# Patient Record
Sex: Female | Born: 1940
Health system: Southern US, Community
[De-identification: ages and names within clinical notes are randomized; demographics above are authoritative.]

## PROBLEM LIST (undated history)

## (undated) DIAGNOSIS — C801 Malignant (primary) neoplasm, unspecified: Secondary | ICD-10-CM

## (undated) DIAGNOSIS — K861 Other chronic pancreatitis: Secondary | ICD-10-CM

## (undated) DIAGNOSIS — K5900 Constipation, unspecified: Secondary | ICD-10-CM

## (undated) DIAGNOSIS — M8580 Other specified disorders of bone density and structure, unspecified site: Secondary | ICD-10-CM

## (undated) DIAGNOSIS — M1990 Unspecified osteoarthritis, unspecified site: Secondary | ICD-10-CM

## (undated) DIAGNOSIS — I4891 Unspecified atrial fibrillation: Secondary | ICD-10-CM

## (undated) DIAGNOSIS — K859 Acute pancreatitis without necrosis or infection, unspecified: Secondary | ICD-10-CM

## (undated) DIAGNOSIS — Z807 Family history of other malignant neoplasms of lymphoid, hematopoietic and related tissues: Secondary | ICD-10-CM

## (undated) HISTORY — PX: JOINT REPLACEMENT: SHX530

## (undated) HISTORY — DX: Constipation, unspecified: K59.00

## (undated) HISTORY — DX: Unspecified atrial fibrillation: I48.91

## (undated) HISTORY — DX: Unspecified osteoarthritis, unspecified site: M19.90

## (undated) HISTORY — DX: Other chronic pancreatitis: K86.1

## (undated) HISTORY — DX: Acute pancreatitis without necrosis or infection, unspecified: K85.90

## (undated) HISTORY — DX: Other specified disorders of bone density and structure, unspecified site: M85.80

## (undated) HISTORY — DX: Family history of other malignant neoplasms of lymphoid, hematopoietic and related tissues: Z80.7

---
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
## 1979-06-30 HISTORY — PX: CYSTECTOMY: SUR359

## 1988-06-29 HISTORY — PX: MANDIBLE FRACTURE SURGERY: SHX706

## 1996-06-29 HISTORY — PX: CHOLECYSTECTOMY: SHX55

## 2001-02-10 ENCOUNTER — Inpatient Hospital Stay (HOSPITAL_COMMUNITY): Admission: EM | Admit: 2001-02-10 | Discharge: 2001-02-13 | Payer: Self-pay | Admitting: Emergency Medicine

## 2001-02-18 ENCOUNTER — Encounter: Admission: RE | Admit: 2001-02-18 | Discharge: 2001-02-18 | Payer: Self-pay | Admitting: Family Medicine

## 2004-12-06 ENCOUNTER — Emergency Department (HOSPITAL_COMMUNITY): Admission: EM | Admit: 2004-12-06 | Discharge: 2004-12-06 | Payer: Self-pay | Admitting: Emergency Medicine

## 2007-09-14 ENCOUNTER — Other Ambulatory Visit: Admission: RE | Admit: 2007-09-14 | Discharge: 2007-09-14 | Payer: Self-pay | Admitting: Family Medicine

## 2007-10-05 ENCOUNTER — Encounter: Admission: RE | Admit: 2007-10-05 | Discharge: 2007-10-05 | Payer: Self-pay | Admitting: Gastroenterology

## 2007-10-27 ENCOUNTER — Ambulatory Visit (HOSPITAL_COMMUNITY): Admission: RE | Admit: 2007-10-27 | Discharge: 2007-10-27 | Payer: Self-pay | Admitting: Gastroenterology

## 2007-10-27 ENCOUNTER — Encounter: Payer: Self-pay | Admitting: Gastroenterology

## 2007-10-31 ENCOUNTER — Ambulatory Visit: Payer: Self-pay | Admitting: Gastroenterology

## 2007-11-18 ENCOUNTER — Encounter: Admission: RE | Admit: 2007-11-18 | Discharge: 2007-11-18 | Payer: Self-pay | Admitting: Gastroenterology

## 2008-02-23 ENCOUNTER — Encounter: Admission: RE | Admit: 2008-02-23 | Discharge: 2008-02-23 | Payer: Self-pay | Admitting: Gastroenterology

## 2008-10-26 ENCOUNTER — Encounter: Admission: RE | Admit: 2008-10-26 | Discharge: 2008-10-26 | Payer: Self-pay | Admitting: Gastroenterology

## 2010-07-20 ENCOUNTER — Encounter: Payer: Self-pay | Admitting: Gastroenterology

## 2010-11-14 NOTE — Consult Note (Signed)
NAMEMIAH, BOYE NO.:  [id number]   MEDICAL RECORD NO.:  [id number]          PATIENT TYPE:  EMS   LOCATION:  [facility]                         FACILITY:  [facility]   PHYSICIAN:  Vesta Mixer, M.D. DATE OF BIRTH:  11/25/1940   DATE OF CONSULTATION:  DATE OF DISCHARGE:                                   CONSULTATION   DATE OF EMERGENCY ROOM CONSULTATION:  December 07, 2004.   HISTORY:  Mrs. Swinson is a 70 year old female with a history of  palpitations in the past.  She otherwise does not have any specific cardiac  abnormalities.  She presented to the emergency room this morning with  palpitations.  She was found to have a rapid atrial fibrillation, and we are  asked to consult.   The patient received an IV bolus of Cardizem in the emergency room and  converted to sinus bradycardia.  She feels quite well and has not had any  episodes of chest pain or shortness of breath.   The patient describes having palpitations over the past several years.  The  palpitations that she describes sound typically like premature ventricular  contractions.  This episode was somewhat different in that it was sustained.  She awoke with this and did not have any chest pain, shortness of breath,  diaphoresis, syncope, or presyncope.  She came to the emergency room and was  found to have a rapid atrial fibrillation with a rapid ventricular response.  She was given 20 mg of IV Cardizem and converted to sinus bradycardia.  She  feels quite well now and has not had any problems.   CURRENT MEDICATIONS:  1.  Multivitamins once a day.  2.  Claritin once a day.   ALLERGIES:  No known drug allergies.   PAST MEDICAL HISTORY:  PVCs.   SOCIAL HISTORY:  The patient has a history of cigarette smoking in the  remote past.   FAMILY HISTORY:  Positive for hypertension.   REVIEW OF SYSTEMS:  Reviewed and is essentially negative.   PHYSICAL EXAMINATION:  GENERAL:  She is an elderly female in no  acute  distress.  VITAL SIGNS:  Her heart rate is 52.  Her blood pressure is 108/70.  HEENT:  2+ carotids.  She has no JVD, no thyromegaly.  LUNGS:  Clear to auscultation.  HEART:  Regular rate, S1 and S2, and is bradycardic.  She has a very soft,  systolic, outflow murmur.  ABDOMEN:  Good bowel sounds and is nontender.  EXTREMITIES:  No clubbing, cyanosis, or edema.  NEURO:  Nonfocal.   ELECTROCARDIOGRAM:  Her first EKG reveals atrial fibrillation with a rapid  ventricular response.  Her second EKG reveals sinus bradycardia with PVCs.  Otherwise, there are no ST or T-wave changes.   LABORATORY DATA:  Essentially negative.  Her point-of-care markers are  negative.  Her CBC and her Chem-7 are unremarkable.   IMPRESSION AND PLAN:  Atrial fibrillation:  The patient presents with  intermittent atrial fibrillation.  She converted quite easily with a  Cardizem bolus.  We will give her a prescription for  propranolol to use on  an as needed basis in case she has more episodes of atrial fibrillation.  Her heart rate is fairly slow, and I do not want to place her on a long-  acting beta blocker. I have asked her to call Dr. Lindell Spar office for a  followup appointment this week.  She is to call us back sooner if she has  any problems over the weekend.       PJN/MEDQ  D:  12/06/2004  T:  12/06/2004  Job:  [id number]   cc:   Dellis Anes. Idell Pickles, M.D.  [address]  [place]  [place] [address]  Fax: [phone]   Candace Cruise, M.D.

## 2010-11-14 NOTE — H&P (Signed)
[place]. [facility]  Patient:    [name], [name]                      MRN: [id number] Adm. Date:  [id number] Attending:  Tobin Chad Dictator:   Clance Boll, M.D. CC:         Urgent Care   History and Physical  DATE OF BIRTH:  05-10-1941  PRIMARY CARE PHYSICIAN:  Urgent Care.  PROBLEM LIST: 1. Acute dyspnea with palpitations. 2. Nausea and vomiting. 3. Intermittent bradycardia. 4. Hypokalemia. 5. External hemorrhoids with bright red blood. 6. Left shoulder pain with recent new medications (prednisone, Bextra, and    Vicodin). 7. Status post cholecystectomy.  HISTORY OF PRESENT ILLNESS:  This is a 70 year old white female who presents to the emergency room with complaint of gradual onset shortness of breath and light headedness at approximately 11:30 a.m. this morning associated with palpitations and diaphoresis. Worsening by exertion. Also with epigastric fullness and bloating. Denies chest pain. She called 911, and EMS noted normal EKG and left. The patient then drove with husband to [facility] [place]. She continued with worsening shortness of breath but also with increasing nausea and vomiting. EKG at Urgent Care noted frequent PVCs and possible ST depression in the inferior leads, and she was sent to [facility] for evaluation. She reports severe shortness of breath, nausea, light headedness, palpitations upon arrival to ED with epigastric "fullness" and "perhaps mild chest pressure" that was completely relieved with sublingual nitroglycerin x 1 and Phenergan 12.5 mg IV. She currently denies chest pain. The patient reports symptoms this morning were significantly worse with exertion and with laying flat. She denies cough, fevers or chills, diarrhea, or change in bowel movements. She reports epigastric bloating for the past several days and a history of intermittent bouts of shortness of breath that quickly  resolve on their own. No chest pain. No lower extremity swelling. No orthopnea. No PND.  PAST MEDICAL HISTORY:  See HPI.  PAST SURGICAL HISTORY:  Cholecystectomy.  CURRENT MEDICATIONS: 1. Prednisone 2. Bextra. 3. Vicodin. Her last dose yesterday afternoon. 4. Multivitamin. 5. Glucosamine. 6. MSM.  ALLERGIES:  None.  SOCIAL HISTORY:  She lives with her husband. Denies tobacco or alcohol or drug use. 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  FAMILY HISTORY:  Significant for her dad who died of Hodgkins lymphoma at 57 and multiple family members with hypertension. No early deaths from coronary disease. No early MIs.  REVIEW OF SYSTEMS:  She does note occasional bright red blood per rectum that she relates to her hemorrhoids. She did have a flexible sigmoidoscopy done five years ago. No melena. No dysuria. No abdominal pain. No back pain. No calf pain. No recent long trips. She does note nocturia, three to four times per night. No change in weight. No fevers or chills. No reflux.  PHYSICAL EXAMINATION:  VITAL SIGNS:  Temperature 96.5, pulse ranges from 45 to 66, blood pressure 100 to 120 systolic over 60 to 80 diastolic, respirations 18, O2 saturation is 98% on 2 L.  GENERAL:  On arrival, she was diaphoretic, pale, in moderate respiratory distress. After sublingual nitroglycerin x 1, she was sitting up, comfortable, interactive, in no distress.  HEENT:  PERRLA. EOMI. Nares patent. OP without erythema or exudate.  NECK:  Supple. No LAD. No TM. No JVD. No carotid bruits.  CARDIOVASCULAR:  Regular rate and rhythm.  No murmur, gallop, or rub.  LUNGS:  Clear to auscultation bilaterally with adequate aeration. No accessory muscle use.  ABDOMEN:  Soft, obese. ______ . No hepatosplenomegaly.  EXTREMITIES:  Trace edema to the shin bilaterally. There are 2+ PT and DP pulses bilaterally.  RECTAL:  Positive external hemorrhoids that are moderately  tender with trace bright red blood per rectum. Normal sphincter tone.  LABORATORY DATA:  White blood cell count 9.6, hemoglobin 13.7, hematocrit 39.4, platelets 212. Sodium 140, potassium 3.3, chloride 108, bicarb 24, BUN 20, creatinine 0.8, glucose 112, calcium 8.9, albumin 3.7, AST 29, ALT 27, alkaline phosphatase 62, total bilirubin 0.4. CK 93, MB 1.6, troponin I 0.01. ABG:  7.44, pCO2 33, pO2 94, bicarb 22, saturation 98% on room air.  EKG shows LAD with frequent PVCs. No acute ST or T wave changes.  Chest x-ray shows cardiomegaly and mild pulmonary edema.  ASSESSMENT AND PLAN:  This is a 70 year old white female with acute shortness of breath, palpitations, diaphoresis, and chest fullness.  1. Shortness of breath. Symptoms are concerning for ischemia especially since    it was relieved with sublingual nitroglycerin. However, she does have    normal enzymes and normal EKG. ABG demonstrates mild hyperventilation but    is otherwise normal. We will admit to telemetry. Rule out for myocardial    infarction. Start aspirin a day. Check D-dimer and lower extremity Dopplers    to rule out PE. The patient could have had fluid retention from prednisone    and Bextra. This may also represent an esophageal spasm or panic attack.    However, we will discontinued prednisone and Bextra in the meantime. 2. Bright red blood per rectum. Likely secondary to hemorrhoids. We will give    Protocort HC cream, Tucks wipes for comfort. The patient has had a flexible    sigmoidoscopy in the past five years and a normal hemoglobin. She can have    further GI workup as an outpatient if necessary because of her age. 3. Hypokalemia. Likely secondary to prednisone. We will give p.o. replacement.DD:  02/10/01 TD:  02/10/01 Job: [id number]

## 2010-11-14 NOTE — Consult Note (Signed)
[place]. [facility]  Patient:    [name], [name]                      MRN: [id number] Adm. Date:  [id number] Attending:  Tobin Chad                          Consultation Report  REFERRING PHYSICIAN:  [facility] Teaching Service.  REASON FOR CONSULTATION:  Acute shortness of breath associated with substernal chest pressure.  HISTORY OF PRESENT ILLNESS:  Ms. Gresham is a 70 year old female with no significant risk factors for coronary artery disease.  The patient was seen in the urgent care center today due to the rather sudden onset of shortness of breath and lightheadedness this morning.  This was associated with palpitations and some diaphoresis.  The patient later in the morning also then experienced chest tightness, which worsened her shortness of breath.  Of note, the patient has been under a lot of stress recently, particularly because of a bad report that her husband got regarding his kidneys.  However, the patient was concerned and called 911.  She was found to have a normal electrocardiogram by EMS.  Subsequently the patient presents herself to the urgent center at [facility].  She also reports nausea and vomiting, which was felt to be secondary to Vicodin, Bextra, and prednisone she had been using for osteoarthritis.  The patient was then referred to [facility] for further evaluation.  The patient is currently pain-free.  She reports lightheadedness when sitting up.  However, she has no shortness of breath at rest but does become dyspneic on minimal exertion.  She has no orthopnea or PND.  PAST MEDICAL HISTORY: 1. History of cholecystectomy. 2. History of decreased exercise tolerance. 3. History of left shoulder osteoarthritis, recently treated with prednisone,    Bextra, and Vicodin.  ALLERGIES:  No known drug allergies.  SOCIAL HISTORY:  She lives with her husband.  She does not drink or does not use tobacco.  She  quit in 1974.  FAMILY HISTORY:  Notable for hypertension.  MEDICATIONS:  Prednisone, Bextra, Vicodin, multivitamin.  REVIEW OF SYSTEMS:  As per HPI.  Decreased exercise tolerance, _____ class 2. Occasional palpitations, lightheadedness, but no frank syncope.  PHYSICAL EXAMINATION:  VITAL SIGNS:  Blood pressure is 120/60, heart rate 50-60 beats per minute, respirations are 18.  Room air saturation is 98%.  GENERAL:  A well-nourished white female in no apparent distress, somewhat pale-appearing.  HEENT:  Pupils isocoric.  Conjunctivae clear.  NECK:  Supple, no JVD or abdominal jugular reflux.  No carotid bruits.  CHEST:  Clear breath sounds bilaterally.  CARDIAC:  Regular rate and rhythm, bradycardic.  Normal S1 and S2.  No murmurs, rubs, or gallops.  ABDOMEN:  Soft, nontender, but distended with borborygmi.  EXTREMITIES:  2+ pulses.  There is no cyanosis, clubbing, or edema.  NEUROLOGIC:  The patient is alert, oriented, and grossly nonfocal.  LABORATORY DATA:  Hemoglobin is 13.7, white count is 9.6.  Potassium is 3.8, creatinine is 0.8, BUN is 20.  First CK is 93 with MB of 1.6, troponin 0.01. Second set, 110 with a CK-MB of 5.8 and a relative index of 5.3, troponin is 0.03.  D-dimer is 0.79.  Twelve-lead electrocardiogram:  Sinus bradycardia with left axis deviation but no acute ischemic changes, frequent PVCs.  Chest x-ray reported as cardiomegaly with mild congestive heart failure.  IMPRESSION AND PLAN: 1. Shortness of breath.  Patients symptoms of shortness of breath are rather    acute.  This could be well due to hyperventilation and the recent stress    she experienced in her life.  The patient has very few risk factors for    coronary artery disease.  However, her symptoms of substernal chest pain    and exertional dyspnea are concerning for ischemic heart disease.  If the    third set of troponins is noted for further elevation, I would recommend to    proceed  with a cardiac catheterization.  Furthermore, if her mild pulmonary    edema by chest x-ray is confirmed with an elevated BNP level, this would be    another indication to proceed with cardiac catheterization.  The patient is    very reluctant to do this, and I had a long discussion with her about the    risk and benefit of a cardiac catheterization.  However, she is in    agreement that if we have further evidence that she has an acute coronary    syndrome, she is planning to proceed with this procedure.  If enzymes    remain negative and her BNP level is below 100, I do feel we can proceed    with adenosine Cardiolite study in the morning.  I agree with aspirin and    Lovenox at this point in time.  If the patients ischemia is negative,    consideration could be given to a pulmonary embolism, although I think her    pre-test probability is rather low for significant pulmonary embolus.  I    agree that if she has negative lower extremity Dopplers, this would be an    unlikely presentation of pulmonary embolism. 2. Bradycardia.  This is likely a vagal reaction, particularly with the    patients complaints of nausea.  I am also not certain that the patient    does not have a gastritis or some other cause for gastrointestinal    pathology that is giving her vagal symptoms.  She should be treated with    simethicone as well as anitemetics for tonight.  DISPOSITION:  Will follow up results of cardiac enzymes and BNP, and decisions will be based on further resolution of the patients symptoms whether to proceed with the cardiac catheterization or a noninvasive stress test. DD:  02/11/01 TD:  02/11/01 Job: [id number]

## 2010-11-14 NOTE — Discharge Summary (Signed)
[place]. [facility]  Patient:    [name], [name] Visit Number: [id number] MRN: [id number]          Service Type: MED Location: 2000 2041 01 Attending Physician:  Tobin Chad Dictated by:   Dorcas Mcmurray, M.D. Adm. Date:  [id number] Disc. Date: [id number]                             Discharge Summary  DISCHARGE DIAGNOSES: 1. Epigastric discomfort and bloating initially associated with dyspnea with    concern for cardiac versus gastrointestinal etiology. 2. History of external hemorrhoids. 3. History of cholecystectomy. 4. Recent left shoulder pain. 5. Hypercholesterolemia with low-density lipoprotein 150s, high-density    lipoprotein in the 60s.  DISCHARGE MEDICATIONS: 1. Protonix 40 mg q.d. 2. Baby aspirin one q.d. 3. Nitroglycerin sublingual 0.4 mg as directed for chest pain. 4. Okay to continue glucosamine. 5. Multivitamin.  CONSULTATION:  [facility].  PROCEDURES: 1. A 2D echocardiogram revealed normal left ventricular systolic function with    ejection fraction 55-65%, some dyssynergic motion of intraventricular    septum, mild calcification of the aortic valve, mild aortic valve    regurgitation and mild dilation of the left atrium, otherwise normal study. 2. Attempted adenosine Cardiolite which was forced to be stopped due to the    patients claustrophobia and anxiety with the procedure.  HISTORY OF PRESENT ILLNESS:  This 70 year old female was admitted from Urgent Care after a gradual onset of shortness of breath which was associated with some palpitations and diaphoresis worsened by exertion.  The patient also had some epigastric fullness and bloating, but denied any chest pain.  Of note, she had recently been started on some prednisone, Bextra and Vicodin for some shoulder pain.  Symptomatology was worsened for cardiac etiology.  The patient was admitted for rule out and cardiac evaluation.  HOSPITAL COURSE:  #1 -  CARDIOVASCULAR:  The patient was admitted to telemetry and had some cardiac enzymes in which the second set was mildly elevated with a total CK of 110, MBs in the 5 range along with a mildly elevated BMP of 278 and elevated D-dimer of 0.79.  This was concerning for a possible cardiac etiology.  Cardiology recommended an echocardiogram which was performed (results above) and adenosine Cardiolite.  The patient was unable to tolerate this initial adenosine Cardiolite.  Thus, he was recommended for a Cardiolite with some sedation.  She and her husband refused this prompting them to sign out against medical advice.  Of note, the patients symptoms were essentially related to some abdominal fullness and bloating for the majority of her admission.  She denied any chest pain or palpitations.  Telemetry was normal except for the occasional episodes of bradycardia.  EKGs did not show any ischemic changes.  #2 - GASTROINTESTINAL:  The patient was also presumptively treated for a gastrointestinal origin of her symptoms particularly with starting of Bextra, prednisone and Vicodin.  She reported that Protonix was somewhat helpful and got some good relief on February 13, 2001, with a GI cocktail.  In addition, she was treated with Simethicone p.r.n.  Plan is for her to continue the Protonix for the next month or so.  If symptoms do not resolve, she is to consider a gastroenterology consult by her primary M.D.  #3 - HYPERCHOLESTEROLEMIA:  HDL was 153.  We did discuss aggressive risk factor modification including cholesterol lowering, exquisite blood pressure control and taking a  baby aspirin per day.  We discussed adequate low fat diet.  After an adequate stress test, initiation of an exercise program should be started.  The patient may also need initiation of cholesterol lowering medication at some point.  CONDITION ON DISCHARGE:  Stable.  The patient was recommended by the family practice teaching  service and the cardiology service to have an adenosine Cardiolite to rule out a cardiac etiology.  She was made clear that this was our recommendation and we would be unable to adequately assess cardiac status without it.  She and her husband expressed their clear understanding of this and were willing to sign out against medical advise knowing fully that the risk is that we may have an undiagnosed cardiac problem with potential serious consequences.  She and her husband expressed understanding of this.  FOLLOWUP:  The patient is to follow up with her primary care physician or Urgent Care in the next two weeks.  She was instructed if she develops any increase in chest pain, discomfort, nausea, vomiting, shortness of breath or other worsening symptoms she should seek medical care ASAP.  DISCHARGE LABORATORY DATA AND X-RAY FINDINGS:  Of note, H. pylori was negative.  Brain natriuretic protein was 278.  LDL cholesterol 153, HDL 67.DD: 02/13/01 TD:  02/14/01 Job: [id number]

## 2010-11-17 ENCOUNTER — Emergency Department (HOSPITAL_COMMUNITY)
Admission: EM | Admit: 2010-11-17 | Discharge: 2010-11-17 | Disposition: A | Discharge status: Home / Self Care | Payer: Medicare Other | Attending: Emergency Medicine | Admitting: Emergency Medicine

## 2010-11-17 DIAGNOSIS — R002 Palpitations: Secondary | ICD-10-CM | POA: Insufficient documentation

## 2010-11-17 DIAGNOSIS — I498 Other specified cardiac arrhythmias: Secondary | ICD-10-CM | POA: Insufficient documentation

## 2010-11-17 DIAGNOSIS — I4460 Unspecified fascicular block: Secondary | ICD-10-CM | POA: Insufficient documentation

## 2010-11-17 DIAGNOSIS — I4891 Unspecified atrial fibrillation: Secondary | ICD-10-CM | POA: Insufficient documentation

## 2010-11-17 LAB — POCT I-STAT, CHEM 8
BUN: 24 mg/dL — ABNORMAL HIGH (ref 6–23)
Chloride: 109 mEq/L (ref 96–112)
Creatinine, Ser: 1 mg/dL (ref 0.4–1.2)
Glucose, Bld: 113 mg/dL — ABNORMAL HIGH (ref 70–99)
Potassium: 3.9 mEq/L (ref 3.5–5.1)

## 2010-11-17 LAB — APTT: aPTT: 35 seconds (ref 24–37)

## 2010-11-17 LAB — PROTIME-INR
INR: 0.95 (ref 0.00–1.49)
Prothrombin Time: 12.9 seconds (ref 11.6–15.2)

## 2010-11-17 LAB — CBC
HCT: 42.2 % (ref 36.0–46.0)
Hemoglobin: 14.8 g/dL (ref 12.0–15.0)
MCHC: 35.1 g/dL (ref 30.0–36.0)
MCV: 79 fL (ref 78.0–100.0)
RDW: 13.9 % (ref 11.5–15.5)

## 2010-11-17 LAB — DIFFERENTIAL
Eosinophils Relative: 3 % (ref 0–5)
Lymphocytes Relative: 38 % (ref 12–46)
Lymphs Abs: 2.4 10*3/uL (ref 0.7–4.0)
Monocytes Absolute: 0.6 10*3/uL (ref 0.1–1.0)

## 2010-11-17 LAB — BASIC METABOLIC PANEL
BUN: 21 mg/dL (ref 6–23)
Calcium: 9.5 mg/dL (ref 8.4–10.5)
GFR calc non Af Amer: >60 mL/min (ref >60)
Potassium: 3.7 mEq/L (ref 3.5–5.1)
Sodium: 140 mEq/L (ref 135–145)

## 2010-11-17 LAB — T4: T4, Total: 9 ug/dL (ref 5.0–12.5)

## 2010-11-17 LAB — POCT CARDIAC MARKERS: CKMB, poc: <1 ng/mL — ABNORMAL LOW (ref 1.0–8.0)

## 2010-11-18 NOTE — Consult Note (Signed)
[name]               ACCOUNT NO.:  [id number]  MEDICAL RECORD NO.:  [id number]           PATIENT TYPE:  E  LOCATION:  [facility]                         FACILITY:  [facility]  PHYSICIAN:  Georga Hacking, M.D.DATE OF BIRTH:  [birth year]  DATE OF CONSULTATION:  11/17/2010                                 CONSULTATION   REASON FOR CONSULTATION:  Atrial fibrillation.  I was asked to see this 70 year old female for evaluation of paroxysmal atrial fibrillation by the emergency room physician.  She has a history of paroxysmal atrial fibrillation, evidently saw a cardiologist in [place] or [place] who died several years ago.  She previously been treated only with aspirin and with intermittent propranolol.  She has had brief episodes of atrial fibrillation, but has not had significant symptomatology, but was awakened from sleep around 2 a.m. with rapid atrial fibrillation since she was shortness of breath. She presented to the emergency room, was found to be in rapid atrial fibrillation, was treated initially with IV Cardizem, and later converted to normal sinus rhythm.  She has not had any angina and does not get much in the way of exercise.  She denies PND, orthopnea, syncope, palpitations, or claudication.  Does not get much exercise.  PAST MEDICAL HISTORY:  Remarkable for obesity.  She has a history of mild chronic pancreatitis.  She does not have hypertension or diabetes and no history of previous GI bleeding or stroke.  PAST SURGICAL HISTORY:  Shoulder replacement, gallbladder operation, appendectomy, tonsillectomy, and cyst removed from jaw.  ALLERGIES:  CEPHALEXIN.  CURRENT MEDICATIONS:  Aspirin occasionally, fish oil.  FAMILY HISTORY:  Negative for premature cardiac disease.  SOCIAL HISTORY:  She has been married for 47 years.  She is a retired Film/video editor.  Denies alcohol use.  She quit smoking years ago.  REVIEW OF SYSTEMS:  She has been obese for many  years.  No skin problems.  She has had previous cataract surgery.  No other eye problems.  No glaucoma.  No difficulty swallowing.  Does have occasional dyspepsia.  No constipation or diarrhea.  Occasional urinary symptoms. She complains of generalized arthritis.  No stroke or TIA.  No significant headache.  Other than as noted, above, the remainder of review of systems is unremarkable.  PHYSICAL EXAMINATION:  GENERAL:  A pleasant elderly obese female in no acute distress. VITAL SIGNS:  Blood pressure currently 120/70, pulse currently 57 and regular now that she is converted to sinus rhythm following Cardizem. SKIN:  Warm and dry without obvious mass or lesions. ENT:  EOMI.  PERRLA.  C and S clear.  Fundi not examined.  Pharynx negative. NECK:  Supple without masses, JVD, thyromegaly, or bruits. LUNGS:  Clear to A and P. CARDIAC:  Normal S1 and S2.  No S3 or murmur. ABDOMEN:  Soft and nontender.  No masses, hepatosplenomegaly, or aneurysm.  Femoral distal pulses present, 2+.  EKG shows left axis deviation, incomplete right bundle-branch block.  LABORATORY DATA:  Hemoglobin of 15.2, hematocrit of 45.  Glucose is 113, BUN is 24, creatinine is 1.  Enzymes were negative.  IMPRESSION: 1. Paroxysmal  atrial fibrillation, resolved. 2. Obesity. 3. Left axis deviation. 4. Osteoarthritis.  RECOMMENDATIONS:  Her CHADS score is 0.  She is physically inactive.  I have recommended that she began atenolol 25 mg daily and that she come to the office on Wednesday for an echocardiogram as well as 30-day event monitoring.  She will have a TSH drawn.     Georga Hacking, M.D.     WST/MEDQ  D:  11/17/2010  T:  11/17/2010  Job:  [id number]  cc:   C. Duane Lope, M.D.  Electronically Signed by Lacretia Nicks. Donnie Aho M.D. on 11/18/2010 01:08:26 PM

## 2011-03-24 LAB — BASIC METABOLIC PANEL
CO2: 28
Calcium: 9.1
Creatinine, Ser: 0.75
GFR calc non Af Amer: >60
Glucose, Bld: 110 — ABNORMAL HIGH
Sodium: 142

## 2011-03-30 DIAGNOSIS — R55 Syncope and collapse: Secondary | ICD-10-CM

## 2011-03-30 HISTORY — DX: Syncope and collapse: R55

## 2011-09-17 DIAGNOSIS — E785 Hyperlipidemia, unspecified: Secondary | ICD-10-CM | POA: Insufficient documentation

## 2011-09-17 DIAGNOSIS — I4891 Unspecified atrial fibrillation: Secondary | ICD-10-CM | POA: Insufficient documentation

## 2011-10-23 DIAGNOSIS — L821 Other seborrheic keratosis: Secondary | ICD-10-CM | POA: Diagnosis not present

## 2011-10-23 DIAGNOSIS — L259 Unspecified contact dermatitis, unspecified cause: Secondary | ICD-10-CM | POA: Diagnosis not present

## 2011-12-29 DIAGNOSIS — M999 Biomechanical lesion, unspecified: Secondary | ICD-10-CM | POA: Diagnosis not present

## 2011-12-29 DIAGNOSIS — M5137 Other intervertebral disc degeneration, lumbosacral region: Secondary | ICD-10-CM | POA: Diagnosis not present

## 2011-12-29 DIAGNOSIS — M5430 Sciatica, unspecified side: Secondary | ICD-10-CM | POA: Diagnosis not present

## 2012-01-04 DIAGNOSIS — M999 Biomechanical lesion, unspecified: Secondary | ICD-10-CM | POA: Diagnosis not present

## 2012-01-04 DIAGNOSIS — M5430 Sciatica, unspecified side: Secondary | ICD-10-CM | POA: Diagnosis not present

## 2012-01-04 DIAGNOSIS — M5137 Other intervertebral disc degeneration, lumbosacral region: Secondary | ICD-10-CM | POA: Diagnosis not present

## 2012-01-05 DIAGNOSIS — M999 Biomechanical lesion, unspecified: Secondary | ICD-10-CM | POA: Diagnosis not present

## 2012-01-05 DIAGNOSIS — M5137 Other intervertebral disc degeneration, lumbosacral region: Secondary | ICD-10-CM | POA: Diagnosis not present

## 2012-01-05 DIAGNOSIS — M5430 Sciatica, unspecified side: Secondary | ICD-10-CM | POA: Diagnosis not present

## 2012-01-06 DIAGNOSIS — R609 Edema, unspecified: Secondary | ICD-10-CM | POA: Diagnosis not present

## 2012-01-06 DIAGNOSIS — R0602 Shortness of breath: Secondary | ICD-10-CM | POA: Diagnosis not present

## 2012-01-07 DIAGNOSIS — M5430 Sciatica, unspecified side: Secondary | ICD-10-CM | POA: Diagnosis not present

## 2012-01-07 DIAGNOSIS — M999 Biomechanical lesion, unspecified: Secondary | ICD-10-CM | POA: Diagnosis not present

## 2012-01-07 DIAGNOSIS — M5137 Other intervertebral disc degeneration, lumbosacral region: Secondary | ICD-10-CM | POA: Diagnosis not present

## 2012-01-11 DIAGNOSIS — M5137 Other intervertebral disc degeneration, lumbosacral region: Secondary | ICD-10-CM | POA: Diagnosis not present

## 2012-01-11 DIAGNOSIS — M999 Biomechanical lesion, unspecified: Secondary | ICD-10-CM | POA: Diagnosis not present

## 2012-01-11 DIAGNOSIS — M5430 Sciatica, unspecified side: Secondary | ICD-10-CM | POA: Diagnosis not present

## 2012-01-14 DIAGNOSIS — M999 Biomechanical lesion, unspecified: Secondary | ICD-10-CM | POA: Diagnosis not present

## 2012-01-14 DIAGNOSIS — M5137 Other intervertebral disc degeneration, lumbosacral region: Secondary | ICD-10-CM | POA: Diagnosis not present

## 2012-01-14 DIAGNOSIS — M5430 Sciatica, unspecified side: Secondary | ICD-10-CM | POA: Diagnosis not present

## 2012-01-19 DIAGNOSIS — M999 Biomechanical lesion, unspecified: Secondary | ICD-10-CM | POA: Diagnosis not present

## 2012-01-19 DIAGNOSIS — M5137 Other intervertebral disc degeneration, lumbosacral region: Secondary | ICD-10-CM | POA: Diagnosis not present

## 2012-01-19 DIAGNOSIS — M5430 Sciatica, unspecified side: Secondary | ICD-10-CM | POA: Diagnosis not present

## 2012-01-21 DIAGNOSIS — M25569 Pain in unspecified knee: Secondary | ICD-10-CM | POA: Diagnosis not present

## 2012-01-23 DIAGNOSIS — M1710 Unilateral primary osteoarthritis, unspecified knee: Secondary | ICD-10-CM | POA: Diagnosis not present

## 2012-01-23 DIAGNOSIS — M23329 Other meniscus derangements, posterior horn of medial meniscus, unspecified knee: Secondary | ICD-10-CM | POA: Diagnosis not present

## 2012-01-25 DIAGNOSIS — M5430 Sciatica, unspecified side: Secondary | ICD-10-CM | POA: Diagnosis not present

## 2012-01-25 DIAGNOSIS — M5137 Other intervertebral disc degeneration, lumbosacral region: Secondary | ICD-10-CM | POA: Diagnosis not present

## 2012-01-25 DIAGNOSIS — M999 Biomechanical lesion, unspecified: Secondary | ICD-10-CM | POA: Diagnosis not present

## 2012-01-26 DIAGNOSIS — I4891 Unspecified atrial fibrillation: Secondary | ICD-10-CM | POA: Diagnosis not present

## 2012-01-26 DIAGNOSIS — R0602 Shortness of breath: Secondary | ICD-10-CM | POA: Diagnosis not present

## 2012-01-26 DIAGNOSIS — R609 Edema, unspecified: Secondary | ICD-10-CM | POA: Diagnosis not present

## 2012-01-27 DIAGNOSIS — M5430 Sciatica, unspecified side: Secondary | ICD-10-CM | POA: Diagnosis not present

## 2012-01-27 DIAGNOSIS — M5137 Other intervertebral disc degeneration, lumbosacral region: Secondary | ICD-10-CM | POA: Diagnosis not present

## 2012-01-27 DIAGNOSIS — M999 Biomechanical lesion, unspecified: Secondary | ICD-10-CM | POA: Diagnosis not present

## 2012-01-28 DIAGNOSIS — M1710 Unilateral primary osteoarthritis, unspecified knee: Secondary | ICD-10-CM | POA: Diagnosis not present

## 2012-01-28 DIAGNOSIS — M9420 Chondromalacia, unspecified site: Secondary | ICD-10-CM | POA: Diagnosis not present

## 2012-01-28 DIAGNOSIS — IMO0002 Reserved for concepts with insufficient information to code with codable children: Secondary | ICD-10-CM | POA: Diagnosis not present

## 2012-02-01 DIAGNOSIS — M5430 Sciatica, unspecified side: Secondary | ICD-10-CM | POA: Diagnosis not present

## 2012-02-01 DIAGNOSIS — M999 Biomechanical lesion, unspecified: Secondary | ICD-10-CM | POA: Diagnosis not present

## 2012-02-01 DIAGNOSIS — M5137 Other intervertebral disc degeneration, lumbosacral region: Secondary | ICD-10-CM | POA: Diagnosis not present

## 2012-02-08 DIAGNOSIS — M999 Biomechanical lesion, unspecified: Secondary | ICD-10-CM | POA: Diagnosis not present

## 2012-02-08 DIAGNOSIS — M5137 Other intervertebral disc degeneration, lumbosacral region: Secondary | ICD-10-CM | POA: Diagnosis not present

## 2012-02-08 DIAGNOSIS — M5430 Sciatica, unspecified side: Secondary | ICD-10-CM | POA: Diagnosis not present

## 2012-02-15 DIAGNOSIS — M999 Biomechanical lesion, unspecified: Secondary | ICD-10-CM | POA: Diagnosis not present

## 2012-02-15 DIAGNOSIS — M5137 Other intervertebral disc degeneration, lumbosacral region: Secondary | ICD-10-CM | POA: Diagnosis not present

## 2012-02-15 DIAGNOSIS — M5430 Sciatica, unspecified side: Secondary | ICD-10-CM | POA: Diagnosis not present

## 2012-02-16 DIAGNOSIS — M25569 Pain in unspecified knee: Secondary | ICD-10-CM | POA: Diagnosis not present

## 2012-02-22 DIAGNOSIS — M5137 Other intervertebral disc degeneration, lumbosacral region: Secondary | ICD-10-CM | POA: Diagnosis not present

## 2012-02-22 DIAGNOSIS — M999 Biomechanical lesion, unspecified: Secondary | ICD-10-CM | POA: Diagnosis not present

## 2012-02-25 ENCOUNTER — Ambulatory Visit: Payer: Medicare Other | Attending: Orthopaedic Surgery

## 2012-02-25 DIAGNOSIS — IMO0001 Reserved for inherently not codable concepts without codable children: Secondary | ICD-10-CM | POA: Diagnosis not present

## 2012-02-25 DIAGNOSIS — M6281 Muscle weakness (generalized): Secondary | ICD-10-CM | POA: Diagnosis not present

## 2012-02-25 DIAGNOSIS — M25569 Pain in unspecified knee: Secondary | ICD-10-CM | POA: Insufficient documentation

## 2012-02-25 DIAGNOSIS — R262 Difficulty in walking, not elsewhere classified: Secondary | ICD-10-CM | POA: Diagnosis not present

## 2012-03-02 ENCOUNTER — Ambulatory Visit: Payer: Medicare Other | Attending: Orthopaedic Surgery | Admitting: Rehabilitation

## 2012-03-02 DIAGNOSIS — M25569 Pain in unspecified knee: Secondary | ICD-10-CM | POA: Insufficient documentation

## 2012-03-02 DIAGNOSIS — IMO0001 Reserved for inherently not codable concepts without codable children: Secondary | ICD-10-CM | POA: Diagnosis not present

## 2012-03-02 DIAGNOSIS — R262 Difficulty in walking, not elsewhere classified: Secondary | ICD-10-CM | POA: Insufficient documentation

## 2012-03-02 DIAGNOSIS — M6281 Muscle weakness (generalized): Secondary | ICD-10-CM | POA: Diagnosis not present

## 2012-03-07 ENCOUNTER — Ambulatory Visit: Payer: Medicare Other | Admitting: Physical Therapy

## 2012-03-09 ENCOUNTER — Ambulatory Visit: Payer: Medicare Other | Admitting: Physical Therapy

## 2012-03-11 ENCOUNTER — Ambulatory Visit: Payer: Medicare Other

## 2012-03-14 ENCOUNTER — Ambulatory Visit: Payer: Medicare Other | Admitting: Physical Therapy

## 2012-03-16 ENCOUNTER — Ambulatory Visit: Payer: Medicare Other | Admitting: Physical Therapy

## 2012-03-21 ENCOUNTER — Ambulatory Visit: Payer: Medicare Other

## 2012-03-23 ENCOUNTER — Ambulatory Visit: Payer: Medicare Other | Admitting: Physical Therapy

## 2012-04-08 DIAGNOSIS — Z23 Encounter for immunization: Secondary | ICD-10-CM | POA: Diagnosis not present

## 2012-04-08 DIAGNOSIS — Z01419 Encounter for gynecological examination (general) (routine) without abnormal findings: Secondary | ICD-10-CM | POA: Diagnosis not present

## 2012-04-08 DIAGNOSIS — Z1331 Encounter for screening for depression: Secondary | ICD-10-CM | POA: Diagnosis not present

## 2012-04-08 DIAGNOSIS — M899 Disorder of bone, unspecified: Secondary | ICD-10-CM | POA: Diagnosis not present

## 2012-04-08 DIAGNOSIS — I4891 Unspecified atrial fibrillation: Secondary | ICD-10-CM | POA: Diagnosis not present

## 2012-04-08 DIAGNOSIS — Z131 Encounter for screening for diabetes mellitus: Secondary | ICD-10-CM | POA: Diagnosis not present

## 2012-04-08 DIAGNOSIS — E669 Obesity, unspecified: Secondary | ICD-10-CM | POA: Diagnosis not present

## 2012-04-08 DIAGNOSIS — Z0000 Encounter for general adult medical examination without abnormal findings: Secondary | ICD-10-CM | POA: Diagnosis not present

## 2012-04-18 DIAGNOSIS — M5137 Other intervertebral disc degeneration, lumbosacral region: Secondary | ICD-10-CM | POA: Diagnosis not present

## 2012-04-18 DIAGNOSIS — M999 Biomechanical lesion, unspecified: Secondary | ICD-10-CM | POA: Diagnosis not present

## 2012-05-02 DIAGNOSIS — M5137 Other intervertebral disc degeneration, lumbosacral region: Secondary | ICD-10-CM | POA: Diagnosis not present

## 2012-05-02 DIAGNOSIS — M999 Biomechanical lesion, unspecified: Secondary | ICD-10-CM | POA: Diagnosis not present

## 2012-05-18 DIAGNOSIS — M5137 Other intervertebral disc degeneration, lumbosacral region: Secondary | ICD-10-CM | POA: Diagnosis not present

## 2012-05-18 DIAGNOSIS — M999 Biomechanical lesion, unspecified: Secondary | ICD-10-CM | POA: Diagnosis not present

## 2012-06-08 DIAGNOSIS — H40009 Preglaucoma, unspecified, unspecified eye: Secondary | ICD-10-CM | POA: Diagnosis not present

## 2012-06-08 DIAGNOSIS — H02839 Dermatochalasis of unspecified eye, unspecified eyelid: Secondary | ICD-10-CM | POA: Diagnosis not present

## 2012-07-22 DIAGNOSIS — H02839 Dermatochalasis of unspecified eye, unspecified eyelid: Secondary | ICD-10-CM | POA: Insufficient documentation

## 2012-08-02 DIAGNOSIS — M25579 Pain in unspecified ankle and joints of unspecified foot: Secondary | ICD-10-CM | POA: Diagnosis not present

## 2012-08-17 DIAGNOSIS — M999 Biomechanical lesion, unspecified: Secondary | ICD-10-CM | POA: Diagnosis not present

## 2012-08-17 DIAGNOSIS — M5137 Other intervertebral disc degeneration, lumbosacral region: Secondary | ICD-10-CM | POA: Diagnosis not present

## 2012-08-17 DIAGNOSIS — M5430 Sciatica, unspecified side: Secondary | ICD-10-CM | POA: Diagnosis not present

## 2012-08-22 DIAGNOSIS — M5430 Sciatica, unspecified side: Secondary | ICD-10-CM | POA: Diagnosis not present

## 2012-08-22 DIAGNOSIS — M999 Biomechanical lesion, unspecified: Secondary | ICD-10-CM | POA: Diagnosis not present

## 2012-08-22 DIAGNOSIS — M5137 Other intervertebral disc degeneration, lumbosacral region: Secondary | ICD-10-CM | POA: Diagnosis not present

## 2012-08-24 DIAGNOSIS — M25579 Pain in unspecified ankle and joints of unspecified foot: Secondary | ICD-10-CM | POA: Diagnosis not present

## 2012-08-24 DIAGNOSIS — M79609 Pain in unspecified limb: Secondary | ICD-10-CM | POA: Diagnosis not present

## 2012-08-25 DIAGNOSIS — M999 Biomechanical lesion, unspecified: Secondary | ICD-10-CM | POA: Diagnosis not present

## 2012-08-25 DIAGNOSIS — M5137 Other intervertebral disc degeneration, lumbosacral region: Secondary | ICD-10-CM | POA: Diagnosis not present

## 2012-08-25 DIAGNOSIS — M5430 Sciatica, unspecified side: Secondary | ICD-10-CM | POA: Diagnosis not present

## 2012-08-29 DIAGNOSIS — M999 Biomechanical lesion, unspecified: Secondary | ICD-10-CM | POA: Diagnosis not present

## 2012-08-29 DIAGNOSIS — M5137 Other intervertebral disc degeneration, lumbosacral region: Secondary | ICD-10-CM | POA: Diagnosis not present

## 2012-08-29 DIAGNOSIS — M5430 Sciatica, unspecified side: Secondary | ICD-10-CM | POA: Diagnosis not present

## 2012-09-01 DIAGNOSIS — M999 Biomechanical lesion, unspecified: Secondary | ICD-10-CM | POA: Diagnosis not present

## 2012-09-01 DIAGNOSIS — M5430 Sciatica, unspecified side: Secondary | ICD-10-CM | POA: Diagnosis not present

## 2012-09-01 DIAGNOSIS — M5137 Other intervertebral disc degeneration, lumbosacral region: Secondary | ICD-10-CM | POA: Diagnosis not present

## 2012-09-05 DIAGNOSIS — M5430 Sciatica, unspecified side: Secondary | ICD-10-CM | POA: Diagnosis not present

## 2012-09-05 DIAGNOSIS — M5137 Other intervertebral disc degeneration, lumbosacral region: Secondary | ICD-10-CM | POA: Diagnosis not present

## 2012-09-05 DIAGNOSIS — M999 Biomechanical lesion, unspecified: Secondary | ICD-10-CM | POA: Diagnosis not present

## 2012-09-15 DIAGNOSIS — M5137 Other intervertebral disc degeneration, lumbosacral region: Secondary | ICD-10-CM | POA: Diagnosis not present

## 2012-09-15 DIAGNOSIS — M999 Biomechanical lesion, unspecified: Secondary | ICD-10-CM | POA: Diagnosis not present

## 2012-09-15 DIAGNOSIS — M5430 Sciatica, unspecified side: Secondary | ICD-10-CM | POA: Diagnosis not present

## 2012-09-30 DIAGNOSIS — H02839 Dermatochalasis of unspecified eye, unspecified eyelid: Secondary | ICD-10-CM | POA: Diagnosis not present

## 2012-09-30 DIAGNOSIS — H5340 Unspecified visual field defects: Secondary | ICD-10-CM | POA: Diagnosis not present

## 2012-11-01 DIAGNOSIS — R918 Other nonspecific abnormal finding of lung field: Secondary | ICD-10-CM | POA: Diagnosis not present

## 2012-11-01 DIAGNOSIS — Z9109 Other allergy status, other than to drugs and biological substances: Secondary | ICD-10-CM | POA: Diagnosis not present

## 2012-11-01 DIAGNOSIS — Z791 Long term (current) use of non-steroidal anti-inflammatories (NSAID): Secondary | ICD-10-CM | POA: Diagnosis not present

## 2012-11-01 DIAGNOSIS — R0602 Shortness of breath: Secondary | ICD-10-CM | POA: Diagnosis not present

## 2012-11-01 DIAGNOSIS — I4891 Unspecified atrial fibrillation: Secondary | ICD-10-CM | POA: Diagnosis not present

## 2012-11-01 DIAGNOSIS — I499 Cardiac arrhythmia, unspecified: Secondary | ICD-10-CM | POA: Diagnosis not present

## 2012-11-01 DIAGNOSIS — Z87891 Personal history of nicotine dependence: Secondary | ICD-10-CM | POA: Diagnosis not present

## 2012-11-01 DIAGNOSIS — R002 Palpitations: Secondary | ICD-10-CM | POA: Diagnosis not present

## 2012-11-01 DIAGNOSIS — Z881 Allergy status to other antibiotic agents status: Secondary | ICD-10-CM | POA: Diagnosis not present

## 2012-11-23 DIAGNOSIS — I4891 Unspecified atrial fibrillation: Secondary | ICD-10-CM | POA: Diagnosis not present

## 2012-11-29 DIAGNOSIS — M79609 Pain in unspecified limb: Secondary | ICD-10-CM | POA: Diagnosis not present

## 2012-11-29 DIAGNOSIS — M25579 Pain in unspecified ankle and joints of unspecified foot: Secondary | ICD-10-CM | POA: Diagnosis not present

## 2012-12-20 DIAGNOSIS — M25579 Pain in unspecified ankle and joints of unspecified foot: Secondary | ICD-10-CM | POA: Diagnosis not present

## 2012-12-20 DIAGNOSIS — M79609 Pain in unspecified limb: Secondary | ICD-10-CM | POA: Diagnosis not present

## 2013-01-17 DIAGNOSIS — M25579 Pain in unspecified ankle and joints of unspecified foot: Secondary | ICD-10-CM | POA: Diagnosis not present

## 2013-01-17 DIAGNOSIS — M79609 Pain in unspecified limb: Secondary | ICD-10-CM | POA: Diagnosis not present

## 2013-02-14 DIAGNOSIS — M79609 Pain in unspecified limb: Secondary | ICD-10-CM | POA: Diagnosis not present

## 2013-02-14 DIAGNOSIS — M25579 Pain in unspecified ankle and joints of unspecified foot: Secondary | ICD-10-CM | POA: Diagnosis not present

## 2013-03-29 DIAGNOSIS — IMO0002 Reserved for concepts with insufficient information to code with codable children: Secondary | ICD-10-CM | POA: Diagnosis not present

## 2013-04-10 DIAGNOSIS — Z0000 Encounter for general adult medical examination without abnormal findings: Secondary | ICD-10-CM | POA: Diagnosis not present

## 2013-04-10 DIAGNOSIS — E781 Pure hyperglyceridemia: Secondary | ICD-10-CM | POA: Diagnosis not present

## 2013-04-10 DIAGNOSIS — E669 Obesity, unspecified: Secondary | ICD-10-CM | POA: Diagnosis not present

## 2013-04-10 DIAGNOSIS — M899 Disorder of bone, unspecified: Secondary | ICD-10-CM | POA: Diagnosis not present

## 2013-04-10 DIAGNOSIS — I4891 Unspecified atrial fibrillation: Secondary | ICD-10-CM | POA: Diagnosis not present

## 2013-04-10 DIAGNOSIS — Z23 Encounter for immunization: Secondary | ICD-10-CM | POA: Diagnosis not present

## 2013-04-11 DIAGNOSIS — M79609 Pain in unspecified limb: Secondary | ICD-10-CM | POA: Diagnosis not present

## 2013-04-11 DIAGNOSIS — M659 Synovitis and tenosynovitis, unspecified: Secondary | ICD-10-CM | POA: Diagnosis not present

## 2013-05-31 DIAGNOSIS — M899 Disorder of bone, unspecified: Secondary | ICD-10-CM | POA: Diagnosis not present

## 2013-06-14 DIAGNOSIS — H259 Unspecified age-related cataract: Secondary | ICD-10-CM | POA: Diagnosis not present

## 2013-06-14 DIAGNOSIS — H409 Unspecified glaucoma: Secondary | ICD-10-CM | POA: Diagnosis not present

## 2013-06-14 DIAGNOSIS — H40009 Preglaucoma, unspecified, unspecified eye: Secondary | ICD-10-CM | POA: Diagnosis not present

## 2013-06-14 DIAGNOSIS — H4010X0 Unspecified open-angle glaucoma, stage unspecified: Secondary | ICD-10-CM | POA: Diagnosis not present

## 2013-06-15 DIAGNOSIS — I4891 Unspecified atrial fibrillation: Secondary | ICD-10-CM | POA: Diagnosis not present

## 2013-07-19 DIAGNOSIS — H409 Unspecified glaucoma: Secondary | ICD-10-CM | POA: Diagnosis not present

## 2013-08-23 DIAGNOSIS — H40009 Preglaucoma, unspecified, unspecified eye: Secondary | ICD-10-CM | POA: Diagnosis not present

## 2013-08-23 DIAGNOSIS — H409 Unspecified glaucoma: Secondary | ICD-10-CM | POA: Diagnosis not present

## 2013-08-23 DIAGNOSIS — H4010X0 Unspecified open-angle glaucoma, stage unspecified: Secondary | ICD-10-CM | POA: Diagnosis not present

## 2013-10-30 DIAGNOSIS — K861 Other chronic pancreatitis: Secondary | ICD-10-CM | POA: Diagnosis not present

## 2013-10-30 DIAGNOSIS — E669 Obesity, unspecified: Secondary | ICD-10-CM | POA: Diagnosis not present

## 2013-10-30 DIAGNOSIS — M1990 Unspecified osteoarthritis, unspecified site: Secondary | ICD-10-CM | POA: Diagnosis not present

## 2013-10-30 DIAGNOSIS — Z23 Encounter for immunization: Secondary | ICD-10-CM | POA: Diagnosis not present

## 2013-10-30 DIAGNOSIS — I4891 Unspecified atrial fibrillation: Secondary | ICD-10-CM | POA: Diagnosis not present

## 2013-10-30 DIAGNOSIS — M949 Disorder of cartilage, unspecified: Secondary | ICD-10-CM | POA: Diagnosis not present

## 2013-10-30 DIAGNOSIS — M899 Disorder of bone, unspecified: Secondary | ICD-10-CM | POA: Diagnosis not present

## 2013-11-03 DIAGNOSIS — R609 Edema, unspecified: Secondary | ICD-10-CM | POA: Diagnosis not present

## 2013-11-03 DIAGNOSIS — L2089 Other atopic dermatitis: Secondary | ICD-10-CM | POA: Diagnosis not present

## 2014-02-07 DIAGNOSIS — H4011X0 Primary open-angle glaucoma, stage unspecified: Secondary | ICD-10-CM | POA: Diagnosis not present

## 2014-02-07 DIAGNOSIS — H409 Unspecified glaucoma: Secondary | ICD-10-CM | POA: Diagnosis not present

## 2014-02-22 DIAGNOSIS — L259 Unspecified contact dermatitis, unspecified cause: Secondary | ICD-10-CM | POA: Diagnosis not present

## 2014-02-22 DIAGNOSIS — L219 Seborrheic dermatitis, unspecified: Secondary | ICD-10-CM | POA: Diagnosis not present

## 2014-04-11 DIAGNOSIS — M9903 Segmental and somatic dysfunction of lumbar region: Secondary | ICD-10-CM | POA: Diagnosis not present

## 2014-04-11 DIAGNOSIS — M5136 Other intervertebral disc degeneration, lumbar region: Secondary | ICD-10-CM | POA: Diagnosis not present

## 2014-04-16 DIAGNOSIS — M9903 Segmental and somatic dysfunction of lumbar region: Secondary | ICD-10-CM | POA: Diagnosis not present

## 2014-04-16 DIAGNOSIS — M5136 Other intervertebral disc degeneration, lumbar region: Secondary | ICD-10-CM | POA: Diagnosis not present

## 2014-04-18 DIAGNOSIS — M25512 Pain in left shoulder: Secondary | ICD-10-CM | POA: Diagnosis not present

## 2014-04-18 DIAGNOSIS — M7521 Bicipital tendinitis, right shoulder: Secondary | ICD-10-CM | POA: Diagnosis not present

## 2014-04-18 DIAGNOSIS — M19012 Primary osteoarthritis, left shoulder: Secondary | ICD-10-CM | POA: Diagnosis not present

## 2014-04-18 DIAGNOSIS — M25511 Pain in right shoulder: Secondary | ICD-10-CM | POA: Diagnosis not present

## 2014-04-23 DIAGNOSIS — M9903 Segmental and somatic dysfunction of lumbar region: Secondary | ICD-10-CM | POA: Diagnosis not present

## 2014-04-23 DIAGNOSIS — M5136 Other intervertebral disc degeneration, lumbar region: Secondary | ICD-10-CM | POA: Diagnosis not present

## 2014-04-30 DIAGNOSIS — M7521 Bicipital tendinitis, right shoulder: Secondary | ICD-10-CM | POA: Diagnosis not present

## 2014-05-07 DIAGNOSIS — M5136 Other intervertebral disc degeneration, lumbar region: Secondary | ICD-10-CM | POA: Diagnosis not present

## 2014-05-07 DIAGNOSIS — Z23 Encounter for immunization: Secondary | ICD-10-CM | POA: Diagnosis not present

## 2014-05-07 DIAGNOSIS — M8580 Other specified disorders of bone density and structure, unspecified site: Secondary | ICD-10-CM | POA: Diagnosis not present

## 2014-05-07 DIAGNOSIS — Z131 Encounter for screening for diabetes mellitus: Secondary | ICD-10-CM | POA: Diagnosis not present

## 2014-05-07 DIAGNOSIS — M9903 Segmental and somatic dysfunction of lumbar region: Secondary | ICD-10-CM | POA: Diagnosis not present

## 2014-05-07 DIAGNOSIS — K861 Other chronic pancreatitis: Secondary | ICD-10-CM | POA: Diagnosis not present

## 2014-05-07 DIAGNOSIS — M859 Disorder of bone density and structure, unspecified: Secondary | ICD-10-CM | POA: Diagnosis not present

## 2014-05-21 DIAGNOSIS — M5136 Other intervertebral disc degeneration, lumbar region: Secondary | ICD-10-CM | POA: Diagnosis not present

## 2014-05-21 DIAGNOSIS — M9903 Segmental and somatic dysfunction of lumbar region: Secondary | ICD-10-CM | POA: Diagnosis not present

## 2014-05-28 DIAGNOSIS — M7521 Bicipital tendinitis, right shoulder: Secondary | ICD-10-CM | POA: Diagnosis not present

## 2014-06-06 DIAGNOSIS — M5136 Other intervertebral disc degeneration, lumbar region: Secondary | ICD-10-CM | POA: Diagnosis not present

## 2014-06-06 DIAGNOSIS — I4891 Unspecified atrial fibrillation: Secondary | ICD-10-CM | POA: Diagnosis not present

## 2014-06-06 DIAGNOSIS — M9903 Segmental and somatic dysfunction of lumbar region: Secondary | ICD-10-CM | POA: Diagnosis not present

## 2014-07-04 DIAGNOSIS — M19012 Primary osteoarthritis, left shoulder: Secondary | ICD-10-CM | POA: Diagnosis not present

## 2014-08-08 DIAGNOSIS — H40002 Preglaucoma, unspecified, left eye: Secondary | ICD-10-CM | POA: Diagnosis not present

## 2014-08-08 DIAGNOSIS — H4011X2 Primary open-angle glaucoma, moderate stage: Secondary | ICD-10-CM | POA: Diagnosis not present

## 2014-08-24 DIAGNOSIS — M4310 Spondylolisthesis, site unspecified: Secondary | ICD-10-CM | POA: Diagnosis not present

## 2014-08-24 DIAGNOSIS — M25551 Pain in right hip: Secondary | ICD-10-CM | POA: Diagnosis not present

## 2014-08-24 DIAGNOSIS — M545 Low back pain: Secondary | ICD-10-CM | POA: Diagnosis not present

## 2014-08-24 DIAGNOSIS — M5431 Sciatica, right side: Secondary | ICD-10-CM | POA: Diagnosis not present

## 2014-08-24 DIAGNOSIS — M169 Osteoarthritis of hip, unspecified: Secondary | ICD-10-CM | POA: Diagnosis not present

## 2014-08-31 DIAGNOSIS — M47817 Spondylosis without myelopathy or radiculopathy, lumbosacral region: Secondary | ICD-10-CM | POA: Diagnosis not present

## 2014-08-31 DIAGNOSIS — M4317 Spondylolisthesis, lumbosacral region: Secondary | ICD-10-CM | POA: Diagnosis not present

## 2014-08-31 DIAGNOSIS — M545 Low back pain: Secondary | ICD-10-CM | POA: Diagnosis not present

## 2014-09-07 DIAGNOSIS — M25551 Pain in right hip: Secondary | ICD-10-CM | POA: Diagnosis not present

## 2014-09-07 DIAGNOSIS — M4310 Spondylolisthesis, site unspecified: Secondary | ICD-10-CM | POA: Diagnosis not present

## 2014-09-07 DIAGNOSIS — M545 Low back pain: Secondary | ICD-10-CM | POA: Diagnosis not present

## 2014-09-11 DIAGNOSIS — M5416 Radiculopathy, lumbar region: Secondary | ICD-10-CM | POA: Diagnosis not present

## 2014-09-12 DIAGNOSIS — M5416 Radiculopathy, lumbar region: Secondary | ICD-10-CM | POA: Diagnosis not present

## 2014-09-19 DIAGNOSIS — M5416 Radiculopathy, lumbar region: Secondary | ICD-10-CM | POA: Diagnosis not present

## 2014-09-21 DIAGNOSIS — M5416 Radiculopathy, lumbar region: Secondary | ICD-10-CM | POA: Diagnosis not present

## 2014-09-26 DIAGNOSIS — M5416 Radiculopathy, lumbar region: Secondary | ICD-10-CM | POA: Diagnosis not present

## 2014-09-28 DIAGNOSIS — M5416 Radiculopathy, lumbar region: Secondary | ICD-10-CM | POA: Diagnosis not present

## 2014-10-03 DIAGNOSIS — M5416 Radiculopathy, lumbar region: Secondary | ICD-10-CM | POA: Diagnosis not present

## 2014-10-05 DIAGNOSIS — M5416 Radiculopathy, lumbar region: Secondary | ICD-10-CM | POA: Diagnosis not present

## 2014-10-10 DIAGNOSIS — M5416 Radiculopathy, lumbar region: Secondary | ICD-10-CM | POA: Diagnosis not present

## 2014-10-12 DIAGNOSIS — M5416 Radiculopathy, lumbar region: Secondary | ICD-10-CM | POA: Diagnosis not present

## 2014-10-15 DIAGNOSIS — M5416 Radiculopathy, lumbar region: Secondary | ICD-10-CM | POA: Diagnosis not present

## 2014-10-17 DIAGNOSIS — M5416 Radiculopathy, lumbar region: Secondary | ICD-10-CM | POA: Diagnosis not present

## 2014-10-19 DIAGNOSIS — M545 Low back pain: Secondary | ICD-10-CM | POA: Diagnosis not present

## 2014-10-19 DIAGNOSIS — M25551 Pain in right hip: Secondary | ICD-10-CM | POA: Diagnosis not present

## 2014-10-19 DIAGNOSIS — M169 Osteoarthritis of hip, unspecified: Secondary | ICD-10-CM | POA: Diagnosis not present

## 2014-10-31 DIAGNOSIS — M5416 Radiculopathy, lumbar region: Secondary | ICD-10-CM | POA: Diagnosis not present

## 2014-11-02 DIAGNOSIS — M5416 Radiculopathy, lumbar region: Secondary | ICD-10-CM | POA: Diagnosis not present

## 2014-11-06 DIAGNOSIS — M25551 Pain in right hip: Secondary | ICD-10-CM | POA: Diagnosis not present

## 2014-11-06 DIAGNOSIS — M169 Osteoarthritis of hip, unspecified: Secondary | ICD-10-CM | POA: Diagnosis not present

## 2014-11-06 DIAGNOSIS — M5416 Radiculopathy, lumbar region: Secondary | ICD-10-CM | POA: Diagnosis not present

## 2014-11-09 DIAGNOSIS — M5416 Radiculopathy, lumbar region: Secondary | ICD-10-CM | POA: Diagnosis not present

## 2014-11-14 DIAGNOSIS — M5416 Radiculopathy, lumbar region: Secondary | ICD-10-CM | POA: Diagnosis not present

## 2014-11-15 DIAGNOSIS — M169 Osteoarthritis of hip, unspecified: Secondary | ICD-10-CM | POA: Diagnosis not present

## 2014-11-15 DIAGNOSIS — M25551 Pain in right hip: Secondary | ICD-10-CM | POA: Diagnosis not present

## 2014-11-16 DIAGNOSIS — Z01818 Encounter for other preprocedural examination: Secondary | ICD-10-CM | POA: Diagnosis not present

## 2014-11-16 DIAGNOSIS — I4891 Unspecified atrial fibrillation: Secondary | ICD-10-CM | POA: Diagnosis not present

## 2014-11-16 DIAGNOSIS — M5416 Radiculopathy, lumbar region: Secondary | ICD-10-CM | POA: Diagnosis not present

## 2014-11-19 DIAGNOSIS — I4891 Unspecified atrial fibrillation: Secondary | ICD-10-CM | POA: Diagnosis not present

## 2014-11-19 DIAGNOSIS — Z0001 Encounter for general adult medical examination with abnormal findings: Secondary | ICD-10-CM | POA: Diagnosis not present

## 2014-11-19 DIAGNOSIS — E780 Pure hypercholesterolemia: Secondary | ICD-10-CM | POA: Diagnosis not present

## 2014-11-19 DIAGNOSIS — Z8719 Personal history of other diseases of the digestive system: Secondary | ICD-10-CM | POA: Diagnosis not present

## 2014-11-19 DIAGNOSIS — Z131 Encounter for screening for diabetes mellitus: Secondary | ICD-10-CM | POA: Diagnosis not present

## 2014-11-19 DIAGNOSIS — L309 Dermatitis, unspecified: Secondary | ICD-10-CM | POA: Diagnosis not present

## 2014-11-21 DIAGNOSIS — M5416 Radiculopathy, lumbar region: Secondary | ICD-10-CM | POA: Diagnosis not present

## 2014-11-23 DIAGNOSIS — M5416 Radiculopathy, lumbar region: Secondary | ICD-10-CM | POA: Diagnosis not present

## 2014-11-27 DIAGNOSIS — Z01810 Encounter for preprocedural cardiovascular examination: Secondary | ICD-10-CM | POA: Diagnosis not present

## 2014-11-27 DIAGNOSIS — I4891 Unspecified atrial fibrillation: Secondary | ICD-10-CM | POA: Diagnosis not present

## 2014-11-27 DIAGNOSIS — Z01818 Encounter for other preprocedural examination: Secondary | ICD-10-CM | POA: Diagnosis not present

## 2014-11-28 DIAGNOSIS — M5416 Radiculopathy, lumbar region: Secondary | ICD-10-CM | POA: Diagnosis not present

## 2014-11-28 HISTORY — PX: TOTAL HIP ARTHROPLASTY: SHX124

## 2014-11-30 DIAGNOSIS — M5416 Radiculopathy, lumbar region: Secondary | ICD-10-CM | POA: Diagnosis not present

## 2014-12-20 DIAGNOSIS — M1611 Unilateral primary osteoarthritis, right hip: Secondary | ICD-10-CM | POA: Diagnosis not present

## 2014-12-20 DIAGNOSIS — Z87891 Personal history of nicotine dependence: Secondary | ICD-10-CM | POA: Diagnosis not present

## 2014-12-20 DIAGNOSIS — G4730 Sleep apnea, unspecified: Secondary | ICD-10-CM | POA: Diagnosis not present

## 2014-12-20 DIAGNOSIS — E669 Obesity, unspecified: Secondary | ICD-10-CM | POA: Diagnosis not present

## 2014-12-20 DIAGNOSIS — Z79899 Other long term (current) drug therapy: Secondary | ICD-10-CM | POA: Diagnosis not present

## 2014-12-20 DIAGNOSIS — K219 Gastro-esophageal reflux disease without esophagitis: Secondary | ICD-10-CM | POA: Diagnosis not present

## 2014-12-20 DIAGNOSIS — Z01818 Encounter for other preprocedural examination: Secondary | ICD-10-CM | POA: Diagnosis not present

## 2014-12-20 DIAGNOSIS — Z6837 Body mass index (BMI) 37.0-37.9, adult: Secondary | ICD-10-CM | POA: Diagnosis not present

## 2014-12-20 DIAGNOSIS — Z96611 Presence of right artificial shoulder joint: Secondary | ICD-10-CM | POA: Diagnosis not present

## 2014-12-20 DIAGNOSIS — E785 Hyperlipidemia, unspecified: Secondary | ICD-10-CM | POA: Diagnosis not present

## 2014-12-20 DIAGNOSIS — I480 Paroxysmal atrial fibrillation: Secondary | ICD-10-CM | POA: Diagnosis not present

## 2014-12-20 DIAGNOSIS — Z7982 Long term (current) use of aspirin: Secondary | ICD-10-CM | POA: Diagnosis not present

## 2014-12-20 DIAGNOSIS — Z791 Long term (current) use of non-steroidal anti-inflammatories (NSAID): Secondary | ICD-10-CM | POA: Diagnosis not present

## 2014-12-20 DIAGNOSIS — G43909 Migraine, unspecified, not intractable, without status migrainosus: Secondary | ICD-10-CM | POA: Diagnosis not present

## 2014-12-25 DIAGNOSIS — G4730 Sleep apnea, unspecified: Secondary | ICD-10-CM | POA: Diagnosis present

## 2014-12-25 DIAGNOSIS — D649 Anemia, unspecified: Secondary | ICD-10-CM | POA: Diagnosis present

## 2014-12-25 DIAGNOSIS — Z96641 Presence of right artificial hip joint: Secondary | ICD-10-CM | POA: Diagnosis not present

## 2014-12-25 DIAGNOSIS — Z888 Allergy status to other drugs, medicaments and biological substances status: Secondary | ICD-10-CM | POA: Diagnosis not present

## 2014-12-25 DIAGNOSIS — E785 Hyperlipidemia, unspecified: Secondary | ICD-10-CM | POA: Diagnosis present

## 2014-12-25 DIAGNOSIS — R609 Edema, unspecified: Secondary | ICD-10-CM | POA: Diagnosis present

## 2014-12-25 DIAGNOSIS — Z6837 Body mass index (BMI) 37.0-37.9, adult: Secondary | ICD-10-CM | POA: Diagnosis not present

## 2014-12-25 DIAGNOSIS — Z7982 Long term (current) use of aspirin: Secondary | ICD-10-CM | POA: Diagnosis not present

## 2014-12-25 DIAGNOSIS — Z471 Aftercare following joint replacement surgery: Secondary | ICD-10-CM | POA: Diagnosis not present

## 2014-12-25 DIAGNOSIS — K219 Gastro-esophageal reflux disease without esophagitis: Secondary | ICD-10-CM | POA: Diagnosis present

## 2014-12-25 DIAGNOSIS — M1611 Unilateral primary osteoarthritis, right hip: Secondary | ICD-10-CM | POA: Diagnosis not present

## 2014-12-25 DIAGNOSIS — E669 Obesity, unspecified: Secondary | ICD-10-CM | POA: Diagnosis present

## 2014-12-25 DIAGNOSIS — Z91048 Other nonmedicinal substance allergy status: Secondary | ICD-10-CM | POA: Diagnosis not present

## 2014-12-25 DIAGNOSIS — Z87891 Personal history of nicotine dependence: Secondary | ICD-10-CM | POA: Diagnosis not present

## 2014-12-25 DIAGNOSIS — G43909 Migraine, unspecified, not intractable, without status migrainosus: Secondary | ICD-10-CM | POA: Diagnosis present

## 2014-12-25 DIAGNOSIS — Z79899 Other long term (current) drug therapy: Secondary | ICD-10-CM | POA: Diagnosis not present

## 2014-12-25 DIAGNOSIS — I480 Paroxysmal atrial fibrillation: Secondary | ICD-10-CM | POA: Diagnosis present

## 2014-12-25 DIAGNOSIS — Z791 Long term (current) use of non-steroidal anti-inflammatories (NSAID): Secondary | ICD-10-CM | POA: Diagnosis not present

## 2014-12-29 DIAGNOSIS — Z471 Aftercare following joint replacement surgery: Secondary | ICD-10-CM | POA: Diagnosis not present

## 2014-12-29 DIAGNOSIS — Z9181 History of falling: Secondary | ICD-10-CM | POA: Diagnosis not present

## 2014-12-29 DIAGNOSIS — Z96641 Presence of right artificial hip joint: Secondary | ICD-10-CM | POA: Diagnosis not present

## 2014-12-29 DIAGNOSIS — Z4802 Encounter for removal of sutures: Secondary | ICD-10-CM | POA: Diagnosis not present

## 2014-12-29 DIAGNOSIS — Z4801 Encounter for change or removal of surgical wound dressing: Secondary | ICD-10-CM | POA: Diagnosis not present

## 2014-12-29 DIAGNOSIS — I480 Paroxysmal atrial fibrillation: Secondary | ICD-10-CM | POA: Diagnosis not present

## 2015-01-01 DIAGNOSIS — I480 Paroxysmal atrial fibrillation: Secondary | ICD-10-CM | POA: Diagnosis not present

## 2015-01-01 DIAGNOSIS — Z9181 History of falling: Secondary | ICD-10-CM | POA: Diagnosis not present

## 2015-01-01 DIAGNOSIS — Z4802 Encounter for removal of sutures: Secondary | ICD-10-CM | POA: Diagnosis not present

## 2015-01-01 DIAGNOSIS — Z4801 Encounter for change or removal of surgical wound dressing: Secondary | ICD-10-CM | POA: Diagnosis not present

## 2015-01-01 DIAGNOSIS — Z471 Aftercare following joint replacement surgery: Secondary | ICD-10-CM | POA: Diagnosis not present

## 2015-01-01 DIAGNOSIS — Z96641 Presence of right artificial hip joint: Secondary | ICD-10-CM | POA: Diagnosis not present

## 2015-01-03 DIAGNOSIS — Z4801 Encounter for change or removal of surgical wound dressing: Secondary | ICD-10-CM | POA: Diagnosis not present

## 2015-01-03 DIAGNOSIS — Z471 Aftercare following joint replacement surgery: Secondary | ICD-10-CM | POA: Diagnosis not present

## 2015-01-03 DIAGNOSIS — Z96641 Presence of right artificial hip joint: Secondary | ICD-10-CM | POA: Diagnosis not present

## 2015-01-03 DIAGNOSIS — I480 Paroxysmal atrial fibrillation: Secondary | ICD-10-CM | POA: Diagnosis not present

## 2015-01-03 DIAGNOSIS — R609 Edema, unspecified: Secondary | ICD-10-CM | POA: Diagnosis not present

## 2015-01-03 DIAGNOSIS — Z4802 Encounter for removal of sutures: Secondary | ICD-10-CM | POA: Diagnosis not present

## 2015-01-03 DIAGNOSIS — Z9181 History of falling: Secondary | ICD-10-CM | POA: Diagnosis not present

## 2015-01-04 DIAGNOSIS — I480 Paroxysmal atrial fibrillation: Secondary | ICD-10-CM | POA: Diagnosis not present

## 2015-01-04 DIAGNOSIS — Z9181 History of falling: Secondary | ICD-10-CM | POA: Diagnosis not present

## 2015-01-04 DIAGNOSIS — Z96641 Presence of right artificial hip joint: Secondary | ICD-10-CM | POA: Diagnosis not present

## 2015-01-04 DIAGNOSIS — Z4802 Encounter for removal of sutures: Secondary | ICD-10-CM | POA: Diagnosis not present

## 2015-01-04 DIAGNOSIS — Z4801 Encounter for change or removal of surgical wound dressing: Secondary | ICD-10-CM | POA: Diagnosis not present

## 2015-01-04 DIAGNOSIS — Z471 Aftercare following joint replacement surgery: Secondary | ICD-10-CM | POA: Diagnosis not present

## 2015-01-05 DIAGNOSIS — Z7982 Long term (current) use of aspirin: Secondary | ICD-10-CM | POA: Diagnosis not present

## 2015-01-05 DIAGNOSIS — Z79891 Long term (current) use of opiate analgesic: Secondary | ICD-10-CM | POA: Diagnosis not present

## 2015-01-05 DIAGNOSIS — Z87891 Personal history of nicotine dependence: Secondary | ICD-10-CM | POA: Diagnosis not present

## 2015-01-05 DIAGNOSIS — Z888 Allergy status to other drugs, medicaments and biological substances status: Secondary | ICD-10-CM | POA: Diagnosis not present

## 2015-01-05 DIAGNOSIS — Z79899 Other long term (current) drug therapy: Secondary | ICD-10-CM | POA: Diagnosis not present

## 2015-01-05 DIAGNOSIS — L03115 Cellulitis of right lower limb: Secondary | ICD-10-CM | POA: Diagnosis not present

## 2015-01-05 DIAGNOSIS — R609 Edema, unspecified: Secondary | ICD-10-CM | POA: Diagnosis not present

## 2015-01-05 DIAGNOSIS — M7989 Other specified soft tissue disorders: Secondary | ICD-10-CM | POA: Diagnosis not present

## 2015-01-05 DIAGNOSIS — R600 Localized edema: Secondary | ICD-10-CM | POA: Diagnosis not present

## 2015-01-05 DIAGNOSIS — I4891 Unspecified atrial fibrillation: Secondary | ICD-10-CM | POA: Diagnosis not present

## 2015-01-07 DIAGNOSIS — Z4802 Encounter for removal of sutures: Secondary | ICD-10-CM | POA: Diagnosis not present

## 2015-01-07 DIAGNOSIS — Z471 Aftercare following joint replacement surgery: Secondary | ICD-10-CM | POA: Diagnosis not present

## 2015-01-07 DIAGNOSIS — Z4801 Encounter for change or removal of surgical wound dressing: Secondary | ICD-10-CM | POA: Diagnosis not present

## 2015-01-07 DIAGNOSIS — Z96641 Presence of right artificial hip joint: Secondary | ICD-10-CM | POA: Diagnosis not present

## 2015-01-07 DIAGNOSIS — Z9181 History of falling: Secondary | ICD-10-CM | POA: Diagnosis not present

## 2015-01-07 DIAGNOSIS — I480 Paroxysmal atrial fibrillation: Secondary | ICD-10-CM | POA: Diagnosis not present

## 2015-01-09 DIAGNOSIS — I480 Paroxysmal atrial fibrillation: Secondary | ICD-10-CM | POA: Diagnosis not present

## 2015-01-09 DIAGNOSIS — Z9181 History of falling: Secondary | ICD-10-CM | POA: Diagnosis not present

## 2015-01-09 DIAGNOSIS — Z96641 Presence of right artificial hip joint: Secondary | ICD-10-CM | POA: Diagnosis not present

## 2015-01-09 DIAGNOSIS — Z4802 Encounter for removal of sutures: Secondary | ICD-10-CM | POA: Diagnosis not present

## 2015-01-09 DIAGNOSIS — Z471 Aftercare following joint replacement surgery: Secondary | ICD-10-CM | POA: Diagnosis not present

## 2015-01-09 DIAGNOSIS — Z4801 Encounter for change or removal of surgical wound dressing: Secondary | ICD-10-CM | POA: Diagnosis not present

## 2015-01-11 DIAGNOSIS — Z4801 Encounter for change or removal of surgical wound dressing: Secondary | ICD-10-CM | POA: Diagnosis not present

## 2015-01-11 DIAGNOSIS — Z471 Aftercare following joint replacement surgery: Secondary | ICD-10-CM | POA: Diagnosis not present

## 2015-01-11 DIAGNOSIS — I480 Paroxysmal atrial fibrillation: Secondary | ICD-10-CM | POA: Diagnosis not present

## 2015-01-11 DIAGNOSIS — Z4802 Encounter for removal of sutures: Secondary | ICD-10-CM | POA: Diagnosis not present

## 2015-01-11 DIAGNOSIS — Z9181 History of falling: Secondary | ICD-10-CM | POA: Diagnosis not present

## 2015-01-11 DIAGNOSIS — Z96641 Presence of right artificial hip joint: Secondary | ICD-10-CM | POA: Diagnosis not present

## 2015-01-14 DIAGNOSIS — Z96641 Presence of right artificial hip joint: Secondary | ICD-10-CM | POA: Diagnosis not present

## 2015-01-14 DIAGNOSIS — Z4802 Encounter for removal of sutures: Secondary | ICD-10-CM | POA: Diagnosis not present

## 2015-01-14 DIAGNOSIS — Z471 Aftercare following joint replacement surgery: Secondary | ICD-10-CM | POA: Diagnosis not present

## 2015-01-14 DIAGNOSIS — I480 Paroxysmal atrial fibrillation: Secondary | ICD-10-CM | POA: Diagnosis not present

## 2015-01-14 DIAGNOSIS — Z4801 Encounter for change or removal of surgical wound dressing: Secondary | ICD-10-CM | POA: Diagnosis not present

## 2015-01-14 DIAGNOSIS — Z9181 History of falling: Secondary | ICD-10-CM | POA: Diagnosis not present

## 2015-01-16 DIAGNOSIS — Z9181 History of falling: Secondary | ICD-10-CM | POA: Diagnosis not present

## 2015-01-16 DIAGNOSIS — Z96641 Presence of right artificial hip joint: Secondary | ICD-10-CM | POA: Diagnosis not present

## 2015-01-16 DIAGNOSIS — I480 Paroxysmal atrial fibrillation: Secondary | ICD-10-CM | POA: Diagnosis not present

## 2015-01-16 DIAGNOSIS — Z4801 Encounter for change or removal of surgical wound dressing: Secondary | ICD-10-CM | POA: Diagnosis not present

## 2015-01-16 DIAGNOSIS — Z471 Aftercare following joint replacement surgery: Secondary | ICD-10-CM | POA: Diagnosis not present

## 2015-01-16 DIAGNOSIS — Z4802 Encounter for removal of sutures: Secondary | ICD-10-CM | POA: Diagnosis not present

## 2015-01-17 DIAGNOSIS — I480 Paroxysmal atrial fibrillation: Secondary | ICD-10-CM | POA: Diagnosis not present

## 2015-01-17 DIAGNOSIS — Z96641 Presence of right artificial hip joint: Secondary | ICD-10-CM | POA: Diagnosis not present

## 2015-01-17 DIAGNOSIS — Z4801 Encounter for change or removal of surgical wound dressing: Secondary | ICD-10-CM | POA: Diagnosis not present

## 2015-01-17 DIAGNOSIS — Z4802 Encounter for removal of sutures: Secondary | ICD-10-CM | POA: Diagnosis not present

## 2015-01-17 DIAGNOSIS — Z471 Aftercare following joint replacement surgery: Secondary | ICD-10-CM | POA: Diagnosis not present

## 2015-01-17 DIAGNOSIS — Z9181 History of falling: Secondary | ICD-10-CM | POA: Diagnosis not present

## 2015-01-18 DIAGNOSIS — M25551 Pain in right hip: Secondary | ICD-10-CM | POA: Diagnosis not present

## 2015-02-06 DIAGNOSIS — H4011X2 Primary open-angle glaucoma, moderate stage: Secondary | ICD-10-CM | POA: Diagnosis not present

## 2015-04-03 DIAGNOSIS — M25551 Pain in right hip: Secondary | ICD-10-CM | POA: Diagnosis not present

## 2015-04-12 DIAGNOSIS — I4891 Unspecified atrial fibrillation: Secondary | ICD-10-CM | POA: Diagnosis not present

## 2015-04-26 DIAGNOSIS — I480 Paroxysmal atrial fibrillation: Secondary | ICD-10-CM | POA: Diagnosis not present

## 2015-06-13 DIAGNOSIS — Z23 Encounter for immunization: Secondary | ICD-10-CM | POA: Diagnosis not present

## 2015-07-19 DIAGNOSIS — H6123 Impacted cerumen, bilateral: Secondary | ICD-10-CM | POA: Diagnosis not present

## 2015-08-14 DIAGNOSIS — H401111 Primary open-angle glaucoma, right eye, mild stage: Secondary | ICD-10-CM | POA: Diagnosis not present

## 2015-08-14 DIAGNOSIS — H40022 Open angle with borderline findings, high risk, left eye: Secondary | ICD-10-CM | POA: Diagnosis not present

## 2015-10-18 DIAGNOSIS — L989 Disorder of the skin and subcutaneous tissue, unspecified: Secondary | ICD-10-CM | POA: Diagnosis not present

## 2015-10-18 DIAGNOSIS — H1010 Acute atopic conjunctivitis, unspecified eye: Secondary | ICD-10-CM | POA: Diagnosis not present

## 2015-10-18 DIAGNOSIS — L719 Rosacea, unspecified: Secondary | ICD-10-CM | POA: Diagnosis not present

## 2015-10-18 DIAGNOSIS — J309 Allergic rhinitis, unspecified: Secondary | ICD-10-CM | POA: Diagnosis not present

## 2015-11-21 DIAGNOSIS — L570 Actinic keratosis: Secondary | ICD-10-CM | POA: Diagnosis not present

## 2015-11-21 DIAGNOSIS — D485 Neoplasm of uncertain behavior of skin: Secondary | ICD-10-CM | POA: Diagnosis not present

## 2015-11-21 DIAGNOSIS — L218 Other seborrheic dermatitis: Secondary | ICD-10-CM | POA: Diagnosis not present

## 2015-11-22 DIAGNOSIS — M160 Bilateral primary osteoarthritis of hip: Secondary | ICD-10-CM | POA: Diagnosis not present

## 2015-11-22 DIAGNOSIS — Z471 Aftercare following joint replacement surgery: Secondary | ICD-10-CM | POA: Diagnosis not present

## 2015-11-22 DIAGNOSIS — M5442 Lumbago with sciatica, left side: Secondary | ICD-10-CM | POA: Diagnosis not present

## 2015-11-22 DIAGNOSIS — M5441 Lumbago with sciatica, right side: Secondary | ICD-10-CM | POA: Diagnosis not present

## 2015-11-22 DIAGNOSIS — M7061 Trochanteric bursitis, right hip: Secondary | ICD-10-CM | POA: Diagnosis not present

## 2015-11-22 DIAGNOSIS — M25551 Pain in right hip: Secondary | ICD-10-CM | POA: Diagnosis not present

## 2015-11-22 DIAGNOSIS — G8929 Other chronic pain: Secondary | ICD-10-CM | POA: Diagnosis not present

## 2015-11-22 DIAGNOSIS — M25552 Pain in left hip: Secondary | ICD-10-CM | POA: Diagnosis not present

## 2015-11-22 DIAGNOSIS — M7062 Trochanteric bursitis, left hip: Secondary | ICD-10-CM | POA: Diagnosis not present

## 2015-11-22 DIAGNOSIS — M8580 Other specified disorders of bone density and structure, unspecified site: Secondary | ICD-10-CM | POA: Diagnosis not present

## 2015-11-22 DIAGNOSIS — Z96641 Presence of right artificial hip joint: Secondary | ICD-10-CM | POA: Diagnosis not present

## 2015-11-29 DIAGNOSIS — E785 Hyperlipidemia, unspecified: Secondary | ICD-10-CM | POA: Diagnosis not present

## 2015-12-23 ENCOUNTER — Encounter: Payer: Self-pay | Admitting: Physical Therapy

## 2015-12-23 ENCOUNTER — Ambulatory Visit: Payer: Medicare Other | Attending: Orthopedic Surgery | Admitting: Physical Therapy

## 2015-12-23 DIAGNOSIS — M6281 Muscle weakness (generalized): Secondary | ICD-10-CM | POA: Insufficient documentation

## 2015-12-23 DIAGNOSIS — M25551 Pain in right hip: Secondary | ICD-10-CM | POA: Insufficient documentation

## 2015-12-23 DIAGNOSIS — M25651 Stiffness of right hip, not elsewhere classified: Secondary | ICD-10-CM | POA: Insufficient documentation

## 2015-12-23 DIAGNOSIS — M25552 Pain in left hip: Secondary | ICD-10-CM

## 2015-12-23 DIAGNOSIS — M25652 Stiffness of left hip, not elsewhere classified: Secondary | ICD-10-CM

## 2015-12-24 NOTE — Therapy (Deleted)
[place], [place], [address] Phone: [phone]   Fax:  [phone]  Physical Therapy Evaluation  Patient Details  Name: [name] MRN: [id number] Date of Birth: Jul 19, 1940 Referring Provider: Benjaman Lobe PA-C   Encounter Date: 12/23/2015      PT End of Session - 12/24/15 0746    Visit Number 1   Number of Visits 16   Date for PT Re-Evaluation 02/18/16   Authorization Type medicare   Authorization Time Period Kx modifier in 15 visits.    PT Start Time 1545   PT Stop Time 1629   PT Time Calculation (min) 44 min   Activity Tolerance Patient tolerated treatment well   Behavior During Therapy WFL for tasks assessed/performed      Past Medical History  Diagnosis Date  . Atrial fibrillation ([place])   . Arthritis     right shoulder replacement, right replacement     Past Surgical History  Procedure Laterality Date  . Joint replacement      right shoulder 2008; right hip 2016    There were no vitals filed for this visit.       Subjective Assessment - 12/23/15 1545    Subjective Patient reports pain in her left hip that began a few months ago. Her pain had an incidious onset. She has had a left sided THA. She feels most fo her pain when she sits or stands for a long period of time. She was not using a cane until she began having pain.,   Pertinent History right hip replacement 2016; right shoulder replacement 2008;    Limitations Walking;Standing   How long can you sit comfortably? sitting makes it stiff but no set time.   How long can you stand comfortably? att iems standing is limited at times she cand stand for longer    How long can you walk comfortably? distances change    Diagnostic tests x-ray: per patient no significant arthritis    Currently in Pain? Yes   Pain Score 8   most of the time it runs at about a 3-4/10    Pain Location Hip   Pain Orientation Left   Pain  Descriptors / Indicators Aching;Sharp   Pain Type Acute pain   Pain Onset More than a month ago   Pain Frequency Intermittent   Aggravating Factors  standing, walking, prolonged sitting    Pain Relieving Factors ice, tramadol    Effect of Pain on Daily Activities difficulty walking             [facility] PT Assessment - 12/24/15 0001    Assessment   Medical Diagnosis Right torachnteric bursitis/ right sided sciatica   Referring Provider Bo Merino Stridh PA-C    Onset Date/Surgical Date --  3-4 months prior    Hand Dominance Right   Next MD Visit Depends on progress.    Prior Therapy For the hip replacement    Precautions   Precautions None   Restrictions   Weight Bearing Restrictions No   Balance Screen   Has the patient fallen in the past 6 months No   Home Environment   Additional Comments Lives at home with her husband. No steps at her house    Prior Function   Level of Independence Independent with household mobility with device   Cognition   Overall Cognitive Status Within Functional Limits for tasks assessed   Observation/Other Assessments   Observations Sits with flexed  trunk;    Sensation   Additional Comments Pain down into her right leg at times. No real pattern to the pain per patient   Posture/Postural Control   Posture/Postural Control Postural limitations   Posture Comments Forward head; rounded shoulders    ROM / Strength   AROM / PROM / Strength AROM;PROM;Strength   AROM   AROM Assessment Site Lumbar   Lumbar Flexion 50% limited    Lumbar Extension 25% limited    Lumbar - Right Side Bend 25 %    Lumbar - Left Side Bend 25%    Lumbar - Right Rotation 25 %    Lumbar - Left Rotation 25 %    Strength   Strength Assessment Site Hip;Knee;Ankle   Right/Left Hip Right;Left   Right Hip Flexion 4/5   Right Hip Extension 4/5   Right Hip ABduction 4/5   Right Hip ADduction 5/5   Left Hip Flexion 4+/5   Left Hip Extension 4/5   Left Hip ABduction 4/5    Left Hip ADduction 4/5   Right/Left Knee Right;Left   Right Knee Flexion 4+/5   Right Knee Extension 4/5   Left Knee Flexion 5/5   Left Knee Extension 5/5   Right/Left Ankle Right;Left   Flexibility   Soft Tissue Assessment /Muscle Length --  tight right hip internal and external rotators    Palpation   Palpation comment Tenderness to palpation around the greater trochanter; Tendenress to palpation in the lumbar spine; Tender to palpation in the right paraspinals and right quadratus;    Special Tests    Special Tests Hip Special Tests  SLR (-) L   Hip Special Tests  Ober's Test;Patrick (FABER) Test;Hip Scouring   Saralyn Pilar (FABER) Test   Findings Positive   Side Right   Ober's Test   Findings Positive   Side Right   Hip Scouring   Findings Positive   Side Right   Ambulation/Gait   Gait Comments Decreased right hip flexion; Right sided trendelenburgh   High Level Balance   High Level Balance Comments Uanble to balance on the right leg.                   [place] Adult PT Treatment/Exercise - 12/24/15 0001    Self-Care   Self-Care Other Self-Care Comments   Other Self-Care Comments  Educated the patient on home activity modification and activity progression with HEP.    Lumbar Exercises: Stretches   Single Knee to Chest Stretch Limitations 2x20sec    Lower Trunk Rotation Limitations x10    Piriformis Stretch Limitations 2x20sec    Lumbar Exercises: Standing   Other Standing Lumbar Exercises hip external trotation stretch 2x30sec hold;    Lumbar Exercises: Supine   Bent Knee Raise Limitations 2x10   Bridge Limitations x10   Straight Leg Raises Limitations 2x5                PT Education - 12/24/15 0745    Education provided Yes   Education Details Improtance of stretching and strengthening    Person(s) Educated Patient   Methods Explanation;Demonstration   Comprehension Verbalized understanding;Returned demonstration          PT Short Term Goals -  12/24/15 0757    PT SHORT TERM GOAL #1   Title Patient will increase gross right hip and knee strength to 4+/5    PT SHORT TERM GOAL #2   Title Patient will be I w./ stretching and strengthening program at home  Time 4   Period Weeks   Status New   PT SHORT TERM GOAL #3   Title Patient will ambulate 400' with no device and no increase in pain    Time 4   Period Weeks   Status New   PT SHORT TERM GOAL #4   Title Patient will report 2/10 pain at worst in her hip    Time 4   Period Weeks   Status New           PT Long Term Goals - 01-20-2016 0759    PT LONG TERM GOAL #1   Title --   Time 8   Period Weeks   Status New   PT LONG TERM GOAL #2   Title Patient will sit for 1 hour without pain upon standing in order to take car rides    Time 8   Period Weeks   Status New   PT LONG TERM GOAL #3   Title Patient will stand for 1/2 hour without increased pain in order to perfrom her ADL's    Time 8   Period Weeks   PT LONG TERM GOAL #4   Title Patient will show a 35% limitation on her FOTO score    Time 8   Period Weeks   Status New   PT LONG TERM GOAL #5   Time --   Period --   Status --               Plan - 2016-01-20 0747    Clinical Impression Statement Patient is a 75 year old femal w/. right hip pain and at times right lower back pain that runs down her right leg. Her pain increases when she sits for too long and when she stands. She has significant muscle tightness in her right glutes and weakness in her right hip.. Her pain is making her ADL's difficultt. She would benefiit from further skilled therapy. She was seen for a low complexity evaluation.    Rehab Potential Excellent   Clinical Impairments Affecting Rehab Potential history of arthritis    PT Frequency 2x / week   PT Duration 8 weeks   PT Treatment/Interventions ADLs/Self Care Home Management;Cryotherapy;Electrical Stimulation;Moist Heat;Iontophoresis 4mg /ml Dexamethasone;Therapeutic  exercise;Therapeutic activities;Functional mobility training;Stair training;Gait training;Patient/family education;Manual techniques;Energy conservation;Splinting;Passive range of motion;Neuromuscular re-education;Ultrasound   PT Next Visit Plan review stretching, continue with core strengthening; consider hamstring stretching; Consider IT band roll out and soft tissue mobilization to the quadratus if spasming present; consider side lying or seated clamshell and standing hip flexion, for hip strengthening   PT Home Exercise Plan bridging, LTR, supine hip flexion, SLR 2x5 on the right x10 on the left; piriformis str   Consulted and Agree with Plan of Care Patient      Patient will benefit from skilled therapeutic intervention in order to improve the following deficits and impairments:  Decreased strength, Decreased mobility, Postural dysfunction, Improper body mechanics, Impaired flexibility, Hypomobility, Decreased activity tolerance, Decreased endurance, Difficulty walking, Decreased range of motion  Visit Diagnosis: Pain in right hip  Stiffness of right hip, not elsewhere classified  Muscle weakness (generalized)      G-Codes - 20-Jan-2016 0847    Functional Limitation Changing and maintaining body position   Changing and Maintaining Body Position Current Status [id number]) At least 40 percent but less than 60 percent impaired, limited or restricted   Changing and Maintaining Body Position Goal Status [id number]) At least 1 percent but less than 20 percent impaired, limited  or restricted       Problem List There are no active problems to display for this patient.   Carney Living PT DPT  12/24/2015, 8:57 AM  [facility] [address] [place], [place], [address] Phone: [phone]   Fax:  [phone]  Name: [name] MRN: [id number] Date of Birth: 05/28/1941

## 2015-12-27 ENCOUNTER — Ambulatory Visit: Payer: Medicare Other | Attending: Orthopedic Surgery | Admitting: Physical Therapy

## 2015-12-27 ENCOUNTER — Encounter: Payer: Self-pay | Admitting: Physical Therapy

## 2015-12-27 DIAGNOSIS — M25651 Stiffness of right hip, not elsewhere classified: Secondary | ICD-10-CM | POA: Insufficient documentation

## 2015-12-27 DIAGNOSIS — M25551 Pain in right hip: Secondary | ICD-10-CM | POA: Insufficient documentation

## 2015-12-27 DIAGNOSIS — M6281 Muscle weakness (generalized): Secondary | ICD-10-CM

## 2015-12-27 NOTE — Therapy (Signed)
[facility] [address], [place], [place], [address] Phone: [phone]   Fax:  [phone]  Physical Therapy Treatment  Patient Details  Name: [name] MRN: [id number] Date of Birth: Apr 25, 1941 Referring Provider: Benjaman Lobe PA-C   Encounter Date: 12/27/2015      PT End of Session - 12/27/15 1029    Visit Number 2   Number of Visits 16   Date for PT Re-Evaluation 02/18/16   Authorization Type medicare   Authorization Time Period Kx modifier in 15 visits.    PT Start Time 1025  pt late   PT Stop Time 1100   PT Time Calculation (min) 35 min   Activity Tolerance Patient tolerated treatment well   Behavior During Therapy WFL for tasks assessed/performed      Past Medical History  Diagnosis Date  . Atrial fibrillation ([place])   . Arthritis     right shoulder replacement, right replacement     Past Surgical History  Procedure Laterality Date  . Joint replacement      right shoulder 2008; right hip 2016    There were no vitals filed for this visit.      Subjective Assessment - 12/27/15 1030    Subjective Thinks she over stretched. Got pretty sore after "pushing it."   Currently in Pain? Yes   Pain Score 4   when she walked into the clinic today. None in sitting or laying.    Pain Location Hip   Pain Orientation Left   Pain Descriptors / Indicators Dull   Multiple Pain Sites No                         [facility] Adult PT Treatment/Exercise - 12/27/15 0001    Lumbar Exercises: Stretches   Single Knee to Chest Stretch 3 reps;20 seconds   Lower Trunk Rotation 3 reps;20 seconds   Piriformis Stretch 3 reps;30 seconds   Lumbar Exercises: Supine   Bridge 5 reps  Added ball squeeze and VC on technique                  PT Short Term Goals - 12/27/15 1043    PT SHORT TERM GOAL #2   Title Patient will be I w./ stretching and strengthening program at home   Time 4   Period Weeks   Status Achieved           PT Long Term Goals - 12/24/15 0759    PT LONG TERM GOAL #1   Title --   Time 8   Period Weeks   Status New   PT LONG TERM GOAL #2   Title Patient will sit for 1 hour without pain upon standing in order to take car rides    Time 8   Period Weeks   Status New   PT LONG TERM GOAL #3   Title Patient will stand for 1/2 hour without increased pain in order to perfrom her ADL's    Time 8   Period Weeks   PT LONG TERM GOAL #4   Title Patient will show a 35% limitation on her FOTO score    Time 8   Period Weeks   Status New   PT LONG TERM GOAL #5   Time --   Period --   Status --               Plan - 12/27/15 1029  Clinical Impression Statement Pt compliant with her HEP. Today we discussed ways to achieve the same results with her stretches but in way sthat were less painful to her.   Rehab Potential Excellent   Clinical Impairments Affecting Rehab Potential history of arthritis    PT Frequency 2x / week   PT Duration 8 weeks   PT Treatment/Interventions ADLs/Self Care Home Management;Cryotherapy;Electrical Stimulation;Moist Heat;Iontophoresis 4mg /ml Dexamethasone;Therapeutic exercise;Therapeutic activities;Functional mobility training;Stair training;Gait training;Patient/family education;Manual techniques;Energy conservation;Splinting;Passive range of motion;Neuromuscular re-education;Ultrasound   PT Next Visit Plan RT hip and knee strength, continue with hip flexibility and begin Nu Step.    Consulted and Agree with Plan of Care Patient      Patient will benefit from skilled therapeutic intervention in order to improve the following deficits and impairments:  Decreased strength, Decreased mobility, Postural dysfunction, Improper body mechanics, Impaired flexibility, Hypomobility, Decreased activity tolerance, Decreased endurance, Difficulty walking, Decreased range of motion  Visit Diagnosis: Pain in right hip  Stiffness  of right hip, not elsewhere classified  Muscle weakness (generalized)     Problem List There are no active problems to display for this patient.   COCHRAN,JENNIFER, PTA 12/27/2015, 11:03 AM  [facility] [address] [place], [place], [address] Phone: [phone]   Fax:  [phone]  Name: [name] MRN: [id number] Date of Birth: Jun 11, 1941

## 2016-01-02 ENCOUNTER — Encounter: Payer: Self-pay | Admitting: Physical Therapy

## 2016-01-02 ENCOUNTER — Ambulatory Visit: Payer: Medicare Other | Attending: Orthopedic Surgery | Admitting: Physical Therapy

## 2016-01-02 DIAGNOSIS — M25652 Stiffness of left hip, not elsewhere classified: Secondary | ICD-10-CM | POA: Diagnosis not present

## 2016-01-02 DIAGNOSIS — M25552 Pain in left hip: Secondary | ICD-10-CM | POA: Diagnosis not present

## 2016-01-02 DIAGNOSIS — M25551 Pain in right hip: Secondary | ICD-10-CM | POA: Insufficient documentation

## 2016-01-02 DIAGNOSIS — M25651 Stiffness of right hip, not elsewhere classified: Secondary | ICD-10-CM

## 2016-01-02 DIAGNOSIS — M6281 Muscle weakness (generalized): Secondary | ICD-10-CM | POA: Insufficient documentation

## 2016-01-02 NOTE — Therapy (Signed)
[facility] [address] [place], [place], [address] Phone: [phone]   Fax:  [phone]  Physical Therapy Treatment  Patient Details  Name: [name] MRN: [id number] Date of Birth: 10/02/1940 Referring Provider: Benjaman Lobe PA-C   Encounter Date: 01/02/2016      PT End of Session - 01/02/16 1055    Visit Number 3   Number of Visits 16   Authorization Type medicare   Authorization Time Period Kx modifier in 15 visits.    PT Start Time 1015   PT Stop Time 1055   PT Time Calculation (min) 40 min   Activity Tolerance Patient tolerated treatment well   Behavior During Therapy WFL for tasks assessed/performed      Past Medical History  Diagnosis Date  . Atrial fibrillation ([place])   . Arthritis     right shoulder replacement, right replacement     Past Surgical History  Procedure Laterality Date  . Joint replacement      right shoulder 2008; right hip 2016    There were no vitals filed for this visit.      Subjective Assessment - 01/02/16 1019    Subjective Thinks she over stretched. Soreness in Lt LE and some "cracking" in Lt hip but pain has gone away since she stopped doing the stretch   Pertinent History right hip replacement 2016; right shoulder replacement 2008;    Limitations Walking;Standing   Diagnostic tests x-ray: per patient no significant arthritis    Currently in Pain? Yes   Pain Score 2    Pain Location Back   Pain Orientation Left   Multiple Pain Sites No                         [facility] Adult PT Treatment/Exercise - 01/02/16 0001    Lumbar Exercises: Stretches   Single Knee to Chest Stretch 2 reps;30 seconds   Lower Trunk Rotation Limitations x 30   Lumbar Exercises: Supine   Heel Slides 10 reps  with ab set   Bent Knee Raise 15 reps   Bridge 20 reps  with add squeeze   Knee/Hip Exercises: Aerobic   Nustep level 1 x 5 minutes   Knee/Hip Exercises:  Standing   Other Standing Knee Exercises sit to stand without UE x 10, sit to stand with add squeeze without UE x 10   Other Standing Knee Exercises resisted walking 30# x 10 using SPC                  PT Short Term Goals - 12/27/15 1043    PT SHORT TERM GOAL #2   Title Patient will be I w./ stretching and strengthening program at home   Time 4   Period Weeks   Status Achieved           PT Long Term Goals - 01/02/16 1056    PT LONG TERM GOAL #2   Title Patient will sit for 1 hour without pain upon standing in order to take car rides    Status On-going   PT LONG TERM GOAL #3   Title Patient will stand for 1/2 hour without increased pain in order to perfrom her ADL's    Status On-going  20 minutes 7/6   PT LONG TERM GOAL #4   Title Patient will show a 35% limitation on her FOTO score    Status On-going  Plan - 01/02/16 1055    Clinical Impression Statement Pt tolerated treatment well today, able to add nu step and resisted walking without increase in symptoms   PT Frequency 2x / week   PT Duration 8 weeks   PT Treatment/Interventions ADLs/Self Care Home Management;Cryotherapy;Electrical Stimulation;Moist Heat;Iontophoresis 4mg /ml Dexamethasone;Therapeutic exercise;Therapeutic activities;Functional mobility training;Stair training;Gait training;Patient/family education;Manual techniques;Energy conservation;Splinting;Passive range of motion;Neuromuscular re-education;Ultrasound   PT Next Visit Plan assess and progress strengthening for core and LE   Consulted and Agree with Plan of Care Patient      Patient will benefit from skilled therapeutic intervention in order to improve the following deficits and impairments:  Decreased strength, Decreased mobility, Postural dysfunction, Improper body mechanics, Impaired flexibility, Hypomobility, Decreased activity tolerance, Decreased endurance, Difficulty walking, Decreased range of motion  Visit  Diagnosis: Pain in right hip  Stiffness of right hip, not elsewhere classified  Muscle weakness (generalized)     Problem List There are no active problems to display for this patient.   Isabelle Course, PT, DPT  01/02/2016, 10:57 AM  [facility] [address], [place], [place], [address] Phone: [phone]   Fax:  [phone]  Name: [name] MRN: [id number] Date of Birth: 10/26/1940

## 2016-01-06 NOTE — Therapy (Signed)
[place], [place], [address] Phone: [phone]   Fax:  [phone]  Physical Therapy Treatment  Patient Details  Name: [name] MRN: [id number] Date of Birth: 08-29-1940 Referring Provider: Benjaman Lobe PA-C   Encounter Date: 12/23/2015    Past Medical History  Diagnosis Date  . Atrial fibrillation ([place])   . Arthritis     right shoulder replacement, right replacement     Past Surgical History  Procedure Laterality Date  . Joint replacement      right shoulder 2008; right hip 2016    There were no vitals filed for this visit.                                 PT Short Term Goals - 12/27/15 1043    PT SHORT TERM GOAL #2   Title Patient will be I w./ stretching and strengthening program at home   Time 4   Period Weeks   Status Achieved           PT Long Term Goals - 01/02/16 1056    PT LONG TERM GOAL #2   Title Patient will sit for 1 hour without pain upon standing in order to take car rides    Status On-going   PT LONG TERM GOAL #3   Title Patient will stand for 1/2 hour without increased pain in order to perfrom her ADL's    Status On-going  20 minutes 7/6   PT LONG TERM GOAL #4   Title Patient will show a 35% limitation on her FOTO score    Status On-going             Patient will benefit from skilled therapeutic intervention in order to improve the following deficits and impairments:  Decreased strength, Decreased mobility, Postural dysfunction, Improper body mechanics, Impaired flexibility, Hypomobility, Decreased activity tolerance, Decreased endurance, Difficulty walking, Decreased range of motion  Visit Diagnosis: Pain in left hip - Plan: PT plan of care cert/re-cert, PT plan of care cert/re-cert  Stiffness of left hip, not elsewhere classified - Plan: PT plan of care cert/re-cert, PT plan of care cert/re-cert  Muscle weakness  (generalized) - Plan: PT plan of care cert/re-cert, PT plan of care cert/re-cert  Stiffness of right hip, not elsewhere classified - Plan: PT plan of care cert/re-cert  Pain in right hip - Plan: PT plan of care cert/re-cert     Problem List There are no active problems to display for this patient.   Carney Living 01/06/2016, 10:15 AM  [facility] [address] [place], [place], [address] Phone: [phone]   Fax:  [phone]  Name: RICKEL CETINA MRN: [id number] Date of Birth: September 25, 1940

## 2016-01-06 NOTE — Addendum Note (Signed)
Addended by: Carney Living on: 01/06/2016 10:14 AM   Modules accepted: Orders

## 2016-01-06 NOTE — Therapy (Signed)
[place], [place], [address] Phone: [phone]   Fax:  [phone]  Physical Therapy Evaluation  Patient Details  Name: SESLEY DOLLISON MRN: [id number] Date of Birth: 1940/12/03 Referring Provider: Benjaman Lobe PA-C   Encounter Date: 12/23/2015      PT End of Session - 01/06/16 1020    Visit Number 1   Number of Visits 16   Date for PT Re-Evaluation 02/18/16   Authorization Type medicare   Authorization Time Period Kx modifier in 15 visits.    Activity Tolerance Patient tolerated treatment well   Behavior During Therapy [facility] for tasks assessed/performed      Past Medical History  Diagnosis Date  . Atrial fibrillation ([place])   . Arthritis     right shoulder replacement, right replacement     Past Surgical History  Procedure Laterality Date  . Joint replacement      right shoulder 2008; right hip 2016    There were no vitals filed for this visit.       Subjective Assessment - 01/06/16 1020    Subjective Patient reports pain in her left hip that began a few months ago. Her pain had an incidious onset. She has had a right sided THA. She feels most fo her pain when she sits or stands for a long period of time. She was not using a cane until she began having pain.,   Pertinent History right hip replacement 2016; right shoulder replacement 2008;    Limitations Walking;Standing   How long can you sit comfortably? sitting makes it stiff but no set time.   How long can you stand comfortably? att iems standing is limited at times she cand stand for longer    How long can you walk comfortably? distances change    Diagnostic tests x-ray: per patient no significant arthritis    Pain Onset More than a month ago                                 PT Short Term Goals - 01/06/16 1021    PT SHORT TERM GOAL #1   Title Patient will increase gross left hip and knee strength to  4+/5    Time 4   Period Weeks   Status New   PT SHORT TERM GOAL #2   Title Patient will be I w./ stretching and strengthening program at home   Time 4   Period Weeks   Status New   PT SHORT TERM GOAL #3   Title Patient will ambulate 400' with no device and no increase in pain    Time 4   Period Weeks   Status New   PT SHORT TERM GOAL #4   Title Patient will report 2/10 pain at worst in her bilateral hips   Time 4   Period Weeks   Status New           PT Long Term Goals - 01/02/16 1056    PT LONG TERM GOAL #2   Title Patient will sit for 1 hour without pain upon standing in order to take car rides    Status On-going   PT LONG TERM GOAL #3   Title Patient will stand for 1/2 hour without increased pain in order to perfrom her ADL's    Status On-going  20 minutes 7/6   PT LONG TERM GOAL #4  Title Patient will show a 35% limitation on her FOTO score    Status On-going               Plan - 01/06/16 1022    Clinical Impression Statement Patient is a 75 year old femal w/  left hip pain and at times left lower back pain that runs down her left leg. Her pain increases when she sits for too long and when she stands. She has significant muscle tightness in her right glutes and weakness in her  left hip. she also has stiffness and weakness in her riight hip  Her pain is making her ADL's difficult. She would benefit from furter skilled therapy. She was seen for a low complexity evaluation.    Rehab Potential Excellent   Clinical Impairments Affecting Rehab Potential history of arthritis    PT Frequency 2x / week   PT Duration 8 weeks   PT Treatment/Interventions ADLs/Self Care Home Management;Cryotherapy;Electrical Stimulation;Moist Heat;Iontophoresis 4mg /ml Dexamethasone;Therapeutic exercise;Therapeutic activities;Functional mobility training;Stair training;Gait training;Patient/family education;Manual techniques;Energy conservation;Splinting;Passive range of  motion;Neuromuscular re-education;Ultrasound   PT Next Visit Plan review stretching, continue with core strengthening; consider hamstring stretching; Consider IT band roll out and soft tissue mobilization to the quadratus if spasming present; consider seated or side lying clamshell for hip stregthening and standing hip flexion.    PT Home Exercise Plan bridging, LTR, supine hip flexion, SLR 2x5 on the right x10 on the left; piriformis str   Consulted and Agree with Plan of Care Patient      Patient will benefit from skilled therapeutic intervention in order to improve the following deficits and impairments:  Decreased strength, Decreased mobility, Postural dysfunction, Improper body mechanics, Impaired flexibility, Hypomobility, Decreased activity tolerance, Decreased endurance, Difficulty walking, Decreased range of motion  Visit Diagnosis: Pain in left hip - Plan: PT plan of care cert/re-cert, PT plan of care cert/re-cert  Stiffness of left hip, not elsewhere classified - Plan: PT plan of care cert/re-cert, PT plan of care cert/re-cert  Muscle weakness (generalized) - Plan: PT plan of care cert/re-cert, PT plan of care cert/re-cert  Stiffness of right hip, not elsewhere classified - Plan: PT plan of care cert/re-cert  Pain in right hip - Plan: PT plan of care cert/re-cert     Problem List There are no active problems to display for this patient.   Carney Living PT DPT  01/06/2016, 10:23 AM  [facility] [address] [place], [place], [address] Phone: [phone]   Fax:  [phone]  Name: GERALINE KRIBS MRN: [id number] Date of Birth: Apr 18, 1941

## 2016-01-07 ENCOUNTER — Ambulatory Visit: Payer: Medicare Other

## 2016-01-07 DIAGNOSIS — M25651 Stiffness of right hip, not elsewhere classified: Secondary | ICD-10-CM | POA: Diagnosis not present

## 2016-01-07 DIAGNOSIS — M25551 Pain in right hip: Secondary | ICD-10-CM | POA: Diagnosis not present

## 2016-01-07 DIAGNOSIS — M25552 Pain in left hip: Secondary | ICD-10-CM

## 2016-01-07 DIAGNOSIS — M25652 Stiffness of left hip, not elsewhere classified: Secondary | ICD-10-CM

## 2016-01-07 DIAGNOSIS — M6281 Muscle weakness (generalized): Secondary | ICD-10-CM

## 2016-01-07 NOTE — Addendum Note (Signed)
Addended by: Carney Living on: 01/07/2016 03:24 PM   Modules accepted: Orders

## 2016-01-07 NOTE — Therapy (Signed)
[facility] [address], [place], [place], [address] Phone: [phone]   Fax:  [phone]  Physical Therapy Treatment  Patient Details  Name: [name] MRN: [id number] Date of Birth: 04/13/1941 Referring Provider: Benjaman Lobe PA-C   Encounter Date: 01/07/2016      PT End of Session - 01/07/16 1311    Visit Number 4   Number of Visits 10   Date for PT Re-Evaluation 02/18/16   Authorization Time Period Kx modifier in 15 visits.    PT Start Time 1235   PT Stop Time 1316   PT Time Calculation (min) 41 min   Activity Tolerance Patient tolerated treatment well   Behavior During Therapy WFL for tasks assessed/performed      Past Medical History  Diagnosis Date  . Atrial fibrillation ([place])   . Arthritis     right shoulder replacement, right replacement     Past Surgical History  Procedure Laterality Date  . Joint replacement      right shoulder 2008; right hip 2016    There were no vitals filed for this visit.      Subjective Assessment - 01/07/16 1249    Subjective Lt hip pain is mild today.  Doing well with her exercises and feels like she is getting stronger.     Currently in Pain? Yes   Pain Score 4    Pain Location Hip   Pain Orientation Left   Pain Descriptors / Indicators Dull   Pain Type Acute pain   Pain Onset More than a month ago   Pain Frequency Intermittent   Aggravating Factors  standing, walking, prolonged sitting   Pain Relieving Factors ice, tramadol only as needed, rest                         [facility] Adult PT Treatment/Exercise - 01/07/16 0001    Knee/Hip Exercises: Stretches   Active Hamstring Stretch Both;3 reps;20 seconds   Knee/Hip Exercises: Aerobic   Nustep Level 1 x6 minutes  seat 6   Knee/Hip Exercises: Standing   Hip Abduction Stengthening;Both;2 sets;10 reps   Hip Extension Stengthening;Both;2 sets;10 reps   Rebounder weight shifting 3  ways x1 min each   Knee/Hip Exercises: Seated   Sit to Sand 20 reps;without UE support                PT Education - 01/07/16 1257    Education provided Yes   Education Details standing hip exercises, seated hamstring stretch   Person(s) Educated Patient   Methods Explanation;Demonstration;Handout   Comprehension Verbalized understanding;Returned demonstration          PT Short Term Goals - 01/07/16 1248    PT SHORT TERM GOAL #2   Title Patient will be I w./ stretching and strengthening program at home   Time 4   Period Weeks   Status On-going   PT SHORT TERM GOAL #3   Title Patient will ambulate 400' with no device and no increase in pain    Time 4   Period Weeks   Status On-going   PT SHORT TERM GOAL #4   Title Patient will report 2/10 pain at worst in her bilateral hips   Time 4   Period Weeks   Status On-going           PT Long Term Goals - 01/02/16 1056    PT LONG TERM GOAL #2  Title Patient will sit for 1 hour without pain upon standing in order to take car rides    Status On-going   PT LONG TERM GOAL #3   Title Patient will stand for 1/2 hour without increased pain in order to perfrom her ADL's    Status On-going  20 minutes 7/6   PT LONG TERM GOAL #4   Title Patient will show a 35% limitation on her FOTO score    Status On-going               Plan - 01/07/16 1257    Clinical Impression Statement Pt with 4/10 Lt hip pain today.  Pt reports that she is feeling stronger and overall better since the start of care.  Pt tolerated advancement of strength exercises today.  Pt has bil hip weakness and stiffness and will benefit from flexiblity and strength exercises to improve endurance and reduce pain.     Rehab Potential Excellent   Clinical Impairments Affecting Rehab Potential history of arthritis    PT Frequency 2x / week   PT Duration 8 weeks   PT Treatment/Interventions ADLs/Self Care Home Management;Cryotherapy;Electrical  Stimulation;Moist Heat;Iontophoresis 4mg /ml Dexamethasone;Therapeutic exercise;Therapeutic activities;Functional mobility training;Stair training;Gait training;Patient/family education;Manual techniques;Energy conservation;Splinting;Passive range of motion;Neuromuscular re-education;Ultrasound   PT Next Visit Plan Hip and core strength, mobility, flexiblity   Consulted and Agree with Plan of Care Patient      Patient will benefit from skilled therapeutic intervention in order to improve the following deficits and impairments:  Decreased strength, Decreased mobility, Postural dysfunction, Improper body mechanics, Impaired flexibility, Hypomobility, Decreased activity tolerance, Decreased endurance, Difficulty walking, Decreased range of motion  Visit Diagnosis: Pain in right hip  Stiffness of right hip, not elsewhere classified  Muscle weakness (generalized)  Pain in left hip  Stiffness of left hip, not elsewhere classified     Problem List There are no active problems to display for this patient.  Sigurd Sos, PT 01/07/2016 1:18 PM  [facility] [address], [place], [place], [address] Phone: [phone]   Fax:  [phone]  Name: [name] MRN: [id number] Date of Birth: 05/27/1941

## 2016-01-07 NOTE — Patient Instructions (Signed)
  ABDUCTION: Standing (Active)   Stand, feet flat. Lift right leg out to side. Use _0__ lbs. Complete __10_ repetitions. Perform __2_ sessions per day.   EXTENSION: Standing (Active)  Stand, both feet flat. Draw right leg behind body as far as possible. Use 0___ lbs. Complete 10 repetitions. Perform __2_ sessions per day.  Copyright  VHI. All rights reserved.   HIP: Hamstrings - Short Sitting    Rest leg on raised surface. Keep knee straight. Lift chest. Hold _20__ seconds. _3__ reps per set, __2_ sets per day  Copyright  VHI. All rights reserved.  [facility] [address] [place], [place] [address] Phone # [phone] Fax [phone]

## 2016-01-09 ENCOUNTER — Ambulatory Visit: Payer: Medicare Other | Admitting: Physical Therapy

## 2016-01-09 DIAGNOSIS — M6281 Muscle weakness (generalized): Secondary | ICD-10-CM

## 2016-01-09 DIAGNOSIS — M25652 Stiffness of left hip, not elsewhere classified: Secondary | ICD-10-CM | POA: Diagnosis not present

## 2016-01-09 DIAGNOSIS — M25552 Pain in left hip: Secondary | ICD-10-CM

## 2016-01-09 DIAGNOSIS — M25651 Stiffness of right hip, not elsewhere classified: Secondary | ICD-10-CM | POA: Diagnosis not present

## 2016-01-09 DIAGNOSIS — M25551 Pain in right hip: Secondary | ICD-10-CM | POA: Diagnosis not present

## 2016-01-09 NOTE — Therapy (Signed)
[facility] [address], [place], [place], [address] Phone: [phone]   Fax:  [phone]  Physical Therapy Treatment  Patient Details  Name: [name] MRN: [id number] Date of Birth: Oct 28, 1940 Referring Provider: Benjaman Lobe   Encounter Date: 01/09/2016      PT End of Session - 01/09/16 1139    Visit Number 5   Number of Visits 10   Date for PT Re-Evaluation 02/18/16   Authorization Time Period Kx modifier in 15 visits.    PT Start Time 1100   PT Stop Time 1140   PT Time Calculation (min) 40 min   Activity Tolerance Patient tolerated treatment well   Behavior During Therapy WFL for tasks assessed/performed      Past Medical History  Diagnosis Date  . Atrial fibrillation ([place])   . Arthritis     right shoulder replacement, right replacement     Past Surgical History  Procedure Laterality Date  . Joint replacement      right shoulder 2008; right hip 2016    There were no vitals filed for this visit.      Subjective Assessment - 01/09/16 1058    Subjective Lt hip felt really good yesterday, did a lot without the cane, now feels more sore   Diagnostic tests x-ray: per patient no significant arthritis    Currently in Pain? Yes   Pain Score 4    Pain Location Hip   Pain Orientation Left   Pain Descriptors / Indicators Aching   Pain Type Acute pain   Pain Onset More than a month ago                         [facility] Adult PT Treatment/Exercise - 01/09/16 0001    Knee/Hip Exercises: Aerobic   Nustep level 2 x 6 minutes  seat 6   Knee/Hip Exercises: Standing   Hip Abduction Stengthening;Both;2 sets;10 reps  2#   Hip Extension Stengthening;Both;2 sets;10 reps  2#   Knee/Hip Exercises: Seated   Sit to Sand 20 reps;without UE support   Knee/Hip Exercises: Supine   Bridges Limitations x 20   Bridges with [employer] Strengthening;10 reps   Manual Therapy   Manual  Therapy Joint mobilization;Soft tissue mobilization   Joint Mobilization Lt hip distraction through pain free ROM   Soft tissue mobilization Lt hip flexors, glute med, ITB                PT Education - 01/09/16 1138    Education provided Yes   Education Details educated pt on ionto due to point tenderness on hip joint, pt states she will "think about it"   Person(s) Educated Patient;Spouse   Methods Explanation   Comprehension Verbalized understanding          PT Short Term Goals - 01/09/16 1140    PT SHORT TERM GOAL #1   Title Patient will increase gross left hip and knee strength to 4+/5    Status On-going   PT SHORT TERM GOAL #2   Title Patient will be I w./ stretching and strengthening program at home   Status On-going   PT SHORT TERM GOAL #3   Title Patient will ambulate 400' with no device and no increase in pain    Status On-going   PT SHORT TERM GOAL #4   Title Patient will report 2/10 pain at worst in her bilateral hips   Status  On-going           PT Long Term Goals - 01/09/16 1140    PT LONG TERM GOAL #2   Title Patient will sit for 1 hour without pain upon standing in order to take car rides    Status On-going   PT LONG TERM GOAL #3   Title Patient will stand for 1/2 hour without increased pain in order to perfrom her ADL's    Status On-going   PT LONG TERM GOAL #4   Title Patient will show a 35% limitation on her FOTO score    Status On-going               Plan - 01/09/16 1139    Clinical Impression Statement Pt continues with 4/10 hip pain, states she feels stronger and has more "good days". Pt with tenderness in Lt hip with palpation and pain with any Lt hip internal rotation, improved with hip distraction mobs.     PT Frequency 2x / week   PT Duration 8 weeks   PT Treatment/Interventions ADLs/Self Care Home Management;Cryotherapy;Electrical Stimulation;Moist Heat;Iontophoresis 4mg /ml Dexamethasone;Therapeutic exercise;Therapeutic  activities;Functional mobility training;Stair training;Gait training;Patient/family education;Manual techniques;Energy conservation;Splinting;Passive range of motion;Neuromuscular re-education;Ultrasound   PT Home Exercise Plan bridging, bridge with add squz   Consulted and Agree with Plan of Care Patient      Patient will benefit from skilled therapeutic intervention in order to improve the following deficits and impairments:  Decreased strength, Decreased mobility, Postural dysfunction, Improper body mechanics, Impaired flexibility, Hypomobility, Decreased activity tolerance, Decreased endurance, Difficulty walking, Decreased range of motion  Visit Diagnosis: Muscle weakness (generalized)  Pain in left hip  Stiffness of left hip, not elsewhere classified     Problem List There are no active problems to display for this patient.   Isabelle Course, PT, DPT  01/09/2016, 11:41 AM  [facility] [address] [place], [place], [address] Phone: [phone]   Fax:  [phone]  Name: [name] MRN: [id number] Date of Birth: [birth year]

## 2016-01-14 ENCOUNTER — Ambulatory Visit: Payer: Medicare Other | Admitting: Physical Therapy

## 2016-01-14 DIAGNOSIS — M25652 Stiffness of left hip, not elsewhere classified: Secondary | ICD-10-CM | POA: Diagnosis not present

## 2016-01-14 DIAGNOSIS — M6281 Muscle weakness (generalized): Secondary | ICD-10-CM | POA: Diagnosis not present

## 2016-01-14 DIAGNOSIS — M25651 Stiffness of right hip, not elsewhere classified: Secondary | ICD-10-CM | POA: Diagnosis not present

## 2016-01-14 DIAGNOSIS — M25552 Pain in left hip: Secondary | ICD-10-CM

## 2016-01-14 DIAGNOSIS — M25551 Pain in right hip: Secondary | ICD-10-CM | POA: Diagnosis not present

## 2016-01-14 NOTE — Therapy (Signed)
[facility] [address] [place], [place], [address] Phone: [phone]   Fax:  [phone]  Physical Therapy Treatment  Patient Details  Name: [name] MRN: [id number] Date of Birth: [birth year] Referring Provider: Benjaman Lobe   Encounter Date: 01/14/2016      PT End of Session - 01/14/16 1831    Visit Number 6   Number of Visits 10   Date for PT Re-Evaluation 02/18/16   Authorization Type medicare   Authorization Time Period Kx modifier in 15 visits.    PT Start Time 1020   PT Stop Time 1100   PT Time Calculation (min) 40 min   Activity Tolerance Patient tolerated treatment well      Past Medical History  Diagnosis Date  . Atrial fibrillation ([place])   . Arthritis     right shoulder replacement, right replacement     Past Surgical History  Procedure Laterality Date  . Joint replacement      right shoulder 2008; right hip 2016    There were no vitals filed for this visit.      Subjective Assessment - 01/14/16 1029    Subjective Yesterday it felt good and didn't have to use my cane as much.  Hurts to lift leg in/out of the car.     Currently in Pain? Yes   Pain Score 3    Pain Location Hip   Pain Orientation Left                         [facility] Adult PT Treatment/Exercise - 01/14/16 0001    Knee/Hip Exercises: Stretches   Other Knee/Hip Stretches psoas doorway stretch 3x 5 right /left   Knee/Hip Exercises: Aerobic   Nustep Nu-Step L1 10 min   Knee/Hip Exercises: Standing   Other Standing Knee Exercises wall climbs UE with hip flexion 5x   Other Standing Knee Exercises hip extension isometric 5x 5 sec hold   Knee/Hip Exercises: Seated   Sit to Sand 20 reps;without UE support   Manual Therapy   Joint Mobilization left long axis distraction; inferior; PA in slight IR grade 3 3x 30 sec each   Soft tissue mobilization Lt hip flexors, glute med, ITB                   PT Short Term Goals - 01/14/16 1835    PT SHORT TERM GOAL #1   Title Patient will increase gross left hip and knee strength to 4+/5    Time 4   Period Weeks   Status On-going   PT SHORT TERM GOAL #2   Title Patient will be I w./ stretching and strengthening program at home   Time 4   Period Weeks   Status On-going   PT SHORT TERM GOAL #3   Title Patient will ambulate 400' with no device and no increase in pain    Time 4   Period Weeks   Status On-going   PT SHORT TERM GOAL #4   Title Patient will report 2/10 pain at worst in her bilateral hips   Time 4   Period Weeks   Status On-going           PT Long Term Goals - 01/14/16 1836    PT LONG TERM GOAL #2   Title Patient will sit for 1 hour without pain upon standing in order to take car rides    Time  8   Period Weeks   Status On-going   PT LONG TERM GOAL #3   Title Patient will stand for 1/2 hour without increased pain in order to perfrom her ADL's    Time 8   Period Weeks   Status On-going   PT LONG TERM GOAL #4   Title Patient will show a 35% limitation on her FOTO score    Time 8   Period Weeks   Status On-going               Plan - 01/14/16 1831    Clinical Impression Statement The patient is able to participate in low to moderate intensity ex's to improve muscle length of psoas and improve hip strength without pain exacerbation.  She does fatigue with prolonged standing but after a short sitting break she is able resume standing ex's.  She declines the need for modalities.  Therapist closely monitoring response with all interventions.     PT Next Visit Plan Hip and core strength, mobility, flexiblity;  hip joint mobs and soft tissue work as needed;  check progress toward STGs.        Patient will benefit from skilled therapeutic intervention in order to improve the following deficits and impairments:     Visit Diagnosis: Muscle weakness (generalized)  Pain in left  hip  Stiffness of left hip, not elsewhere classified     Problem List There are no active problems to display for this patient. Ruben Im, PT 01/14/2016 6:37 PM Phone: [phone] Fax: [phone]  [name] 01/14/2016, 6:37 PM  [facility] [address] [place], [place], [address] Phone: [phone]   Fax:  [phone]  Name: [name] MRN: [id number] Date of Birth: September 26, 1940

## 2016-01-17 ENCOUNTER — Ambulatory Visit: Payer: Medicare Other | Admitting: Rehabilitative and Restorative Service Providers"

## 2016-01-17 DIAGNOSIS — M25552 Pain in left hip: Secondary | ICD-10-CM

## 2016-01-17 DIAGNOSIS — M6281 Muscle weakness (generalized): Secondary | ICD-10-CM | POA: Diagnosis not present

## 2016-01-17 DIAGNOSIS — M25551 Pain in right hip: Secondary | ICD-10-CM | POA: Diagnosis not present

## 2016-01-17 DIAGNOSIS — M25652 Stiffness of left hip, not elsewhere classified: Secondary | ICD-10-CM

## 2016-01-17 DIAGNOSIS — M25651 Stiffness of right hip, not elsewhere classified: Secondary | ICD-10-CM | POA: Diagnosis not present

## 2016-01-17 NOTE — Therapy (Signed)
[facility] [address], [place], [place], [address] Phone: [phone]   Fax:  [phone]  Physical Therapy Treatment  Patient Details  Name: [name] MRN: [id number] Date of Birth: 27-Jun-1941 Referring Provider: Benjaman Lobe   Encounter Date: 01/17/2016    Past Medical History  Diagnosis Date  . Atrial fibrillation ([place])   . Arthritis     right shoulder replacement, right replacement     Past Surgical History  Procedure Laterality Date  . Joint replacement      right shoulder 2008; right hip 2016    There were no vitals filed for this visit.      Subjective Assessment - 01/17/16 1031    Subjective I'm still feeling good, esp in the morning   Patient is accompained by: Family member   Pertinent History right hip replacement 2016; right shoulder replacement 2008;    Limitations Walking;Standing   How long can you sit comfortably? sitting makes it stiff but no set time.   How long can you stand comfortably? at [facility] standing is limited at times she cand stand for longer    How long can you walk comfortably? distances change    Diagnostic tests x-ray: per patient no significant arthritis                          [facility] Adult PT Treatment/Exercise - 01/17/16 0001    Knee/Hip Exercises: Standing   Other Standing Knee Exercises L hamstring curl 2 lbs, 2 lb donkey kick, 2 lb hip ext x 20, L 6 inch step ups x 15, L hip flexor stretch 2x30 sec   Knee/Hip Exercises: Seated   Hamstring Curl --  bike level 2 x 5 min with concentration on hamstrings   Knee/Hip Exercises: Supine   Other Supine Knee/Hip Exercises bridge x 20 with glute set, isometric bridge with bil UE flex/ext limited ROM as tolerated due to shoulder pain x 20, L SLR x 4 with pelvic tilt but DC due to L hip discomfort, leg lengthener alt x 10 each, L hip flexion off of bolster x 20; bridge off of bolster x 20, bridge/clam  shell combo x 20, hip flex off of bolster L x 20, hip abdct off of bolster x 20   Modalities   Modalities --  cold pack x 10 min L hip supine. no adverse reactions                PT Education - 01/17/16 1051    Education provided Yes   Education Details see pt instructions   Person(s) Educated Patient   Methods Explanation   Comprehension Verbalized understanding          PT Short Term Goals - 01/17/16 1057    PT SHORT TERM GOAL #1   Title Patient will increase gross left hip and knee strength to 4+/5    Time 4   Period Weeks   Status On-going   PT SHORT TERM GOAL #2   Title Patient will be I w./ stretching and strengthening program at home   Time 4   Period Weeks   Status On-going   PT SHORT TERM GOAL #3   Title Patient will ambulate 400' with no device and no increase in pain    Time 4   Period Weeks   Status On-going   PT SHORT TERM GOAL #4   Title Patient will report 2/10 pain  at worst in her bilateral hips   Time 4   Period Weeks   Status On-going           PT Long Term Goals - 01/17/16 1058    PT LONG TERM GOAL #2   Title Patient will sit for 1 hour without pain upon standing in order to take car rides    Time 8   Period Weeks   Status On-going   PT LONG TERM GOAL #3   Title Patient will stand for 1/2 hour without increased pain in order to perfrom her ADL's    Time 8   Period Weeks   Status On-going   PT LONG TERM GOAL #4   Title Patient will show a 35% limitation on her FOTO score    Time 8   Period Weeks   Status On-going               Plan - 01/17/16 1051    Clinical Impression Statement The patient has L hip abdct and pain central to the joint and would benefit from further therapy to work on hip abdct and extensor strength to assist with pain management and functional strengthening to decrease psoas tension.   Rehab Potential Excellent   Clinical Impairments Affecting Rehab Potential history of arthritis    PT Frequency 2x  / week   PT Duration 8 weeks   PT Treatment/Interventions ADLs/Self Care Home Management;Cryotherapy;Electrical Stimulation;Moist Heat;Iontophoresis 4mg /ml Dexamethasone;Therapeutic exercise;Therapeutic activities;Functional mobility training;Stair training;Gait training;Patient/family education;Manual techniques;Energy conservation;Splinting;Passive range of motion;Neuromuscular re-education;Ultrasound   PT Next Visit Plan Hip and core strength, mobility, flexiblity;  hip joint mobs and soft tissue work as needed;  check progress toward STGs.     PT Home Exercise Plan bridging, bridge with add squz, more hip extensor/abdct strengthening   Recommended Other Services none   Consulted and Agree with Plan of Care Patient   Family Member Consulted none      Patient will benefit from skilled therapeutic intervention in order to improve the following deficits and impairments:  Decreased strength, Decreased mobility, Postural dysfunction, Improper body mechanics, Impaired flexibility, Hypomobility, Decreased activity tolerance, Decreased endurance, Difficulty walking, Decreased range of motion  Visit Diagnosis: Pain in left hip  Stiffness of left hip, not elsewhere classified     Problem List There are no active problems to display for this patient.   Myra Rude, PT, DPT 01/17/2016, 11:05 AM  [facility] [address] [place], [place], [address] Phone: [phone]   Fax:  [phone]  Name: [name] MRN: [id number] Date of Birth: [birth year]

## 2016-01-17 NOTE — Patient Instructions (Signed)
Advised pt if she is sore to walk to decrease soreness. Advised pt to ice no longer than 20 min for an anti-inflammatory effect.

## 2016-01-21 ENCOUNTER — Ambulatory Visit: Payer: Medicare Other

## 2016-01-21 DIAGNOSIS — M25651 Stiffness of right hip, not elsewhere classified: Secondary | ICD-10-CM | POA: Diagnosis not present

## 2016-01-21 DIAGNOSIS — M6281 Muscle weakness (generalized): Secondary | ICD-10-CM | POA: Diagnosis not present

## 2016-01-21 DIAGNOSIS — M25552 Pain in left hip: Secondary | ICD-10-CM

## 2016-01-21 DIAGNOSIS — M25652 Stiffness of left hip, not elsewhere classified: Secondary | ICD-10-CM | POA: Diagnosis not present

## 2016-01-21 DIAGNOSIS — M25551 Pain in right hip: Secondary | ICD-10-CM | POA: Diagnosis not present

## 2016-01-21 NOTE — Therapy (Signed)
[facility] [address], [place], [place], [address] Phone: [phone]   Fax:  [phone]  Physical Therapy Treatment  Patient Details  Name: [name] MRN: [id number] Date of Birth: [birth year] Referring Provider: Benjaman Lobe   Encounter Date: 01/21/2016      PT End of Session - 01/21/16 1053    Visit Number 8   Number of Visits 10   Date for PT Re-Evaluation 02/18/16   Authorization Type medicare   Authorization Time Period Kx modifier in 15 visits.    PT Start Time 1015   PT Stop Time 1058   PT Time Calculation (min) 43 min   Activity Tolerance Patient tolerated treatment well   Behavior During Therapy WFL for tasks assessed/performed      Past Medical History:  Diagnosis Date  . Arthritis    right shoulder replacement, right replacement   . Atrial fibrillation [facility])     Past Surgical History:  Procedure Laterality Date  . JOINT REPLACEMENT     right shoulder 2008; right hip 2016    There were no vitals filed for this visit.      Subjective Assessment - 01/21/16 1023    Subjective Pt thinks that she might have overdone it last session.  Constant Lt hip pain since that visit.     Patient is accompained by: Family member   Currently in Pain? Yes   Pain Score 5    Pain Location Hip   Pain Orientation Left   Pain Descriptors / Indicators Aching   Pain Type Chronic pain   Pain Onset More than a month ago   Pain Frequency Constant   Aggravating Factors  standing, walking, prolonged sitting   Pain Relieving Factors ice, rest, tramadol only as needed.            [facility] PT Assessment - 01/21/16 0001      Assessment   Medical Diagnosis Trochanteric Bursitis bilateral hips Chronic Bilateral lower back pain with bilateral sciatica     Observation/Other Assessments   Focus on Therapeutic Outcomes (FOTO)  49% limitation                     [facility] Adult PT  Treatment/Exercise - 01/21/16 0001      Knee/Hip Exercises: Stretches   Active Hamstring Stretch Both;3 reps;20 seconds     Knee/Hip Exercises: Aerobic   Nustep Nu-Step L1 10 min  seat 5, arms 9     Knee/Hip Exercises: Standing   Heel Raises Both;2 sets;10 reps   Hip Abduction Stengthening;Both;2 sets;10 reps   Hip Extension Stengthening;Both;2 sets;10 reps   Rebounder weight shifting 3 ways x1 min each     Knee/Hip Exercises: Seated   Long Arc Quad Strengthening;Both;2 sets;10 reps;Weights   Long Arc Quad Weight 2 lbs.   Marching Limitations 2x10 bil   Marching Weights 2 lbs.   Sit to Sand 20 reps;without UE support                  PT Short Term Goals - 01/21/16 1026      PT SHORT TERM GOAL #2   Title Patient will be I w./ stretching and strengthening program at home   Status Achieved     PT SHORT TERM GOAL #3   Title Patient will ambulate 400' with no device and no increase in pain    Time 4   Period Weeks   Status On-going  PT SHORT TERM GOAL #4   Title Patient will report 2/10 pain at worst in her bilateral hips   Time 4   Period Weeks   Status On-going           PT Long Term Goals - 01/17/16 1058      PT LONG TERM GOAL #2   Title Patient will sit for 1 hour without pain upon standing in order to take car rides    Time 8   Period Weeks   Status On-going     PT LONG TERM GOAL #3   Title Patient will stand for 1/2 hour without increased pain in order to perfrom her ADL's    Time 8   Period Weeks   Status On-going     PT LONG TERM GOAL #4   Title Patient will show a 35% limitation on her FOTO score    Time 8   Period Weeks   Status On-going               Plan - 01/21/16 1027    Clinical Impression Statement Pt with flare-up of pain after last session.  Pt reports constant 5/10 Lt hip pain over the past few days.  Pt does report that she feels stronger but no change in pain.  Pt ambulates all distances with her cane.  FOTO is  improved to 49% limitation (improved from 51%).  Pt will continue to benefit from skilled for strength, flexiblity, gait and endurance progression.   Rehab Potential Excellent   PT Frequency 2x / week   PT Duration 8 weeks   PT Treatment/Interventions ADLs/Self Care Home Management;Cryotherapy;Electrical Stimulation;Moist Heat;Iontophoresis 4mg /ml Dexamethasone;Therapeutic exercise;Therapeutic activities;Functional mobility training;Stair training;Gait training;Patient/family education;Manual techniques;Energy conservation;Splinting;Passive range of motion;Neuromuscular re-education;Ultrasound   PT Next Visit Plan Hip and core strength, mobility, flexiblity;  hip joint mobs and soft tissue work as needed. G-codes in 2 visits- FOTO was complete today   Consulted and Agree with Plan of Care Patient      Patient will benefit from skilled therapeutic intervention in order to improve the following deficits and impairments:  Decreased strength, Decreased mobility, Postural dysfunction, Improper body mechanics, Impaired flexibility, Hypomobility, Decreased activity tolerance, Decreased endurance, Difficulty walking, Decreased range of motion  Visit Diagnosis: Pain in left hip  Stiffness of left hip, not elsewhere classified  Muscle weakness (generalized)  Pain in right hip  Stiffness of right hip, not elsewhere classified     Problem List There are no active problems to display for this patient.    Sigurd Sos, PT 01/21/16 10:59 AM  [facility] [address] [place], [place], [address] Phone: [phone]   Fax:  [phone]  Name: [name] MRN: [id number] Date of Birth: 1941-04-15

## 2016-01-24 ENCOUNTER — Ambulatory Visit: Payer: Medicare Other | Admitting: Physical Therapy

## 2016-01-24 ENCOUNTER — Encounter: Payer: Self-pay | Admitting: Physical Therapy

## 2016-01-24 DIAGNOSIS — M25651 Stiffness of right hip, not elsewhere classified: Secondary | ICD-10-CM | POA: Diagnosis not present

## 2016-01-24 DIAGNOSIS — M25652 Stiffness of left hip, not elsewhere classified: Secondary | ICD-10-CM | POA: Diagnosis not present

## 2016-01-24 DIAGNOSIS — M25552 Pain in left hip: Secondary | ICD-10-CM

## 2016-01-24 DIAGNOSIS — M6281 Muscle weakness (generalized): Secondary | ICD-10-CM | POA: Diagnosis not present

## 2016-01-24 DIAGNOSIS — M25551 Pain in right hip: Secondary | ICD-10-CM | POA: Diagnosis not present

## 2016-01-24 NOTE — Therapy (Signed)
[facility] [address] [place], [place], [address] Phone: [phone]   Fax:  [phone]  Physical Therapy Treatment  Patient Details  Name: [name] MRN: [id number] Date of Birth: January 20, 1941 Referring Provider: Benjaman Lobe   Encounter Date: 01/24/2016      PT End of Session - 01/24/16 1151    Visit Number 9   Number of Visits 10   Date for PT Re-Evaluation 02/18/16   Authorization Type medicare   Authorization Time Period Kx modifier in 15 visits.    PT Start Time 1100   PT Stop Time 1150   PT Time Calculation (min) 50 min   Activity Tolerance Patient tolerated treatment well   Behavior During Therapy WFL for tasks assessed/performed      Past Medical History:  Diagnosis Date  . Arthritis    right shoulder replacement, right replacement   . Atrial fibrillation [facility])     Past Surgical History:  Procedure Laterality Date  . JOINT REPLACEMENT     right shoulder 2008; right hip 2016    There were no vitals filed for this visit.      Subjective Assessment - 01/24/16 1119    Subjective Patient reports when she was getting in the car she had a sharp pain in left hip but went away. Not using her cane as much. Pain is not happening as often.    Patient is accompained by: Family member  husband   Pertinent History right hip replacement 2016; right shoulder replacement 2008;    Limitations Walking;Standing   How long can you sit comfortably? sitting makes it stiff but no set time.   How long can you stand comfortably? at [facility] standing is limited at times she cand stand for longer    How long can you walk comfortably? distances change    Diagnostic tests x-ray: per patient no significant arthritis    Currently in Pain? Yes   Pain Score 9    Pain Location Hip   Pain Orientation Left   Pain Descriptors / Indicators Sharp   Pain Type Chronic pain   Pain Onset More than a month ago   Pain  Frequency Intermittent   Aggravating Factors  standing, walking, prolonged sitting   Pain Relieving Factors ice, rest, tramadol only as needed   Multiple Pain Sites No            [facility] PT Assessment - 01/24/16 0001      Strength   Left Hip Flexion 4/5     Palpation   SI assessment  left ilium is rotated posteriorly                     [facility] Adult PT Treatment/Exercise - 01/24/16 0001      Knee/Hip Exercises: Aerobic   Nustep Nu-Step L1 10 min  seat 5, arms 9     Knee/Hip Exercises: Standing   Heel Raises Both;2 sets;10 reps  1# bil.    Hip Abduction Stengthening;Both;2 sets;10 reps   Abduction Limitations 1# bil. limited the left hip movement  due to pain   Hip Extension Stengthening;Both;2 sets;10 reps   Extension Limitations 1# bil.    Rebounder weight shifting 3 ways x1 min each     Knee/Hip Exercises: Seated   Long Arc Quad Strengthening;Both;2 sets;10 reps;Weights   Long Arc Quad Weight 3 lbs.   Marching Limitations 2x10 bil   Marching Weights 3 lbs.   Sit  to Sand 20 reps;without UE support     Manual Therapy   Manual Therapy Muscle Energy Technique;Soft tissue mobilization;Joint mobilization   Joint Mobilization distraction and lateral grade 3 to left hip   Soft tissue mobilization left quads, hip flexor, and adductors                PT Education - 01/24/16 1151    Education provided No          PT Short Term Goals - 01/21/16 1026      PT SHORT TERM GOAL #2   Title Patient will be I w./ stretching and strengthening program at home   Status Achieved     PT SHORT TERM GOAL #3   Title Patient will ambulate 400' with no device and no increase in pain    Time 4   Period Weeks   Status On-going     PT SHORT TERM GOAL #4   Title Patient will report 2/10 pain at worst in her bilateral hips   Time 4   Period Weeks   Status On-going           PT Long Term Goals - 01/17/16 1058      PT LONG TERM GOAL #2   Title Patient  will sit for 1 hour without pain upon standing in order to take car rides    Time 8   Period Weeks   Status On-going     PT LONG TERM GOAL #3   Title Patient will stand for 1/2 hour without increased pain in order to perfrom her ADL's    Time 8   Period Weeks   Status On-going     PT LONG TERM GOAL #4   Title Patient will show a 35% limitation on her FOTO score    Time 8   Period Weeks   Status On-going               Plan - 01/24/16 1151    Clinical Impression Statement Patient was able to increase weight with exercise.  Pelvis in correct alignment after therapy . Patient had trigger points in left hip adductor, flexor and quads.  After therapy patient ambulated with increased hip extension on left. Patient will benefit from physical therapy to reduce pain and increase strength.    Rehab Potential Excellent   Clinical Impairments Affecting Rehab Potential history of arthritis    PT Frequency 2x / week   PT Duration 8 weeks   PT Treatment/Interventions ADLs/Self Care Home Management;Cryotherapy;Electrical Stimulation;Moist Heat;Iontophoresis 4mg /ml Dexamethasone;Therapeutic exercise;Therapeutic activities;Functional mobility training;Stair training;Gait training;Patient/family education;Manual techniques;Energy conservation;Splinting;Passive range of motion;Neuromuscular re-education;Ultrasound   PT Next Visit Plan Hip and core strength, mobility, flexiblity;  hip joint mobs and soft tissue work as needed. G-codes next visit.    PT Home Exercise Plan review HEP and update   Consulted and Agree with Plan of Care Patient;Family member/caregiver   Family Member Consulted Husband      Patient will benefit from skilled therapeutic intervention in order to improve the following deficits and impairments:  Decreased strength, Decreased mobility, Postural dysfunction, Improper body mechanics, Impaired flexibility, Hypomobility, Decreased activity tolerance, Decreased endurance,  Difficulty walking, Decreased range of motion  Visit Diagnosis: Pain in left hip  Stiffness of left hip, not elsewhere classified  Muscle weakness (generalized)     Problem List There are no active problems to display for this patient.   Earlie Counts, PT 01/24/16 11:55 AM   [facility]  [place] [address], [place], [place], [address] Phone: [phone]   Fax:  [phone]  Name: [name] MRN: [id number] Date of Birth: [birth year]

## 2016-01-28 ENCOUNTER — Encounter: Payer: Self-pay | Admitting: Physical Therapy

## 2016-01-28 ENCOUNTER — Ambulatory Visit: Payer: Medicare Other | Attending: Orthopedic Surgery | Admitting: Physical Therapy

## 2016-01-28 DIAGNOSIS — M25551 Pain in right hip: Secondary | ICD-10-CM | POA: Diagnosis not present

## 2016-01-28 DIAGNOSIS — M25552 Pain in left hip: Secondary | ICD-10-CM | POA: Diagnosis not present

## 2016-01-28 DIAGNOSIS — M6281 Muscle weakness (generalized): Secondary | ICD-10-CM | POA: Insufficient documentation

## 2016-01-28 DIAGNOSIS — M25651 Stiffness of right hip, not elsewhere classified: Secondary | ICD-10-CM | POA: Diagnosis not present

## 2016-01-28 DIAGNOSIS — M25652 Stiffness of left hip, not elsewhere classified: Secondary | ICD-10-CM | POA: Diagnosis not present

## 2016-01-28 NOTE — Therapy (Signed)
[facility] [address], [name], [place], [address] Phone: [phone]   Fax:  [phone]  Physical Therapy Treatment  Patient Details  Name: [name] MRN: [id number] Date of Birth: July 16, 1940 Referring Provider: Benjaman Lobe PA-C  Encounter Date: 01/28/2016      PT End of Session - 01/28/16 1255    Visit Number 10   Number of Visits 20   Date for PT Re-Evaluation 02/18/16   Authorization Type medicare   Authorization Time Period Kx modifier in 15 visits.    PT Start Time 1145   PT Stop Time 1235   PT Time Calculation (min) 50 min   Activity Tolerance Patient tolerated treatment well   Behavior During Therapy WFL for tasks assessed/performed      Past Medical History:  Diagnosis Date  . Arthritis    right shoulder replacement, right replacement   . Atrial fibrillation [facility])     Past Surgical History:  Procedure Laterality Date  . JOINT REPLACEMENT     right shoulder 2008; right hip 2016    There were no vitals filed for this visit.      Subjective Assessment - 01/28/16 1155    Subjective When I woke up I was able to walk without a cane for one hour then had pain.  Last visit with the massage helped.    Patient is accompained by: Family member  husband   Pertinent History right hip replacement 2016; right shoulder replacement 2008;    Limitations Walking;Standing   How long can you sit comfortably? sitting makes it stiff but no set time.   How long can you stand comfortably? at [facility] standing is limited at times she cand stand for longer    How long can you walk comfortably? distances change    Diagnostic tests x-ray: per patient no significant arthritis    Currently in Pain? Yes   Pain Score 3    Pain Location Hip   Pain Orientation Left   Pain Descriptors / Indicators Sharp   Pain Type Chronic pain   Pain Onset More than a month ago   Pain Frequency Intermittent   Aggravating  Factors  standing, walking, prolonged sitting   Pain Relieving Factors ice, rest, tramadol only as needed   Effect of Pain on Daily Activities difficulty with walking   Multiple Pain Sites No            [facility] PT Assessment - 01/28/16 0001      Assessment   Medical Diagnosis Trochanteric Bursitis bilateral hips Chronic Bilateral lower back pain with bilateral sciatica   Referring Provider Bo Merino Stridh PA-C   Prior Therapy For the hip replacement      Precautions   Precautions None     Restrictions   Weight Bearing Restrictions No     Balance Screen   Has the patient fallen in the past 6 months No   Has the patient had a decrease in activity level because of a fear of falling?  No   Is the patient reluctant to leave their home because of a fear of falling?  No     Home Ecologist residence     Prior Function   Level of Independence Independent with household mobility with device     Cognition   Overall Cognitive Status Within Functional Limits for tasks assessed     Observation/Other Assessments   Focus on Therapeutic Outcomes (FOTO)  44% limitation     Strength   Right Hip Flexion 5/5   Right Hip ABduction 5/5   Right Hip ADduction 5/5   Left Hip Flexion 4+/5   Left Hip ABduction 3+/5   Right Knee Flexion 5/5   Right Knee Extension 4+/5   Left Knee Flexion 5/5   Left Knee Extension 5/5   Right/Left Ankle Right   Right Ankle Eversion 5/5     Palpation   SI assessment  pelvis in correct alignment                     [facility] Adult PT Treatment/Exercise - 01/28/16 0001      Knee/Hip Exercises: Aerobic   Nustep Nu-Step L1 10 min  seat 5, arms 9     Knee/Hip Exercises: Standing   Heel Raises Both;2 sets;10 reps  1# bil.    Hip Abduction Stengthening;Both;2 sets;10 reps   Abduction Limitations 1# bil. limited the left hip movement  due to pain   Hip Extension Stengthening;Both;2 sets;10 reps   Extension  Limitations 1# bil.      Manual Therapy   Manual Therapy Soft tissue mobilization   Soft tissue mobilization left quads, hip flexor, and adductors                PT Education - 01/28/16 1254    Education provided Yes   Education Details educated husband on how to perform soft tissue work to left hip adductors and quads   Person(s) Educated Patient;Spouse   Methods Explanation;Demonstration   Comprehension Verbalized understanding;Returned demonstration          PT Short Term Goals - 01/28/16 1212      PT SHORT TERM GOAL #1   Title Patient will increase gross left hip and knee strength to 4+/5    Time 4   Period Weeks   Status Achieved     PT SHORT TERM GOAL #2   Title Patient will be I w./ stretching and strengthening program at home   Time 4   Period Weeks   Status Achieved     PT SHORT TERM GOAL #3   Title Patient will ambulate 400' with no device and no increase in pain    Time 4   Period Weeks   Status On-going  does not use in morning but will mid day     PT SHORT TERM GOAL #4   Title Patient will report 2/10 pain at worst in her bilateral hips   Time 4   Period Weeks   Status On-going  3/10           PT Long Term Goals - 01/17/16 1058      PT LONG TERM GOAL #2   Title Patient will sit for 1 hour without pain upon standing in order to take car rides    Time 8   Period Weeks   Status On-going     PT LONG TERM GOAL #3   Title Patient will stand for 1/2 hour without increased pain in order to perfrom her ADL's    Time 8   Period Weeks   Status On-going     PT LONG TERM GOAL #4   Title Patient will show a 35% limitation on her FOTO score    Time 8   Period Weeks   Status On-going               Plan - 01/28/16 1255    Clinical  Impression Statement Patient has no pain when she wakes up but 1 hour later she will have pain. Patient still uses her cane with walking after 1 hour of waking up. Patient pain level is 3/10. Patient is  independent with her HEP.  FOTO score has improve to 44% limitation.  left hip abduction is weakest with pain. Patient will benefit from physical therapy to improve left hip abductor strength and reduce pain.    Rehab Potential Excellent   Clinical Impairments Affecting Rehab Potential history of arthritis    PT Frequency 2x / week   PT Duration 8 weeks   PT Treatment/Interventions ADLs/Self Care Home Management;Cryotherapy;Electrical Stimulation;Moist Heat;Iontophoresis 4mg /ml Dexamethasone;Therapeutic exercise;Therapeutic activities;Functional mobility training;Stair training;Gait training;Patient/family education;Manual techniques;Energy conservation;Splinting;Passive range of motion;Neuromuscular re-education;Ultrasound   PT Next Visit Plan Hip and core strength, mobility, flexiblity;  hip joint mobs and soft tissue work; hip abductor strength.    PT Home Exercise Plan review HEP and update   Consulted and Agree with Plan of Care Patient;Family member/caregiver   Family Member Consulted Husband      Patient will benefit from skilled therapeutic intervention in order to improve the following deficits and impairments:  Decreased strength, Decreased mobility, Postural dysfunction, Improper body mechanics, Impaired flexibility, Hypomobility, Decreased activity tolerance, Decreased endurance, Difficulty walking, Decreased range of motion  Visit Diagnosis: Pain in left hip  Stiffness of left hip, not elsewhere classified  Muscle weakness (generalized)  Pain in right hip  Stiffness of right hip, not elsewhere classified       G-Codes - 01/31/2016 1154    Functional Assessment Tool Used FOTO score is 44% limitation   Functional Limitation Changing and maintaining body position   Changing and Maintaining Body Position Goal Status [id number]) At least 1 percent but less than 20 percent impaired, limited or restricted   Changing and Maintaining Body Position Discharge Status [id number]) At least 40  percent but less than 60 percent impaired, limited or restricted      Problem List There are no active problems to display for this patient.   Earlie Counts, PT 2016-01-31 1:00 PM    [facility] [address] [place], [place], [address] Phone: [phone]   Fax:  [phone]  Name: KERIS EBANKS MRN: [id number] Date of Birth: 1940-12-17

## 2016-01-29 DIAGNOSIS — Z0000 Encounter for general adult medical examination without abnormal findings: Secondary | ICD-10-CM | POA: Diagnosis not present

## 2016-01-29 DIAGNOSIS — E7800 Pure hypercholesterolemia, unspecified: Secondary | ICD-10-CM | POA: Diagnosis not present

## 2016-01-29 DIAGNOSIS — I4891 Unspecified atrial fibrillation: Secondary | ICD-10-CM | POA: Diagnosis not present

## 2016-01-29 DIAGNOSIS — Z131 Encounter for screening for diabetes mellitus: Secondary | ICD-10-CM | POA: Diagnosis not present

## 2016-01-31 ENCOUNTER — Ambulatory Visit: Payer: Medicare Other | Admitting: Physical Therapy

## 2016-01-31 DIAGNOSIS — M6281 Muscle weakness (generalized): Secondary | ICD-10-CM | POA: Diagnosis not present

## 2016-01-31 DIAGNOSIS — M25651 Stiffness of right hip, not elsewhere classified: Secondary | ICD-10-CM | POA: Diagnosis not present

## 2016-01-31 DIAGNOSIS — M25551 Pain in right hip: Secondary | ICD-10-CM

## 2016-01-31 DIAGNOSIS — M25552 Pain in left hip: Secondary | ICD-10-CM

## 2016-01-31 DIAGNOSIS — M25652 Stiffness of left hip, not elsewhere classified: Secondary | ICD-10-CM

## 2016-01-31 NOTE — Therapy (Signed)
[facility] [address], [place], [place], [address] Phone: [phone]   Fax:  [phone]  Physical Therapy Treatment  Patient Details  Name: [name] MRN: [id number] Date of Birth: [birth year] Referring Provider: Benjaman Lobe PA-C  Encounter Date: 01/31/2016      PT End of Session - 01/31/16 1048    Visit Number 11   Number of Visits 20   Date for PT Re-Evaluation 02/18/16   Authorization Type medicare   Authorization Time Period Kx modifier in 15 visits.    PT Start Time 1015   PT Stop Time 1055   PT Time Calculation (min) 40 min   Activity Tolerance Patient tolerated treatment well      Past Medical History:  Diagnosis Date  . Arthritis    right shoulder replacement, right replacement   . Atrial fibrillation [facility])     Past Surgical History:  Procedure Laterality Date  . JOINT REPLACEMENT     right shoulder 2008; right hip 2016    There were no vitals filed for this visit.      Subjective Assessment - 01/31/16 1016    Subjective It's been touch and go this week.  Sore from manual soft tissue work last visit.  Walked in [employer] yesterday with muscular fatigue.  Feels OK this morning.     Currently in Pain? Yes   Pain Score 3    Pain Location Hip   Pain Orientation Left   Pain Type Chronic pain   Pain Frequency Intermittent                         [facility] Adult PT Treatment/Exercise - 01/31/16 0001      Knee/Hip Exercises: Stretches   Other Knee/Hip Stretches psoas doorway stretch 3x 5 right /left     Knee/Hip Exercises: Aerobic   Nustep seat 7 LEs only 10 min     Knee/Hip Exercises: Standing   Hip Abduction Stengthening;Both;2 sets;10 reps   Abduction Limitations 1# bil. limited the left hip movement  due to pain   Other Standing Knee Exercises step taps 10x alternating    Other Standing Knee Exercises hip extension and abd isometric 5x 5 sec hold     Knee/Hip Exercises: Seated   Long Arc Quad Strengthening;Left   Long Arc Quad Weight 4 lbs.   Sit to Sand 20 reps;without UE support     Manual Therapy   Joint Mobilization left long axis distraction, inferior, lateral grade 3 30 sec 3x each                  PT Short Term Goals - 01/31/16 1116      PT SHORT TERM GOAL #1   Title Patient will increase gross left hip and knee strength to 4+/5    Status Achieved     PT SHORT TERM GOAL #2   Title Patient will be I w./ stretching and strengthening program at home   Status Achieved     PT SHORT TERM GOAL #3   Title Patient will ambulate 400' with no device and no increase in pain    Time --   Period --   Status Achieved     PT SHORT TERM GOAL #4   Title Patient will report 2/10 pain at worst in her bilateral hips   Time 4   Period Weeks   Status On-going  PT Long Term Goals - 01/31/16 1118      PT LONG TERM GOAL #2   Title Patient will sit for 1 hour without pain upon standing in order to take car rides    Time 8   Period Weeks   Status On-going     PT LONG TERM GOAL #3   Title Patient will stand for 1/2 hour without increased pain in order to perfrom her ADL's    Time 8   Period Weeks   Status On-going     PT LONG TERM GOAL #4   Title Patient will show a 35% limitation on her FOTO score    Time 8   Period Weeks   Status On-going               Plan - 01/31/16 1048    Clinical Impression Statement The patient is improving with endurance and stamina with ambulation.   Improved muscle length in hip flexors allowing her to stand more erect.  Left > right gluteus medius weakness persists with pelvic drop with full weight bearing.  Therapist closely monitoring for pain, fatigue and cues for postural alignment.  Progressing with STGs.     Clinical Impairments Affecting Rehab Potential on blood thinner so bruises easily   PT Next Visit Plan hold of soft tissue work secondary to sensitivity but  hip joint mobs OK;  hip abd and extensor strength;  hip flexor stretch      Patient will benefit from skilled therapeutic intervention in order to improve the following deficits and impairments:     Visit Diagnosis: Pain in left hip  Stiffness of left hip, not elsewhere classified  Muscle weakness (generalized)  Pain in right hip  Stiffness of right hip, not elsewhere classified     Problem List There are no active problems to display for this patient. [name], PT 01/31/16 11:22 AM Phone: [phone] Fax: [phone]  [name] 01/31/2016, 11:21 AM  [facility] [address], [place], [place], [address] Phone: [phone]   Fax:  [phone]  Name: [name] MRN: [id number] Date of Birth: Oct 18, 1940

## 2016-02-05 DIAGNOSIS — Z1231 Encounter for screening mammogram for malignant neoplasm of breast: Secondary | ICD-10-CM | POA: Diagnosis not present

## 2016-02-05 DIAGNOSIS — Z803 Family history of malignant neoplasm of breast: Secondary | ICD-10-CM | POA: Diagnosis not present

## 2016-02-07 ENCOUNTER — Encounter: Payer: Self-pay | Admitting: Physical Therapy

## 2016-02-07 ENCOUNTER — Ambulatory Visit: Payer: Medicare Other | Admitting: Physical Therapy

## 2016-02-07 DIAGNOSIS — M25552 Pain in left hip: Secondary | ICD-10-CM

## 2016-02-07 DIAGNOSIS — M6281 Muscle weakness (generalized): Secondary | ICD-10-CM | POA: Diagnosis not present

## 2016-02-07 DIAGNOSIS — M25651 Stiffness of right hip, not elsewhere classified: Secondary | ICD-10-CM | POA: Diagnosis not present

## 2016-02-07 DIAGNOSIS — M25652 Stiffness of left hip, not elsewhere classified: Secondary | ICD-10-CM | POA: Diagnosis not present

## 2016-02-07 DIAGNOSIS — M25551 Pain in right hip: Secondary | ICD-10-CM | POA: Diagnosis not present

## 2016-02-07 NOTE — Therapy (Signed)
[facility] [address] [place], [place], [address] Phone: [phone]   Fax:  [phone]  Physical Therapy Treatment  Patient Details  Name: [name] MRN: [id number] Date of Birth: 1941/06/25 Referring Provider: Benjaman Lobe PA-C  Encounter Date: 02/07/2016      PT End of Session - 02/07/16 1047    Visit Number 12   Number of Visits 20   Date for PT Re-Evaluation 02/18/16   Authorization Type medicare   Authorization Time Period Kx modifier in 15 visits.    PT Start Time 1015   PT Stop Time 1055   PT Time Calculation (min) 40 min   Activity Tolerance Patient tolerated treatment well   Behavior During Therapy WFL for tasks assessed/performed      Past Medical History:  Diagnosis Date  . Arthritis    right shoulder replacement, right replacement   . Atrial fibrillation [facility])     Past Surgical History:  Procedure Laterality Date  . JOINT REPLACEMENT     right shoulder 2008; right hip 2016    There were no vitals filed for this visit.      Subjective Assessment - 02/07/16 1021    Subjective feels good when I first get up I feel good.  I feel stronger.  I will walk without cane often. As the day goes on I have increased pain.    Patient is accompained by: Family member  husband   Pertinent History right hip replacement 2016; right shoulder replacement 2008;    Limitations Walking;Standing   How long can you sit comfortably? sitting makes it stiff but no set time.   How long can you stand comfortably? at [facility] standing is limited at times she cand stand for longer    How long can you walk comfortably? distances change    Diagnostic tests x-ray: per patient no significant arthritis    Currently in Pain? Yes   Pain Score 3    Pain Location Hip   Pain Orientation Left   Pain Descriptors / Indicators Sharp   Pain Type Chronic pain   Pain Onset More than a month ago   Pain Frequency  Intermittent   Aggravating Factors  standing, walking, prolonged sitting   Pain Relieving Factors ice, rest, tramadol only as needed   Multiple Pain Sites No                         [facility] Adult PT Treatment/Exercise - 02/07/16 0001      Knee/Hip Exercises: Stretches   Other Knee/Hip Stretches psoas doorway stretch 3x 5 right /left     Knee/Hip Exercises: Aerobic   Nustep seat 7 LEs only 10 min L3     Knee/Hip Exercises: Standing   Hip Abduction Stengthening;Both;2 sets;10 reps   Abduction Limitations 2# bil. limited the left hip movement  due to pain   Hip Extension Stengthening;Both;2 sets;10 reps   Extension Limitations 2# bil.    Other Standing Knee Exercises step taps 10x alternating      Knee/Hip Exercises: Seated   Long Arc Quad Strengthening;Left;20 reps;Weights;2 sets   Long Arc Quad Weight 4 lbs.   Sit to Sand 20 reps;without UE support  holding a red plyoball     Manual Therapy   Manual Therapy Joint mobilization   Joint Mobilization left long axis distraction, inferior, lateral grade 3 30 sec 3x each  PT Education - 02/07/16 1047    Education provided No          PT Short Term Goals - 01/31/16 1116      PT SHORT TERM GOAL #1   Title Patient will increase gross left hip and knee strength to 4+/5    Status Achieved     PT SHORT TERM GOAL #2   Title Patient will be I w./ stretching and strengthening program at home   Status Achieved     PT SHORT TERM GOAL #3   Title Patient will ambulate 400' with no device and no increase in pain    Time --   Period --   Status Achieved     PT SHORT TERM GOAL #4   Title Patient will report 2/10 pain at worst in her bilateral hips   Time 4   Period Weeks   Status On-going           PT Long Term Goals - 02/07/16 1050      PT LONG TERM GOAL #2   Title Patient will sit for 1 hour without pain upon standing in order to take car rides    Period Weeks   Status Achieved      PT LONG TERM GOAL #3   Title Patient will stand for 1/2 hour without increased pain in order to perfrom her ADL's    Time 8   Period Weeks   Status On-going     PT LONG TERM GOAL #4   Title Patient will show a 35% limitation on her FOTO score    Time 8   Period Weeks   Status On-going               Plan - 02/07/16 1047    Clinical Impression Statement Patient was able to do increased weight with hip exercises and sit to stand.  Patient is able to move through the exercises at a faster speed.  Patient closely monitores for pain, fatique and postural alignment.  Patient will benefit from skilled therapy to improve strength and reduce pain.    Rehab Potential Excellent   Clinical Impairments Affecting Rehab Potential on blood thinner so bruises easily   PT Frequency 2x / week   PT Duration 8 weeks   PT Treatment/Interventions ADLs/Self Care Home Management;Cryotherapy;Electrical Stimulation;Moist Heat;Iontophoresis 4mg /ml Dexamethasone;Therapeutic exercise;Therapeutic activities;Functional mobility training;Stair training;Gait training;Patient/family education;Manual techniques;Energy conservation;Splinting;Passive range of motion;Neuromuscular re-education;Ultrasound   PT Next Visit Plan hold of soft tissue work secondary to sensitivity but hip joint mobs OK;  hip abd and extensor strength;  hip flexor stretch   PT Home Exercise Plan review HEP and update   Consulted and Agree with Plan of Care Patient;Family member/caregiver   Family Member Consulted Husband      Patient will benefit from skilled therapeutic intervention in order to improve the following deficits and impairments:  Decreased strength, Decreased mobility, Postural dysfunction, Improper body mechanics, Impaired flexibility, Hypomobility, Decreased activity tolerance, Decreased endurance, Difficulty walking, Decreased range of motion  Visit Diagnosis: Pain in left hip  Stiffness of left hip, not elsewhere  classified  Muscle weakness (generalized)     Problem List There are no active problems to display for this patient.   Earlie Counts, PT 02/07/16 10:53 AM   [facility] [address] [place], [place], [address] Phone: [phone]   Fax:  [phone]  Name: [name] MRN: [id number] Date of Birth: Sep 30, 1940

## 2016-02-11 ENCOUNTER — Ambulatory Visit: Payer: Medicare Other

## 2016-02-11 DIAGNOSIS — M6281 Muscle weakness (generalized): Secondary | ICD-10-CM

## 2016-02-11 DIAGNOSIS — M25552 Pain in left hip: Secondary | ICD-10-CM | POA: Diagnosis not present

## 2016-02-11 DIAGNOSIS — M25652 Stiffness of left hip, not elsewhere classified: Secondary | ICD-10-CM | POA: Diagnosis not present

## 2016-02-11 DIAGNOSIS — M25651 Stiffness of right hip, not elsewhere classified: Secondary | ICD-10-CM | POA: Diagnosis not present

## 2016-02-11 DIAGNOSIS — M25551 Pain in right hip: Secondary | ICD-10-CM | POA: Diagnosis not present

## 2016-02-11 NOTE — Patient Instructions (Addendum)
Butterfly, Supine    Lie on back, feet together. Lower knees toward floor. Hold _20__ seconds. Repeat __3_ times per session. Do __2-3_ sessions per day.  Copyright  VHI. All rights reserved.   Hip Flexor Stretch    Lying on back near edge of bed, bend one leg, foot flat. Hang other leg over edge, relaxed, thigh resting entirely on bed for 30 seconds,  Repeat __3__ times. Do __2-3__ sessions per day. Advanced Exercise: Bend knee back keeping thigh in contact with bed.  http://gt2.exer.us/347   Copyright  VHI. All rights reserved.  [name] [address], [place], [place] [address] Phone # [phone] Fax [phone]

## 2016-02-11 NOTE — Therapy (Signed)
[facility] [address], [place], [place], [address] Phone: [phone]   Fax:  [phone]  Physical Therapy Treatment  Patient Details  Name: [name] MRN: [id number] Date of Birth: 03-13-1941 Referring Provider: Benjaman Lobe PA-C  Encounter Date: 02/11/2016      PT End of Session - 02/11/16 1057    Visit Number 13   Number of Visits 20   Date for PT Re-Evaluation 02/18/16   Authorization Type medicare   Authorization Time Period Kx modifier in 15 visits.    PT Start Time 1015   PT Stop Time 1058   PT Time Calculation (min) 43 min   Activity Tolerance Patient tolerated treatment well   Behavior During Therapy WFL for tasks assessed/performed      Past Medical History:  Diagnosis Date  . Arthritis    right shoulder replacement, right replacement   . Atrial fibrillation [facility])     Past Surgical History:  Procedure Laterality Date  . JOINT REPLACEMENT     right shoulder 2008; right hip 2016    There were no vitals filed for this visit.      Subjective Assessment - 02/11/16 1021    Subjective I had pain yesterday in bil. trochanteric bursa.     Patient is accompained by: Family member   Pertinent History right hip replacement 2016; right shoulder replacement 2008;    Currently in Pain? Yes   Pain Score 3    Pain Location Hip  bursa/lateral   Pain Orientation Left   Pain Descriptors / Indicators Sharp   Pain Type Chronic pain   Pain Onset More than a month ago   Pain Frequency Intermittent   Aggravating Factors  standing, walking, prolonged sitting   Pain Relieving Factors ice, rest, medication as needed                         [facility] Adult PT Treatment/Exercise - 02/11/16 0001      Lumbar Exercises: Stretches   Active Hamstring Stretch 3 reps;20 seconds     Knee/Hip Exercises: Stretches   Hip Flexor Stretch Left;3 reps;20 seconds   Other Knee/Hip Stretches  butterfly stretch 3x20 seconds     Knee/Hip Exercises: Aerobic   Nustep seat 7 LEs only 10 min L3     Knee/Hip Exercises: Standing   Hip Abduction Stengthening;Both;2 sets;10 reps   Abduction Limitations 2# bil. limited the left hip movement  due to pain   Hip Extension Stengthening;Both;2 sets;10 reps   Extension Limitations 2# bil.      Knee/Hip Exercises: Seated   Long Arc Quad Strengthening;Left;20 reps;Weights;2 sets   [employer] Weight 4 lbs.     Manual Therapy   Manual Therapy Soft tissue mobilization;Myofascial release   Manual therapy comments orange roller over Lt quad and hip adductors with varying pressure                PT Education - 02/11/16 1036    Education provided Yes   Education Details hip flexor stretch, butterfly stretch   Person(s) Educated Patient;Spouse   Methods Explanation;Demonstration   Comprehension Verbalized understanding;Returned demonstration          PT Short Term Goals - 02/11/16 1025      PT SHORT TERM GOAL #4   Title Patient will report 2/10 pain at worst in her bilateral hips   Time 4   Period Weeks   Status On-going  PT Long Term Goals - 02/11/16 1026      PT LONG TERM GOAL #1   Title be independent in advanced HEP   Time 8   Period Weeks   Status New     PT LONG TERM GOAL #3   Title Patient will stand for 1/2 hour without increased pain in order to perfrom her ADL's    Time 8   Period Weeks   Status On-going     PT LONG TERM GOAL #4   Title Patient will show a 35% limitation on her FOTO score    Time 8   Period Weeks   Status On-going               Plan - 02/11/16 1027    Clinical Impression Statement Pt reports 50% overall improvement in strength/endurance with daily tasks and mobility.  Pt with Lt adductor and hamstring stiffness today.  PT issued HEP for flexiblity to help to reduce this pain.  Pt will continue to benefit from skilled PT for strength, flexiblity, endurance and  manual.     Rehab Potential Excellent   PT Frequency 2x / week   PT Duration 8 weeks   PT Treatment/Interventions ADLs/Self Care Home Management;Cryotherapy;Electrical Stimulation;Moist Heat;Iontophoresis 4mg /ml Dexamethasone;Therapeutic exercise;Therapeutic activities;Functional mobility training;Stair training;Gait training;Patient/family education;Manual techniques;Energy conservation;Splinting;Passive range of motion;Neuromuscular re-education;Ultrasound   PT Next Visit Plan Hip flexibility, strength and endurance.   Consulted and Agree with Plan of Care Patient      Patient will benefit from skilled therapeutic intervention in order to improve the following deficits and impairments:  Decreased strength, Decreased mobility, Postural dysfunction, Improper body mechanics, Impaired flexibility, Hypomobility, Decreased activity tolerance, Decreased endurance, Difficulty walking, Decreased range of motion  Visit Diagnosis: Pain in left hip  Stiffness of left hip, not elsewhere classified  Muscle weakness (generalized)     Problem List There are no active problems to display for this patient.    Sigurd Sos, PT 02/11/16 11:00 AM  [facility] [address], [place], [place], [address] Phone: [phone]   Fax:  [phone]  Name: [name] MRN: [id number] Date of Birth: 03/14/1941

## 2016-02-12 DIAGNOSIS — H40002 Preglaucoma, unspecified, left eye: Secondary | ICD-10-CM | POA: Diagnosis not present

## 2016-02-12 DIAGNOSIS — H401112 Primary open-angle glaucoma, right eye, moderate stage: Secondary | ICD-10-CM | POA: Diagnosis not present

## 2016-02-14 ENCOUNTER — Encounter: Payer: Self-pay | Admitting: Physical Therapy

## 2016-02-14 ENCOUNTER — Ambulatory Visit: Payer: Medicare Other | Admitting: Physical Therapy

## 2016-02-14 DIAGNOSIS — M25652 Stiffness of left hip, not elsewhere classified: Secondary | ICD-10-CM

## 2016-02-14 DIAGNOSIS — R922 Inconclusive mammogram: Secondary | ICD-10-CM | POA: Diagnosis not present

## 2016-02-14 DIAGNOSIS — R928 Other abnormal and inconclusive findings on diagnostic imaging of breast: Secondary | ICD-10-CM | POA: Diagnosis not present

## 2016-02-14 DIAGNOSIS — M25651 Stiffness of right hip, not elsewhere classified: Secondary | ICD-10-CM | POA: Diagnosis not present

## 2016-02-14 DIAGNOSIS — M25552 Pain in left hip: Secondary | ICD-10-CM | POA: Diagnosis not present

## 2016-02-14 DIAGNOSIS — M25551 Pain in right hip: Secondary | ICD-10-CM | POA: Diagnosis not present

## 2016-02-14 DIAGNOSIS — M6281 Muscle weakness (generalized): Secondary | ICD-10-CM | POA: Diagnosis not present

## 2016-02-14 NOTE — Therapy (Signed)
[facility] [address] [place], [place], [address] Phone: [phone]   Fax:  [phone]  Physical Therapy Treatment  Patient Details  Name: [name] MRN: [id number] Date of Birth: 06/11/1941 Referring Provider: Benjaman Lobe PA-C  Encounter Date: 02/14/2016      PT End of Session - 02/14/16 1044    Visit Number 14   Number of Visits 20   Date for PT Re-Evaluation 02/18/16   Authorization Type medicare   Authorization Time Period Kx modifier in 15 visits.    PT Start Time [phone]   PT Stop Time 1045   PT Time Calculation (min) 47 min      Past Medical History:  Diagnosis Date  . Arthritis    right shoulder replacement, right replacement   . Atrial fibrillation [facility])     Past Surgical History:  Procedure Laterality Date  . JOINT REPLACEMENT     right shoulder 2008; right hip 2016    There were no vitals filed for this visit.      Subjective Assessment - 02/14/16 1023    Subjective Pt feels overall 75% improved since eval. Pain comes and goes.    Currently in Pain? Yes   Pain Score 2    Pain Location Hip   Pain Orientation Left   Pain Descriptors / Indicators Dull;Aching   Multiple Pain Sites No                         [facility] Adult PT Treatment/Exercise - 02/14/16 0001      Self-Care   Self-Care --  Using towel roll for lumbar support when sitting   Other Self-Care Comments  Pt demo correctly     Lumbar Exercises: Stretches   Single Knee to Chest Stretch 3 reps;20 seconds     Lumbar Exercises: Supine   Other Supine Lumbar Exercises Leg lengthener 3x 5 sec LTLE     Knee/Hip Exercises: Standing   Hip Abduction Stengthening;Both;2 sets;10 reps;Knee straight  VC for core and space in back   Abduction Limitations 2#   Hip Extension Stengthening;Both;2 sets;10 reps  VC for core and standing tall   Extension Limitations 2#     Knee/Hip Exercises: Seated   Long  Arc Quad Strengthening;Left;20 reps;Weights;2 sets   Long Arc Quad Weight 4 lbs.     Manual Therapy   Manual Therapy --  Long axis leg pull on LT 4 x 15 sec                  PT Short Term Goals - 02/11/16 1025      PT SHORT TERM GOAL #4   Title Patient will report 2/10 pain at worst in her bilateral hips   Time 4   Period Weeks   Status On-going           PT Long Term Goals - 02/11/16 1026      PT LONG TERM GOAL #1   Title be independent in advanced HEP   Time 8   Period Weeks   Status New     PT LONG TERM GOAL #3   Title Patient will stand for 1/2 hour without increased pain in order to perfrom her ADL's    Time 8   Period Weeks   Status On-going     PT LONG TERM GOAL #4   Title Patient will show a 35% limitation on her FOTO score  Time 8   Period Weeks   Status On-going               Plan - 02/14/16 1047    Clinical Impression Statement Pt reports since eval she feels 75% better: less pain and better function.    Rehab Potential Excellent   Clinical Impairments Affecting Rehab Potential on blood thinner so bruises easily   PT Frequency 2x / week   PT Duration 8 weeks   PT Treatment/Interventions ADLs/Self Care Home Management;Cryotherapy;Electrical Stimulation;Moist Heat;Iontophoresis 4mg /ml Dexamethasone;Therapeutic exercise;Therapeutic activities;Functional mobility training;Stair training;Gait training;Patient/family education;Manual techniques;Energy conservation;Splinting;Passive range of motion;Neuromuscular re-education;Ultrasound   PT Next Visit Plan Hip flexibility, strength and endurance.   Consulted and Agree with Plan of Care Patient      Patient will benefit from skilled therapeutic intervention in order to improve the following deficits and impairments:  Decreased strength, Decreased mobility, Postural dysfunction, Improper body mechanics, Impaired flexibility, Hypomobility, Decreased activity tolerance, Decreased endurance,  Difficulty walking, Decreased range of motion  Visit Diagnosis: Pain in left hip  Stiffness of left hip, not elsewhere classified  Muscle weakness (generalized)     Problem List There are no active problems to display for this patient.   Freedom Peddy, PTA 02/14/2016, 11:01 AM  [facility] [address], [place], [place], [address] Phone: [phone]   Fax:  [phone]  Name: [name] MRN: [id number] Date of Birth: January 24, 1941

## 2016-02-18 ENCOUNTER — Ambulatory Visit: Payer: Medicare Other

## 2016-02-18 DIAGNOSIS — M6281 Muscle weakness (generalized): Secondary | ICD-10-CM

## 2016-02-18 DIAGNOSIS — M25552 Pain in left hip: Secondary | ICD-10-CM | POA: Diagnosis not present

## 2016-02-18 DIAGNOSIS — M25551 Pain in right hip: Secondary | ICD-10-CM | POA: Diagnosis not present

## 2016-02-18 DIAGNOSIS — M25652 Stiffness of left hip, not elsewhere classified: Secondary | ICD-10-CM | POA: Diagnosis not present

## 2016-02-18 DIAGNOSIS — M25651 Stiffness of right hip, not elsewhere classified: Secondary | ICD-10-CM | POA: Diagnosis not present

## 2016-02-18 NOTE — Therapy (Signed)
[facility] [address] [place], [place], [address] Phone: [phone]   Fax:  [phone]  Physical Therapy Treatment  Patient Details  Name: [name] MRN: [id number] Date of Birth: [birth year] Referring Provider: Benjaman Lobe PA-C  Encounter Date: 02/18/2016      PT End of Session - 02/18/16 1049    Visit Number 15   PT Start Time 1017   PT Stop Time 1053   PT Time Calculation (min) 36 min   Activity Tolerance Patient tolerated treatment well   Behavior During Therapy [facility] for tasks assessed/performed      Past Medical History:  Diagnosis Date  . Arthritis    right shoulder replacement, right replacement   . Atrial fibrillation [facility])     Past Surgical History:  Procedure Laterality Date  . JOINT REPLACEMENT     right shoulder 2008; right hip 2016    There were no vitals filed for this visit.      Subjective Assessment - 02/18/16 1015    Subjective Ready to D/C to HEP and gym exercises.  Pt reports 75% overall improvement since the start of care.     Pertinent History right hip replacement 2016; right shoulder replacement 2008;    Currently in Pain? No/denies   Pain Location Hip   Pain Orientation Left            [facility] PT Assessment - 02/18/16 0001      Assessment   Medical Diagnosis Trochanteric Bursitis bilateral hips Chronic Bilateral lower back pain with bilateral sciatica   Prior Therapy For the hip replacement      Observation/Other Assessments   Focus on Therapeutic Outcomes (FOTO)  37% limitation     Strength   Right Hip Flexion 5/5   Right Hip ABduction 5/5   Right Hip ADduction 5/5   Left Hip Flexion 5/5   Left Hip ABduction 4-/5   Right Knee Flexion 5/5   Right Knee Extension 4+/5   Left Knee Flexion 5/5   Left Knee Extension 5/5   Right/Left Ankle Right                     [facility] Adult PT Treatment/Exercise - 02/18/16 0001      Lumbar  Exercises: Stretches   Active Hamstring Stretch 3 reps;20 seconds   Single Knee to Chest Stretch 3 reps;20 seconds     Knee/Hip Exercises: Aerobic   Nustep seat 7 LEs only 10 min L3     Knee/Hip Exercises: Standing   Hip Abduction Stengthening;Both;2 sets;10 reps;Knee straight  VC for core and space in back   Abduction Limitations 4#   Hip Extension Stengthening;Both;2 sets;10 reps  VC for core and standing tall   Extension Limitations 4#     Knee/Hip Exercises: Seated   Long Arc Quad Strengthening;Left;20 reps;Weights;2 sets   Long Arc Quad Weight 4 lbs.                  PT Short Term Goals - 02/11/16 1025      PT SHORT TERM GOAL #4   Title Patient will report 2/10 pain at worst in her bilateral hips   Time 4   Period Weeks   Status On-going           PT Long Term Goals - 02/18/16 1022      PT LONG TERM GOAL #1   Title be independent in advanced HEP   Status  Achieved     PT LONG TERM GOAL #2   Title Patient will sit for 1 hour without pain upon standing in order to take car rides    Status Achieved     PT LONG TERM GOAL #3   Title Patient will stand for 1/2 hour without increased pain in order to perfrom her ADL's    Status Achieved     PT LONG TERM GOAL #4   Title Patient will show a 35% limitation on her FOTO score    Status Partially Met  37% limitation               Plan - 2016-03-19 1024    Clinical Impression Statement Pt reports 75% overall improvement in symptoms since the start of care.  FOTO score is improved to 37% limitation (improved from 51% limitation).  Pt has comprehensive HEP and will continue with this for strength and endurance gains and plans to attend classes at the [facility] and ride the recumbent bike.  Pt is able to stand 30-45 minutes at home and didn't have any pain in the clinic today.     PT Next Visit Plan D/C PT to HEP and gym exercises      Patient will benefit from skilled therapeutic intervention in order to  improve the following deficits and impairments:     Visit Diagnosis: Pain in left hip  Stiffness of left hip, not elsewhere classified  Muscle weakness (generalized)       G-Codes - March 19, 2016 1023    Functional Assessment Tool Used FOTO: 37% limitation   Functional Limitation Changing and maintaining body position   Changing and Maintaining Body Position Goal Status (Z6109) At least 1 percent but less than 20 percent impaired, limited or restricted   Changing and Maintaining Body Position Discharge Status (U0454) At least 20 percent but less than 40 percent impaired, limited or restricted      Problem List There are no active problems to display for this patient.  PHYSICAL THERAPY DISCHARGE SUMMARY  Visits from Start of Care: 15  Current functional level related to goals / functional outcomes: See above for current status.  Pt will be discharged to HEP and gym exercises.     Remaining deficits: Intermittent hip pain and functional strength deficits.  Pt has HEP in place to address remaining deficits.     Education / Equipment: HEP Plan: Patient agrees to discharge.  Patient goals were partially met. Patient is being discharged due to being pleased with the current functional level.  ?????        Sigurd Sos, PT 03-19-2016 10:51 AM  [facility] [address], [place], [place], [address] Phone: [phone]   Fax:  [phone]  Name: [name] MRN: [id number] Date of Birth: [birth year]

## 2016-02-24 DIAGNOSIS — M8588 Other specified disorders of bone density and structure, other site: Secondary | ICD-10-CM | POA: Diagnosis not present

## 2016-02-24 DIAGNOSIS — E2839 Other primary ovarian failure: Secondary | ICD-10-CM | POA: Diagnosis not present

## 2016-02-28 DIAGNOSIS — M25552 Pain in left hip: Secondary | ICD-10-CM | POA: Diagnosis not present

## 2016-03-16 ENCOUNTER — Telehealth: Payer: Self-pay

## 2016-03-16 NOTE — Telephone Encounter (Signed)
PT called pt to return phone call from 03/13/16.  Pt with new onset of sciatica and is not able to exercise or go to the gym.  PT suggested that pt discuss these symptoms with her MD.

## 2016-03-17 DIAGNOSIS — E559 Vitamin D deficiency, unspecified: Secondary | ICD-10-CM | POA: Diagnosis not present

## 2016-03-17 DIAGNOSIS — R233 Spontaneous ecchymoses: Secondary | ICD-10-CM | POA: Diagnosis not present

## 2016-03-17 DIAGNOSIS — Z23 Encounter for immunization: Secondary | ICD-10-CM | POA: Diagnosis not present

## 2016-03-17 DIAGNOSIS — Z79899 Other long term (current) drug therapy: Secondary | ICD-10-CM | POA: Diagnosis not present

## 2016-03-17 DIAGNOSIS — M8580 Other specified disorders of bone density and structure, unspecified site: Secondary | ICD-10-CM | POA: Diagnosis not present

## 2016-03-17 DIAGNOSIS — M7630 Iliotibial band syndrome, unspecified leg: Secondary | ICD-10-CM | POA: Diagnosis not present

## 2016-03-17 DIAGNOSIS — M461 Sacroiliitis, not elsewhere classified: Secondary | ICD-10-CM | POA: Diagnosis not present

## 2016-04-01 ENCOUNTER — Ambulatory Visit: Payer: Medicare Other | Attending: Family Medicine

## 2016-04-01 DIAGNOSIS — M6281 Muscle weakness (generalized): Secondary | ICD-10-CM | POA: Insufficient documentation

## 2016-04-01 DIAGNOSIS — G8929 Other chronic pain: Secondary | ICD-10-CM | POA: Diagnosis not present

## 2016-04-01 DIAGNOSIS — R252 Cramp and spasm: Secondary | ICD-10-CM | POA: Insufficient documentation

## 2016-04-01 DIAGNOSIS — M5442 Lumbago with sciatica, left side: Secondary | ICD-10-CM | POA: Diagnosis not present

## 2016-04-01 DIAGNOSIS — M25552 Pain in left hip: Secondary | ICD-10-CM | POA: Insufficient documentation

## 2016-04-01 DIAGNOSIS — M25652 Stiffness of left hip, not elsewhere classified: Secondary | ICD-10-CM | POA: Insufficient documentation

## 2016-04-01 NOTE — Therapy (Signed)
[facility] [address] [place], [place], [address] Phone: [phone]   Fax:  [phone]  Physical Therapy Evaluation  Patient Details  Name: [name] MRN: [id number] Date of Birth: [birth year] Referring Provider: Benjaman Lobe PA-C  Encounter Date: 04/01/2016      PT End of Session - 04/01/16 1051    Visit Number 1   Number of Visits 10   Date for PT Re-Evaluation 05/27/16   Authorization Type Pt has had 15 visits this year, KX on all visits.   PT Start Time 1017   PT Stop Time 1053   PT Time Calculation (min) 36 min   Activity Tolerance Patient tolerated treatment well   Behavior During Therapy WFL for tasks assessed/performed      Past Medical History:  Diagnosis Date  . Arthritis    right shoulder replacement, right replacement   . Atrial fibrillation [facility])     Past Surgical History:  Procedure Laterality Date  . JOINT REPLACEMENT     right shoulder 2008; right hip 2016    There were no vitals filed for this visit.       Subjective Assessment - 04/01/16 1013    Subjective Pt presents to PT with chronic Lt IT Band syndrome, bil low back pain and Lt LE sciatica of a chronic nautre.  Pt had PT at this clinic for hip pain, weakness and LBP that ended ~4 weeks ago.  Pt had a flare-up of pain.  Began taking Celebrex and this is helping with the pain.     Pertinent History right hip replacement 2016; right shoulder replacement 2008.  Pt had injection into Lt groin- very minmal relief   How long can you stand comfortably? this varies    How long can you walk comfortably? this varies   Diagnostic tests x-ray: hips- OA on Lt   Patient Stated Goals reduce pain in Lt LE, return to the gym to regularly exercise   Currently in Pain? Yes   Pain Score 5    Pain Location Hip  bil. low back   Pain Orientation Left   Pain Descriptors / Indicators Aching;Dull   Pain Type Chronic pain   Pain  Radiating Towards Lt LE   Pain Onset More than a month ago   Pain Frequency Intermittent   Aggravating Factors  standing/walking too long, sitting too long, end of the day when medicine wears off   Pain Relieving Factors Celebrex, moving around/not being still for too long            [facility] PT Assessment - 04/01/16 0001      Assessment   Medical Diagnosis Lt IT band, SI joints, Lt sciatica, osteopenia   Prior Therapy For the hip replacement , at this clinic for LBP and hip pain     Precautions   Precautions Fall;Other (comment)  osteopenia     Restrictions   Weight Bearing Restrictions No     Balance Screen   Has the patient fallen in the past 6 months No   Has the patient had a decrease in activity level because of a fear of falling?  No   Is the patient reluctant to leave their home because of a fear of falling?  No     Home Environment   Living Environment Private residence   Living Arrangements Spouse/significant other   Type of [place]     Prior Function   Level of [place]  Vocation Retired   Leisure exercise at [employer]- has not been going due to pain     Cognition   Overall Cognitive Status Within Functional Limits for tasks assessed     Observation/Other Assessments   Focus on Therapeutic Outcomes (FOTO)  48% limitation     Posture/Postural Control   Posture/Postural Control Postural limitations   Postural Limitations Rounded Shoulders;Forward head;Flexed trunk     ROM / Strength   AROM / PROM / Strength AROM;PROM;Strength     AROM   Overall AROM  Within functional limits for tasks performed   Overall AROM Comments lumbar AROM is full, Lt hip AROM is limited by 10% vs the Rt     PROM   Overall PROM  Deficits   Overall PROM Comments Lt hip PROM limited by 10% vs the Rt.  both limited by 20%     Strength   Right Hip Flexion 5/5   Right Hip ABduction 5/5   Left Hip Flexion 4+/5   Left Hip ABduction 4-/5   Right Knee Flexion 5/5    Right Knee Extension 4+/5   Left Knee Flexion 5/5   Left Knee Extension 4+/5     Palpation   Palpation comment palpable tenderness over bil. lumbar paraspinals and Lt gluteals/IT band     Special Tests    Special Tests Lumbar   Lumbar Tests Straight Leg Raise;Slump Test     Slump test   Findings Negative   Side Left     Straight Leg Raise   Findings Negative   Side  Left     Ambulation/Gait   Ambulation/Gait Yes   Ambulation/Gait Assistance 7: Independent   Ambulation Distance (Feet) 100 Feet   Gait Pattern Step-through pattern;Decreased step length - left;Decreased step length - right                           PT Education - 04/01/16 1047    Education provided Yes   Education Details single knee to chest, hamstring stretch, 4 way hips   Person(s) Educated Patient;Spouse   Methods Explanation;Demonstration;Handout   Comprehension Returned demonstration;Verbalized understanding          PT Short Term Goals - 04/01/16 1009      PT SHORT TERM GOAL #1   Title be independent in initial HEP   Time 4   Period Weeks   Status New     PT SHORT TERM GOAL #2   Title verbalize and demonstrate understanding of body mechanics and correct posture to reduce risk of fracture associated with osteopenia   Time 4   Period Weeks   Status New     PT SHORT TERM GOAL #3   Title report a 30% reduction in LBP and Lt LE pain with ADLs and self-care   Time 4   Period Weeks   Status New           PT Long Term Goals - 04/01/16 1018      PT LONG TERM GOAL #1   Title be independent in advanced HEP   Time 8   Period Weeks   Status New     PT LONG TERM GOAL #2   Title reduce FOTO to < or = to 37% limitation   Time 8   Period Weeks   Status New     PT LONG TERM GOAL #3   Title report a 60% reduction in LBP and Lt LE pain  with standing and walking   Time 8   Period Weeks   Status New     PT LONG TERM GOAL #4   Title return to regular gym exercise  routine without limitation   Time 8   Period Days   Status New               Plan - 04/01/16 1053    Clinical Impression Statement Pt presents to PT with onset of Lt LE pain (sciatica and ITB) that flared up ~3 weeks ago.  Pt had PT at this clinic that ended ~4 weeks ago and was discharged to HEP and gym exercises.  Pt with variable tolerance for standing and walking due to up to 5/10 Lt LE pain.  Pt demonstrates active trigger points in Lt gluteals and along IT band.  Pt is of moderate complexity due to evolving condition, multiple body parts and co-morbidities including Rt hip replacement and osteopenia.  Pt will benefit from skilled PT for dry needling, hip flexibility and strength and manual/modalities to reduce pain.   Rehab Potential Good   Clinical Impairments Affecting Rehab Potential on blood thinner so bruises easily   PT Frequency 2x / week   PT Duration 8 weeks   PT Treatment/Interventions ADLs/Self Care Home Management;Cryotherapy;Electrical Stimulation;Moist Heat;Iontophoresis 4mg /ml Dexamethasone;Therapeutic exercise;Therapeutic activities;Functional mobility training;Stair training;Gait training;Patient/family education;Manual techniques;Energy conservation;Splinting;Passive range of motion;Neuromuscular re-education;Ultrasound;Dry needling   PT Next Visit Plan Dry needling to Lt ITB and gluteals (discuss blood thinners?), flexibility and strength, manual therapy   Consulted and Agree with Plan of Care Patient      Patient will benefit from skilled therapeutic intervention in order to improve the following deficits and impairments:  Decreased strength, Decreased mobility, Postural dysfunction, Improper body mechanics, Impaired flexibility, Hypomobility, Decreased activity tolerance, Decreased endurance, Difficulty walking, Decreased range of motion  Visit Diagnosis: Pain in left hip - Plan: PT plan of care cert/re-cert  Stiffness of left hip, not elsewhere classified -  Plan: PT plan of care cert/re-cert  Muscle weakness (generalized) - Plan: PT plan of care cert/re-cert  Chronic left-sided low back pain with left-sided sciatica - Plan: PT plan of care cert/re-cert  Cramp and spasm - Plan: PT plan of care cert/re-cert      G-Codes - [id number]    Functional Assessment Tool Used FOTO: 48% limitation   Functional Limitation Mobility: Walking and moving around   Mobility: Walking and Moving Around Current Status [id number]) At least 40 percent but less than 60 percent impaired, limited or restricted   Mobility: Walking and Moving Around Goal Status [id number]) At least 20 percent but less than 40 percent impaired, limited or restricted       Problem List There are no active problems to display for this patient.    Sigurd Sos, PT 04/01/16 11:01 AM  [facility] [address], [place], [place], [address] Phone: [phone]   Fax:  [phone]  Name: [name] MRN: [id number] Date of Birth: Jan 13, 1941

## 2016-04-01 NOTE — Patient Instructions (Addendum)
Perform all exercises below:  Hold _20___ seconds. Repeat _3___ times.  Do __3__ sessions per day. CAUTION: Movement should be gentle, steady and slow.  Knee to Chest  Lying supine, bend involved knee to chest. Perform with each leg.  HIP: Hamstrings - Short Sitting   Rest leg on raised surface. Keep knee straight. Lift chest.   Knee High   Holding stable object, raise knee to hip level, then lower knee. Repeat with other knee. Complete __10_ repetitions. Do __2__ sessions per day.  ABDUCTION: Standing (Active)   Stand, feet flat. Lift right leg out to side. Use _0__ lbs. Complete __10_ repetitions. Perform __2_ sessions per day.   EXTENSION: Standing (Active)  Stand, both feet flat. Draw right leg behind body as far as possible. Use 0___ lbs. Complete 10 repetitions. Perform __2_ sessions per day.  Copyright  VHI. All rights reserved.     [place] [address] [place],  [address] Phone # [phone] Fax [phone]

## 2016-04-08 ENCOUNTER — Encounter: Payer: Self-pay | Admitting: Physical Therapy

## 2016-04-08 ENCOUNTER — Ambulatory Visit: Payer: Medicare Other | Admitting: Physical Therapy

## 2016-04-08 DIAGNOSIS — M25552 Pain in left hip: Secondary | ICD-10-CM | POA: Diagnosis not present

## 2016-04-08 DIAGNOSIS — M25652 Stiffness of left hip, not elsewhere classified: Secondary | ICD-10-CM

## 2016-04-08 DIAGNOSIS — R252 Cramp and spasm: Secondary | ICD-10-CM

## 2016-04-08 DIAGNOSIS — G8929 Other chronic pain: Secondary | ICD-10-CM | POA: Diagnosis not present

## 2016-04-08 DIAGNOSIS — M5442 Lumbago with sciatica, left side: Secondary | ICD-10-CM

## 2016-04-08 DIAGNOSIS — M6281 Muscle weakness (generalized): Secondary | ICD-10-CM | POA: Diagnosis not present

## 2016-04-08 NOTE — Therapy (Signed)
[facility] [address] [place], [place], [address] Phone: [phone]   Fax:  [phone]  Physical Therapy Treatment  Patient Details  Name: [name] MRN: [id number] Date of Birth: 06-Aug-1940 Referring Provider: Benjaman Lobe PA-C  Encounter Date: 04/08/2016      PT End of Session - 04/08/16 1120    Visit Number 2   Number of Visits 10   Date for PT Re-Evaluation 05/27/16   Authorization Type Pt has had 15 visits this year, KX on all visits.   Authorization Time Period Kx modifier in 15 visits.    PT Start Time 1108   PT Stop Time 1150   PT Time Calculation (min) 42 min   Activity Tolerance Patient tolerated treatment well   Behavior During Therapy WFL for tasks assessed/performed      Past Medical History:  Diagnosis Date  . Arthritis    right shoulder replacement, right replacement   . Atrial fibrillation [facility])     Past Surgical History:  Procedure Laterality Date  . JOINT REPLACEMENT     right shoulder 2008; right hip 2016    There were no vitals filed for this visit.      Subjective Assessment - 04/08/16 1115    Subjective Pt presenting today with no complaints of back pain. Pt reporting being very active yesterday and feeling fatigue.    Pertinent History right hip replacement 2016; right shoulder replacement 2008.  Pt had injection into Lt groin- very minmal relief   How long can you stand comfortably? this varies    How long can you walk comfortably? this varies   Diagnostic tests x-ray: hips- OA on Lt   Patient Stated Goals reduce pain in Lt LE, return to the gym to regularly exercise   Pain Score 0-No pain   Pain Onset More than a month ago   Pain Frequency Intermittent   Multiple Pain Sites No                         [facility] Adult PT Treatment/Exercise - 04/08/16 0001      Exercises   Exercises Knee/Hip     Lumbar Exercises: Stretches   Active  Hamstring Stretch 3 reps;30 seconds   Single Knee to Chest Stretch 3 reps;20 seconds     Lumbar Exercises: Supine   Bridge 10 reps;5 seconds   Other Supine Lumbar Exercises PPT: 10 reps holding 5 seconds     Knee/Hip Exercises: Sidelying   Hip ABduction 2 sets;10 reps   Hip ABduction Limitations pt reported her leg felt heavy   Clams 20 reps     Manual Therapy   Manual Therapy Soft tissue mobilization   Manual therapy comments IT band left LE, 10 minutes                PT Education - 04/08/16 1118    Education provided Yes   Education Details reviewed HEP, edu pt on posture correction, edu handout on dry needling issued to pt   Person(s) Educated Patient   Methods Explanation;Demonstration;Tactile cues;Verbal cues;Handout   Comprehension Verbalized understanding;Returned demonstration          PT Short Term Goals - 04/08/16 1126      PT SHORT TERM GOAL #1   Title be independent in initial HEP   Time 4   Period Weeks   Status On-going     PT SHORT TERM GOAL #2  Title verbalize and demonstrate understanding of body mechanics and correct posture to reduce risk of fracture associated with osteopenia   Time 4   Period Weeks   Status New     PT SHORT TERM GOAL #3   Title report a 30% reduction in LBP and Lt LE pain with ADLs and self-care   Time 4   Period Weeks     PT SHORT TERM GOAL #4   Title Patient will report 2/10 pain at worst in her bilateral hips   Time 4   Period Weeks   Status On-going           PT Long Term Goals - 04/01/16 1018      PT LONG TERM GOAL #1   Title be independent in advanced HEP   Time 8   Period Weeks   Status New     PT LONG TERM GOAL #2   Title reduce FOTO to < or = to 37% limitation   Time 8   Period Weeks   Status New     PT LONG TERM GOAL #3   Title report a 60% reduction in LBP and Lt LE pain with standing and walking   Time 8   Period Weeks   Status New     PT LONG TERM GOAL #4   Title return to  regular gym exercise routine without limitation   Time 8   Period Days   Status New               Plan - 04/08/16 1121    Clinical Impression Statement pt presents with no complaints of pain today. pt did report intermittent pain down her left IT band at times and increased muscle fatigue due to household chores yesterday. Pt performed mat exercises without difficulty. skilled PT needed to progress pt toward her goals set.    Rehab Potential Good   Clinical Impairments Affecting Rehab Potential on blood thinner so bruises easily   PT Frequency 2x / week   PT Duration 8 weeks   PT Treatment/Interventions ADLs/Self Care Home Management;Cryotherapy;Electrical Stimulation;Moist Heat;Iontophoresis 4mg /ml Dexamethasone;Therapeutic exercise;Therapeutic activities;Functional mobility training;Stair training;Gait training;Patient/family education;Manual techniques;Energy conservation;Splinting;Passive range of motion;Neuromuscular re-education;Ultrasound;Dry needling   PT Next Visit Plan Dry needling to Lt ITB and gluteals, flexibility and strength, manual therapy   Consulted and Agree with Plan of Care Patient      Patient will benefit from skilled therapeutic intervention in order to improve the following deficits and impairments:     Visit Diagnosis: Pain in left hip  Stiffness of left hip, not elsewhere classified  Muscle weakness (generalized)  Chronic left-sided low back pain with left-sided sciatica  Cramp and spasm     Problem List There are no active problems to display for this patient.   Oretha Caprice, MPT  04/08/2016, 11:48 AM  [facility] [address] [place], [place], [address] Phone: [phone]   Fax:  [phone]  Name: [name] MRN: [id number] Date of Birth: December 18, 1940

## 2016-04-09 ENCOUNTER — Ambulatory Visit: Payer: Medicare Other

## 2016-04-09 DIAGNOSIS — M25652 Stiffness of left hip, not elsewhere classified: Secondary | ICD-10-CM

## 2016-04-09 DIAGNOSIS — G8929 Other chronic pain: Secondary | ICD-10-CM | POA: Diagnosis not present

## 2016-04-09 DIAGNOSIS — M25552 Pain in left hip: Secondary | ICD-10-CM | POA: Diagnosis not present

## 2016-04-09 DIAGNOSIS — M6281 Muscle weakness (generalized): Secondary | ICD-10-CM | POA: Diagnosis not present

## 2016-04-09 DIAGNOSIS — R252 Cramp and spasm: Secondary | ICD-10-CM | POA: Diagnosis not present

## 2016-04-09 DIAGNOSIS — M5442 Lumbago with sciatica, left side: Secondary | ICD-10-CM | POA: Diagnosis not present

## 2016-04-09 NOTE — Patient Instructions (Addendum)

## 2016-04-09 NOTE — Therapy (Signed)
[facility] [address], [place], [place], [address] Phone: [phone]   Fax:  [phone]  Physical Therapy Treatment  Patient Details  Name: [name] MRN: [id number] Date of Birth: December 11, 1940 Referring Provider: Benjaman Lobe PA-C  Encounter Date: 04/09/2016      PT End of Session - 04/09/16 1045    Visit Number 3   Number of Visits 10   Date for PT Re-Evaluation 05/27/16   Authorization Type Pt has had 15 visits this year, KX on all visits.   PT Start Time 1015  dry needling   PT Stop Time 1100   PT Time Calculation (min) 45 min   Activity Tolerance Patient tolerated treatment well   Behavior During Therapy WFL for tasks assessed/performed      Past Medical History:  Diagnosis Date  . Arthritis    right shoulder replacement, right replacement   . Atrial fibrillation [facility])     Past Surgical History:  Procedure Laterality Date  . JOINT REPLACEMENT     right shoulder 2008; right hip 2016    There were no vitals filed for this visit.      Subjective Assessment - 04/09/16 1014    Subjective No pain today.     Patient is accompained by: Family member   Currently in Pain? No/denies                         [facility] Adult PT Treatment/Exercise - 04/09/16 0001      Lumbar Exercises: Stretches   Active Hamstring Stretch 3 reps;30 seconds   Single Knee to Chest Stretch 3 reps;20 seconds     Modalities   Modalities Moist Heat     Moist Heat Therapy   Number Minutes Moist Heat 10 Minutes   Moist Heat Location Hip  Lt and IT Band     Manual Therapy   Manual Therapy Soft tissue mobilization   Manual therapy comments soft tissue elongation and trigger point release to Rt gluteals and ITB          Trigger Point Dry Needling - 04/09/16 1019    Consent Given? Yes   Education Handout Provided Yes   Muscles Treated Lower Body Piriformis;Gluteus minimus;Tensor fascia  lata   Gluteus Minimus Response Twitch response elicited;Palpable increased muscle length   Piriformis Response Twitch response elicited;Palpable increased muscle length   Tensor Fascia Lata Response Twitch response elicited;Palpable increased muscle length              PT Education - 04/09/16 1041    Education provided Yes   Education Details DN aftercare   Person(s) Educated Patient;Spouse   Methods Explanation;Demonstration;Handout   Comprehension Verbalized understanding;Returned demonstration          PT Short Term Goals - 04/08/16 1126      PT SHORT TERM GOAL #1   Title be independent in initial HEP   Time 4   Period Weeks   Status On-going     PT SHORT TERM GOAL #2   Title verbalize and demonstrate understanding of body mechanics and correct posture to reduce risk of fracture associated with osteopenia   Time 4   Period Weeks   Status New     PT SHORT TERM GOAL #3   Title report a 30% reduction in LBP and Lt LE pain with ADLs and self-care   Time 4   Period Weeks     PT  SHORT TERM GOAL #4   Title Patient will report 2/10 pain at worst in her bilateral hips   Time 4   Period Weeks   Status On-going           PT Long Term Goals - 04/01/16 1018      PT LONG TERM GOAL #1   Title be independent in advanced HEP   Time 8   Period Weeks   Status New     PT LONG TERM GOAL #2   Title reduce FOTO to < or = to 37% limitation   Time 8   Period Weeks   Status New     PT LONG TERM GOAL #3   Title report a 60% reduction in LBP and Lt LE pain with standing and walking   Time 8   Period Weeks   Status New     PT LONG TERM GOAL #4   Title return to regular gym exercise routine without limitation   Time 8   Period Days   Status New               Plan - 04/09/16 1045    Clinical Impression Statement Pt with tension and trigger points in distal>proximal IT Band and Lt gluteals.  Pt with soreness with soft tissue work after dry needling today.   Pt will focus on flexibility over the next few days.  Pt will continue to benefit from skilled PT for flexibility, dry needling/manual and strength progression to reduce pain and improve endurance.   Rehab Potential Good   Clinical Impairments Affecting Rehab Potential on blood thinner so bruises easily   PT Frequency 2x / week   PT Duration 8 weeks   PT Treatment/Interventions ADLs/Self Care Home Management;Cryotherapy;Electrical Stimulation;Moist Heat;Iontophoresis 4mg /ml Dexamethasone;Therapeutic exercise;Therapeutic activities;Functional mobility training;Stair training;Gait training;Patient/family education;Manual techniques;Energy conservation;Splinting;Passive range of motion;Neuromuscular re-education;Ultrasound;Dry needling   PT Next Visit Plan Dry needling to Lt ITB and gluteals again next week if tolerate, flexibility and strength, manual therapy   Consulted and Agree with Plan of Care Patient      Patient will benefit from skilled therapeutic intervention in order to improve the following deficits and impairments:  Decreased strength, Decreased mobility, Postural dysfunction, Improper body mechanics, Impaired flexibility, Hypomobility, Decreased activity tolerance, Decreased endurance, Difficulty walking, Decreased range of motion  Visit Diagnosis: Pain in left hip  Stiffness of left hip, not elsewhere classified     Problem List There are no active problems to display for this patient.   Sigurd Sos, PT 04/09/16 10:53 AM  [facility] [address] [place], [place], [address] Phone: [phone]   Fax:  [phone]  Name: [name] MRN: [id number] Date of Birth: 1941-04-26

## 2016-04-14 ENCOUNTER — Ambulatory Visit: Payer: Medicare Other

## 2016-04-14 DIAGNOSIS — M25552 Pain in left hip: Secondary | ICD-10-CM | POA: Diagnosis not present

## 2016-04-14 DIAGNOSIS — M25652 Stiffness of left hip, not elsewhere classified: Secondary | ICD-10-CM | POA: Diagnosis not present

## 2016-04-14 DIAGNOSIS — M6281 Muscle weakness (generalized): Secondary | ICD-10-CM

## 2016-04-14 DIAGNOSIS — R252 Cramp and spasm: Secondary | ICD-10-CM | POA: Diagnosis not present

## 2016-04-14 DIAGNOSIS — M5442 Lumbago with sciatica, left side: Secondary | ICD-10-CM | POA: Diagnosis not present

## 2016-04-14 DIAGNOSIS — G8929 Other chronic pain: Secondary | ICD-10-CM | POA: Diagnosis not present

## 2016-04-14 NOTE — Therapy (Signed)
[facility] [address] [place], [place], [address] Phone: [phone]   Fax:  [phone]  Physical Therapy Treatment  Patient Details  Name: [name] MRN: [id number] Date of Birth: Nov 18, 1940 Referring Provider: Benjaman Lobe PA-C  Encounter Date: 04/14/2016      PT End of Session - 04/14/16 1227    Visit Number 4   Number of Visits 10   Date for PT Re-Evaluation 05/27/16   Authorization Type Pt has had 15 visits this year, KX on all visits.   Authorization Time Period Kx modifier in 15 visits.    PT Start Time 1145   PT Stop Time 1238   PT Time Calculation (min) 53 min   Activity Tolerance Patient tolerated treatment well   Behavior During Therapy WFL for tasks assessed/performed      Past Medical History:  Diagnosis Date  . Arthritis    right shoulder replacement, right replacement   . Atrial fibrillation [facility])     Past Surgical History:  Procedure Laterality Date  . JOINT REPLACEMENT     right shoulder 2008; right hip 2016    There were no vitals filed for this visit.      Subjective Assessment - 04/14/16 1146    Subjective Pt noticed less LE soreness after dry needling today.     Pertinent History right hip replacement 2016; right shoulder replacement 2008.  Pt had injection into Lt groin- very minmal relief   Patient Stated Goals reduce pain in Lt LE, return to the gym to regularly exercise   Currently in Pain? Yes   Pain Score 1    Pain Location Leg   Pain Orientation Left   Pain Descriptors / Indicators Aching;Sore   Pain Type Chronic pain   Pain Onset More than a month ago   Pain Frequency Intermittent   Aggravating Factors  only when rubbing it now   Pain Relieving Factors not rubbing it                         [facility] Adult PT Treatment/Exercise - 04/14/16 0001      Lumbar Exercises: Stretches   Active Hamstring Stretch 3 reps;30 seconds   Single  Knee to Chest Stretch 3 reps;20 seconds     Knee/Hip Exercises: Aerobic   Stationary Bike Level 1x 8 minutes     Knee/Hip Exercises: Standing   Forward Step Up --   Rebounder weight shifting 3 ways x 1 minute      Modalities   Modalities Moist Heat     Moist Heat Therapy   Number Minutes Moist Heat 10 Minutes   Moist Heat Location Hip     Manual Therapy   Manual Therapy Soft tissue mobilization   Manual therapy comments soft tissue elongation and trigger point release to Rt gluteals and ITB          Trigger Point Dry Needling - 04/14/16 1151    Consent Given? Yes   Muscles Treated Lower Body Gluteus minimus;Piriformis;Tensor fascia lata  Lt   Gluteus Minimus Response Twitch response elicited;Palpable increased muscle length   Piriformis Response Twitch response elicited;Palpable increased muscle length   Tensor Fascia Lata Response Twitch response elicited;Palpable increased muscle length                PT Short Term Goals - 04/14/16 1147      PT SHORT TERM GOAL #1   Title be  independent in initial HEP   Status Achieved     PT SHORT TERM GOAL #2   Title verbalize and demonstrate understanding of body mechanics and correct posture to reduce risk of fracture associated with osteopenia   Time 4   Period Weeks   Status On-going     PT SHORT TERM GOAL #3   Title report a 30% reduction in LBP and Lt LE pain with ADLs and self-care   Status Achieved     PT SHORT TERM GOAL #4   Title Patient will report 2/10 pain at worst in her bilateral hips   Status Achieved           PT Long Term Goals - 04/01/16 1018      PT LONG TERM GOAL #1   Title be independent in advanced HEP   Time 8   Period Weeks   Status New     PT LONG TERM GOAL #2   Title reduce FOTO to < or = to 37% limitation   Time 8   Period Weeks   Status New     PT LONG TERM GOAL #3   Title report a 60% reduction in LBP and Lt LE pain with standing and walking   Time 8   Period Weeks    Status New     PT LONG TERM GOAL #4   Title return to regular gym exercise routine without limitation   Time 8   Period Days   Status New               Plan - 04/14/16 1148    Clinical Impression Statement Pt reports that she feels 25% better since the start of care.  Pt tolerated exercise today without increased pain and PT encouraged her to go the the gym again.  Pt with tension and trigger points in distal>proximal IT Band and Lt gluteals.  Pt with tenderness with soft tissue work after dry needling today.  Pt will continue to benefit from skilled PT for LT LE flexibility, strength, manual and modalities.     Rehab Potential Good   Clinical Impairments Affecting Rehab Potential on blood thinner so bruises easily   PT Frequency 2x / week   PT Duration 8 weeks   PT Treatment/Interventions ADLs/Self Care Home Management;Cryotherapy;Electrical Stimulation;Moist Heat;Iontophoresis 4mg /ml Dexamethasone;Therapeutic exercise;Therapeutic activities;Functional mobility training;Stair training;Gait training;Patient/family education;Manual techniques;Energy conservation;Splinting;Passive range of motion;Neuromuscular re-education;Ultrasound;Dry needling   PT Next Visit Plan assess response to dry needling to Lt ITB and gluteals again next week if tolerated, flexibility and strength, manual therapy.  Osteoporosis education   Consulted and Agree with Plan of Care Patient      Patient will benefit from skilled therapeutic intervention in order to improve the following deficits and impairments:  Decreased strength, Decreased mobility, Postural dysfunction, Improper body mechanics, Impaired flexibility, Hypomobility, Decreased activity tolerance, Decreased endurance, Difficulty walking, Decreased range of motion  Visit Diagnosis: Pain in left hip  Stiffness of left hip, not elsewhere classified  Muscle weakness (generalized)     Problem List There are no active problems to display for  this patient.    [name], PT 04/14/16 12:29 PM  [facility] [address] [place], [place], [address] Phone: [phone]   Fax:  [phone]  Name: [name] MRN: [id number] Date of Birth: Feb 23, 1941

## 2016-04-17 ENCOUNTER — Ambulatory Visit: Payer: Medicare Other | Admitting: Physical Therapy

## 2016-04-17 DIAGNOSIS — M25652 Stiffness of left hip, not elsewhere classified: Secondary | ICD-10-CM

## 2016-04-17 DIAGNOSIS — M6281 Muscle weakness (generalized): Secondary | ICD-10-CM | POA: Diagnosis not present

## 2016-04-17 DIAGNOSIS — G8929 Other chronic pain: Secondary | ICD-10-CM | POA: Diagnosis not present

## 2016-04-17 DIAGNOSIS — M5442 Lumbago with sciatica, left side: Secondary | ICD-10-CM | POA: Diagnosis not present

## 2016-04-17 DIAGNOSIS — R252 Cramp and spasm: Secondary | ICD-10-CM | POA: Diagnosis not present

## 2016-04-17 DIAGNOSIS — M25552 Pain in left hip: Secondary | ICD-10-CM

## 2016-04-17 NOTE — Patient Instructions (Signed)
Stacy Simpson PT [facility] [address], [address] [place], [place] [address] Phone # [phone] Fax [phone]    

## 2016-04-17 NOTE — Therapy (Signed)
[facility] [address] [place], [place], [address] Phone: [phone]   Fax:  [phone]  Physical Therapy Treatment  Patient Details  Name: [name] MRN: [id number] Date of Birth: 12-21-1940 Referring Provider: Benjaman Lobe PA-C  Encounter Date: 04/17/2016      PT End of Session - 04/17/16 1248    Visit Number 5   Number of Visits 10   Date for PT Re-Evaluation 05/27/16   Authorization Type Pt has had 15 visits this year, KX on all visits.   PT Start Time [phone]   PT Stop Time 1025   PT Time Calculation (min) 52 min   Activity Tolerance Patient tolerated treatment well      Past Medical History:  Diagnosis Date  . Arthritis    right shoulder replacement, right replacement   . Atrial fibrillation [facility])     Past Surgical History:  Procedure Laterality Date  . JOINT REPLACEMENT     right shoulder 2008; right hip 2016    There were no vitals filed for this visit.      Subjective Assessment - 04/17/16 0936    Subjective Reports lateral hip pain with sidelying.  States after having 2 sessions of dry needling, she has improved mobility.    Denies excessive soreness following needling.     Currently in Pain? Yes   Pain Score 1    Pain Location Hip   Pain Orientation Left   Pain Type Chronic pain                         [facility] Adult PT Treatment/Exercise - 04/17/16 0001      Knee/Hip Exercises: Aerobic   Nustep L1 10 min seat 6     Knee/Hip Exercises: Standing   Other Standing Knee Exercises psoas door way stretch 3x 5   Other Standing Knee Exercises wall ITB stretch 2x 30 sec     Moist Heat Therapy   Number Minutes Moist Heat 10 Minutes   Moist Heat Location Hip     Manual Therapy   Manual Therapy Joint mobilization   Manual therapy comments soft tissue elongation and trigger point release to Rt gluteals and ITB   Joint Mobilization left hip long axis distraction  grade 3, inferior grade 3 3x 30 sec          Trigger Point Dry Needling - 04/17/16 0944    Consent Given? Yes   Gluteus Minimus Response Twitch response elicited;Palpable increased muscle length   Piriformis Response Twitch response elicited;Palpable increased muscle length   Tensor Fascia Lata Response Twitch response elicited;Palpable increased muscle length              PT Education - 04/17/16 1247    Education provided Yes   Education Details psoas doorway stretch   Person(s) Educated Patient   Methods Explanation;Demonstration;Handout   Comprehension Verbalized understanding;Returned demonstration          PT Short Term Goals - 04/17/16 1251      PT SHORT TERM GOAL #1   Title be independent in initial HEP   Status Achieved     PT SHORT TERM GOAL #2   Title verbalize and demonstrate understanding of body mechanics and correct posture to reduce risk of fracture associated with osteopenia   Time 4   Period Weeks   Status On-going     PT SHORT TERM GOAL #3   Title report a  30% reduction in LBP and Lt LE pain with ADLs and self-care   Status Achieved     PT SHORT TERM GOAL #4   Title Patient will report 2/10 pain at worst in her bilateral hips   Status Achieved           PT Long Term Goals - 04/17/16 1251      PT LONG TERM GOAL #1   Title be independent in advanced HEP   Time 8   Period Weeks   Status On-going     PT LONG TERM GOAL #2   Title reduce FOTO to < or = to 37% limitation   Time 8   Period Weeks   Status On-going     PT LONG TERM GOAL #3   Title report a 60% reduction in LBP and Lt LE pain with standing and walking   Time 8   Period Weeks   Status On-going     PT LONG TERM GOAL #4   Title return to regular gym exercise routine without limitation   Time 8   Period Days   Status On-going               Plan - 04/17/16 1248    Clinical Impression Statement The patient has tender points in gluteals, pirifomis and  TLF/lateral quads.  Improved muscle length following dry needling and manual therapy.  Modified psoas doorway and ITB stretch secondary to shoulder pain.  Therapist closely monitoring response with all treatment interventions.     PT Next Visit Plan assess response to dry needling to Lt ITB and gluteals #3;  flexibility and strength, manual therapy.  Osteoporosis education      Patient will benefit from skilled therapeutic intervention in order to improve the following deficits and impairments:     Visit Diagnosis: Pain in left hip  Stiffness of left hip, not elsewhere classified  Muscle weakness (generalized)     Problem List There are no active problems to display for this patient.  Ruben Im, PT 04/17/16 12:52 PM Phone: [phone] Fax: [phone]  [name] 04/17/2016, 12:52 PM  [facility] [address], [place], [place], [address] Phone: [phone]   Fax:  [phone]  Name: CILICIA CARNAL MRN: [id number] Date of Birth: 1941/05/29

## 2016-04-20 ENCOUNTER — Ambulatory Visit: Payer: Medicare Other

## 2016-04-20 DIAGNOSIS — M5442 Lumbago with sciatica, left side: Secondary | ICD-10-CM | POA: Diagnosis not present

## 2016-04-20 DIAGNOSIS — M25552 Pain in left hip: Secondary | ICD-10-CM | POA: Diagnosis not present

## 2016-04-20 DIAGNOSIS — M25652 Stiffness of left hip, not elsewhere classified: Secondary | ICD-10-CM | POA: Diagnosis not present

## 2016-04-20 DIAGNOSIS — R252 Cramp and spasm: Secondary | ICD-10-CM | POA: Diagnosis not present

## 2016-04-20 DIAGNOSIS — M6281 Muscle weakness (generalized): Secondary | ICD-10-CM | POA: Diagnosis not present

## 2016-04-20 DIAGNOSIS — G8929 Other chronic pain: Secondary | ICD-10-CM | POA: Diagnosis not present

## 2016-04-20 NOTE — Patient Instructions (Addendum)
DO's and DON'T's   Avoid and/or Minimize positions of forward bending ( flexion)  Side bending and rotation of the trunk  Especially when movements occur together   When your back aches:   Don't sit down   Lie down on your back with a small pillow under your head and one under your knees or as outlined by our therapist. Or, lie in the 90/90 position ( on the floor with your feet and legs on the sofa with knees and hips bent to 90 degrees)  Tying or putting on your shoes:   Don't bend over to tie your shoes or put on socks.  Instead, bring one foot up, cross it over the opposite knee and bend forward (hinge) at the hips to so the task.  Keep your back straight.  If you cannot do this safely, then you need to use long handled assistive devices such as a shoehorn and sock puller.  Exercising:  Don't engage in ballistic types of exercise routines such as high-impact aerobics or jumping rope  Don't do exercises in the gym that bring you forward (abdominal crunches, sit-ups, touching your  toes, knee-to-chest, straight leg raising.)  Follow a regular exercise program that includes a variety of different weight-bearing activities, such as low-impact aerobics, T' ai chi or walking as your physical therapist advises  Do exercises that emphasize return to normal body alignment and strengthening of the muscles that keep your back straight, as outlined in this program or by your therapist  Household tasks:  Don't reach unnecessarily or twist your trunk when mopping, sweeping, vacuuming, raking, making beds, weeding gardens, getting objects ou of cupboards, etc.  Keep your broom, mop, vacuum, or rake close to you and mover your whole body as you move them. Walk over to the area on which you are working. Arrange kitchen, bathroom, and bedroom shelves so that frequently used items may be reached without excessive bending, twisting, and reaching.  Use a  sturdy stool if necessary.  Don't bend from the waist to pick up something up  Off the floor, out of the trunk of your car, or to brush your teeth, wash your face, etc.   Bend at the knees, keeping back straight as possible. Use a reacher if necessary.   Prevention of fracture is the so-called "BOTTOm -Line" in the management of OSTEOPOROSIS. Do not take unnecessary chances in movement. Once a compression fracture occurs, the process is very difficult to control; one fracture is frequently followed by many more.  [name], PT 04/20/16 11:56 AM [facility] [address] [employer] [place], [place] [address] Phone # [phone] Fax [phone]

## 2016-04-20 NOTE — Therapy (Signed)
[facility] [address] [place], [place], [address] Phone: [phone]   Fax:  [phone]  Physical Therapy Treatment  Patient Details  Name: [name] MRN: [id number] Date of Birth: 07/02/1940 Referring Provider: Benjaman Lobe PA-C  Encounter Date: 04/20/2016      PT End of Session - 04/20/16 1226    Visit Number 6   Number of Visits 10   Date for PT Re-Evaluation 05/27/16   Authorization Type Pt has had 15 visits this year, KX on all visits.   PT Start Time 1145   PT Stop Time 1224   PT Time Calculation (min) 39 min   Activity Tolerance Patient tolerated treatment well   Behavior During Therapy WFL for tasks assessed/performed      Past Medical History:  Diagnosis Date  . Arthritis    right shoulder replacement, right replacement   . Atrial fibrillation [facility])     Past Surgical History:  Procedure Laterality Date  . JOINT REPLACEMENT     right shoulder 2008; right hip 2016    There were no vitals filed for this visit.      Subjective Assessment - 04/20/16 1145    Subjective Pain in Lt leg comes and goes.     Pertinent History right hip replacement 2016; right shoulder replacement 2008.  Pt had injection into Lt groin- very minmal relief   Patient Stated Goals reduce pain in Lt LE, return to the gym to regularly exercise   Currently in Pain? Yes   Pain Score 2    Pain Location Hip   Pain Orientation Left   Pain Descriptors / Indicators Aching;Sore   Pain Type Chronic pain   Pain Onset More than a month ago   Pain Frequency Intermittent   Aggravating Factors  distance walking, inactivity   Pain Relieving Factors moving around                         [facility] Adult PT Treatment/Exercise - 04/20/16 0001      Lumbar Exercises: Stretches   Active Hamstring Stretch 3 reps;30 seconds   Single Knee to Chest Stretch 3 reps;20 seconds     Lumbar Exercises: Supine   Bridge 20 reps;5 seconds     Knee/Hip Exercises: Aerobic   Stationary Bike Level 1x 8 minutes     Knee/Hip Exercises: Supine   Hip Adduction Isometric Strengthening;Both;2 sets;10 reps     Knee/Hip Exercises: Sidelying   Clams 20 reps                PT Education - 04/20/16 1157    Education provided Yes   Education Details osteoporosis education   Person(s) Educated Patient   Methods Explanation;Demonstration;Handout   Comprehension Verbalized understanding;Returned demonstration          PT Short Term Goals - 04/20/16 1147      PT SHORT TERM GOAL #1   Title be independent in initial HEP   Status Achieved     PT SHORT TERM GOAL #2   Title verbalize and demonstrate understanding of body mechanics and correct posture to reduce risk of fracture associated with osteopenia   Status Achieved     PT SHORT TERM GOAL #3   Title report a 30% reduction in LBP and Lt LE pain with ADLs and self-care   Status Achieved     PT SHORT TERM GOAL #4   Title Patient will report 2/10  pain at worst in her bilateral hips   Status Achieved           PT Long Term Goals - 04/20/16 1148      PT LONG TERM GOAL #1   Title be independent in advanced HEP   Time 8   Period Weeks   Status On-going     PT LONG TERM GOAL #2   Title reduce FOTO to < or = to 37% limitation   Time 8   Period Weeks   Status On-going     PT LONG TERM GOAL #3   Title report a 60% reduction in LBP and Lt LE pain with standing and walking   Time 8   Period Weeks   Status On-going     PT LONG TERM GOAL #4   Title return to regular gym exercise routine without limitation   Time 8   Period Days   Status On-going               Plan - 04/20/16 1150    Clinical Impression Statement Pt reports 40% overall improvement in symptoms since the start of care.  Pt reports that she is going to the gym now and doing the bike for 20 minutes.  Pt has received education regarding osteopenia and  precautions associated with it.  Pt wil continue to benefit from skilled PT for flexibility, strength, manual and modalities.     Rehab Potential Good   PT Frequency 2x / week   PT Duration 8 weeks   PT Treatment/Interventions ADLs/Self Care Home Management;Cryotherapy;Electrical Stimulation;Moist Heat;Iontophoresis 4mg /ml Dexamethasone;Therapeutic exercise;Therapeutic activities;Functional mobility training;Stair training;Gait training;Patient/family education;Manual techniques;Energy conservation;Splinting;Passive range of motion;Neuromuscular re-education;Ultrasound;Dry needling   PT Next Visit Plan Dry needling next session.  flexibility and strength, manual therapy.     Consulted and Agree with Plan of Care Patient;Family member/caregiver   Family Member Consulted Pt's husband      Patient will benefit from skilled therapeutic intervention in order to improve the following deficits and impairments:  Decreased strength, Decreased mobility, Postural dysfunction, Improper body mechanics, Impaired flexibility, Hypomobility, Decreased activity tolerance, Decreased endurance, Difficulty walking, Decreased range of motion  Visit Diagnosis: Pain in left hip  Stiffness of left hip, not elsewhere classified  Muscle weakness (generalized)     Problem List There are no active problems to display for this patient.    Sigurd Sos, PT 04/20/16 12:29 PM  [facility] [address], [place], [place], [address] Phone: [phone]   Fax:  [phone]  Name: [name] MRN: [id number] Date of Birth: [birth year]

## 2016-04-22 ENCOUNTER — Ambulatory Visit: Payer: Medicare Other

## 2016-04-22 DIAGNOSIS — M25552 Pain in left hip: Secondary | ICD-10-CM | POA: Diagnosis not present

## 2016-04-22 DIAGNOSIS — G8929 Other chronic pain: Secondary | ICD-10-CM | POA: Diagnosis not present

## 2016-04-22 DIAGNOSIS — M6281 Muscle weakness (generalized): Secondary | ICD-10-CM

## 2016-04-22 DIAGNOSIS — R252 Cramp and spasm: Secondary | ICD-10-CM | POA: Diagnosis not present

## 2016-04-22 DIAGNOSIS — M5442 Lumbago with sciatica, left side: Secondary | ICD-10-CM | POA: Diagnosis not present

## 2016-04-22 DIAGNOSIS — M25652 Stiffness of left hip, not elsewhere classified: Secondary | ICD-10-CM

## 2016-04-22 DIAGNOSIS — S50819A Abrasion of unspecified forearm, initial encounter: Secondary | ICD-10-CM | POA: Diagnosis not present

## 2016-04-22 NOTE — Therapy (Signed)
[facility] [address], [place], [place], [address] Phone: [phone]   Fax:  [phone]  Physical Therapy Treatment  Patient Details  Name: [name] MRN: [id number] Date of Birth: May 29, 1941 Referring Provider: Benjaman Lobe PA-C  Encounter Date: 04/22/2016      PT End of Session - 04/22/16 1139    Visit Number 7   Number of Visits 10   Date for PT Re-Evaluation 05/27/16   Authorization Type Pt has had 15 visits this year, KX on all visits.   PT Start Time 1059   PT Stop Time 1150   PT Time Calculation (min) 51 min   Activity Tolerance Patient tolerated treatment well   Behavior During Therapy WFL for tasks assessed/performed      Past Medical History:  Diagnosis Date  . Arthritis    right shoulder replacement, right replacement   . Atrial fibrillation [facility])     Past Surgical History:  Procedure Laterality Date  . JOINT REPLACEMENT     right shoulder 2008; right hip 2016    There were no vitals filed for this visit.      Subjective Assessment - 04/22/16 1104    Subjective I am feeling good today.  Tension and tenderness in Lt gluteals and ITB   Pertinent History right hip replacement 2016; right shoulder replacement 2008.  Pt had injection into Lt groin- very minmal relief   Currently in Pain? No/denies                         [facility] Adult PT Treatment/Exercise - 04/22/16 0001      Lumbar Exercises: Stretches   Active Hamstring Stretch 3 reps;30 seconds   Single Knee to Chest Stretch 3 reps;20 seconds     Lumbar Exercises: Supine   Bridge 20 reps;5 seconds     Knee/Hip Exercises: Aerobic   Nustep L2 8 min seat 6     Knee/Hip Exercises: Supine   Hip Adduction Isometric Strengthening;Both;2 sets;10 reps     Knee/Hip Exercises: Sidelying   Clams 20 reps     Moist Heat Therapy   Number Minutes Moist Heat 10 Minutes   Moist Heat Location Hip     Manual  Therapy   Manual therapy comments soft tissue elongation and trigger point release to Lt gluteals and ITB          Trigger Point Dry Needling - 04/22/16 1106    Consent Given? Yes  Lt side   Muscles Treated Lower Body Gluteus minimus;Piriformis;Tensor fascia lata   Gluteus Minimus Response Twitch response elicited;Palpable increased muscle length   Piriformis Response Twitch response elicited;Palpable increased muscle length   Tensor Fascia Lata Response Twitch response elicited;Palpable increased muscle length                PT Short Term Goals - 04/20/16 1147      PT SHORT TERM GOAL #1   Title be independent in initial HEP   Status Achieved     PT SHORT TERM GOAL #2   Title verbalize and demonstrate understanding of body mechanics and correct posture to reduce risk of fracture associated with osteopenia   Status Achieved     PT SHORT TERM GOAL #3   Title report a 30% reduction in LBP and Lt LE pain with ADLs and self-care   Status Achieved     PT SHORT TERM GOAL #4   Title Patient will  report 2/10 pain at worst in her bilateral hips   Status Achieved           PT Long Term Goals - 04/20/16 1148      PT LONG TERM GOAL #1   Title be independent in advanced HEP   Time 8   Period Weeks   Status On-going     PT LONG TERM GOAL #2   Title reduce FOTO to < or = to 37% limitation   Time 8   Period Weeks   Status On-going     PT LONG TERM GOAL #3   Title report a 60% reduction in LBP and Lt LE pain with standing and walking   Time 8   Period Weeks   Status On-going     PT LONG TERM GOAL #4   Title return to regular gym exercise routine without limitation   Time 8   Period Days   Status On-going               Plan - 04/22/16 1129    Clinical Impression Statement Pt reports 40% improvment in symptoms in the Lt LE since the start of care.  Pt is going to the gym regularly and is doing the bike.  Pt has been making body mechanics modifications  at home and is having her husband assist with housework that requires twisting.  Pt with significant reduction in tenderness over Lt ITB and gluteals with soft tissue work today.  Pt will continue to benefit from skilled PT for strength, flexibility, manual and modalities.     Rehab Potential Good   PT Frequency 2x / week   PT Duration 8 weeks   PT Treatment/Interventions ADLs/Self Care Home Management;Cryotherapy;Electrical Stimulation;Moist Heat;Iontophoresis 4mg /ml Dexamethasone;Therapeutic exercise;Therapeutic activities;Functional mobility training;Stair training;Gait training;Patient/family education;Manual techniques;Energy conservation;Splinting;Passive range of motion;Neuromuscular re-education;Ultrasound;Dry needling   PT Next Visit Plan Dry needling as needed.  flexibility and strength, manual therapy.     Consulted and Agree with Plan of Care Patient      Patient will benefit from skilled therapeutic intervention in order to improve the following deficits and impairments:  Decreased strength, Decreased mobility, Postural dysfunction, Improper body mechanics, Impaired flexibility, Hypomobility, Decreased activity tolerance, Decreased endurance, Difficulty walking, Decreased range of motion  Visit Diagnosis: Pain in left hip  Stiffness of left hip, not elsewhere classified  Muscle weakness (generalized)  Cramp and spasm     Problem List There are no active problems to display for this patient.    Sigurd Sos, PT 04/22/16 11:41 AM  [facility] [address], [place], [place], [address] Phone: [phone]   Fax:  [phone]  Name: [name] MRN: [id number] Date of Birth: Jul 09, 1940

## 2016-04-27 ENCOUNTER — Ambulatory Visit: Payer: Medicare Other | Admitting: Physical Therapy

## 2016-04-27 ENCOUNTER — Encounter: Payer: Self-pay | Admitting: Physical Therapy

## 2016-04-27 DIAGNOSIS — M5442 Lumbago with sciatica, left side: Secondary | ICD-10-CM | POA: Diagnosis not present

## 2016-04-27 DIAGNOSIS — G8929 Other chronic pain: Secondary | ICD-10-CM | POA: Diagnosis not present

## 2016-04-27 DIAGNOSIS — M6281 Muscle weakness (generalized): Secondary | ICD-10-CM | POA: Diagnosis not present

## 2016-04-27 DIAGNOSIS — M25552 Pain in left hip: Secondary | ICD-10-CM | POA: Diagnosis not present

## 2016-04-27 DIAGNOSIS — R252 Cramp and spasm: Secondary | ICD-10-CM | POA: Diagnosis not present

## 2016-04-27 DIAGNOSIS — M25652 Stiffness of left hip, not elsewhere classified: Secondary | ICD-10-CM

## 2016-04-27 NOTE — Therapy (Signed)
[facility] [address] [place], [place], [address] Phone: [phone]   Fax:  [phone]  Physical Therapy Treatment  Patient Details  Name: [name] MRN: [id number] Date of Birth: [birth year] Referring Provider: Benjaman Lobe PA-C  Encounter Date: 04/27/2016      PT End of Session - 04/27/16 1223    Visit Number 8   Number of Visits 10   Date for PT Re-Evaluation 05/27/16   Authorization Type Pt has had 15 visits this year, KX on all visits.   Authorization Time Period Kx modifier in 15 visits.    PT Start Time 1146   PT Stop Time 1225   PT Time Calculation (min) 39 min   Activity Tolerance Patient tolerated treatment well   Behavior During Therapy WFL for tasks assessed/performed      Past Medical History:  Diagnosis Date  . Arthritis    right shoulder replacement, right replacement   . Atrial fibrillation [facility])     Past Surgical History:  Procedure Laterality Date  . JOINT REPLACEMENT     right shoulder 2008; right hip 2016    There were no vitals filed for this visit.      Subjective Assessment - 04/27/16 1153    Subjective Continue to feel good, I put the cane up!   Currently in Pain? Yes   Pain Score 2    Pain Location Shoulder   Pain Orientation Right;Left   Pain Descriptors / Indicators Dull;Aching   Aggravating Factors  From weather this AM. Cold   Pain Relieving Factors Warmer weather   Multiple Pain Sites No                         [facility] Adult PT Treatment/Exercise - 04/27/16 0001      Lumbar Exercises: Stretches   Active Hamstring Stretch 3 reps;30 seconds   Single Knee to Chest Stretch 3 reps;20 seconds     Lumbar Exercises: Supine   Bridge 20 reps;5 seconds     Knee/Hip Exercises: Aerobic   Nustep L2 seat 7 8 min  towel roll for lumbar, concurrent review of staus      Knee/Hip Exercises: Supine   Hip Adduction Isometric  Strengthening;Both;2 sets;10 reps     Knee/Hip Exercises: Sidelying   Clams 20 reps                  PT Short Term Goals - 04/20/16 1147      PT SHORT TERM GOAL #1   Title be independent in initial HEP   Status Achieved     PT SHORT TERM GOAL #2   Title verbalize and demonstrate understanding of body mechanics and correct posture to reduce risk of fracture associated with osteopenia   Status Achieved     PT SHORT TERM GOAL #3   Title report a 30% reduction in LBP and Lt LE pain with ADLs and self-care   Status Achieved     PT SHORT TERM GOAL #4   Title Patient will report 2/10 pain at worst in her bilateral hips   Status Achieved           PT Long Term Goals - 04/27/16 1211      PT LONG TERM GOAL #1   Title be independent in advanced HEP   Time 8   Period Weeks   Status On-going     PT LONG TERM GOAL #2  Title reduce FOTO to < or = to 37% limitation   Time 8   Period Weeks     PT LONG TERM GOAL #3   Title report a 60% reduction in LBP and Lt LE pain with standing and walking   Time 8   Period Weeks   Status On-going  50%     PT LONG TERM GOAL #4   Title return to regular gym exercise routine without limitation   Time 8   Period Days   Status On-going               Plan - 04/27/16 1216    Clinical Impression Statement Pt had difficulty recruiting her gluteals with her bridge today. She caught cramps in her Lt hamstrings often which were relieved with a refocus on the glutes. Pt appears to still have weakness in her glutes. She does report her LBP and her leg symptoms are 50% reduced at this point.. making progress towards that long term goal.    Rehab Potential Good   Clinical Impairments Affecting Rehab Potential on blood thinner so bruises easily   PT Frequency 2x / week   PT Duration 8 weeks   PT Treatment/Interventions ADLs/Self Care Home Management;Cryotherapy;Electrical Stimulation;Moist Heat;Iontophoresis 4mg /ml  Dexamethasone;Therapeutic exercise;Therapeutic activities;Functional mobility training;Stair training;Gait training;Patient/family education;Manual techniques;Energy conservation;Splinting;Passive range of motion;Neuromuscular re-education;Ultrasound;Dry needling   PT Next Visit Plan Dry needling as needed.  flexibility and strength, manual therapy.     Consulted and Agree with Plan of Care Patient      Patient will benefit from skilled therapeutic intervention in order to improve the following deficits and impairments:  Decreased strength, Decreased mobility, Postural dysfunction, Improper body mechanics, Impaired flexibility, Hypomobility, Decreased activity tolerance, Decreased endurance, Difficulty walking, Decreased range of motion  Visit Diagnosis: Pain in left hip  Stiffness of left hip, not elsewhere classified  Muscle weakness (generalized)     Problem List There are no active problems to display for this patient.   Hettie Roselli, PTA 04/27/2016, 12:28 PM  [facility] [address], [place], [place], [address] Phone: [phone]   Fax:  [phone]  Name: [name] MRN: [id number] Date of Birth: [birth year]

## 2016-04-29 ENCOUNTER — Ambulatory Visit: Payer: Medicare Other | Attending: Family Medicine

## 2016-04-29 DIAGNOSIS — M25652 Stiffness of left hip, not elsewhere classified: Secondary | ICD-10-CM | POA: Diagnosis not present

## 2016-04-29 DIAGNOSIS — R252 Cramp and spasm: Secondary | ICD-10-CM | POA: Diagnosis not present

## 2016-04-29 DIAGNOSIS — M6281 Muscle weakness (generalized): Secondary | ICD-10-CM

## 2016-04-29 DIAGNOSIS — M25552 Pain in left hip: Secondary | ICD-10-CM

## 2016-04-29 NOTE — Therapy (Signed)
[facility] [address], [place], [place], [address] Phone: [phone]   Fax:  [phone]  Physical Therapy Treatment  Patient Details  Name: [name] MRN: [id number] Date of Birth: [birth year] Referring Provider: Benjaman Lobe PA-C  Encounter Date: 04/29/2016      PT End of Session - 04/29/16 1434    Visit Number 9   Number of Visits 10   Date for PT Re-Evaluation 05/27/16   Authorization Type Pt has had 15 visits this year, KX on all visits.   PT Start Time 1403   PT Stop Time 1443   PT Time Calculation (min) 40 min   Activity Tolerance Patient tolerated treatment well   Behavior During Therapy WFL for tasks assessed/performed      Past Medical History:  Diagnosis Date  . Arthritis    right shoulder replacement, right replacement   . Atrial fibrillation [facility])     Past Surgical History:  Procedure Laterality Date  . JOINT REPLACEMENT     right shoulder 2008; right hip 2016    There were no vitals filed for this visit.      Subjective Assessment - 04/29/16 1400    Subjective Not able to get to the gym as often.  Pt reports 60% overall improvement since the start of care.     Pertinent History right hip replacement 2016; right shoulder replacement 2008.  Pt had injection into Lt groin- very minmal relief   Patient Stated Goals reduce pain in Lt LE, return to the gym to regularly exercise   Currently in Pain? No/denies                         [facility] Adult PT Treatment/Exercise - 04/29/16 0001      Lumbar Exercises: Stretches   Active Hamstring Stretch 3 reps;30 seconds   Single Knee to Chest Stretch 3 reps;20 seconds     Knee/Hip Exercises: Aerobic   Nustep L2 seat 7 8 min  towel roll for lumbar, concurrent review of staus      Knee/Hip Exercises: Standing   Rebounder weight shifting 3 ways x 1 minute    Walking with Sports Cord 20# 2x10 holding handles     Knee/Hip Exercises: Seated   Sit to Sand 2 sets;10 reps     Knee/Hip Exercises: Sidelying   Clams 20 reps                  PT Short Term Goals - 04/20/16 1147      PT SHORT TERM GOAL #1   Title be independent in initial HEP   Status Achieved     PT SHORT TERM GOAL #2   Title verbalize and demonstrate understanding of body mechanics and correct posture to reduce risk of fracture associated with osteopenia   Status Achieved     PT SHORT TERM GOAL #3   Title report a 30% reduction in LBP and Lt LE pain with ADLs and self-care   Status Achieved     PT SHORT TERM GOAL #4   Title Patient will report 2/10 pain at worst in her bilateral hips   Status Achieved           PT Long Term Goals - 04/29/16 1405      PT LONG TERM GOAL #1   Title be independent in advanced HEP   Time 8   Period Weeks   Status On-going  PT LONG TERM GOAL #3   Title report a 60% reduction in LBP and Lt LE pain with standing and walking   Time 8   Period Weeks   Status On-going               Plan - 04/29/16 1406    Clinical Impression Statement Pt with inconsistent attendance at the gym due to scheduling conflicts.  Pt has also been inconsistent with exercises at home.  Pt with weakness in bil. hips and core.  Pt able to tolerate advancement of resistance exercises in the clinic.  Pt without pain so no dry needling today.  Pt will continue to benefit from skilled PT for strength and flexiblity and manual as needed.     Rehab Potential Good   PT Frequency 2x / week   PT Duration 8 weeks   PT Treatment/Interventions ADLs/Self Care Home Management;Cryotherapy;Electrical Stimulation;Moist Heat;Iontophoresis 4mg /ml Dexamethasone;Therapeutic exercise;Therapeutic activities;Functional mobility training;Stair training;Gait training;Patient/family education;Manual techniques;Energy conservation;Splinting;Passive range of motion;Neuromuscular re-education;Ultrasound;Dry needling   PT Next  Visit Plan Dry needling as needed.  flexibility and strength, manual therapy.     Consulted and Agree with Plan of Care Patient      Patient will benefit from skilled therapeutic intervention in order to improve the following deficits and impairments:  Decreased strength, Decreased mobility, Postural dysfunction, Improper body mechanics, Impaired flexibility, Hypomobility, Decreased activity tolerance, Decreased endurance, Difficulty walking, Decreased range of motion  Visit Diagnosis: Pain in left hip  Stiffness of left hip, not elsewhere classified  Muscle weakness (generalized)  Cramp and spasm     Problem List There are no active problems to display for this patient.    Sigurd Sos, PT 04/29/16 2:37 PM  [facility] [address] [place], [place], [address] Phone: [phone]   Fax:  [phone]  Name: [name] MRN: [id number] Date of Birth: [birth year]

## 2016-05-04 ENCOUNTER — Ambulatory Visit: Payer: Medicare Other

## 2016-05-04 DIAGNOSIS — M25652 Stiffness of left hip, not elsewhere classified: Secondary | ICD-10-CM

## 2016-05-04 DIAGNOSIS — R252 Cramp and spasm: Secondary | ICD-10-CM | POA: Diagnosis not present

## 2016-05-04 DIAGNOSIS — M6281 Muscle weakness (generalized): Secondary | ICD-10-CM

## 2016-05-04 DIAGNOSIS — M25552 Pain in left hip: Secondary | ICD-10-CM | POA: Diagnosis not present

## 2016-05-04 NOTE — Therapy (Signed)
[facility] [address], [place], [place], [address] Phone: [phone]   Fax:  [phone]  Physical Therapy Treatment  Patient Details  Name: [name] MRN: [id number] Date of Birth: 1941/05/20 Referring Provider: Benjaman Lobe PA-C  Encounter Date: 05/04/2016      PT End of Session - 05/04/16 1306    Visit Number 10   Number of Visits 20   Date for PT Re-Evaluation 05/27/16   Authorization Type Pt has had 15 visits this year, KX on all visits.   PT Start Time 1225   PT Stop Time 1312   PT Time Calculation (min) 47 min   Activity Tolerance Patient tolerated treatment well   Behavior During Therapy WFL for tasks assessed/performed      Past Medical History:  Diagnosis Date  . Arthritis    right shoulder replacement, right replacement   . Atrial fibrillation [facility])     Past Surgical History:  Procedure Laterality Date  . JOINT REPLACEMENT     right shoulder 2008; right hip 2016    There were no vitals filed for this visit.      Subjective Assessment - 05/04/16 1235    Subjective Pt reports that she is 60% overall improved since the start of care.  Will see MD 05/26/16.   Pertinent History right hip replacement 2016; right shoulder replacement 2008.  Pt had injection into Lt groin- very minmal relief   Patient Stated Goals reduce pain in Lt LE, return to the gym to regularly exercise   Currently in Pain? No/denies                         [facility] Adult PT Treatment/Exercise - 05/04/16 0001      Lumbar Exercises: Stretches   Active Hamstring Stretch 3 reps;30 seconds   Single Knee to Chest Stretch 3 reps;20 seconds     Knee/Hip Exercises: Aerobic   Stationary Bike Level 2 x 8 minutes     Knee/Hip Exercises: Standing   Rebounder weight shifting 3 ways x 1 minute    Walking with Sports Cord 20# 2x10 holding handles     Knee/Hip Exercises: Seated   Sit to Sand 2 sets;10  reps     Knee/Hip Exercises: Supine   Hip Adduction Isometric Strengthening;Both;2 sets;10 reps     Knee/Hip Exercises: Sidelying   Clams 20 reps                  PT Short Term Goals - 04/20/16 1147      PT SHORT TERM GOAL #1   Title be independent in initial HEP   Status Achieved     PT SHORT TERM GOAL #2   Title verbalize and demonstrate understanding of body mechanics and correct posture to reduce risk of fracture associated with osteopenia   Status Achieved     PT SHORT TERM GOAL #3   Title report a 30% reduction in LBP and Lt LE pain with ADLs and self-care   Status Achieved     PT SHORT TERM GOAL #4   Title Patient will report 2/10 pain at worst in her bilateral hips   Status Achieved           PT Long Term Goals - 05/04/16 1237      PT LONG TERM GOAL #1   Title be independent in advanced HEP   Time 8   Period Weeks  Status On-going     PT LONG TERM GOAL #3   Title report a 60% reduction in LBP and Lt LE pain with standing and walking   Status Achieved     PT LONG TERM GOAL #4   Title return to regular gym exercise routine without limitation   Status Achieved               Plan - 05-10-2016 1240    Clinical Impression Statement Pt reports 60% overall improvement in symptoms since the start of care.  No signficant pain over the past few days.  Pt has been doing exercises as schedule permits at home.  Pt able to tolerate advancement of resistance exercises over the past few visits.  FOTO score is improved to 37% limitation (improved from 48% limitation at evaluation).  Pt will attend next session for advancement of exercises and will be placed on hold until the end of November.     Rehab Potential Good   Clinical Impairments Affecting Rehab Potential on blood thinner so bruises easily   PT Frequency 2x / week   PT Duration 8 weeks   PT Treatment/Interventions ADLs/Self Care Home Management;Cryotherapy;Electrical Stimulation;Moist  Heat;Iontophoresis 4mg /ml Dexamethasone;Therapeutic exercise;Therapeutic activities;Functional mobility training;Stair training;Gait training;Patient/family education;Manual techniques;Energy conservation;Splinting;Passive range of motion;Neuromuscular re-education;Ultrasound;Dry needling   PT Next Visit Plan 1 more session and then place on hold until the end of November.     Consulted and Agree with Plan of Care Patient      Patient will benefit from skilled therapeutic intervention in order to improve the following deficits and impairments:  Decreased strength, Decreased mobility, Postural dysfunction, Improper body mechanics, Impaired flexibility, Hypomobility, Decreased activity tolerance, Decreased endurance, Difficulty walking, Decreased range of motion  Visit Diagnosis: Pain in left hip  Stiffness of left hip, not elsewhere classified  Muscle weakness (generalized)       G-Codes - 10-May-2016 1244    Functional Assessment Tool Used FOTO: 37% limitation   Functional Limitation Mobility: Walking and moving around   Mobility: Walking and Moving Around Current Status [phone]) At least 20 percent but less than 40 percent impaired, limited or restricted   Mobility: Walking and Moving Around Goal Status [phone]) At least 20 percent but less than 40 percent impaired, limited or restricted      Problem List There are no active problems to display for this patient.    Sigurd Sos, PT 05-10-2016 1:10 PM  [facility] [address] [place], [place], [address] Phone: [phone]   Fax:  [phone]  Name: [name] MRN: [id number] Date of Birth: [birth year]

## 2016-05-06 ENCOUNTER — Ambulatory Visit: Payer: Medicare Other

## 2016-05-06 DIAGNOSIS — M25552 Pain in left hip: Secondary | ICD-10-CM | POA: Diagnosis not present

## 2016-05-06 DIAGNOSIS — M6281 Muscle weakness (generalized): Secondary | ICD-10-CM | POA: Diagnosis not present

## 2016-05-06 DIAGNOSIS — R252 Cramp and spasm: Secondary | ICD-10-CM | POA: Diagnosis not present

## 2016-05-06 DIAGNOSIS — M25652 Stiffness of left hip, not elsewhere classified: Secondary | ICD-10-CM | POA: Diagnosis not present

## 2016-05-06 NOTE — Therapy (Addendum)
[facility] [address] [place], [place], [address] Phone: [phone]   Fax:  [phone]  Physical Therapy Treatment  Patient Details  Name: [name] MRN: [id number] Date of Birth: Mar 31, 1941 Referring Provider: Benjaman Lobe PA-C  Encounter Date: 05/06/2016      PT End of Session - 05/06/16 1140    Visit Number 11   Number of Visits 20   Date for PT Re-Evaluation 05/27/16   Authorization Type Pt has had 15 visits this year, KX on all visits.   PT Start Time 1105   PT Stop Time 1145   PT Time Calculation (min) 40 min   Activity Tolerance Patient tolerated treatment well   Behavior During Therapy WFL for tasks assessed/performed      Past Medical History:  Diagnosis Date  . Arthritis    right shoulder replacement, right replacement   . Atrial fibrillation [facility])     Past Surgical History:  Procedure Laterality Date  . JOINT REPLACEMENT     right shoulder 2008; right hip 2016    There were no vitals filed for this visit.      Subjective Assessment - 05/06/16 1109    Subjective Doing well.  Stiff this morning.  60% overall improvement.  Will see MD 05/26/16.     Pertinent History right hip replacement 2016; right shoulder replacement 2008.  Pt had injection into Lt groin- very minmal relief   Currently in Pain? No/denies                         [facility] Adult PT Treatment/Exercise - 05/06/16 0001      Lumbar Exercises: Stretches   Active Hamstring Stretch 3 reps;30 seconds   Single Knee to Chest Stretch 3 reps;20 seconds     Knee/Hip Exercises: Aerobic   Stationary Bike Level 2 x 8 minutes     Knee/Hip Exercises: Standing   Rebounder weight shifting 3 ways x 1 minute    Walking with Sports Cord 20# 2x10 holding handles     Knee/Hip Exercises: Seated   Sit to Sand 2 sets;10 reps     Knee/Hip Exercises: Supine   Hip Adduction Isometric Strengthening;Both;2 sets;10  reps     Knee/Hip Exercises: Sidelying   Clams 20 reps                  PT Short Term Goals - 04/20/16 1147      PT SHORT TERM GOAL #1   Title be independent in initial HEP   Status Achieved     PT SHORT TERM GOAL #2   Title verbalize and demonstrate understanding of body mechanics and correct posture to reduce risk of fracture associated with osteopenia   Status Achieved     PT SHORT TERM GOAL #3   Title report a 30% reduction in LBP and Lt LE pain with ADLs and self-care   Status Achieved     PT SHORT TERM GOAL #4   Title Patient will report 2/10 pain at worst in her bilateral hips   Status Achieved           PT Long Term Goals - 05/04/16 1237      PT LONG TERM GOAL #1   Title be independent in advanced HEP   Time 8   Period Weeks   Status On-going     PT LONG TERM GOAL #3   Title report a 60% reduction  in LBP and Lt LE pain with standing and walking   Status Achieved     PT LONG TERM GOAL #4   Title return to regular gym exercise routine without limitation   Status Achieved               Plan - 2016-05-14 1110    Clinical Impression Statement Pt reports 60% overall improvement in symptoms since the start of care.  No pain over the past few days.  Pt has been doing exercises as schedule permits at home.  Pt has been able to tolerate advancement of exercise over the past few weeks.  FOTO scroe is improved to 37% (improved from 48% limitation at evaluation).  Pt will be placed on hold until she sees MD next session.     Rehab Potential Good   PT Frequency 2x / week   PT Duration 8 weeks   PT Treatment/Interventions ADLs/Self Care Home Management;Cryotherapy;Electrical Stimulation;Moist Heat;Iontophoresis 91m/ml Dexamethasone;Therapeutic exercise;Therapeutic activities;Functional mobility training;Stair training;Gait training;Patient/family education;Manual techniques;Energy conservation;Splinting;Passive range of motion;Neuromuscular  re-education;Ultrasound;Dry needling   PT Next Visit Plan Hold chart until after MD appointment.  Probable discharge.   Consulted and Agree with Plan of Care Patient      Patient will benefit from skilled therapeutic intervention in order to improve the following deficits and impairments:  Decreased strength, Decreased mobility, Postural dysfunction, Improper body mechanics, Impaired flexibility, Hypomobility, Decreased activity tolerance, Decreased endurance, Difficulty walking, Decreased range of motion  Visit Diagnosis: Pain in left hip  Stiffness of left hip, not elsewhere classified  Muscle weakness (generalized)     Problem List There are no active problems to display for this patient.    KSigurd Sos PT 111/16/201711:44 AM g-codes: mobility Category CJ: Goal status CJ: D/C status  PHYSICAL THERAPY DISCHARGE SUMMARY  Visits from Start of Care: 11  Current functional level related to goals / functional outcomes: See above for most current status.  Pt will D/C to HEP   Remaining deficits: No significant deficits remain.     Education / Equipment: HEP Plan: Patient agrees to discharge.  Patient goals were met. Patient is being discharged due to meeting the stated rehab goals.  ?????        KSigurd Sos PT 05/28/16 9:24 AM  [facility] [address] [place] [address]Phone: [phone]  Fax:  [phone] Name: Ashley KUBISIAKMRN: [id number] of Birth: 107/07/1940

## 2016-05-26 DIAGNOSIS — Z881 Allergy status to other antibiotic agents status: Secondary | ICD-10-CM | POA: Diagnosis not present

## 2016-05-26 DIAGNOSIS — M7061 Trochanteric bursitis, right hip: Secondary | ICD-10-CM | POA: Diagnosis not present

## 2016-05-26 DIAGNOSIS — Z96641 Presence of right artificial hip joint: Secondary | ICD-10-CM | POA: Diagnosis not present

## 2016-05-26 DIAGNOSIS — I4891 Unspecified atrial fibrillation: Secondary | ICD-10-CM | POA: Diagnosis not present

## 2016-05-26 DIAGNOSIS — E785 Hyperlipidemia, unspecified: Secondary | ICD-10-CM | POA: Diagnosis not present

## 2016-05-26 DIAGNOSIS — Z886 Allergy status to analgesic agent status: Secondary | ICD-10-CM | POA: Diagnosis not present

## 2016-05-26 DIAGNOSIS — Z888 Allergy status to other drugs, medicaments and biological substances status: Secondary | ICD-10-CM | POA: Diagnosis not present

## 2016-05-26 DIAGNOSIS — M7062 Trochanteric bursitis, left hip: Secondary | ICD-10-CM | POA: Diagnosis not present

## 2016-06-02 DIAGNOSIS — K644 Residual hemorrhoidal skin tags: Secondary | ICD-10-CM | POA: Diagnosis not present

## 2016-06-02 DIAGNOSIS — D125 Benign neoplasm of sigmoid colon: Secondary | ICD-10-CM | POA: Diagnosis not present

## 2016-06-02 DIAGNOSIS — Z1211 Encounter for screening for malignant neoplasm of colon: Secondary | ICD-10-CM | POA: Diagnosis not present

## 2016-06-02 DIAGNOSIS — K5730 Diverticulosis of large intestine without perforation or abscess without bleeding: Secondary | ICD-10-CM | POA: Diagnosis not present

## 2016-06-06 DIAGNOSIS — L509 Urticaria, unspecified: Secondary | ICD-10-CM | POA: Diagnosis not present

## 2016-07-09 DIAGNOSIS — Z96611 Presence of right artificial shoulder joint: Secondary | ICD-10-CM | POA: Diagnosis not present

## 2016-07-09 DIAGNOSIS — Z79899 Other long term (current) drug therapy: Secondary | ICD-10-CM | POA: Diagnosis not present

## 2016-07-09 DIAGNOSIS — Z7982 Long term (current) use of aspirin: Secondary | ICD-10-CM | POA: Diagnosis not present

## 2016-07-09 DIAGNOSIS — I4891 Unspecified atrial fibrillation: Secondary | ICD-10-CM | POA: Diagnosis not present

## 2016-07-09 DIAGNOSIS — M19012 Primary osteoarthritis, left shoulder: Secondary | ICD-10-CM | POA: Diagnosis not present

## 2016-07-09 DIAGNOSIS — M85811 Other specified disorders of bone density and structure, right shoulder: Secondary | ICD-10-CM | POA: Diagnosis not present

## 2016-07-09 DIAGNOSIS — Z881 Allergy status to other antibiotic agents status: Secondary | ICD-10-CM | POA: Diagnosis not present

## 2016-07-09 DIAGNOSIS — M85812 Other specified disorders of bone density and structure, left shoulder: Secondary | ICD-10-CM | POA: Diagnosis not present

## 2016-07-09 DIAGNOSIS — M19011 Primary osteoarthritis, right shoulder: Secondary | ICD-10-CM | POA: Insufficient documentation

## 2016-07-09 DIAGNOSIS — T8484XA Pain due to internal orthopedic prosthetic devices, implants and grafts, initial encounter: Secondary | ICD-10-CM | POA: Diagnosis not present

## 2016-07-09 DIAGNOSIS — Z886 Allergy status to analgesic agent status: Secondary | ICD-10-CM | POA: Diagnosis not present

## 2016-07-09 DIAGNOSIS — Z885 Allergy status to narcotic agent status: Secondary | ICD-10-CM | POA: Diagnosis not present

## 2016-07-09 DIAGNOSIS — E785 Hyperlipidemia, unspecified: Secondary | ICD-10-CM | POA: Diagnosis not present

## 2016-07-09 DIAGNOSIS — Z91048 Other nonmedicinal substance allergy status: Secondary | ICD-10-CM | POA: Diagnosis not present

## 2016-07-27 DIAGNOSIS — M19011 Primary osteoarthritis, right shoulder: Secondary | ICD-10-CM | POA: Diagnosis not present

## 2016-07-27 DIAGNOSIS — Z96611 Presence of right artificial shoulder joint: Secondary | ICD-10-CM | POA: Diagnosis not present

## 2016-07-27 DIAGNOSIS — Z471 Aftercare following joint replacement surgery: Secondary | ICD-10-CM | POA: Diagnosis not present

## 2016-07-27 DIAGNOSIS — M19012 Primary osteoarthritis, left shoulder: Secondary | ICD-10-CM | POA: Diagnosis not present

## 2016-08-03 DIAGNOSIS — M19011 Primary osteoarthritis, right shoulder: Secondary | ICD-10-CM | POA: Diagnosis not present

## 2016-08-03 DIAGNOSIS — T8484XA Pain due to internal orthopedic prosthetic devices, implants and grafts, initial encounter: Secondary | ICD-10-CM | POA: Diagnosis not present

## 2016-08-03 DIAGNOSIS — Z96611 Presence of right artificial shoulder joint: Secondary | ICD-10-CM | POA: Diagnosis not present

## 2016-08-03 DIAGNOSIS — M25511 Pain in right shoulder: Secondary | ICD-10-CM | POA: Diagnosis not present

## 2016-08-03 DIAGNOSIS — M19012 Primary osteoarthritis, left shoulder: Secondary | ICD-10-CM | POA: Diagnosis not present

## 2016-08-03 DIAGNOSIS — T8484XS Pain due to internal orthopedic prosthetic devices, implants and grafts, sequela: Secondary | ICD-10-CM | POA: Diagnosis not present

## 2016-08-08 DIAGNOSIS — R05 Cough: Secondary | ICD-10-CM | POA: Diagnosis not present

## 2016-08-08 DIAGNOSIS — R197 Diarrhea, unspecified: Secondary | ICD-10-CM | POA: Diagnosis not present

## 2016-08-12 DIAGNOSIS — R05 Cough: Secondary | ICD-10-CM | POA: Diagnosis not present

## 2016-08-19 DIAGNOSIS — J329 Chronic sinusitis, unspecified: Secondary | ICD-10-CM | POA: Diagnosis not present

## 2016-08-19 DIAGNOSIS — R062 Wheezing: Secondary | ICD-10-CM | POA: Diagnosis not present

## 2016-11-11 DIAGNOSIS — H401111 Primary open-angle glaucoma, right eye, mild stage: Secondary | ICD-10-CM | POA: Diagnosis not present

## 2016-12-11 DIAGNOSIS — I4891 Unspecified atrial fibrillation: Secondary | ICD-10-CM | POA: Diagnosis not present

## 2016-12-23 DIAGNOSIS — R05 Cough: Secondary | ICD-10-CM | POA: Diagnosis not present

## 2016-12-23 DIAGNOSIS — H9209 Otalgia, unspecified ear: Secondary | ICD-10-CM | POA: Diagnosis not present

## 2017-02-17 DIAGNOSIS — Z803 Family history of malignant neoplasm of breast: Secondary | ICD-10-CM | POA: Diagnosis not present

## 2017-02-17 DIAGNOSIS — Z1231 Encounter for screening mammogram for malignant neoplasm of breast: Secondary | ICD-10-CM | POA: Diagnosis not present

## 2017-03-04 DIAGNOSIS — Z79899 Other long term (current) drug therapy: Secondary | ICD-10-CM | POA: Diagnosis not present

## 2017-03-04 DIAGNOSIS — E7800 Pure hypercholesterolemia, unspecified: Secondary | ICD-10-CM | POA: Diagnosis not present

## 2017-03-04 DIAGNOSIS — M25569 Pain in unspecified knee: Secondary | ICD-10-CM | POA: Diagnosis not present

## 2017-03-04 DIAGNOSIS — E559 Vitamin D deficiency, unspecified: Secondary | ICD-10-CM | POA: Diagnosis not present

## 2017-03-04 DIAGNOSIS — Z0000 Encounter for general adult medical examination without abnormal findings: Secondary | ICD-10-CM | POA: Diagnosis not present

## 2017-03-04 DIAGNOSIS — R21 Rash and other nonspecific skin eruption: Secondary | ICD-10-CM | POA: Diagnosis not present

## 2017-03-04 DIAGNOSIS — R0981 Nasal congestion: Secondary | ICD-10-CM | POA: Diagnosis not present

## 2017-03-04 DIAGNOSIS — Z23 Encounter for immunization: Secondary | ICD-10-CM | POA: Diagnosis not present

## 2017-05-26 DIAGNOSIS — H401111 Primary open-angle glaucoma, right eye, mild stage: Secondary | ICD-10-CM | POA: Diagnosis not present

## 2017-05-26 DIAGNOSIS — H40022 Open angle with borderline findings, high risk, left eye: Secondary | ICD-10-CM | POA: Diagnosis not present

## 2017-11-02 DIAGNOSIS — K643 Fourth degree hemorrhoids: Secondary | ICD-10-CM | POA: Diagnosis not present

## 2017-11-04 DIAGNOSIS — K648 Other hemorrhoids: Secondary | ICD-10-CM | POA: Insufficient documentation

## 2017-12-15 DIAGNOSIS — H40022 Open angle with borderline findings, high risk, left eye: Secondary | ICD-10-CM | POA: Diagnosis not present

## 2017-12-15 DIAGNOSIS — H401111 Primary open-angle glaucoma, right eye, mild stage: Secondary | ICD-10-CM | POA: Diagnosis not present

## 2017-12-15 DIAGNOSIS — H26492 Other secondary cataract, left eye: Secondary | ICD-10-CM | POA: Diagnosis not present

## 2018-02-17 DIAGNOSIS — I4891 Unspecified atrial fibrillation: Secondary | ICD-10-CM | POA: Diagnosis not present

## 2018-02-17 DIAGNOSIS — E782 Mixed hyperlipidemia: Secondary | ICD-10-CM | POA: Diagnosis not present

## 2018-02-18 DIAGNOSIS — Z79899 Other long term (current) drug therapy: Secondary | ICD-10-CM | POA: Diagnosis not present

## 2018-02-18 DIAGNOSIS — E782 Mixed hyperlipidemia: Secondary | ICD-10-CM | POA: Diagnosis not present

## 2018-02-21 DIAGNOSIS — Z1231 Encounter for screening mammogram for malignant neoplasm of breast: Secondary | ICD-10-CM | POA: Diagnosis not present

## 2018-02-21 DIAGNOSIS — Z803 Family history of malignant neoplasm of breast: Secondary | ICD-10-CM | POA: Diagnosis not present

## 2018-03-28 DIAGNOSIS — E559 Vitamin D deficiency, unspecified: Secondary | ICD-10-CM | POA: Diagnosis not present

## 2018-03-28 DIAGNOSIS — E7800 Pure hypercholesterolemia, unspecified: Secondary | ICD-10-CM | POA: Diagnosis not present

## 2018-03-28 DIAGNOSIS — Z131 Encounter for screening for diabetes mellitus: Secondary | ICD-10-CM | POA: Diagnosis not present

## 2018-03-28 DIAGNOSIS — Z79899 Other long term (current) drug therapy: Secondary | ICD-10-CM | POA: Diagnosis not present

## 2018-03-31 DIAGNOSIS — J309 Allergic rhinitis, unspecified: Secondary | ICD-10-CM | POA: Diagnosis not present

## 2018-03-31 DIAGNOSIS — Z23 Encounter for immunization: Secondary | ICD-10-CM | POA: Diagnosis not present

## 2018-03-31 DIAGNOSIS — R351 Nocturia: Secondary | ICD-10-CM | POA: Diagnosis not present

## 2018-03-31 DIAGNOSIS — M25519 Pain in unspecified shoulder: Secondary | ICD-10-CM | POA: Diagnosis not present

## 2018-03-31 DIAGNOSIS — Z0000 Encounter for general adult medical examination without abnormal findings: Secondary | ICD-10-CM | POA: Diagnosis not present

## 2018-03-31 DIAGNOSIS — L309 Dermatitis, unspecified: Secondary | ICD-10-CM | POA: Diagnosis not present

## 2018-03-31 DIAGNOSIS — I4891 Unspecified atrial fibrillation: Secondary | ICD-10-CM | POA: Diagnosis not present

## 2018-03-31 DIAGNOSIS — E7800 Pure hypercholesterolemia, unspecified: Secondary | ICD-10-CM | POA: Diagnosis not present

## 2018-04-15 DIAGNOSIS — M25511 Pain in right shoulder: Secondary | ICD-10-CM | POA: Diagnosis not present

## 2018-04-15 DIAGNOSIS — M19012 Primary osteoarthritis, left shoulder: Secondary | ICD-10-CM | POA: Diagnosis not present

## 2018-05-09 DIAGNOSIS — M8588 Other specified disorders of bone density and structure, other site: Secondary | ICD-10-CM | POA: Diagnosis not present

## 2018-06-01 DIAGNOSIS — R350 Frequency of micturition: Secondary | ICD-10-CM | POA: Diagnosis not present

## 2018-06-01 DIAGNOSIS — R351 Nocturia: Secondary | ICD-10-CM | POA: Diagnosis not present

## 2018-06-01 DIAGNOSIS — N3941 Urge incontinence: Secondary | ICD-10-CM | POA: Diagnosis not present

## 2018-06-08 DIAGNOSIS — H401111 Primary open-angle glaucoma, right eye, mild stage: Secondary | ICD-10-CM | POA: Diagnosis not present

## 2018-10-04 DIAGNOSIS — I4891 Unspecified atrial fibrillation: Secondary | ICD-10-CM | POA: Diagnosis not present

## 2018-10-04 DIAGNOSIS — N183 Chronic kidney disease, stage 3 (moderate): Secondary | ICD-10-CM | POA: Diagnosis not present

## 2018-10-04 DIAGNOSIS — E7800 Pure hypercholesterolemia, unspecified: Secondary | ICD-10-CM | POA: Diagnosis not present

## 2019-02-27 DIAGNOSIS — Z803 Family history of malignant neoplasm of breast: Secondary | ICD-10-CM | POA: Diagnosis not present

## 2019-02-27 DIAGNOSIS — Z1231 Encounter for screening mammogram for malignant neoplasm of breast: Secondary | ICD-10-CM | POA: Diagnosis not present

## 2019-03-22 DIAGNOSIS — Z23 Encounter for immunization: Secondary | ICD-10-CM | POA: Diagnosis not present

## 2019-03-22 DIAGNOSIS — G479 Sleep disorder, unspecified: Secondary | ICD-10-CM | POA: Diagnosis not present

## 2019-03-22 DIAGNOSIS — Z79899 Other long term (current) drug therapy: Secondary | ICD-10-CM | POA: Diagnosis not present

## 2019-03-22 DIAGNOSIS — E559 Vitamin D deficiency, unspecified: Secondary | ICD-10-CM | POA: Diagnosis not present

## 2019-03-22 DIAGNOSIS — R413 Other amnesia: Secondary | ICD-10-CM | POA: Diagnosis not present

## 2019-03-22 DIAGNOSIS — E7800 Pure hypercholesterolemia, unspecified: Secondary | ICD-10-CM | POA: Diagnosis not present

## 2019-04-25 DIAGNOSIS — E7800 Pure hypercholesterolemia, unspecified: Secondary | ICD-10-CM | POA: Diagnosis not present

## 2019-04-25 DIAGNOSIS — I4891 Unspecified atrial fibrillation: Secondary | ICD-10-CM | POA: Diagnosis not present

## 2019-04-25 DIAGNOSIS — N1831 Chronic kidney disease, stage 3a: Secondary | ICD-10-CM | POA: Diagnosis not present

## 2019-05-24 ENCOUNTER — Encounter: Payer: Self-pay | Admitting: *Deleted

## 2019-05-29 ENCOUNTER — Ambulatory Visit (INDEPENDENT_AMBULATORY_CARE_PROVIDER_SITE_OTHER): Payer: Medicare Other | Admitting: Neurology

## 2019-05-29 ENCOUNTER — Encounter

## 2019-05-29 ENCOUNTER — Other Ambulatory Visit: Payer: Self-pay

## 2019-05-29 ENCOUNTER — Encounter: Payer: Self-pay | Admitting: Neurology

## 2019-05-29 VITALS — BP 132/62 | HR 45 | Temp 97.9°F | Ht 61.0 in | Wt 172.0 lb

## 2019-05-29 DIAGNOSIS — R4189 Other symptoms and signs involving cognitive functions and awareness: Secondary | ICD-10-CM | POA: Diagnosis not present

## 2019-05-29 DIAGNOSIS — R413 Other amnesia: Secondary | ICD-10-CM | POA: Diagnosis not present

## 2019-05-29 DIAGNOSIS — G9340 Encephalopathy, unspecified: Secondary | ICD-10-CM | POA: Diagnosis not present

## 2019-05-29 NOTE — Patient Instructions (Signed)
MRI of the brain  Formal memory testing (Dr. Sima Matas) Sleep test - evaluation by sleep doctor Bloodwork  Memory Compensation Strategies  1. Use "WARM" strategy.  W= write it down  A= associate it  R= repeat it  M= make a mental note  2.   You can keep a Social worker.  Use a 3-ring notebook with sections for the following: calendar, important names and phone numbers,  medications, doctors' names/phone numbers, lists/reminders, and a section to journal what you did  each day.   3.    Use a calendar to write appointments down.  4.    Write yourself a schedule for the day.  This can be placed on the calendar or in a separate section of the Memory Notebook.  Keeping a  regular schedule can help memory.  5.    Use medication organizer with sections for each day or morning/evening pills.  You may need help loading it  6.    Keep a basket, or pegboard by the door.  Place items that you need to take out with you in the basket or on the pegboard.  You may also want to  include a message board for reminders.  7.    Use sticky notes.  Place sticky notes with reminders in a place where the task is performed.  For example: " turn off the  stove" placed by the stove, "lock the door" placed on the door at eye level, " take your medications" on  the bathroom mirror or by the place where you normally take your medications.  8.    Use alarms/timers.  Use while cooking to remind yourself to check on food or as a reminder to take your medicine, or as a  reminder to make a call, or as a reminder to perform another task, etc.

## 2019-05-29 NOTE — Progress Notes (Signed)
[id number]    Provider:  Dr Jaynee Eagles Requesting Provider: [name], MD Primary Care Provider:  [name], MD  CC:  Memory loss  HPI:  [name] is a 78 y.o. female here as requested by [name], MD for memory problems. pmhX CHRONIC PANCREATITIS, AFIB, ARTHRITIS. She has a problem remembering names. Even close people. She may be talking to them and then forget but the name eventually comes back. Started within the last 5-7 years. She feels it is worsening. With the virus, she is isolated and she watches TV, she wondersif this is affceting her. She also feels depresed, and she feels tired, no energy, decreased concentration, memory worsening. She naps multiple times a day. Her mother had dementia, severely, it started in her 51s and worsened in her 11s, passed away at 30. She feels it gets worse and then has a good day. She was diagnosed possibly with sleep apnea. No snoring but is very fatiguedm naps all days, she was told in the past that she may have OSA and to get  Pillow to elevate head and that helped a lot. She wakes frequent and she was placed on a cpap machine in the past. No other focal neurologic deficits, associated symptoms, inciting events or modifiable factors.  Review of Systems: Patient complains of symptoms per HPI as well as the following symptoms: memory loss. Pertinent negatives and positives per HPI. All others negative.   Social History   Socioeconomic History  . Marital status: Married    Spouse name: Not on file  . Number of children: Not on file  . Years of education: 30  . Highest education level: Some college, no degree  Occupational History  . Not on file  Social Needs  . Financial resource strain: Not on file  . Food insecurity    Worry: Not on file    Inability: Not on file  . Transportation needs    Medical: Not on file    Non-medical: Not on file  Tobacco Use  . Smoking status: Former Smoker   Packs/day: 2.50  . Smokeless tobacco: Never Used  Substance and Sexual Activity  . Alcohol use: Not Currently    Comment: none in last 40 days  . Drug use: Never  . Sexual activity: Not on file  Lifestyle  . Physical activity    Days per week: Not on file    Minutes per session: Not on file  . Stress: Not on file  Relationships  . Social Herbalist on phone: Not on file    Gets together: Not on file    Attends religious service: Not on file    Active member of club or organization: Not on file    Attends meetings of clubs or organizations: Not on file    Relationship status: Not on file  . Intimate partner violence    Fear of current or ex partner: Not on file    Emotionally abused: Not on file    Physically abused: Not on file    Forced sexual activity: Not on file  Other Topics Concern  . Not on file  Social History Narrative   Lives at home with husband    Family History  Problem Relation Age of Onset  . Dementia Mother   . Memory loss Brother     Past Medical History:  Diagnosis Date  . Arthritis    right shoulder replacement, right replacement   .  Atrial fibrillation ([place])   . Chronic pancreatitis ([place])   . Syncope 03/2011    There are no active problems to display for this patient.   Past Surgical History:  Procedure Laterality Date  . CHOLECYSTECTOMY  1998  . JOINT REPLACEMENT     right shoulder 2008; right hip 2016    Current Outpatient Medications  Medication Sig Dispense Refill  . acetaminophen (TYLENOL) 500 MG chewable tablet Chew 500 mg by mouth. Takes up to 6 a day.    Marland Kitchen aspirin 325 MG EC tablet Take 325 mg by mouth daily.    Marland Kitchen atenolol (TENORMIN) 25 MG tablet TAKE 1 TABLET BY MOUTH DAILY.    . B Complex-Biotin-FA (VITAMIN B50 COMPLEX PO) Take 0.5 tablets by mouth.    Marland Kitchen CALCIUM PO Take 1,000 mg by mouth daily.     . Cholecalciferol (VITAMIN D3 PO) Take 2,000 Units by mouth daily.     . Coenzyme Q10 (CO Q 10 PO) Take 300 mg by  mouth daily.     . magnesium oxide (MAG-OX) 400 MG tablet Take by mouth.    . Melatonin 1 MG CAPS Take 5 mg by mouth at bedtime as needed.     . Multiple Vitamin (MULTI-VITAMINS) TABS Take by mouth.    . Omega-3 Fatty Acids (FISH OIL PO) Take by mouth.    . pravastatin (PRAVACHOL) 20 MG tablet Take 20 mg by mouth daily.    . vitamin E 400 UNIT capsule Take by mouth.     No current facility-administered medications for this visit.     Allergies as of 05/29/2019 - Review Complete 05/06/2016  Allergen Reaction Noted  . Cephalexin  05/24/2019  . Hydrocodone-guaifenesin  05/24/2019  . Tape  05/24/2019  . Tramadol  05/24/2019    Vitals: BP 132/62 (BP Location: Right Arm, Patient Position: Sitting)   Pulse (!) 45   Temp 97.9 F (36.6 C) Comment: taken at front  Ht 5\' 1"  (1.549 m)   Wt 172 lb (78 kg)   BMI 32.50 kg/m  Last Weight:  Wt Readings from Last 1 Encounters:  05/29/19 172 lb (78 kg)   Last Height:   Ht Readings from Last 1 Encounters:  05/29/19 5\' 1"  (1.549 m)     Physical exam: Exam: Gen: NAD, conversant, well nourised, obese, well groomed                     CV: RRR, no MRG. No Carotid Bruits. No peripheral edema, warm, nontender Eyes: Conjunctivae clear without exudates or hemorrhage  Neuro: Detailed Neurologic Exam  Speech:    Speech is normal; fluent and spontaneous with normal comprehension.  Cognition:  MMSE - Mini Mental State Exam 05/29/2019  Orientation to time 5  Orientation to Place 5  Registration 3  Attention/ Calculation 5  Recall 2  Language- name 2 objects 2  Language- repeat 0  Language- follow 3 step command 3  Language- read & follow direction 1  Write a sentence 1  Copy design 1  Total score 28        language fluent;     normal attention, concentration Cranial Nerves:    The pupils are equal, round, and reactive to light. Attempted fundoscopy could n Visual fields are full to finger confrontation. Extraocular movements are  intact. Trigeminal sensation is intact and the muscles of mastication are normal. The face is symmetric. The palate elevates in the midline. Hearing intact. Voice is normal. Shoulder shrug  is normal. The tongue has normal motion without fasciculations.   Coordination:    No dysmetria  Gait:    Normal stride and stance  Motor Observation:    No asymmetry, no atrophy, and no involuntary movements noted. Tone:    Normal muscle tone.    Posture:    Posture is normal.     Strength:    Strength is symmetric in the upper and lower limbs.      Sensation: intact to LT     Reflex Exam:  DTR's:    Deep tendon reflexes in the upper and lower extremities are symmetric bilaterally and slightly brisk  Toes:    The toes are downgoing bilaterally.   Clonus:    Clonus is absent.    Assessment/Plan:   78 y.o. female here as requested by [name], MD for memory problems. pmhX CHRONIC PANCREATITIS, AFIB, ARTHRITIS. MMSE 28/30. However her mother had Alzheimer's disease, brother currently impaired, and she is concerned about her memory changes. I do not think patient has dementia, likely MCI vs normal cognitive aging with depression, decreased social and physical activity due to covid, however given symptoms I recommend evaluation:  - MRI brain w/wo contrast to evaluate for causes of cognitive dysfunction - Sleep eval: Had a cpap in the past, stopped using it, is fatigued, naps all day, memory loss, repeat testing, needs sleep testing -formal memory testing.  - Blood work today  Orders Placed This Encounter  Procedures  . MR BRAIN W WO CONTRAST  . B12 and Folate Panel  . Methylmalonic Acid, Serum  . Vitamin B1  . TSH  . Basic Metabolic Panel  . Homocysteine  . Ambulatory referral to Neuropsychology    Cc: [name], MD  Sarina Ill, MD  [facility] [address] [place], [place] [address]  Phone [phone] Fax [phone]

## 2019-06-02 LAB — BASIC METABOLIC PANEL
BUN/Creatinine Ratio: 34 — ABNORMAL HIGH (ref 12–28)
BUN: 29 mg/dL — ABNORMAL HIGH (ref 8–27)
CO2: 24 mmol/L (ref 20–29)
Calcium: 9.1 mg/dL (ref 8.7–10.3)
Chloride: 104 mmol/L (ref 96–106)
Creatinine, Ser: 0.86 mg/dL (ref 0.57–1.00)
GFR calc Af Amer: 74 mL/min/{1.73_m2} (ref >59)
GFR calc non Af Amer: 64 mL/min/{1.73_m2} (ref >59)
Glucose: 95 mg/dL (ref 65–99)
Potassium: 4.9 mmol/L (ref 3.5–5.2)
Sodium: 141 mmol/L (ref 134–144)

## 2019-06-02 LAB — B12 AND FOLATE PANEL
Folate: 16.1 ng/mL (ref >3.0)
Vitamin B-12: 560 pg/mL (ref 232–1245)

## 2019-06-02 LAB — TSH: TSH: 1.91 u[IU]/mL (ref 0.450–4.500)

## 2019-06-02 LAB — VITAMIN B1: Thiamine: 150.1 nmol/L (ref 66.5–200.0)

## 2019-06-02 LAB — METHYLMALONIC ACID, SERUM: Methylmalonic Acid: 133 nmol/L (ref 0–378)

## 2019-06-02 LAB — HOMOCYSTEINE: Homocysteine: 12 umol/L (ref 0.0–19.2)

## 2019-06-06 ENCOUNTER — Telehealth: Payer: Self-pay | Admitting: *Deleted

## 2019-06-06 NOTE — Telephone Encounter (Addendum)
Spoke with Dr. Jaynee Eagles. Pt is a little dehydrated but everything else is normal. Will notify patient.   Called pt and advised her of the lab results. Pt understands her labs showed she is a little dehydrated. She will drink more water. Otherwise labs are normal. She verbalized appreciation for the call.   ----- Message from Melvenia Beam, MD sent at 05/31/2019  6:05 PM EST ----- So far labs are normal. A few a still pending, we will call if anything comes back abnormal or concerning thanks.

## 2019-06-27 ENCOUNTER — Other Ambulatory Visit: Payer: Medicare Other

## 2019-07-03 DIAGNOSIS — E7800 Pure hypercholesterolemia, unspecified: Secondary | ICD-10-CM | POA: Diagnosis not present

## 2019-07-03 DIAGNOSIS — I4891 Unspecified atrial fibrillation: Secondary | ICD-10-CM | POA: Diagnosis not present

## 2019-07-03 DIAGNOSIS — N183 Chronic kidney disease, stage 3 unspecified: Secondary | ICD-10-CM | POA: Diagnosis not present

## 2019-07-20 DIAGNOSIS — R5381 Other malaise: Secondary | ICD-10-CM | POA: Diagnosis not present

## 2019-07-20 DIAGNOSIS — R0981 Nasal congestion: Secondary | ICD-10-CM | POA: Diagnosis not present

## 2019-07-20 DIAGNOSIS — R634 Abnormal weight loss: Secondary | ICD-10-CM | POA: Diagnosis not present

## 2019-07-20 DIAGNOSIS — R531 Weakness: Secondary | ICD-10-CM | POA: Diagnosis not present

## 2019-07-29 ENCOUNTER — Ambulatory Visit: Payer: Medicare Other

## 2019-08-04 ENCOUNTER — Ambulatory Visit: Payer: Medicare Other | Attending: Internal Medicine

## 2019-08-04 DIAGNOSIS — Z23 Encounter for immunization: Secondary | ICD-10-CM

## 2019-08-04 NOTE — Progress Notes (Signed)
   Covid-19 Vaccination Clinic  Name:  SEMAJA ERNO    MRN: [id number] DOB: 1940/09/11  08/04/2019  Ms. Decelles was observed post Covid-19 immunization for 15 minutes without incidence. She was provided with Vaccine Information Sheet and instruction to access the V-Safe system.   Ms. Cutino was instructed to call 911 with any severe reactions post vaccine: Marland Kitchen Difficulty breathing  . Swelling of your face and throat  . A fast heartbeat  . A bad rash all over your body  . Dizziness and weakness    Immunizations Administered    Name Date Dose VIS Date Route   Pfizer COVID-19 Vaccine 08/04/2019 12:16 PM 0.3 mL 06/09/2019 Intramuscular   Manufacturer: [place]   Lot: [id number]   [place]: [id number]

## 2019-08-29 ENCOUNTER — Ambulatory Visit: Payer: Medicare Other | Attending: Internal Medicine

## 2019-08-29 DIAGNOSIS — Z23 Encounter for immunization: Secondary | ICD-10-CM | POA: Insufficient documentation

## 2019-08-29 NOTE — Progress Notes (Signed)
   Covid-19 Vaccination Clinic  Name:  [name]    MRN: [id number] DOB: [birth year]  08/29/2019  [name] was observed post Covid-19 immunization for 15 minutes without incident. She was provided with Vaccine Information Sheet and instruction to access the V-Safe system.   [name] was instructed to call 911 with any severe reactions post vaccine: Marland Kitchen Difficulty breathing  . Swelling of face and throat  . A fast heartbeat  . A bad rash all over body  . Dizziness and weakness   Immunizations Administered    Name Date Dose VIS Date Route   Pfizer COVID-19 Vaccine 08/29/2019  1:11 PM 0.3 mL 06/09/2019 Intramuscular   Manufacturer: [place]   Lot: [id number]   [place]: [id number]

## 2019-09-06 DIAGNOSIS — N183 Chronic kidney disease, stage 3 unspecified: Secondary | ICD-10-CM | POA: Diagnosis not present

## 2019-09-06 DIAGNOSIS — E7800 Pure hypercholesterolemia, unspecified: Secondary | ICD-10-CM | POA: Diagnosis not present

## 2019-09-06 DIAGNOSIS — I4891 Unspecified atrial fibrillation: Secondary | ICD-10-CM | POA: Diagnosis not present

## 2019-10-09 DIAGNOSIS — E782 Mixed hyperlipidemia: Secondary | ICD-10-CM | POA: Diagnosis not present

## 2019-10-11 ENCOUNTER — Other Ambulatory Visit: Payer: Self-pay

## 2019-10-11 ENCOUNTER — Encounter: Payer: Medicare Other | Attending: Psychology | Admitting: Psychology

## 2019-10-11 ENCOUNTER — Encounter: Payer: Self-pay | Admitting: Psychology

## 2019-10-11 DIAGNOSIS — R413 Other amnesia: Secondary | ICD-10-CM | POA: Insufficient documentation

## 2019-10-11 DIAGNOSIS — R4189 Other symptoms and signs involving cognitive functions and awareness: Secondary | ICD-10-CM | POA: Diagnosis not present

## 2019-10-11 NOTE — Progress Notes (Signed)
Neuropsychological Consultation   Patient:   [name]   DOB:   1940-11-20  MR Number:  [id number]  Location:  [facility] [place], [place] [id number] [place] [address] Dept: [phone]           Date of Service:   10/11/2019  Start Time:   10 AM End Time:   12 PM  Today's visit was an in person visit that was conducted in my outpatient clinic office.  The patient, her husband and myself were present for this appointment.  1 hour and 50 minutes were spent in face-to-face interaction and the other 45 minutes was spent with records review and report writing.  Provider/Observer:  Ilean Skill, Psy.D.       Clinical Neuropsychologist       Billing Code/Service: 96116/96121  Chief Complaint:    [name] is an 79 year old female referred by Sarina Ill, MD for neuropsychological evaluation due to complaints of forgetfulness and memory loss but the patient feels like is progressing.  She was initially referred to [facility] and Dr. Jaynee Eagles by her PCP Lona Kettle, MD.  The patient has a medical history including chronic pancreatitis, A. fib, arthritis.  The patient reports that she has been feeling more forgetful and has been having trouble with names of objects and people's names particularly during conversation.  The patient reports that she usually does come up with the name later but that this forgetfulness and difficulty is embarrassing to her when it happens and raises concern about what the implications could be.  The patient reports that her mother was diagnosed with dementia around the mother's retirement age with her mother passing away at 28 with significant dementia by the time she passed away.  The patient is unsure whether this was ever formally diagnosed as Alzheimer's or other conditions.  The patient denies any geographic  disorientation, denies any tremor or visual hallucinations.    Reason for Service:  [name] is an 79 year old female referred by Sarina Ill, MD for neuropsychological evaluation due to complaints of forgetfulness and memory loss but the patient feels like is progressing.  She was initially referred to [facility] and Dr. Jaynee Eagles by her PCP Lona Kettle, MD.  The patient has a medical history including chronic pancreatitis, A. fib, arthritis.  The patient reports that she has been feeling more forgetful and has been having trouble with names of objects and people's names particularly during conversation.  The patient reports that she usually does come up with the name later but that this forgetfulness and difficulty is embarrassing to her when it happens and raises concern about what the implications could be.  The patient reports that her mother was diagnosed with dementia around the mother's retirement age with her mother passing away at 66 with significant dementia by the time she passed away.  The patient is unsure whether this was ever formally diagnosed as Alzheimer's or other conditions.  The patient denies any geographic disorientation, denies any tremor or visual hallucinations.  The patient reports that since the pandemic that she has been more isolated and seen others much less frequently.  She does acknowledge some increasing symptoms of depressive symptomatology and reports that she feels very fatigued and tired now and ultimately ends up taking multiple naps each day.  The patient does have a history of prior possible diagnosis of sleep apnea but  denies snoring but acknowledges fatigue during the day and she naps multiple times during the day.  She was told to adapt to the obstructive sleep apnea with using pillows to elevate her head and she was also prescribed a CPAP machine in the past but is not using 1.  No other neurological deficits or other symptoms are  noted.  The patient denies any other history of illness or injury.  She reports that generally she is doing very well medically.  The patient's husband reports that the patient will play solitaire for most of the day on the computer when she is not taking naps.  She had been more physically active but that is really decreased more recently.  The patient was scheduled for an MRI but it has not been completed as of yet.  There was a mixup on the time for her MRI and she showed up at a time different than her appointment and it has not been rescheduled as of yet.  I did encourage the patient to contact the MRI facility to get that rescheduled as it could be a very important piece of medical information for our evaluation.  Behavioral Observation: [name]  presents as a 79 y.o.-year-old Right Caucasian Female who appeared her stated age. her dress was Appropriate and she was Well Groomed and her manners were Appropriate to the situation.  her participation was indicative of Appropriate and Attentive behaviors.  There were not any physical disabilities noted.  she displayed an appropriate level of cooperation and motivation.     Interactions:    Active Appropriate and Attentive  Attention:   within normal limits and attention span and concentration were age appropriate  Memory:   within normal limits; recent and remote memory intact  Visuo-spatial:  not examined  Speech (Volume):  normal  Speech:   normal; only a couple of occasions of word finding issues or naming hesitancy  Thought Process:  Coherent and Relevant  Though Content:  WNL; not suicidal and not homicidal  Orientation:   person, place, time/date and situation  Judgment:   Good  Planning:   Good  Affect:    Appropriate  Mood:    Dysphoric  Insight:   Good  Intelligence:   normal  Marital Status/Living: The patient was born and raised in [place] with 5 siblings.  No complications at birth or pregnancy were noted  with her mother.  Developmental milestones were reached at the appropriate time.  She did have early medical issues related to scarlet fever, measles, and chickenpox.  The patient has been married for 60 years and continues to live with her husband who is in good health.  They have 2 adult daughters age 48 and 39 years old with no significant medical issues or psychiatric issues.  Current Employment: The patient is retired from other work.  She does continue to take care of her home and all of the activities that that entails.  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  Substance Use:  No concerns of substance abuse are reported.    Education:   The patient completed high school and attended the [employer] for 2 years maintaining an AB GPA.  Medical History:   Past Medical History:  Diagnosis Date  . Arthritis    right shoulder replacement, right replacement   . Atrial fibrillation ([place])   . Chronic pancreatitis ([place])   . Syncope 03/2011  Psychiatric History:  No prior psychiatric history although the patient does reports that she has been more isolated and may be having some more depressive symptoms along with her fatigue and other symptoms more recently.  Family Med/Psych History:  Family History  Problem Relation Age of Onset  . Dementia Mother   . Memory loss Brother    Impression/DX:  [name] is an 79 year old female referred by Sarina Ill, MD for neuropsychological evaluation due to complaints of forgetfulness and memory loss but the patient feels like is progressing.  She was initially referred to [facility] and Dr. Jaynee Eagles by her PCP Lona Kettle, MD.  The patient has a medical history including chronic pancreatitis, A. fib, arthritis.  The patient reports that she has been feeling more forgetful and has been having trouble with names of objects and  people's names particularly during conversation.  The patient reports that she usually does come up with the name later but that this forgetfulness and difficulty is embarrassing to her when it happens and raises concern about what the implications could be.  The patient reports that her mother was diagnosed with dementia around the mother's retirement age with her mother passing away at 108 with significant dementia by the time she passed away.  The patient is unsure whether this was ever formally diagnosed as Alzheimer's or other conditions.  The patient denies any geographic disorientation, denies any tremor or visual hallucinations.   Disposition/Plan:  We have set the patient up for formal neuropsychological testing.  She will be administered the Wechsler Adult Intelligence Scale in order to provide a broad range of cognitive domain assessment in a well structured and well normed format as well as the Wechsler Memory Scale for older adults.  There may be other measures added to this depending on how she does on these various measures.  Diagnosis:    Memory loss  Cognitive decline         Electronically Signed   _______________________ Ilean Skill, Psy.D.

## 2019-10-18 ENCOUNTER — Ambulatory Visit
Admission: RE | Admit: 2019-10-18 | Discharge: 2019-10-18 | Disposition: A | Discharge status: Home / Self Care | Payer: Medicare Other | Source: Ambulatory Visit | Attending: Neurology | Admitting: Neurology

## 2019-10-18 DIAGNOSIS — G9340 Encephalopathy, unspecified: Secondary | ICD-10-CM | POA: Diagnosis not present

## 2019-10-18 DIAGNOSIS — R4189 Other symptoms and signs involving cognitive functions and awareness: Secondary | ICD-10-CM

## 2019-10-18 DIAGNOSIS — R413 Other amnesia: Secondary | ICD-10-CM

## 2019-10-18 MED ORDER — GADOBENATE DIMEGLUMINE 529 MG/ML IV SOLN
14.0000 mL | Freq: Once | INTRAVENOUS | Status: AC | PRN
  Administered 2019-10-18: 14 mL via INTRAVENOUS

## 2019-10-19 ENCOUNTER — Telehealth: Payer: Self-pay | Admitting: *Deleted

## 2019-10-19 NOTE — Telephone Encounter (Signed)
-----   Message from Melvenia Beam, MD sent at 10/19/2019  5:21 PM EDT ----- MRI of the brain normal for age, thanks

## 2019-10-19 NOTE — Telephone Encounter (Signed)
Spoke with pt. We discussed her MRI results. She understands there were age-appropriate findings of chronic small vessel disease and cerebral atrophy. Her questions were answered. Pt verbalized appreciation for the call.

## 2019-11-03 ENCOUNTER — Encounter: Payer: Self-pay | Admitting: Psychology

## 2019-11-03 ENCOUNTER — Encounter: Payer: Medicare Other | Attending: Psychology | Admitting: Psychology

## 2019-11-03 ENCOUNTER — Other Ambulatory Visit: Payer: Self-pay

## 2019-11-03 DIAGNOSIS — R413 Other amnesia: Secondary | ICD-10-CM | POA: Diagnosis not present

## 2019-11-03 DIAGNOSIS — I4891 Unspecified atrial fibrillation: Secondary | ICD-10-CM | POA: Insufficient documentation

## 2019-11-03 DIAGNOSIS — M1990 Unspecified osteoarthritis, unspecified site: Secondary | ICD-10-CM | POA: Diagnosis not present

## 2019-11-03 DIAGNOSIS — G4733 Obstructive sleep apnea (adult) (pediatric): Secondary | ICD-10-CM | POA: Diagnosis not present

## 2019-11-03 DIAGNOSIS — R4189 Other symptoms and signs involving cognitive functions and awareness: Secondary | ICD-10-CM | POA: Insufficient documentation

## 2019-11-06 NOTE — Progress Notes (Addendum)
Results:  Composite Score Summary  Scale Sum of Scaled Scores Composite Score Percentile Rank 95% Conf. Interval Qualitative Description  Verbal Comprehension 40 VCI 118 88 112-123 High Average  Perceptual Reasoning 48 PRI 135 99 127-139 Very Superior  Working Memory 23 WMI 108 70 101-114 Average  Processing Speed 19 PSI 97 42 89-106 Average  Full Scale 130 FSIQ 121 92 117-125 Superior  General Ability 88 GAI 131 98 125-135 Very Superior   LANGUAGE   COWAT  FAS Total= 47, T=57, 75th %  Animal Naming Total=17, T=51, 54th %  BNT   Total=53/60 T-51, 54th %  Verbal Comprehension Subtests Summary  Subtest Raw Score Scaled Score Percentile Rank Reference Group Scaled Score SEM  Similarities 27 13 84 11 0.90  Vocabulary 50 14 91 15 0.73  Information 19 13 84 14 0.73   Perceptual Reasoning Subtests Summary  Subtest Raw Score Scaled Score Percentile Rank Reference Group Scaled Score SEM  Block Design 42 15 95 9 1.34  Matrix Reasoning 15 14 91 8 1.12  Visual Puzzles 19 19 99.9 12 1.27    Working Doctor, general practice Raw Score Scaled Score Percentile Rank Reference Group Scaled Score SEM  Digit Span 23 10 50 7 0.85  Arithmetic 15 13 84 11 0.99   Processing Speed Subtests Summary  Subtest Raw Score Scaled Score Percentile Rank Reference Group Scaled Score SEM  Symbol Search 11 7 16 2  1.12  Coding 52 12 75 6 1.12   WMS-IV OLDER ADULT BATTERY  Index Score Summary  Index Sum of Scaled Scores Index Score Percentile Rank 95% Confidence Interval Qualitative Descriptor  Auditory Memory (AMI) 49 113 81 106-119 High Average  Visual Memory (VMI) 15 87 19 82-92 Low Average  Immediate Memory (IMI) 35 110 75 103-116 High Average  Delayed Memory (DMI) 29 98 45 91-106 Average   Primary Subtest Scaled Score Summary  Subtest Domain Raw Score Scaled Score Percentile Rank  Logical Memory I AM 41 15 95  Logical Memory II AM 22 13 84  Verbal Paired Associates I AM 20 11  63  Verbal [facility] II AM 5 10 50  Visual Reproduction I VM 22 9 37  Visual Reproduction II VM 4 6 9   Symbol Span VWM 21 14 91   PROCESS SCORE CONVERSIONS  Auditory Memory Process Score Summary  Process Score Raw Score Scaled Score Percentile Rank Cumulative Percentage (Base Rate)  LM II Recognition 20 - - >75%  VPA II Recognition 28 - - 51-75%   Visual Memory Process Score Summary  Process Score Raw Score Scaled Score Percentile Rank Cumulative Percentage (Base Rate)  VR II Recognition 5 - - >75%    ABILITY-MEMORY ANALYSIS  Ability Score:  VCI: 118 Date of Testing:  WAIS-IV; WMS-IV 2019/11/03  Predicted Difference Method   Index Predicted WMS-IV Index Score Actual WMS-IV Index Score Difference Critical Value  Significant Difference Y/N Base Rate  Auditory Memory 109 113 -4 9.14 N   Visual Memory 108 87 21 7.30 Y 5-10%  Immediate Memory 110 110 0 10.12 N   Delayed Memory 109 98 11 10.62 Y 20-25%  Statistical significance (critical value) at the .01 level.   Contrast Scaled Scores  Score Score 1 Score 2 Contrast Scaled Score  General Ability Index vs. Auditory Memory Index 131 113 10  General Ability Index vs. Visual Memory Index 131 87 3  General Ability Index vs. Immediate Memory Index 131 110 9  General Ability Index  vs. Delayed Memory Index 131 98 6  Verbal Comprehension Index vs. Auditory Memory Index 118 113 11  Perceptual Reasoning Index vs. Visual Memory Index 135 87 1  Working Memory Index vs. Auditory Memory Ind [id number]

## 2019-11-09 ENCOUNTER — Encounter (HOSPITAL_BASED_OUTPATIENT_CLINIC_OR_DEPARTMENT_OTHER): Payer: Medicare Other | Admitting: Psychology

## 2019-11-09 ENCOUNTER — Other Ambulatory Visit: Payer: Self-pay

## 2019-11-09 DIAGNOSIS — G4733 Obstructive sleep apnea (adult) (pediatric): Secondary | ICD-10-CM | POA: Diagnosis not present

## 2019-11-09 DIAGNOSIS — R413 Other amnesia: Secondary | ICD-10-CM | POA: Diagnosis not present

## 2019-11-09 DIAGNOSIS — M1990 Unspecified osteoarthritis, unspecified site: Secondary | ICD-10-CM | POA: Diagnosis not present

## 2019-11-09 DIAGNOSIS — R4189 Other symptoms and signs involving cognitive functions and awareness: Secondary | ICD-10-CM | POA: Diagnosis not present

## 2019-11-09 DIAGNOSIS — F0150 Vascular dementia without behavioral disturbance: Secondary | ICD-10-CM | POA: Diagnosis not present

## 2019-11-09 DIAGNOSIS — G3184 Mild cognitive impairment, so stated: Secondary | ICD-10-CM

## 2019-11-09 DIAGNOSIS — I4891 Unspecified atrial fibrillation: Secondary | ICD-10-CM | POA: Diagnosis not present

## 2019-11-11 ENCOUNTER — Other Ambulatory Visit: Payer: Medicare Other

## 2019-11-13 ENCOUNTER — Encounter: Payer: Self-pay | Admitting: Psychology

## 2019-11-13 NOTE — Progress Notes (Addendum)
Neuropsychological Evaluation   Patient:                       [name]          DOB:                           09-20-1940  MR Number:               [id number]  Location:                    [facility] [place], [place] [id number] [place] [address] Dept: [phone]                                                                                                              Date of Service:                     11/09/2019  Start Time:                             8 AM End Time:                               9 PM   Provider/Observer:               Ilean Skill, Psy.D.                                                   Clinical Neuropsychologist                                                   Chief Complaint:                   Subjective reports of forgetfulness and memory loss with progressive worsening   Reason for Service:              Treasa Helgesen. Argentieri is an 79 year old female referred by Sarina Ill, MD for neuropsychological evaluation due to complaints of forgetfulness and memory loss but the patient feels like is progressing.  She was initially referred to [facility] and Dr. Jaynee Eagles by her PCP Lona Kettle, MD.  The patient has a medical history including chronic pancreatitis, A. fib, arthritis.  The patient reports that she has been feeling more forgetful and has been having trouble with names of objects and people's names particularly during conversation.  The patient reports that she usually does come up with  the name later but that this forgetfulness and difficulty is embarrassing to her when it happens and raises concern about what the implications could be.  The patient reports that her mother was diagnosed with dementia around the mother's retirement age with her mother passing away at 89 with significant dementia by the time she  passed away.  The patient is unsure whether this was ever formally diagnosed as Alzheimer's or other conditions.  The patient denies any geographic disorientation, denies any tremor or visual hallucinations.  The patient reports that since the pandemic that she has been more isolated and seen others much less frequently.  She does acknowledge some increasing symptoms of depressive symptomatology and reports that she feels very fatigued and tired now and ultimately ends up taking multiple naps each day.  The patient does have a history of prior possible diagnosis of sleep apnea but denies snoring but acknowledges fatigue during the day and she naps multiple times during the day.  She was told to adapt to the obstructive sleep apnea with using pillows to elevate her head and she was also prescribed a CPAP machine in the past but is not using 1.  No other neurological deficits or other symptoms are noted.  The patient denies any other history of illness or injury.  She reports that generally she is doing very well medically.  The patient's husband reports that the patient will play solitaire for most of the day on the computer when she is not taking naps.  She had been more physically active but that is really decreased more recently.  The patient was scheduled for an MRI but it has not been completed as of yet.  There was a mixup on the time for her MRI and she showed up at a time different than her appointment and it has not been rescheduled as of yet.  I did encourage the patient to contact the MRI facility to get that rescheduled as it could be a very important piece of medical information for our evaluation.  TESTS ADMINISTERED   Animal Naming Test  [employer] Test (BNT)  Controlled Word Association Test (COWAT)  Wechsler Adult Intelligence Scale, 4th Edition (WAIS-IV)  Wechsler Memory Scale, 4th Edition, Older Adult Battery (WMS-IV-OA)  BEHAVIORAL OBSERVATIONS DURING TESTING The  patient was cooperative with all tasks presented to her. She was able to adequately comprehend directions for all of the tasks. She completed all tests administered to her. She exhibited adequate frustration tolerance on questions she did not know or tasks that were more difficult. She demonstrated mild to moderate anxiety throughout testing and worried about her performance. She lost her train of thought a few times during intake and testing and evidenced slight word finding difficulties. Overall, she was cooperative with the evaluation and appeared to put forth her best effort. She did appear to get tired after being administered the WAIS-IV and complained of some fatigue during administration of the [facility] battery. Therefore, these results are considered mostly valid but may slightly underestimate her current learning and memory ability due possible influence from cognitive fatigue.   RESULTS    Given her reported educational and occupational history, the patient's premorbid intellectual ability was estimated as average. It is in that context that the following neuropsychological test results were interpreted.  On testing, The patient showed overall intact level of cognitive functioning and scored in the average to very superior range on tests measuring aspects of language  (i.e., naming and fluency) and aspects of  language involving verbal comprehension, perceptual reasoning, working memory, and processing speed. More specifically, assessed visuospatial/visuoconstructional abilities were grossly intact and ranged from superior to very superior. Verbal comprehension was high average overall. Basic auditory attention was somewhat weak compared to complex attention as well as visual attention, which were found intact. Working Marine scientist for auditory information was average overall, while her visual attention/encoding was high average. Processing speed was also somewhat variable but largely intact, ranging from  low average to average.    Learning (i.e., encoding) of novel verbal information was intact. Delayed memory was more variable, but mostly strong. She appeared to benefit from verbal information being presented in a structured manner (i.e., stories), rather than an unstructured manner (i.e., seemingly unrelated list of words). Learning of novel visual information was average while retrieval was low average, with evidence for appropriate recognition of information.  Composite Score Summary  Scale Sum of Scaled Scores Composite Score Percentile Rank 95% Conf. Interval Qualitative Description  Verbal Comprehension 40 VCI 118 88 112-123 High Average  Perceptual Reasoning 48 PRI 135 99 127-139 Very Superior  Working Memory 23 WMI 108 70 101-114 Average  Processing Speed 19 PSI 97 42 89-106 Average  Full Scale 130 FSIQ 121 92 117-125 Superior  General Ability 88 GAI 131 98 125-135 Very Superior   The patient produced a verbal comprehension index score of 118 which falls at the 88th percentile and is in the high average range.  There was no variability in subtest scores.  The patient did very well and performed in the high average range on measures of verbal reasoning problem-solving, vocabulary knowledge, and general fund of information.   Verbal Comprehension Subtests Summary  Subtest Raw Score Scaled Score Percentile Rank Reference Group Scaled Score SEM  Similarities 27 13 84 11 0.90  Vocabulary 50 14 91 15 0.73  Information 19 13 84 14 0.73    The patient produced a perceptual reasoning index score of 135 which falls at the 99th percentile and is in the very superior range.  Mild variability was noted as she performed extremely well on a measure of visual spatial analysis and integration.    Clock Drawing Test  . 10/10; WNL  We also administered the patient the clock drawing test which assesses visual constructional abilities and the patient also did quite well on this measure showing no  indication of visual constructional deficits.  Perceptual Reasoning Subtests Summary  Subtest Raw Score Scaled Score Percentile Rank Reference Group Scaled Score SEM  Block Design 42 15 95 9 1.34  Matrix Reasoning 15 14 91 8 1.12  Visual Puzzles 19 19 99.9 12 1.27    The patient produced a working memory index score of 108 which falls at the [employer] and in the average range.  The patient did well on measures of auditory encoding and multi-processing variables. Her basic arithmetic skills are high average and relatively strong compared to her basic auditory attention, which placed in the average range for her age.   Working Doctor, general practice Raw Score Scaled Score Percentile Rank Reference Group Scaled Score SEM  Digit Span 23 10 50 7 0.85  Arithmetic 15 13 84 11 0.99   The patient produced a processing speed index score of 97 which falls at the 42nd percentile and in the average range.  There is no variability within subtest performance and the patient performed in the average range on measures assessing visual scanning, visual searching and overall speed of mental operations.  Processing Speed Subtests Summary  Subtest Raw Score Scaled Score Percentile Rank Reference Group Scaled Score SEM  Symbol Search 11 7 16 2  1.12  Coding 52 12 75 6 1.12   The patient produced a processing speed index score of 97, which falls in the 42nd percentile and is in the average range for her age. There is significant variability within subtest performance between measures assessing visual scanning, visual searching and overall speed of mental operations. She made a number of errors on a visual scanning and discrimination task  (Symbol Search: 16th percentile) that measures her ability to rapidly process visual information and make a rapid decisions. She performed in the high end of the average range on another test of visual processing speed and visual encoding (Coding: 75th percentile)    The patient was then administered the Wechsler Memory Scale, 4th edition, for Older Adults.  She performed in the average range (91st percentile) on visual working memory and encoding task, which is slightly greater than expected when compared to measures of auditory encoding from the Wechsler Adult Intelligence Scale that placed her abilities in the average to high average range.    Index Score Summary  Index Sum of Scaled Scores Index Score Percentile Rank 95% Confidence Interval Qualitative Descriptor  Auditory Memory (AMI) 49 113 81 106-119 High Average  Visual Memory (VMI) 15 87 19 82-92 Low Average  Immediate Memory (IMI) 35 110 75 103-116 High Average  Delayed Memory (DMI) 29 98 45 91-106 Average    Breaking the patient's memory functions, a significant difference was found between her auditory and visual memory. More specifically, while the patient produced an auditory memory index score of 113 which falls at the 81st percentile and in the average range, she performed low average overall on a measure of visual memory and achieved a visual memory index score of 87, which falls at the 19th percentile. The discrepancy between her auditory and visual memory is inconsistent with her overall cognitive abilities and may reflect either genuine memory changes and/or impacted by poor initial encoding/inattention, fatigue, problems with retrieval, slowed processing speed, and other factors.    We also compared the patient's immediate versus delayed memory function. The patient produced an immediate memory index score of 110 which falls at the 75th percentile and in the average range.  The patient did relatively worse on her delayed memory index where she produced an index score of 98 which falls at the 45th percentile and in the average range. The patient performed average overall on both on free recall of previously learned information as well as cued and recognition formats.  Overall, the patient's  memory was generally intact for her age but she had trouble with aspects of visual memory. She also displayed a slight (non-statistically significant) difference between her immediate and delayed memory function. Recognition memory for both auditory and visual material was intact and ranged from average (51st to 75th percentile) to high average (>75th percentile).   Primary Subtest Scaled Score Summary  Subtest Domain Raw Score Scaled Score Percentile Rank  Logical Memory I AM 41 15 95  Logical Memory II AM 22 13 84  Verbal Paired Associates I AM 20 11 63  Verbal Paired Associates II AM 5 10 50  Visual Reproduction I VM 22 9 37  Visual Reproduction II VM 4 6 9   Symbol Span VWM 21 14 91   Auditory Memory Process Score Summary  Process Score Raw Score Scaled Score Percentile Rank Cumulative Percentage [facility]  Rate)  LM II Recognition 20 - - >75%  VPA II Recognition 28 - - 51-75%   Visual Memory Process Score Summary  Process Score Raw Score Scaled Score Percentile Rank Cumulative Percentage (Base Rate)  VR II Recognition 5 - - >75%   To further analyze the patient's memory functions, we utilize the abilities-memory analysis.  This analysis takes the patient's verbal comprehension abilities index score from the Wechsler Adult Intelligence Scale (VCI: 118) and produces a predicted score based off that measure of where the patient should be performing on various memory indices.  This predictive scores then compared against her actual achieved score on these measures. Both her visual and delayed memory index scored were lower than predicted scores based on her verbal comprehension index score.  There was some indication of a relative weakness in visual and delayed memory relative to premorbid estimations and historical functioning or predicted levels based on longstanding "hold test" from her WAIS-IV performance.   Simple Difference Method   Index VCI WMS-IV Index Score Difference Critical Value   Significant Difference Y/N Base Rate  Auditory Memory 118 113 5 10.95 N   Visual Memory 118 87 31 9.48 Y 3%  Immediate Memory 118 110 8 11.61 N   Delayed Memory 118 98 20 12.24 Y 10%  Statistical significance (critical value) at the .01 level.    LANGUAGE   BNT  Total= 53/60, T=51, 54th percentile   COWAT   FAS Total=47, T=57, 75th percentile  Animals Total=17, T=51, 54th percentile   SUMMARY OF RESULTS:  The patient's current cognitive functioning was slightly variable but superior overall. She displayed exceptionally strong perceptual reasoning and verbal comprehension skills. Working Marine scientist and information processing speed were relatively weaker but average overall. She made a number of careless errors on a visual scanning and search task (symbol span) that significantly impacted her score. Confrontational naming and verbal fluency (lexical and semantic) were also average compared to other elderly females (early 84's) with similar levels of education.    Delayed memory, specifically free recall of visual information, was relatively weak and below expectation. However, she did not lose information that she had encoded during learning trials and had no trouble with recognition.    IMPRESSIONS/DIAGNOSIS:   Results do show some mild relative weakness in aspects of auditory encoding, processing speed and visual memory compared to her general cognitive abilities. Her preserved performance on verbal memory and visual recognition trials are not generally consistent with performance associated with Alzheimer's disease, but is commonly associated more subcortical deficits (e.g., small vessel ischemic disease). The patient does have some vascular risk factors such as previous history of smoking cigarettes (2.5 packs p/d; unknown period of time), atrial fibrillation, and chronic small vessel disease that may be contributing to her relative weakness. Her recent MRI did show age-appropriate  changes of chronic small vessel disease and generalized cerebral atrophy; no enhancing lesions were noted. This does seem to be mildly impacting her test scores to some degree and remains a risk for cognitive difficulty in the future, especially if it progresses. Her neurocognitive profile is not concerning for the presence of an underlying neurodegenerative disease process such as Alzheimer's disease, especially given overall intact retention, retrieval of verbal information, and recognition for previously learned information. However, due to the presence of cardiovascular risk factors including atrial fibrillation and chronic small vessel disease, it is important to continue monitoring patient's cardiovascular health and for patient to engage in more healthy lifestyle behaviors. Thus, given her  findings fro m neuropsychological testing, medical history, and information obtained from clinical interview (no decline in ADLs/IADLSs reported by either the patient or husband), her neuropsychological profile meets criteria for Mild Cognitive Impairment, so stated (G31.84), at this time.    Positive prognostic factors include preserved overall cognitive ability, active participation in leisure activities, family support, ambulate without assistance, and independently perform all daily activities.  The patient has several areas of strength and preserved functioning, making her a good candidate to utilize cognitive and behavioral strategies in order to address her areas of weakness. Positive prognostic factors include good cognitive reserve and functioning with superior reasoning, intact adaptive functioning, good social support, and effective interpersonal skills.   PATIENT RECOMMENDATIONS:   1. The patient should continue to work with Dr.Ross and other members of her medical team to monitor and control her medical conditions, including some cardio-vascular risk factors.  2. The patient would likely benefit from  participating in regular cognitive activity and regular physical exercise to improve blood and oxygen to the brain for example by engaging in aerobic exercises recommended by her treating physicians. Empirical evidence suggests that this could help reduce the risk of a more rapid progression of cognitive symptoms. Continuous activity and responsibility can also provide a sense of purpose in life, facilitate maintenance of independence, and improve overall quality of life. The tasks should take into consideration any physical strengths and weakness. Further, she should follow a healthy diet and continue to avoid alcohol and tobacco consumption.   3. External memory aids (i.e., checklist of daily tasks, lists identifying location of household items, calendars, reminder notes, etc.) can also help free up cognitive resources and enhance recall.  4. The patient is encouraged to designate a container or surface such as a shelf or table where she can place her keys, wallet, and other objects that she may loses track of, and to practice getting in the habit of always returning these objects to their designated area after use so that she always knows where to find them.  5. The current evaluation will serve as a baseline to compare with future assessments as needed. She is scheduled to return for repeat testing in 9 months.

## 2019-11-13 NOTE — Progress Notes (Signed)
Neuropsychological Evaluation   Patient:                       [name]          DOB:                           [birth year]  MR Number:               [id number]  Location:                    [facility] [place], [place] [id number] [place] [address] Dept: [phone]                                                                                                              Date of Service:                     11/09/2019  Start Time:                             8 AM End Time:                               9 AM  Provider/Observer:               Ilean Skill, Psy.D.                                                   Clinical Neuropsychologist                                                    Chief Complaint:                    Subjective reports of progressively worsening forgetfulness and memory loss.     Reason for Service:              Valenda Mcleary. Partridge is an 79 year old female referred by Sarina Ill, MD for neuropsychological evaluation due to complaints of forgetfulness and memory loss that the patient feels is progressing. She was initially referred to [facility] and Dr. Jaynee Eagles by her PCP Lona Kettle, MD.  The patient has a medical history including chronic pancreatitis, A. fib, arthritis.  The patient reports that she has been feeling more forgetful and has been having trouble with names of objects and people's names particularly during conversation.  The patient reports that she usually does come up with  the name later but that this forgetfulness and difficulty is embarrassing to her when it happens and raises concern about what the implications could be.  The patient reports that her mother was diagnosed with dementia around the mother's retirement age with her mother passing away at 105 with significant dementia by the time she passed  away.  The patient is unsure whether this was ever formally diagnosed as Alzheimer's or other conditions.  The patient denies any geographic disorientation, denies any tremor or visual hallucinations.  The patient reports that since the pandemic that she has been more isolated and seen others much less frequently.  She does acknowledge some increasing symptoms of depressive symptomatology and reports that she feels very fatigued and tired now and ultimately ends up taking multiple naps each day.  The patient does have a history of prior possible diagnosis of sleep apnea but denies snoring but acknowledges fatigue during the day and she naps multiple times during the day.  She was told to adapt to the obstructive sleep apnea with using pillows to elevate her head and she was also prescribed a CPAP machine in the past but is not using 1.  No other neurological deficits or other symptoms are noted.  The patient denies any other history of illness or injury.  She reports that generally she is doing very well medically.  The patient's husband reports that the patient will play solitaire for most of the day on the computer when she is not taking naps.  She had been more physically active but that is really decreased more recently.  The patient was scheduled for an MRI but it has not been completed as of yet.  There was a mixup on the time for her MRI and she showed up at a time different than her appointment and it has not been rescheduled as of yet.  I did encourage the patient to contact the MRI facility to get that rescheduled as it could be a very important piece of medical information for our evaluation.  Results:       Composite Score Summary  Scale Sum of Scaled Scores Composite Score Percentile Rank 95% Conf. Interval Qualitative Description  Verbal Comprehension 40 VCI 118 88 112-123 High Average  Perceptual Reasoning 48 PRI 135 99 127-139 Very Superior  Working Memory 23 WMI 108 70 101-114  Average  Processing Speed 19 PSI 97 42 89-106 Average  Full Scale 130 FSIQ 121 92 117-125 Superior  General Ability 88 GAI 131 98 125-135 Very Superior    Verbal Comprehension Subtests Summary  Subtest Raw Score Scaled Score Percentile Rank Reference Group Scaled Score SEM  Similarities 27 13 84 11 0.90  Vocabulary 50 14 91 15 0.73  Information 19 13 84 14 0.73    Perceptual Reasoning Subtests Summary  Subtest Raw Score Scaled Score Percentile Rank Reference Group Scaled Score SEM  Block Design 42 15 95 9 1.34  Matrix Reasoning 15 14 91 8 1.12  Visual Puzzles 19 19 99.9 12 1.27    Working Doctor, general practice Raw Score Scaled Score Percentile Rank Reference Group Scaled Score SEM  Digit Span 23 10 50 7 0.85  Arithmetic 15 13 84 11 0.99    Processing Speed Subtests Summary  Subtest Raw Score Scaled Score Percentile Rank Reference Group Scaled Score SEM  Symbol Search 11 7 16 2  1.12  Coding 52 12 75 6 1.12    Index Score Summary  Index Sum of Scaled Scores  Index Score Percentile Rank 95% Confidence Interval Qualitative Descriptor  Auditory Memory (AMI) 49 113 81 106-119 High Average  Visual Memory (VMI) 15 87 19 82-92 Low Average  Immediate Memory (IMI) 35 110 75 103-116 High Average  Delayed Memory (DMI) 29 98 45 91-106 Average       Auditory Memory Process Score Summary  Process Score Raw Score Scaled Score Percentile Rank Cumulative Percentage (Base Rate)  LM II Recognition 20 - - >75%  VPA II Recognition 28 - - 51-75%    Visual Memory Process Score Summary  Process Score Raw Score Scaled Score Percentile Rank Cumulative Percentage (Base Rate)  VR II Recognition 5 - - >75%    Simple Difference Method   Index VCI WMS-IV Index Score Difference Critical Value  Significant Difference Y/N Base Rate  Auditory Memory 118 113 5 10.95 N   Visual Memory 118 87 31 9.48 Y 3%  Immediate Memory 118 110 8 11.61 N   Delayed Memory 118 98 20 12.24 Y 10%   Statistical significance (critical value) at the .01 level.    LANGUAGE   COWAT  FAS Total= 47, T=57, 75th %   Animals Total=17, T=51, 54th %  BNT   Total=53/60 T-51, 54th %  Summary of Results:                        The results of the current neuropsychological evaluation show overall intact cognitive function with a relative weakness in verbal memory. She also displayed indications of mild decrease in working memory and processing speed relative to her general intellectual ability. However, these were still mostly consistent with predicted levels based on her education and occupational history. The patient's delayed memory was at expectation for highly contextualized information and somewhat weaker for rote material.  Her visual memory was significantly weaker than auditory memory but with preserved recognition. Aspects of language including confrontational naming and fluency are grossly intact.  Impression/Diagnosis:                     The results of the current neuropsychological evaluation are not consistent with any significant or progressive cortical or subcortical dementias.  While there is a signficiant difference between her visual and delayed memory function compared to her overall cognitive abilities, her auditory memory appears intact and she performed exceptionally well on recognition memory tasks for both visual and auditory information; this in inconsistent with an amnestic profile.  Her overall cognitive functioning is mostly consistent with estimations of premorbid functioning with notable strengths in visuoconstructional ability along with verbal and nonverbal abstract reasoning. Aspects of language (e.g., naming and fluency) appear intact at this time.

## 2019-11-14 ENCOUNTER — Encounter: Payer: Medicare Other | Admitting: Psychology

## 2019-11-17 ENCOUNTER — Encounter: Payer: Self-pay | Admitting: Psychology

## 2019-11-17 ENCOUNTER — Other Ambulatory Visit: Payer: Self-pay

## 2019-11-17 ENCOUNTER — Encounter (HOSPITAL_BASED_OUTPATIENT_CLINIC_OR_DEPARTMENT_OTHER): Payer: Medicare Other | Admitting: Psychology

## 2019-11-17 DIAGNOSIS — G3184 Mild cognitive impairment, so stated: Secondary | ICD-10-CM | POA: Diagnosis not present

## 2019-11-17 DIAGNOSIS — M1990 Unspecified osteoarthritis, unspecified site: Secondary | ICD-10-CM | POA: Diagnosis not present

## 2019-11-17 DIAGNOSIS — R4189 Other symptoms and signs involving cognitive functions and awareness: Secondary | ICD-10-CM | POA: Diagnosis not present

## 2019-11-17 DIAGNOSIS — I4891 Unspecified atrial fibrillation: Secondary | ICD-10-CM | POA: Diagnosis not present

## 2019-11-17 DIAGNOSIS — G4733 Obstructive sleep apnea (adult) (pediatric): Secondary | ICD-10-CM | POA: Diagnosis not present

## 2019-11-17 DIAGNOSIS — R413 Other amnesia: Secondary | ICD-10-CM | POA: Diagnosis not present

## 2019-11-17 NOTE — Progress Notes (Signed)
Today I provided feedback to the patient and her husband regarding the results of the recent neuropsychological evaluation.  Below you can find the summary and impressions/diagnoses from that formal evaluation.  The complete report can be found in the patient's medical records dated 11/09/2019 and the full history and initial intake information can be found dated 10/11/2019.  SUMMARY OF RESULTS:  The patient's current cognitive functioning was slightly variable but superior overall. She displayed exceptionally strong perceptual reasoning and verbal comprehension skills. Working Marine scientist and information processing speed were relatively weaker but average overall. She made a number of careless errors on a visual scanning and search task (symbol span) that significantly impacted her score. Confrontational naming and verbal fluency (lexical and semantic) were also average compared to other elderly females (early 55's) with similar levels of education.    Delayed memory, specifically free recall of visual information, was relatively weak and below expectation. However, she did not lose information that she had encoded during learning trials and had no trouble with recognition.    IMPRESSIONS/DIAGNOSIS:   Results do show some mild relative weakness in aspects of auditory encoding, processing speed and visual memory compared to her general cognitive abilities. Her preserved performance on verbal memory and visual recognition trials are not generally consistent with performance associated with Alzheimer's disease, but is commonly associated more subcortical deficits (e.g., small vessel ischemic disease). The patient does have some vascular risk factors such as previous history of smoking cigarettes (2.5 packs p/d; unknown period of time), atrial fibrillation, and chronic small vessel disease that may be contributing to her relative weakness. Her recent MRI did show age-appropriate changes of chronic small vessel  disease and generalized cerebral atrophy; no enhancing lesions were noted. This does seem to be mildly impacting her test scores to some degree and remains a risk for cognitive difficulty in the future, especially if it progresses. Her neurocognitive profile is not concerning for the presence of an underlying neurodegenerative disease process such as Alzheimer's disease, especially given overall intact retention, retrieval of verbal information, and recognition for previously learned information. However, due to the presence of cardiovascular risk factors including atrial fibrillation and chronic small vessel disease, it is important to continue monitoring patient's cardiovascular health and for patient to engage in more healthy lifestyle behaviors. Thus, given her findings fro m neuropsychological testing, medical history, and information obtained from clinical interview (no decline in ADLs/IADLSs reported by either the patient or husband), her neuropsychological profile meets criteria for Mild Cognitive Impairment, so stated (G31.84), at this time.   Positive prognostic factors include preserved overall cognitive ability, active participation in leisure activities, family support, ambulate without assistance, and independently perform all daily activities.The patient has several areas of strength and preserved functioning, making her a good candidate to utilize cognitive and behavioral strategies in order to address her areas of weakness. Positive prognostic factors include good cognitive reserve and functioning with superior reasoning, intact adaptive functioning, good social support, and effective interpersonal skills.   PATIENT RECOMMENDATIONS:   1. The patient should continue to work with Dr.Ross and other members of her medical team to monitor and control her medical conditions, including some cardio-vascular risk factors.  2. The patient would likely benefit from participating in regular  cognitive activity and regular physical exercise to improve blood and oxygen to the brain for example by engaging in aerobic exercises recommended by her treating physicians. Empirical evidence suggests that this could help reduce the risk of a more rapid progression of cognitive  symptoms. Continuous activity and responsibility can also provide a sense of purpose in life, facilitate maintenance of independence, and improve overall quality of life. The tasks should take into consideration any physical strengths and weakness. Further, she should follow a healthy diet and continue to avoid alcohol and tobacco consumption.   3. External memory aids (i.e., checklist of daily tasks, lists identifying location of household items, calendars, reminder notes, etc.) can also help free up cognitive resources and enhance recall.  4. The patient is encouraged to designate a container or surface such as a shelf or table where she can place her keys, wallet, and other objects that she may loses track of, and to practice getting in the habit of always returning these objects to their designated area after use so that she always knows where to find them.  5. The current evaluation will serve as a baseline to compare with future assessments as needed. She is scheduled to return for repeat testing in 9 months.

## 2019-11-21 ENCOUNTER — Telehealth: Payer: Self-pay

## 2019-11-21 NOTE — Telephone Encounter (Signed)
Patient husband [name] states needs 2 pages of text from [employer] by Zigmund Daniel - they will n ot print out. [phone]

## 2019-11-21 NOTE — Telephone Encounter (Signed)
Will respond to pt. accordingly

## 2019-12-13 DIAGNOSIS — Z8719 Personal history of other diseases of the digestive system: Secondary | ICD-10-CM | POA: Diagnosis not present

## 2019-12-13 DIAGNOSIS — R5381 Other malaise: Secondary | ICD-10-CM | POA: Diagnosis not present

## 2019-12-13 DIAGNOSIS — R140 Abdominal distension (gaseous): Secondary | ICD-10-CM | POA: Diagnosis not present

## 2019-12-13 DIAGNOSIS — E559 Vitamin D deficiency, unspecified: Secondary | ICD-10-CM | POA: Diagnosis not present

## 2019-12-13 DIAGNOSIS — M2550 Pain in unspecified joint: Secondary | ICD-10-CM | POA: Diagnosis not present

## 2019-12-28 ENCOUNTER — Ambulatory Visit: Payer: Medicare Other | Admitting: Psychology

## 2020-01-02 DIAGNOSIS — I4891 Unspecified atrial fibrillation: Secondary | ICD-10-CM | POA: Diagnosis not present

## 2020-01-02 DIAGNOSIS — E7800 Pure hypercholesterolemia, unspecified: Secondary | ICD-10-CM | POA: Diagnosis not present

## 2020-01-02 DIAGNOSIS — N183 Chronic kidney disease, stage 3 unspecified: Secondary | ICD-10-CM | POA: Diagnosis not present

## 2020-01-12 DIAGNOSIS — K649 Unspecified hemorrhoids: Secondary | ICD-10-CM | POA: Diagnosis not present

## 2020-01-12 DIAGNOSIS — M353 Polymyalgia rheumatica: Secondary | ICD-10-CM | POA: Diagnosis not present

## 2020-01-23 ENCOUNTER — Ambulatory Visit: Payer: Medicare Other | Admitting: Psychology

## 2020-03-11 DIAGNOSIS — I4891 Unspecified atrial fibrillation: Secondary | ICD-10-CM | POA: Diagnosis not present

## 2020-03-11 DIAGNOSIS — M353 Polymyalgia rheumatica: Secondary | ICD-10-CM | POA: Diagnosis not present

## 2020-03-11 DIAGNOSIS — N183 Chronic kidney disease, stage 3 unspecified: Secondary | ICD-10-CM | POA: Diagnosis not present

## 2020-03-11 DIAGNOSIS — E7800 Pure hypercholesterolemia, unspecified: Secondary | ICD-10-CM | POA: Diagnosis not present

## 2020-03-11 DIAGNOSIS — D649 Anemia, unspecified: Secondary | ICD-10-CM | POA: Diagnosis not present

## 2020-03-18 DIAGNOSIS — Z1231 Encounter for screening mammogram for malignant neoplasm of breast: Secondary | ICD-10-CM | POA: Diagnosis not present

## 2020-03-19 DIAGNOSIS — M353 Polymyalgia rheumatica: Secondary | ICD-10-CM | POA: Diagnosis not present

## 2020-03-19 DIAGNOSIS — Z23 Encounter for immunization: Secondary | ICD-10-CM | POA: Diagnosis not present

## 2020-03-19 DIAGNOSIS — K5900 Constipation, unspecified: Secondary | ICD-10-CM | POA: Diagnosis not present

## 2020-03-19 DIAGNOSIS — E611 Iron deficiency: Secondary | ICD-10-CM | POA: Diagnosis not present

## 2020-04-03 DIAGNOSIS — Z23 Encounter for immunization: Secondary | ICD-10-CM | POA: Diagnosis not present

## 2020-04-19 ENCOUNTER — Encounter: Payer: Self-pay | Admitting: Cardiology

## 2020-04-19 ENCOUNTER — Ambulatory Visit: Payer: Medicare Other | Admitting: Cardiology

## 2020-04-19 ENCOUNTER — Other Ambulatory Visit: Payer: Self-pay

## 2020-04-19 VITALS — BP 128/55 | HR 57 | Resp 16 | Ht 61.0 in | Wt 144.0 lb

## 2020-04-19 DIAGNOSIS — I351 Nonrheumatic aortic (valve) insufficiency: Secondary | ICD-10-CM | POA: Diagnosis not present

## 2020-04-19 DIAGNOSIS — E7800 Pure hypercholesterolemia, unspecified: Secondary | ICD-10-CM | POA: Diagnosis not present

## 2020-04-19 DIAGNOSIS — I480 Paroxysmal atrial fibrillation: Secondary | ICD-10-CM | POA: Diagnosis not present

## 2020-04-19 MED ORDER — ATENOLOL 25 MG PO TABS: 25.0000 mg | ORAL_TABLET | Freq: Every day | ORAL | 3 refills | Status: DC

## 2020-04-19 MED ORDER — PRAVASTATIN SODIUM 20 MG PO TABS: 20.0000 mg | ORAL_TABLET | Freq: Every day | ORAL | 3 refills | Status: DC

## 2020-04-19 NOTE — Progress Notes (Signed)
Labs 03/11/2020:  Primary Physician/Referring:  [name], MD  Patient ID: [name], female    DOB: 01-09-1941, 79 y.o.   MRN: [id number]  Chief Complaint  Patient presents with  . Atrial Fibrillation  . New Patient (Initial Visit)   HPI:    [name]  is a 79 y.o. Caucasian female with diagnosis of paroxysmal atrial fibrillation with documented EKG in 2014, who is presently on a beta-blocker and magnesium supplements with "no more issues since then", mild hyperlipidemia, GERD, chronic pancreatitis,  history of hemorrhoids with no recent GI bleed presents to establish with me, previously being followed by [facility] and is presently on aspirin alone as per her wish.  Recently due to generalized aches, fatigue and daytime somnolence, diagnosis of polymyalgia rheumatica was entertained and she is presently on steroids.  States that symptoms have improved.  Denies any palpitations, shortness of breath, PND or orthopnea.  Past Medical History:  Diagnosis Date  . Arthritis    right shoulder replacement, right replacement   . Atrial fibrillation ([place])   . Chronic pancreatitis ([place])   . Syncope 03/2011   Past Surgical History:  Procedure Laterality Date  . APPENDECTOMY  1955  . CHOLECYSTECTOMY  1998  . CYSTECTOMY  1981   [place]  . JOINT REPLACEMENT     right shoulder 2008; right hip 2016  . [place]  . TOTAL HIP ARTHROPLASTY Right 11/2014   [facility], [place], [place]   Family History  Problem Relation Age of Onset  . Dementia Mother   . Memory loss Brother   . Hodgkin's lymphoma Father   . Kidney failure Brother     Social History   Tobacco Use  . Smoking status: Former Smoker    Packs/day: 2.50    Types: Cigarettes    Quit date: 1980    Years since quitting: 41.8  . Smokeless tobacco: Never Used  Substance Use Topics  . Alcohol use: Not Currently   Marital Status: Married  ROS  Review of Systems  Constitutional:  Positive for malaise/fatigue.  Cardiovascular: Negative for chest pain, dyspnea on exertion and leg swelling.  Musculoskeletal: Positive for arthritis.  Gastrointestinal: Negative for melena.   Objective  Blood pressure (!) 128/55, pulse (!) 57, resp. rate 16, height 5' 1"  (1.549 m), weight 144 lb (65.3 kg), SpO2 97 %.  Vitals with BMI 04/19/2020 05/29/2019  Height 5' 1"  5' 1"   Weight 144 lbs 172 lbs  BMI 11.94 17.40  Systolic 814 481  Diastolic 55 62  Pulse 57 45     Physical Exam HENT:     Head: Atraumatic.  Cardiovascular:     Rate and Rhythm: Normal rate and regular rhythm.     Pulses: Normal pulses and intact distal pulses.     Heart sounds: S1 normal and S2 normal. Murmur heard.  Blowing midsystolic murmur is present with a grade of 2/6 radiating to the apex. High-pitched early diastolic murmur is present with a grade of 2/4 at the upper right sternal border radiating to the apex.  No gallop.      Comments: No edema. No JVD.  Pulmonary:     Effort: Pulmonary effort is normal.     Breath sounds: Normal breath sounds.  Abdominal:     General: Bowel sounds are normal.     Palpations: Abdomen is soft.    Laboratory examination:   Recent Labs    05/29/19 1143  NA 141  K 4.9  CL 104  CO2 24  GLUCOSE 95  BUN 29*  CREATININE 0.86  CALCIUM 9.1  GFRNONAA 64  GFRAA 74   CrCl cannot be calculated (Patient's most recent lab result is older than the maximum 21 days allowed.).  CMP Latest Ref Rng & Units 05/29/2019 11/17/2010 11/17/2010  Glucose 65 - 99 mg/dL 95 113(H) 107(H)  BUN 8 - 27 mg/dL 29(H) 24(H) 21  Creatinine 0.57 - 1.00 mg/dL 0.86 1.00 0.81  Sodium 134 - 144 mmol/L 141 143 140  Potassium 3.5 - 5.2 mmol/L 4.9 3.9 3.7  Chloride 96 - 106 mmol/L 104 109 105  CO2 20 - 29 mmol/L 24 - 25  Calcium 8.7 - 10.3 mg/dL 9.1 - 9.5   CBC Latest Ref Rng & Units 11/17/2010 11/17/2010 10/27/2007  WBC 4.0 - 10.5 K/uL - 6.3 -  Hemoglobin 12.0 - 15.0 g/dL 15.3(H) 14.8 14.5    Hematocrit 36 - 46 % 45.0 42.2 41.7  Platelets 150 - 400 K/uL - 148(L) -    Lipid Panel No results for input(s): CHOL, TRIG, LDLCALC, VLDL, HDL, CHOLHDL, LDLDIRECT in the last 8760 hours.  HEMOGLOBIN A1C No results found for: HGBA1C, MPG TSH Recent Labs    05/29/19 1143  TSH 1.910   External labs:   Labs 03/11/2020:  Iron binding capacity normal at 187.  Serum iron 25 markedly reduced (50-212).  Iron saturation markedly reduced at 9 (20/55%).  Transferrin level normal.  Hb 12.8/HCT 37.2, platelets 279.  Normal indicis.  Sedimentation rate 30 mm/h.  Minimally elevated.  Serum glucose 96 mg, BUN 28, creatinine 0.83, EGFR 66,, CMP otherwise normal.  Lipase normal at 37, TSH normal at 1.83.  Labs 10/09/2019:  Total cholesterol 153, triglycerides 148, HDL 50, LDL 77.  Hemoglobin 12.800 g/d 03/11/2020  Creatinine, Serum 0.830 mg/ 12/13/2019 Potassium 5.000 mm 12/13/2019 ALT (SGPT) 9.000 U/L 12/13/2019  TSH 1.830 12/13/2019   Medications and allergies   Allergies  Allergen Reactions  . Cephalexin     dehydrated  . Hydrocodone-Guaifenesin     Dizzy   . Tape     Surgical tape  . Tramadol     constipation     Outpatient Medications Prior to Visit  Medication Sig Dispense Refill  . acetaminophen (TYLENOL) 500 MG chewable tablet Chew 500 mg by mouth. Takes up to 6 a day.    Marland Kitchen atenolol (TENORMIN) 25 MG tablet TAKE 1 TABLET BY MOUTH DAILY.    . B Complex-Biotin-FA (VITAMIN B50 COMPLEX PO) Take 0.5 tablets by mouth.    Marland Kitchen CALCIUM PO Take 1,000 mg by mouth as needed.     . Cholecalciferol (VITAMIN D3 PO) Take 2,000 Units by mouth daily.     . Coenzyme Q10 (CO Q 10 PO) Take 300 mg by mouth daily.     . magnesium oxide (MAG-OX) 400 MG tablet Take by mouth daily.     . Melatonin 1 MG CAPS Take 5 mg by mouth at bedtime as needed.     . Multiple Vitamin (MULTI-VITAMINS) TABS Take by mouth.    . Omega-3 Fatty Acids (FISH OIL PO) Take by mouth.    . pravastatin (PRAVACHOL) 20 MG  tablet Take 20 mg by mouth daily.    . predniSONE (DELTASONE) 5 MG tablet Take 5 mg by mouth 3 (three) times daily.    Marland Kitchen aspirin 325 MG EC tablet Take 325 mg by mouth daily.     . vitamin E 400 UNIT capsule Take by  mouth.     No facility-administered medications prior to visit.   Radiology:   No results found.  Cardiac Studies:   Echocardiogram 04/18/2015: Normal LVEF, grade 1 diastolic dysfunction, mild to moderate aortic regurgitation.  Holter monitor 2016: PVCs and PACs and short burst of SVT.  EKG May 2014 ER visit: Documented paroxysmal A. fib with CHA2DS2-VASc risk of 3.  Lexiscan nuclear stress test May 2016: Negative for ischemia, LVEF 82%.  EKG:   EKG 04/19/2020: Sinus bradycardia at rate of 53 bpm, left axis deviation, left anterior fascicular block.  Poor R wave progression, low-voltage complexes.  Pulmonary disease pattern.  No evidence of ischemia, normal QT interval.     Assessment     ICD-10-CM   1. Paroxysmal atrial fibrillation (HCC)  I48.0 EKG 12-Lead  2. Hypercholesteremia  E78.00   3. Mild aortic regurgitation  I35.1     CHA2DS2-VASc Score is 3.  Yearly risk of stroke: 3.2% (A, F).  Score of 1=0.6; 2=2.2; 3=3.2; 4=4.8; 5=7.2; 6=9.8; 7=>9.8) -(CHF; HTN; vasc disease DM,  Female = 1; Age <65 =0; 65-74 = 1,  >75 =2; stroke/embolism= 2).   Medications Discontinued During This Encounter  Medication Reason  . vitamin E 400 UNIT capsule Patient Preference  . aspirin 325 MG EC tablet Error    No orders of the defined types were placed in this encounter.   Recommendations:   [name] is a 79 y.o. Caucasian female with diagnosis of paroxysmal atrial fibrillation with documented EKG in 2014, who is presently on a beta-blocker and magnesium supplements with "no more issues since then", mild hyperlipidemia, GERD, chronic pancreatitis,  history of hemorrhoids with no recent GI bleed presents to establish with me, previously being followed by [facility] and  is presently on aspirin alone as per her wish.  Recently started on steroids for possibility of polymyalgia rheumatica.  Also previously she has had what appears to be obstructive sleep apnea but she has lost about 100 pounds in weight.  Suspect this is truly helped her in maintaining sinus rhythm with regard to atrial fibrillation.  In view of daytime somnolence, continued fatigue, I could entertain nocturnal oximetry and or sleep evaluation.  Otherwise from cardiac standpoint, she is stable, I did not order any tests.  She does have aortic regurgitation by prior echocardiogram however no clinical evidence of heart failure and there is no dyspnea and murmur appears to be very soft and does not appear to be significant.  I will see her back on annual basis.  This was a greater than 75 minutes of office visit time with evaluation of external records, evaluation of external labs and coordination of care.    [name], MD, [facility] 04/19/2020, 10:37 AM Office: [phone]

## 2020-04-30 DIAGNOSIS — E611 Iron deficiency: Secondary | ICD-10-CM | POA: Diagnosis not present

## 2020-04-30 DIAGNOSIS — K5900 Constipation, unspecified: Secondary | ICD-10-CM | POA: Diagnosis not present

## 2020-04-30 DIAGNOSIS — Z23 Encounter for immunization: Secondary | ICD-10-CM | POA: Diagnosis not present

## 2020-04-30 DIAGNOSIS — M353 Polymyalgia rheumatica: Secondary | ICD-10-CM | POA: Diagnosis not present

## 2020-05-12 DIAGNOSIS — M8580 Other specified disorders of bone density and structure, unspecified site: Secondary | ICD-10-CM | POA: Diagnosis not present

## 2020-05-12 DIAGNOSIS — I4891 Unspecified atrial fibrillation: Secondary | ICD-10-CM | POA: Diagnosis not present

## 2020-05-12 DIAGNOSIS — N183 Chronic kidney disease, stage 3 unspecified: Secondary | ICD-10-CM | POA: Diagnosis not present

## 2020-05-12 DIAGNOSIS — E7800 Pure hypercholesterolemia, unspecified: Secondary | ICD-10-CM | POA: Diagnosis not present

## 2020-06-17 DIAGNOSIS — E611 Iron deficiency: Secondary | ICD-10-CM | POA: Diagnosis not present

## 2020-06-17 DIAGNOSIS — M353 Polymyalgia rheumatica: Secondary | ICD-10-CM | POA: Diagnosis not present

## 2020-07-08 DIAGNOSIS — K649 Unspecified hemorrhoids: Secondary | ICD-10-CM | POA: Diagnosis not present

## 2020-07-08 DIAGNOSIS — R1013 Epigastric pain: Secondary | ICD-10-CM | POA: Diagnosis not present

## 2020-07-08 DIAGNOSIS — M353 Polymyalgia rheumatica: Secondary | ICD-10-CM | POA: Diagnosis not present

## 2020-07-08 DIAGNOSIS — R140 Abdominal distension (gaseous): Secondary | ICD-10-CM | POA: Diagnosis not present

## 2020-07-08 DIAGNOSIS — Z7189 Other specified counseling: Secondary | ICD-10-CM | POA: Diagnosis not present

## 2020-07-09 ENCOUNTER — Other Ambulatory Visit: Payer: Self-pay | Admitting: Family Medicine

## 2020-07-09 DIAGNOSIS — R140 Abdominal distension (gaseous): Secondary | ICD-10-CM

## 2020-07-19 ENCOUNTER — Ambulatory Visit (HOSPITAL_COMMUNITY)
Admission: RE | Admit: 2020-07-19 | Discharge: 2020-07-19 | Disposition: A | Discharge status: Home / Self Care | Payer: Medicare Other | Source: Ambulatory Visit | Attending: Family Medicine | Admitting: Family Medicine

## 2020-07-19 ENCOUNTER — Other Ambulatory Visit: Payer: Self-pay | Admitting: Family Medicine

## 2020-07-19 ENCOUNTER — Other Ambulatory Visit: Payer: Self-pay

## 2020-07-19 ENCOUNTER — Other Ambulatory Visit (HOSPITAL_COMMUNITY): Payer: Self-pay | Admitting: Family Medicine

## 2020-07-19 DIAGNOSIS — R140 Abdominal distension (gaseous): Secondary | ICD-10-CM

## 2020-07-19 DIAGNOSIS — M79669 Pain in unspecified lower leg: Secondary | ICD-10-CM | POA: Insufficient documentation

## 2020-07-19 DIAGNOSIS — R600 Localized edema: Secondary | ICD-10-CM | POA: Diagnosis not present

## 2020-07-19 DIAGNOSIS — R1900 Intra-abdominal and pelvic swelling, mass and lump, unspecified site: Secondary | ICD-10-CM

## 2020-07-19 DIAGNOSIS — R1013 Epigastric pain: Secondary | ICD-10-CM | POA: Diagnosis not present

## 2020-07-19 DIAGNOSIS — M7989 Other specified soft tissue disorders: Secondary | ICD-10-CM | POA: Diagnosis not present

## 2020-07-19 DIAGNOSIS — K649 Unspecified hemorrhoids: Secondary | ICD-10-CM | POA: Diagnosis not present

## 2020-07-19 DIAGNOSIS — R188 Other ascites: Secondary | ICD-10-CM | POA: Diagnosis not present

## 2020-07-19 NOTE — Progress Notes (Signed)
Bilateral lower extremity venous duplex has been completed. Preliminary results can be found in CV Proc through chart review.  Results were given to Dr. Jacelyn Grip.  07/19/20 3:13 PM [name] RVT

## 2020-07-22 ENCOUNTER — Other Ambulatory Visit: Payer: Self-pay

## 2020-07-22 ENCOUNTER — Ambulatory Visit (HOSPITAL_COMMUNITY)
Admission: RE | Admit: 2020-07-22 | Discharge: 2020-07-22 | Disposition: A | Discharge status: Home / Self Care | Payer: Medicare Other | Source: Ambulatory Visit | Attending: Family Medicine | Admitting: Family Medicine

## 2020-07-22 ENCOUNTER — Encounter (HOSPITAL_COMMUNITY): Payer: Self-pay

## 2020-07-22 DIAGNOSIS — R1900 Intra-abdominal and pelvic swelling, mass and lump, unspecified site: Secondary | ICD-10-CM | POA: Diagnosis not present

## 2020-07-22 DIAGNOSIS — D171 Benign lipomatous neoplasm of skin and subcutaneous tissue of trunk: Secondary | ICD-10-CM | POA: Diagnosis not present

## 2020-07-22 DIAGNOSIS — K449 Diaphragmatic hernia without obstruction or gangrene: Secondary | ICD-10-CM | POA: Diagnosis not present

## 2020-07-22 DIAGNOSIS — I898 Other specified noninfective disorders of lymphatic vessels and lymph nodes: Secondary | ICD-10-CM | POA: Diagnosis not present

## 2020-07-22 DIAGNOSIS — C569 Malignant neoplasm of unspecified ovary: Secondary | ICD-10-CM | POA: Diagnosis not present

## 2020-07-22 MED ORDER — IOHEXOL 9 MG/ML PO SOLN
ORAL | Status: AC
  Filled 2020-07-22: qty 1000

## 2020-07-22 MED ORDER — IOHEXOL 9 MG/ML PO SOLN
1000.0000 mL | ORAL | Status: AC
  Administered 2020-07-22: 1000 mL via ORAL

## 2020-07-22 MED ORDER — IOHEXOL 300 MG/ML  SOLN
100.0000 mL | Freq: Once | INTRAMUSCULAR | Status: AC | PRN
  Administered 2020-07-22: 100 mL via INTRAVENOUS

## 2020-07-23 ENCOUNTER — Telehealth: Payer: Self-pay | Admitting: *Deleted

## 2020-07-23 NOTE — Telephone Encounter (Signed)
Attempted to reach the patient to schedule, no answer.

## 2020-07-23 NOTE — Telephone Encounter (Signed)
Returned the patient voicemail message, scheduled the patient for 1/28 at 3 pm with Dr Berline Lopes

## 2020-07-24 ENCOUNTER — Other Ambulatory Visit: Payer: Medicare Other

## 2020-07-24 DIAGNOSIS — M25569 Pain in unspecified knee: Secondary | ICD-10-CM | POA: Insufficient documentation

## 2020-07-24 DIAGNOSIS — J329 Chronic sinusitis, unspecified: Secondary | ICD-10-CM | POA: Insufficient documentation

## 2020-07-24 DIAGNOSIS — H9190 Unspecified hearing loss, unspecified ear: Secondary | ICD-10-CM | POA: Insufficient documentation

## 2020-07-24 DIAGNOSIS — R180 Malignant ascites: Secondary | ICD-10-CM | POA: Insufficient documentation

## 2020-07-24 DIAGNOSIS — M461 Sacroiliitis, not elsewhere classified: Secondary | ICD-10-CM | POA: Insufficient documentation

## 2020-07-24 DIAGNOSIS — G479 Sleep disorder, unspecified: Secondary | ICD-10-CM | POA: Insufficient documentation

## 2020-07-24 DIAGNOSIS — L309 Dermatitis, unspecified: Secondary | ICD-10-CM | POA: Insufficient documentation

## 2020-07-24 DIAGNOSIS — E611 Iron deficiency: Secondary | ICD-10-CM | POA: Insufficient documentation

## 2020-07-24 DIAGNOSIS — E7800 Pure hypercholesterolemia, unspecified: Secondary | ICD-10-CM | POA: Insufficient documentation

## 2020-07-24 DIAGNOSIS — R351 Nocturia: Secondary | ICD-10-CM | POA: Insufficient documentation

## 2020-07-24 DIAGNOSIS — Z8719 Personal history of other diseases of the digestive system: Secondary | ICD-10-CM | POA: Insufficient documentation

## 2020-07-24 DIAGNOSIS — L719 Rosacea, unspecified: Secondary | ICD-10-CM | POA: Insufficient documentation

## 2020-07-24 DIAGNOSIS — J309 Allergic rhinitis, unspecified: Secondary | ICD-10-CM | POA: Insufficient documentation

## 2020-07-24 DIAGNOSIS — R188 Other ascites: Secondary | ICD-10-CM | POA: Insufficient documentation

## 2020-07-24 DIAGNOSIS — N183 Chronic kidney disease, stage 3 unspecified: Secondary | ICD-10-CM | POA: Insufficient documentation

## 2020-07-24 DIAGNOSIS — M353 Polymyalgia rheumatica: Secondary | ICD-10-CM | POA: Insufficient documentation

## 2020-07-24 DIAGNOSIS — E559 Vitamin D deficiency, unspecified: Secondary | ICD-10-CM | POA: Insufficient documentation

## 2020-07-24 DIAGNOSIS — M8580 Other specified disorders of bone density and structure, unspecified site: Secondary | ICD-10-CM | POA: Insufficient documentation

## 2020-07-24 DIAGNOSIS — K5900 Constipation, unspecified: Secondary | ICD-10-CM | POA: Insufficient documentation

## 2020-07-24 DIAGNOSIS — R233 Spontaneous ecchymoses: Secondary | ICD-10-CM | POA: Insufficient documentation

## 2020-07-24 DIAGNOSIS — R413 Other amnesia: Secondary | ICD-10-CM | POA: Insufficient documentation

## 2020-07-25 ENCOUNTER — Encounter: Payer: Self-pay | Admitting: Oncology

## 2020-07-25 ENCOUNTER — Encounter: Payer: Self-pay | Admitting: Gynecologic Oncology

## 2020-07-25 DIAGNOSIS — R1900 Intra-abdominal and pelvic swelling, mass and lump, unspecified site: Secondary | ICD-10-CM

## 2020-07-25 NOTE — Progress Notes (Signed)
Scheduled paracentesis per Dr. Berline Lopes for Monday, 07/29/20 at 2:30.

## 2020-07-25 NOTE — Progress Notes (Signed)
GYNECOLOGIC ONCOLOGY NEW PATIENT CONSULTATION   Patient Name: [name]  Patient Age: 80 y.o. Date of Service: 07/26/20 Referring Provider: [name], [place],   [id number]   Primary Care Provider: [name], MD Consulting Provider: Jeral Pinch, MD   Assessment/Plan:  Postmenopausal patient with significant history of abdominal symptoms as well as imaging findings concerning for metastatic gynecologic cancer, likely ovarian.  I discussed that I believe she likely has stage IV ovarian cancer. I discussed that the treatment approach for this disease is typically combination of cytoreductive surgery and chemotherapy. I discussed that sequencing of this can be either with upfront debulking followed by adjuvant chemotherapy sequentially or neoadjuvant chemotherapy followed by an interval cytoreductive attempt, then additional chemotherapy. This latter approach is associated with a reduced perioperative morbidity at the time of surgery.  I discussed that decisions regarding sequencing of therapy is individualized taking into account individual patient health, in addition to the apparent tumor distribution on imaging, and likelihood of complete surgical resection at the time of surgery. I discussed that the overall survival observed in patients is equivalent for both approaches (neoadjuvant chemotherapy versus primary debulking surgery) provided that there is an optimal cytoreductive effort at the time of surgery (regardless of the timing of that surgery). Given the observed decreased morbidity of neoadjuvant chemotherapy and her burden of disease, with preserved survival outcomes, I favor this approach for her.   In terms of establishing a diagnosis, I have scheduled the patient for paracentesis on Monday.  After reviewing her imaging with one of the interventional radiologist, there is no peritoneal nodularity large enough to target for tissue biopsy.   I will call the patient with these results and if nondiagnostic, then we discussed the next step would be diagnostic surgery for tissue biopsy.  Patient was also scheduled to see Dr. Alvy Bimler.  Splane the role of medical oncology and her treatment and outlined the typical timeline of 3-4 cycles of neoadjuvant chemotherapy, followed by imaging and evaluation for interval surgical debulking.  Ordered a CA-125 for the patient to get on Monday when she comes in for her paracentesis.  I have also ordered a chest CT to complete her imaging staging.  I discussed the role of genetic counseling and testing and that a referral would be placed for genetics once we have a diagnosis of malignancy.  We discussed some of her symptoms, most of which I think are related to her ascites and peritoneal disease.  In terms of her nutrition, we discussed maintaining her nutrition and using nutritional supplement as needed.  In terms of her leg swelling, I discussed that this is likely related to her ascites as well as a component of worsened nutrition for her over the last 6 months.  A copy of this note was sent to the patient's referring provider.   81 minutes of total time was spent for this patient encounter, including preparation, face-to-face counseling with the patient and coordination of care, and documentation of the encounter.   Jeral Pinch, MD  Division of Gynecologic Oncology  Department of Obstetrics and Gynecology  [facility]  ___________________________________________  Chief Complaint: Chief Complaint  Patient presents with  . Pelvic mass    History of Present Illness:  [name] is a 80 y.o. y.o. female who is seen in consultation at the request of [name], MD for an evaluation of an a constellation of symptoms and imaging findings concerning  for gynecologic malignancy.  Patient notes that beginning last summer, she had increased fatigue.  This was  regardless of how many naps she took during the day and how long she slept at night.  She went to see her primary care provider in September reportingweakness, decreased appetite and fatigue.  She had blood work done at that time, was started on prednisone which initially helped her feel better, but then her symptoms slowly worsened as her prednisone was tapered.  She went back for follow-up on 07/08/20 with continued to have symptoms, now endorsing increased flatus and belching, constipation, decreased appetite but weight gain.  She had repeat lab work and was told that this was relatively and normal.  Given increasing abdominal girth and leg swelling, she called to have another visit and was seen on 1/21.  On exam, her abdomen was noted to be distended.  A CT was ordered after an x-ray showed a pelvic mass and ascites.  CT A/P from 1/24 shows small right pleural effusion with calcified nodules at the anterior juxta diaphragmatic space which measure up to 6cm. Pelvic calcified mass measured 14cm. Mass to the right and posterior, complex, measures 6cm, with dnse calcified components. Large volume ascites with areas of faint peritoneal thickening in the gutters.  Today, the patient presents with her husband.  She notes several months of intermittent constipation that is worsened due to her history of hemorrhoids.  She occasionally has some rectal bleeding secondary to hemorrhoids.  She denies any vaginal bleeding or discharge.  She was trying to lose weight for some time secondary to her arthritis and symptoms from this.  She lost about 115 pounds but more recently has put some of this weight on.  Her clothes have not been fitting well due to increasing abdominal girth.  She denies any urinary symptoms.  Her appetite has been up and down.  At times she has significant nausea which limits her p.o. intake.  Some days she is able to eat normally and other days she endorses early satiety and abdominal bloating.  At  night, more recently, she has been having trouble sleeping on 1 or both sides and sometimes feels short of breath.  She breathes normally at rest but becomes short of breath with activity.  PAST MEDICAL HISTORY:  Past Medical History:  Diagnosis Date  . Arthritis    right shoulder replacement, right replacement   . Atrial fibrillation ([place])   . Chronic pancreatitis ([place])   . Constipation   . Osteopenia   . Pancreatitis 1990s  . Syncope 03/2011     PAST SURGICAL HISTORY:  Past Surgical History:  Procedure Laterality Date  . APPENDECTOMY  1955  . CHOLECYSTECTOMY  1998  . CYSTECTOMY  1981   [place]  . JOINT REPLACEMENT     right shoulder 2008; right hip 2016  . [place]  . TOTAL HIP ARTHROPLASTY Right 11/2014   [facility], [place], [place]    OB/GYN HISTORY:  [place] History  Gravida Para Term Preterm AB Living  2 2          SAB IAB Ectopic Multiple Live Births               # Outcome Date GA Lbr Len/2nd Weight Sex Delivery Anes PTL Lv  2 Para           1 Para             No LMP recorded. Patient is postmenopausal.  Age at [age]ar[age] Age at menopause: 51 Hx of HRT: Denies Hx of STDs: Denies Last pap: Unsure History of abnormal pap smears: Denies  SCREENING STUDIES:  Last mammogram: Unsure, thinks 4-5 years ago  Last colonoscopy: Has never had, thinks she may have done Cologuard at some point in the past Last bone mineral density: Unsure  MEDICATIONS: Outpatient Encounter Medications as of 07/26/2020  Medication Sig  . atenolol (TENORMIN) 25 MG tablet Take 1 tablet (25 mg total) by mouth daily.  Marland Kitchen CALCIUM PO Take 1,000 mg by mouth daily as needed.  . Cholecalciferol (VITAMIN D3 PO) Take 2,000 Units by mouth daily.   . Coenzyme Q10 (CO Q 10 PO) Take 300 mg by mouth daily.   . hydrocortisone (ANUSOL-HC) 2.5 % rectal cream Place 1 application rectally 2 (two) times daily as needed.  . magnesium oxide (MAG-OX) 400 MG tablet Take by mouth  daily.   . Melatonin 1 MG CAPS Take 5 mg by mouth at bedtime as needed.   . Multiple Vitamin (MULTI-VITAMINS) TABS Take 1 tablet by mouth daily.  . pravastatin (PRAVACHOL) 20 MG tablet Take 1 tablet (20 mg total) by mouth daily.  . predniSONE (DELTASONE) 1 MG tablet Take 9 tablets by mouth daily. 07/25/2020-Titrating down over the next 2 weeks  . acetaminophen (TYLENOL) 500 MG chewable tablet Chew 500 mg by mouth. Takes up to 6 a day.  . Omega-3 Fatty Acids (FISH OIL PO) Take by mouth. (Patient not taking: Reported on 07/25/2020)  . [DISCONTINUED] Loma Boston Calcium 500 MG TABS Take 1 tablet by mouth 2 (two) times daily.  . [DISCONTINUED] Specialty Vitamins Products (COLLAGEN ULTRA) CAPS Take 1 capsule by mouth See admin instructions.  . [DISCONTINUED] vitamin E 180 MG (400 UNITS) capsule Take 1 capsule by mouth daily.   No facility-administered encounter medications on file as of 07/26/2020.    ALLERGIES:  Allergies  Allergen Reactions  . Cephalexin Other (See Comments)    dehydrated  . Hydrocodone-Guaifenesin     Dizzy  Other reaction(s): dizzy  . Tape Other (See Comments)    Surgical tape Other reaction(s): Unknown  . Tramadol     constipation  . Tramadol Hcl     Other reaction(s): Constipation     FAMILY HISTORY:  Family History  Problem Relation Age of Onset  . Dementia Mother   . Memory loss Brother   . Hodgkin's lymphoma Father   . Kidney failure Brother   . Cancer Paternal Grandfather        unknown  . Breast cancer Maternal Aunt      SOCIAL HISTORY:  Social Connections: Not on file    REVIEW OF SYSTEMS:  Denies fevers, chills. Denies hearing loss, neck lumps or masses, mouth sores, ringing in ears or voice changes. Denies cough or wheezing.  . Denies chest pain or palpitations.  Denies abdominal pain, blood in stools, diarrhea, nausea, vomiting. Denies pain with intercourse, dysuria, frequency, hematuria or incontinence. Denies hot flashes, pelvic pain,  vaginal bleeding or vaginal discharge.   Denies back pain or muscle pain/cramps. Denies itching, rash, or wounds. Denies dizziness, headaches, numbness or seizures. Denies swollen lymph nodes or glands, denies easy bruising or bleeding. Denies anxiety, depression, confusion, or decreased concentration.  Physical Exam:  Vital Signs for this encounter:  Blood pressure 139/73, pulse 72, temperature 97.6 F (36.4 C), temperature source Tympanic, resp. rate 20, height 5\' 1"  (1.549 m), weight 159 lb (72.1 kg), SpO2 98 %. Body mass index is  30.04 kg/m. General: Alert, oriented, no acute distress.  HEENT: Normocephalic, atraumatic. Sclera anicteric.  Chest: Clear to auscultation bilaterally. No wheezes, rhonchi, or rales. Cardiovascular: Regular rate and rhythm, no murmurs, rubs, or gallops.  Abdomen: Normoactive bowel sounds.  Tense, moderate to significantly distended, nontender to palpation. No masses or hepatosplenomegaly appreciated. + palpable fluid wave.  Extremities: Grossly normal range of motion. Warm, well perfused.  2+ edema bilaterally.  Skin: No rashes or lesions.  Lymphatics: No cervical, supraclavicular, or inguinal adenopathy.  GU:  Normal external female genitalia. No lesions. No discharge or bleeding.             Bladder/urethra:  No lesions or masses, well supported bladder             Vagina/cervix: Moderately atrophic, cervix normal-appearing, no lesions or masses.             Uterus: Small, mobile, no parametrial involvement.             Adnexa: No masses appreciated in the cul-de-sac, given large volume ascites, adnexal mass not appreciated on pelvic or abdominal exam.  There is some nodularity along the pelvic floor.  This was confirmed on rectal exam..   LABORATORY AND RADIOLOGIC DATA:  Outside medical records were reviewed to synthesize the above history, along with the history and physical obtained during the visit.   Lab Results  Component Value Date   WBC 6.3  11/17/2010   HGB 15.3 (H) 11/17/2010   HCT 45.0 11/17/2010   PLT 148 (L) 11/17/2010   GLUCOSE 95 05/29/2019   NA 141 05/29/2019   K 4.9 05/29/2019   CL 104 05/29/2019   CREATININE 0.86 05/29/2019   BUN 29 (H) 05/29/2019   CO2 24 05/29/2019   TSH 1.910 05/29/2019   INR 0.95 11/17/2010   Lab work performed on 07/10/2020: WBC 7.8, hemoglobin 13.6, hematocrit 39.2, platelets 296 Sodium 140, potassium 5.4, chloride 101, bicarb 31, creatinine 0.99, BUN 77, glucose 101 Total protein 6.3, albumin 3.6, AST 16, ALT 11 Lipase 74 Amylase 67

## 2020-07-26 ENCOUNTER — Inpatient Hospital Stay: Payer: Medicare Other | Attending: Gynecologic Oncology | Admitting: Gynecologic Oncology

## 2020-07-26 ENCOUNTER — Encounter: Payer: Self-pay | Admitting: Oncology

## 2020-07-26 ENCOUNTER — Encounter: Payer: Self-pay | Admitting: Gynecologic Oncology

## 2020-07-26 ENCOUNTER — Other Ambulatory Visit: Payer: Self-pay

## 2020-07-26 VITALS — BP 139/73 | HR 72 | Temp 97.6°F | Resp 20 | Ht 61.0 in | Wt 159.0 lb

## 2020-07-26 DIAGNOSIS — R600 Localized edema: Secondary | ICD-10-CM | POA: Insufficient documentation

## 2020-07-26 DIAGNOSIS — C577 Malignant neoplasm of other specified female genital organs: Secondary | ICD-10-CM

## 2020-07-26 DIAGNOSIS — R1903 Right lower quadrant abdominal swelling, mass and lump: Secondary | ICD-10-CM | POA: Diagnosis not present

## 2020-07-26 DIAGNOSIS — M1990 Unspecified osteoarthritis, unspecified site: Secondary | ICD-10-CM | POA: Diagnosis not present

## 2020-07-26 DIAGNOSIS — M8580 Other specified disorders of bone density and structure, unspecified site: Secondary | ICD-10-CM | POA: Diagnosis not present

## 2020-07-26 DIAGNOSIS — I4891 Unspecified atrial fibrillation: Secondary | ICD-10-CM | POA: Diagnosis not present

## 2020-07-26 DIAGNOSIS — R188 Other ascites: Secondary | ICD-10-CM | POA: Insufficient documentation

## 2020-07-26 DIAGNOSIS — R1900 Intra-abdominal and pelvic swelling, mass and lump, unspecified site: Secondary | ICD-10-CM

## 2020-07-26 DIAGNOSIS — R180 Malignant ascites: Secondary | ICD-10-CM | POA: Diagnosis not present

## 2020-07-26 DIAGNOSIS — Z79899 Other long term (current) drug therapy: Secondary | ICD-10-CM | POA: Diagnosis not present

## 2020-07-26 DIAGNOSIS — K5900 Constipation, unspecified: Secondary | ICD-10-CM | POA: Diagnosis not present

## 2020-07-26 DIAGNOSIS — R630 Anorexia: Secondary | ICD-10-CM | POA: Diagnosis not present

## 2020-07-26 NOTE — Patient Instructions (Signed)
It was a pleasure meeting you. I will call you next week once results are back from your procedure.

## 2020-07-26 NOTE — Progress Notes (Signed)
Met with Angeline and her husband and daughter after Dr. Charisse March appointment.  Went over upcoming appointments and provided them with [employer].  Encouraged them to call with any questions.

## 2020-07-29 ENCOUNTER — Other Ambulatory Visit: Payer: Self-pay

## 2020-07-29 ENCOUNTER — Ambulatory Visit (HOSPITAL_COMMUNITY)
Admission: RE | Admit: 2020-07-29 | Discharge: 2020-07-29 | Disposition: A | Discharge status: Home / Self Care | Payer: Medicare Other | Source: Ambulatory Visit | Attending: Gynecologic Oncology | Admitting: Gynecologic Oncology

## 2020-07-29 ENCOUNTER — Other Ambulatory Visit: Payer: Self-pay | Admitting: Gynecologic Oncology

## 2020-07-29 ENCOUNTER — Inpatient Hospital Stay: Payer: Medicare Other

## 2020-07-29 DIAGNOSIS — R630 Anorexia: Secondary | ICD-10-CM | POA: Diagnosis not present

## 2020-07-29 DIAGNOSIS — M1990 Unspecified osteoarthritis, unspecified site: Secondary | ICD-10-CM | POA: Diagnosis not present

## 2020-07-29 DIAGNOSIS — R1903 Right lower quadrant abdominal swelling, mass and lump: Secondary | ICD-10-CM | POA: Diagnosis not present

## 2020-07-29 DIAGNOSIS — I4891 Unspecified atrial fibrillation: Secondary | ICD-10-CM | POA: Diagnosis not present

## 2020-07-29 DIAGNOSIS — C7989 Secondary malignant neoplasm of other specified sites: Secondary | ICD-10-CM | POA: Insufficient documentation

## 2020-07-29 DIAGNOSIS — R1900 Intra-abdominal and pelvic swelling, mass and lump, unspecified site: Secondary | ICD-10-CM

## 2020-07-29 DIAGNOSIS — R180 Malignant ascites: Secondary | ICD-10-CM | POA: Diagnosis not present

## 2020-07-29 DIAGNOSIS — R188 Other ascites: Secondary | ICD-10-CM | POA: Diagnosis not present

## 2020-07-29 DIAGNOSIS — C577 Malignant neoplasm of other specified female genital organs: Secondary | ICD-10-CM

## 2020-07-29 DIAGNOSIS — K5900 Constipation, unspecified: Secondary | ICD-10-CM | POA: Diagnosis not present

## 2020-07-29 DIAGNOSIS — C801 Malignant (primary) neoplasm, unspecified: Secondary | ICD-10-CM | POA: Diagnosis not present

## 2020-07-29 DIAGNOSIS — R600 Localized edema: Secondary | ICD-10-CM | POA: Diagnosis not present

## 2020-07-29 LAB — BASIC METABOLIC PANEL - CANCER CENTER ONLY
Anion gap: 8 (ref 5–15)
BUN: 29 mg/dL — ABNORMAL HIGH (ref 8–23)
CO2: 27 mmol/L (ref 22–32)
Calcium: 8.8 mg/dL — ABNORMAL LOW (ref 8.9–10.3)
Chloride: 106 mmol/L (ref 98–111)
Creatinine: 1.02 mg/dL — ABNORMAL HIGH (ref 0.44–1.00)
GFR, Estimated: 55 mL/min — ABNORMAL LOW (ref >60)
Glucose, Bld: 138 mg/dL — ABNORMAL HIGH (ref 70–99)
Potassium: 4.3 mmol/L (ref 3.5–5.1)
Sodium: 141 mmol/L (ref 135–145)

## 2020-07-29 MED ORDER — LIDOCAINE HCL 1 % IJ SOLN
INTRAMUSCULAR | Status: AC
  Filled 2020-07-29: qty 20

## 2020-07-29 NOTE — Procedures (Signed)
Ultrasound-guided diagnostic and therapeutic paracentesis performed yielding 5 liters (maximum ordered) of yellow fluid. No immediate complications.  A portion of the fluid was submitted to the lab for cytology .EBL none.

## 2020-07-30 ENCOUNTER — Ambulatory Visit (HOSPITAL_COMMUNITY)
Admission: RE | Admit: 2020-07-30 | Discharge: 2020-07-30 | Disposition: A | Discharge status: Home / Self Care | Payer: Medicare Other | Source: Ambulatory Visit | Attending: Gynecologic Oncology | Admitting: Gynecologic Oncology

## 2020-07-30 ENCOUNTER — Other Ambulatory Visit: Payer: Self-pay | Admitting: Hematology and Oncology

## 2020-07-30 ENCOUNTER — Inpatient Hospital Stay: Payer: Medicare Other | Attending: Hematology and Oncology | Admitting: Hematology and Oncology

## 2020-07-30 VITALS — BP 123/55 | HR 56 | Temp 98.1°F | Resp 18 | Ht 61.0 in | Wt 148.8 lb

## 2020-07-30 DIAGNOSIS — C569 Malignant neoplasm of unspecified ovary: Secondary | ICD-10-CM | POA: Diagnosis not present

## 2020-07-30 DIAGNOSIS — J90 Pleural effusion, not elsewhere classified: Secondary | ICD-10-CM | POA: Diagnosis not present

## 2020-07-30 DIAGNOSIS — N183 Chronic kidney disease, stage 3 unspecified: Secondary | ICD-10-CM | POA: Diagnosis not present

## 2020-07-30 DIAGNOSIS — R180 Malignant ascites: Secondary | ICD-10-CM | POA: Diagnosis not present

## 2020-07-30 DIAGNOSIS — Z5111 Encounter for antineoplastic chemotherapy: Secondary | ICD-10-CM | POA: Diagnosis not present

## 2020-07-30 DIAGNOSIS — Z87891 Personal history of nicotine dependence: Secondary | ICD-10-CM | POA: Diagnosis not present

## 2020-07-30 DIAGNOSIS — I898 Other specified noninfective disorders of lymphatic vessels and lymph nodes: Secondary | ICD-10-CM | POA: Diagnosis not present

## 2020-07-30 DIAGNOSIS — R1900 Intra-abdominal and pelvic swelling, mass and lump, unspecified site: Secondary | ICD-10-CM | POA: Diagnosis not present

## 2020-07-30 DIAGNOSIS — K5909 Other constipation: Secondary | ICD-10-CM

## 2020-07-30 DIAGNOSIS — I480 Paroxysmal atrial fibrillation: Secondary | ICD-10-CM | POA: Diagnosis not present

## 2020-07-30 DIAGNOSIS — I2510 Atherosclerotic heart disease of native coronary artery without angina pectoris: Secondary | ICD-10-CM | POA: Diagnosis not present

## 2020-07-30 DIAGNOSIS — Z8543 Personal history of malignant neoplasm of ovary: Secondary | ICD-10-CM | POA: Diagnosis not present

## 2020-07-30 DIAGNOSIS — R634 Abnormal weight loss: Secondary | ICD-10-CM | POA: Diagnosis not present

## 2020-07-30 LAB — CA 125: Cancer Antigen (CA) 125: 771 U/mL — ABNORMAL HIGH (ref 0.0–38.1)

## 2020-07-30 MED ORDER — ONDANSETRON HCL 8 MG PO TABS: 8.0000 mg | ORAL_TABLET | Freq: Three times a day (TID) | ORAL | 1 refills | Status: DC | PRN

## 2020-07-30 MED ORDER — IOHEXOL 300 MG/ML  SOLN
75.0000 mL | Freq: Once | INTRAMUSCULAR | Status: AC | PRN
  Administered 2020-07-30: 75 mL via INTRAVENOUS

## 2020-07-30 MED ORDER — PROCHLORPERAZINE MALEATE 10 MG PO TABS: 10.0000 mg | ORAL_TABLET | Freq: Four times a day (QID) | ORAL | 1 refills | Status: DC | PRN

## 2020-07-30 MED ORDER — DEXAMETHASONE 4 MG PO TABS: ORAL_TABLET | ORAL | 6 refills | Status: DC

## 2020-07-30 MED FILL — ONDANSETRON HCL 8 MG TABLET: 8 | 10 days supply | Qty: 30 | Fill #0

## 2020-07-30 MED FILL — PROCHLORPERAZINE 10 MG TAB: 10 | 7 days supply | Qty: 30 | Fill #0

## 2020-07-30 MED FILL — DEXAMETHASONE 4 MG TABLET: 4 | 84 days supply | Qty: 16 | Fill #0

## 2020-07-30 NOTE — Progress Notes (Signed)
START ON PATHWAY REGIMEN - Ovarian     A cycle is every 21 days:     Paclitaxel      Carboplatin   **Always confirm dose/schedule in your pharmacy ordering system**  Patient Characteristics: Preoperative or Nonsurgical Candidate (Clinical Staging), Newly Diagnosed, Neoadjuvant Therapy followed by Surgery BRCA Mutation Status: Awaiting Test Results Therapeutic Status: Preoperative or Nonsurgical Candidate (Clinical Staging) AJCC T Category: cTX AJCC 8 Stage Grouping: IIIC AJCC N Category: cNX AJCC M Category: cM0 Therapy Plan: Neoadjuvant Therapy followed by Surgery Intent of Therapy: Curative Intent, Discussed with Patient 

## 2020-07-31 ENCOUNTER — Telehealth: Payer: Self-pay | Admitting: Gynecologic Oncology

## 2020-07-31 ENCOUNTER — Telehealth: Payer: Self-pay

## 2020-07-31 ENCOUNTER — Encounter: Payer: Self-pay | Admitting: Hematology and Oncology

## 2020-07-31 DIAGNOSIS — R634 Abnormal weight loss: Secondary | ICD-10-CM | POA: Insufficient documentation

## 2020-07-31 DIAGNOSIS — J90 Pleural effusion, not elsewhere classified: Secondary | ICD-10-CM | POA: Insufficient documentation

## 2020-07-31 NOTE — Telephone Encounter (Signed)
He called and left a message asking for a return call.  Called back. He is concerned that his wife does not have infusion appts. Told him the scheduler is working on it and they will get a call. Reviewed tomorrows appts and per his request changed 4 pm chemo education to in person.

## 2020-07-31 NOTE — Assessment & Plan Note (Signed)
I have reviewed her imaging studies and results of her tumor marker Cytology from paracentesis is pending CT imaging showed also right sided pleural effusion but she is not symptomatic She has significant relief after therapeutic paracentesis Once we confirmed malignant cytology, she can proceed with chemotherapy next week Overall, my impression is that she has stage IV ovarian cancer  We reviewed the NCCN guidelines We discussed the role of chemotherapy. The intent is of curative intent.  We discussed some of the risks, benefits, side-effects of carboplatin & Taxol. Treatment is intravenous, every 3 weeks x 6 cycles  Some of the short term side-effects included, though not limited to, including weight loss, life threatening infections, risk of allergic reactions, need for transfusions of blood products, nausea, vomiting, change in bowel habits, loss of hair, admission to hospital for various reasons, and risks of death.   Long term side-effects are also discussed including risks of infertility, permanent damage to nerve function, hearing loss, chronic fatigue, kidney damage with possibility needing hemodialysis, and rare secondary malignancy including bone marrow disorders.  The patient is aware that the response rates discussed earlier is not guaranteed.  After a long discussion, patient made an informed decision to proceed with the prescribed plan of care.   Patient education material was dispensed. We discussed premedication with dexamethasone before chemotherapy. She has good venous access and would not require port placement I plan to see her within a week after chemotherapy for supportive management

## 2020-07-31 NOTE — Assessment & Plan Note (Signed)
She had 100 pound weight loss over the course of the year While she has intentional weight loss through effort, I suspect the cancer contributed to this major weight loss I recommend frequent small meals

## 2020-07-31 NOTE — Assessment & Plan Note (Signed)
She had therapeutic paracentesis yesterday She still have fluid in her abdomen but I do not recommend further paracentesis right now We will observe closely The patient is educated to watch out for signs and symptoms of severe shortness of breath and if that is present, we will order another paracentesis

## 2020-07-31 NOTE — Assessment & Plan Note (Signed)
I will monitor her renal function closely and adjust the dose of treatment accordingly

## 2020-07-31 NOTE — Assessment & Plan Note (Signed)
Constipation is likely due to carcinomatosis We discussed aggressive laxative therapy

## 2020-07-31 NOTE — Assessment & Plan Note (Signed)
Clinically, she appears in sinus rhythm She has lost a lot of weight and have occasional dizziness She is bradycardic I recommend slow taper of atenolol

## 2020-07-31 NOTE — Assessment & Plan Note (Signed)
She is not symptomatic Observe closely for now 

## 2020-07-31 NOTE — Telephone Encounter (Signed)
Called the patient to share results of paracentesis and CT chest. No answer, not able to leave VM.  Jeral Pinch MD Gynecologic Oncology

## 2020-07-31 NOTE — Progress Notes (Signed)
[place] NOTE  Patient Care Team: [name], MD as PCP - General (Family Medicine) [name], MD as Consulting Physician (Cardiology)  ASSESSMENT & PLAN:  Ovarian ca [facility]) I have reviewed her imaging studies and results of her tumor marker Cytology from paracentesis is pending CT imaging showed also right sided pleural effusion but she is not symptomatic She has significant relief after therapeutic paracentesis Once we confirmed malignant cytology, she can proceed with chemotherapy next week Overall, my impression is that she has stage IV ovarian cancer  We reviewed the NCCN guidelines We discussed the role of chemotherapy. The intent is of curative intent.  We discussed some of the risks, benefits, side-effects of carboplatin & Taxol. Treatment is intravenous, every 3 weeks x 6 cycles  Some of the short term side-effects included, though not limited to, including weight loss, life threatening infections, risk of allergic reactions, need for transfusions of blood products, nausea, vomiting, change in bowel habits, loss of hair, admission to hospital for various reasons, and risks of death.   Long term side-effects are also discussed including risks of infertility, permanent damage to nerve function, hearing loss, chronic fatigue, kidney damage with possibility needing hemodialysis, and rare secondary malignancy including bone marrow disorders.  The patient is aware that the response rates discussed earlier is not guaranteed.  After a long discussion, patient made an informed decision to proceed with the prescribed plan of care.   Patient education material was dispensed. We discussed premedication with dexamethasone before chemotherapy. She has good venous access and would not require port placement I plan to see her within a week after chemotherapy for supportive management   Ascites She had therapeutic paracentesis yesterday She still have fluid  in her abdomen but I do not recommend further paracentesis right now We will observe closely The patient is educated to watch out for signs and symptoms of severe shortness of breath and if that is present, we will order another paracentesis  Atrial fibrillation ([place]) Clinically, she appears in sinus rhythm She has lost a lot of weight and have occasional dizziness She is bradycardic I recommend slow taper of atenolol  Chronic kidney disease, stage 3 unspecified (HCC) I will monitor her renal function closely and adjust the dose of treatment accordingly  Constipation Constipation is likely due to carcinomatosis We discussed aggressive laxative therapy  Pleural effusion on right She is not symptomatic Observe closely for now  Weight loss, abnormal She had 100 pound weight loss over the course of the year While she has intentional weight loss through effort, I suspect the cancer contributed to this major weight loss I recommend frequent small meals   Orders Placed This Encounter  Procedures  . CBC with Differential (Cancer Center Only)    Standing Status:   Standing    Number of Occurrences:   20    Standing Expiration Date:   07/30/2021  . CMP ([place] only)    Standing Status:   Standing    Number of Occurrences:   20    Standing Expiration Date:   07/30/2021  . CA 125    Standing Status:   Standing    Number of Occurrences:   11    Standing Expiration Date:   07/30/2021    The total time spent in the appointment was 100 minutes encounter with patients including review of chart and various tests results, discussions about plan of care and coordination of care plan   All  questions were answered. The patient knows to call the clinic with any problems, questions or concerns. No barriers to learning was detected.  [name], MD 2/2/202210:33 AM  CHIEF COMPLAINTS/PURPOSE OF CONSULTATION:  Ovarian mass, malignant ascites, elevated tumor marker, ovarian cancer until  proven otherwise  HISTORY OF PRESENTING ILLNESS:  [name] 80 y.o. female is here because of recent diagnosis of ovarian cancer She is here accompanied by her husband, Josph Macho and her daughter, [name] She is retired She has 2 daughters Prior to her diagnosis, the patient had history of class III obesity She used to weigh up to 246 pounds The patient started on a journey of intentional weight loss over a year ago because of chronic ascites Since last summer, she started to have severe fatigue, early satiety, anorexia and bloating She also started to have irregular bowel habits and became profoundly constipated For short period of time, she has success in controlling her symptoms with prednisone but then with progressive weight loss and abdominal distention, she undergo further work-up including x-ray and CT imaging which showed ovarian mass She was referred to see GYN surgeon who ordered paracentesis She had 5 L of malignant ascites drained Cytology is pending She has some occasional dizziness She was noted to be bradycardic Her doctor has to put her back on prednisone for appetite and energy She is on the taper course right now  I have reviewed her chart and materials related to her cancer extensively and collaborated history with the patient. Summary of oncologic history is as follows: Oncology History  Ovarian ca ([place])  07/22/2020 Imaging   Ct abdomen and pelvis 1. Large calcified mass in the pelvis that is new from 2010. There is associated large volume ascites with mild peritoneal nodularity/calcification, primarily worrisome for a calcifying ovarian malignancy. 2. Small right pleural effusion with few calcifications in the anterior juxta diaphragmatic space, possibly related to #1. 3. Nonenlarged but calcified periaortic lymph nodes which are also worrisome for spread of disease.   07/29/2020 Procedure   Successful ultrasound-guided diagnostic and therapeutic paracentesis yielding  5 liters of peritoneal fluid.   07/29/2020 Tumor Marker   Patient's tumor was tested for the following markers: CA-125 Results of the tumor marker test revealed 771   07/30/2020 Initial Diagnosis   Ovarian ca ([place])   07/31/2020 Imaging   1. Calcified periesophageal and juxtadiaphragmatic nodules are indicative of metastatic disease. 2. Small right and trace left pleural effusions. 3. Ascites and calcified upper abdominal adenopathy/peritoneal nodules, better assessed on 07/22/2020. 4. Aortic atherosclerosis (ICD10-I70.0). Coronary artery calcification.   07/31/2020 Cancer Staging   Staging form: Ovary, Fallopian Tube, and Primary Peritoneal Carcinoma, AJCC 8th Edition - Clinical stage from 07/31/2020: Stage IV (cT3, cN1, cM1) - Signed by [name], MD on 07/31/2020 Stage prefix: Initial diagnosis   08/05/2020 -  Chemotherapy    Patient is on Treatment Plan: OVARIAN CARBOPLATIN (AUC 6) / PACLITAXEL (175) Q21D X 6 CYCLES        MEDICAL HISTORY:  Past Medical History:  Diagnosis Date  . Arthritis    right shoulder replacement, right replacement   . Atrial fibrillation ([place])   . Chronic pancreatitis ([place])   . Constipation   . Osteopenia   . Pancreatitis 1990s  . Syncope 03/2011    SURGICAL HISTORY: Past Surgical History:  Procedure Laterality Date  . APPENDECTOMY  1955  . CHOLECYSTECTOMY  1998  . CYSTECTOMY  1981   [place]  . JOINT REPLACEMENT  right shoulder 2008; right hip 2016  . [place]  . TOTAL HIP ARTHROPLASTY Right 11/2014   [facility], [place], [place]    SOCIAL HISTORY: Social History   Socioeconomic History  . Marital status: Married    Spouse name: Not on file  . Number of children: 2  . Years of education: 27  . Highest education level: Some college, no degree  Occupational History  . Occupation: retired  Tobacco Use  . Smoking status: Former Smoker    Packs/day: 2.50    Types: Cigarettes    Quit date: 1980    Years  since quitting: 42.1  . Smokeless tobacco: Never Used  Vaping Use  . Vaping Use: Never used  Substance and Sexual Activity  . Alcohol use: Not Currently  . Drug use: Never  . Sexual activity: Not on file  Other Topics Concern  . Not on file  Social History Narrative   Lives at home with husband   Social Determinants of Health   Financial Resource Strain: Not on file  Food Insecurity: Not on file  Transportation Needs: Not on file  Physical Activity: Not on file  Stress: Not on file  Social Connections: Not on file  Intimate Partner Violence: Not on file    FAMILY HISTORY: Family History  Problem Relation Age of Onset  . Dementia Mother   . Memory loss Brother   . Hodgkin's lymphoma Father   . Kidney failure Brother   . Cancer Paternal Grandfather        unknown  . Breast cancer Maternal Aunt     ALLERGIES:  is allergic to cephalexin, hydrocodone-guaifenesin, tape, tramadol, and tramadol hcl.  MEDICATIONS:  Current Outpatient Medications  Medication Sig Dispense Refill  . dexamethasone (DECADRON) 4 MG tablet Take 2 tabs at the night before and 2 tab the morning of chemotherapy, every 3 weeks, by mouth x 6 cycles 36 tablet 6  . acetaminophen (TYLENOL) 500 MG chewable tablet Chew 500 mg by mouth. Takes up to 6 a day.    Marland Kitchen CALCIUM PO Take 1,000 mg by mouth daily as needed.    . Cholecalciferol (VITAMIN D3 PO) Take 2,000 Units by mouth daily.     . Coenzyme Q10 (CO Q 10 PO) Take 300 mg by mouth daily.     . magnesium oxide (MAG-OX) 400 MG tablet Take by mouth daily.     . Melatonin 1 MG CAPS Take 5 mg by mouth at bedtime as needed.     . Multiple Vitamin (MULTI-VITAMINS) TABS Take 1 tablet by mouth daily.    . ondansetron (ZOFRAN) 8 MG tablet Take 1 tablet (8 mg total) by mouth every 8 (eight) hours as needed for refractory nausea / vomiting. Start on day 3 after carboplatin chemo. 30 tablet 1  . pravastatin (PRAVACHOL) 20 MG tablet Take 1 tablet (20 mg total) by mouth  daily. 90 tablet 3  . predniSONE (DELTASONE) 1 MG tablet Take 9 tablets by mouth daily. 07/25/2020-Titrating down over the next 2 weeks    . prochlorperazine (COMPAZINE) 10 MG tablet Take 1 tablet (10 mg total) by mouth every 6 (six) hours as needed (Nausea or vomiting). 30 tablet 1   No current facility-administered medications for this visit.    REVIEW OF SYSTEMS:   Constitutional: Denies fevers, chills or abnormal night sweats Eyes: Denies blurriness of vision, double vision or watery eyes Ears, nose, mouth, throat, and face: Denies mucositis or sore throat Respiratory:  Denies cough, dyspnea or wheezes Cardiovascular: Denies palpitation, chest discomfort or lower extremity swelling Skin: Denies abnormal skin rashes Lymphatics: Denies new lymphadenopathy or easy bruising Neurological:Denies numbness, tingling or new weaknesses Behavioral/Psych: Mood is stable, no new changes  All other systems were reviewed with the patient and are negative.  PHYSICAL EXAMINATION: ECOG PERFORMANCE STATUS: 2 - Symptomatic, <50% confined to bed  Vitals:   07/30/20 1343  BP: (!) 123/55  Pulse: (!) 56  Resp: 18  Temp: 98.1 F (36.7 C)  SpO2: 98%   Filed Weights   07/30/20 1343  Weight: 148 lb 12.8 oz (67.5 kg)    GENERAL:alert, no distress and comfortable SKIN: skin color, texture, turgor are normal, no rashes or significant lesions. Noted loose skin folds EYES: normal, conjunctiva are pink and non-injected, sclera clear OROPHARYNX:no exudate, no erythema and lips, buccal mucosa, and tongue normal  NECK: supple, thyroid normal size, non-tender, without nodularity LYMPH:  no palpable lymphadenopathy in the cervical, axillary or inguinal LUNGS: clear to auscultation and percussion with normal breathing effort HEART: regular rate & rhythm and no murmurs and no lower extremity edema ABDOMEN:abdomen soft, non-tender and normal bowel sounds. Distended with ascites Musculoskeletal:no cyanosis of  digits and no clubbing  PSYCH: alert & oriented x 3 with fluent speech NEURO: no focal motor/sensory deficits  LABORATORY DATA:  I have reviewed the data as listed Lab Results  Component Value Date   WBC 6.3 11/17/2010   HGB 15.3 (H) 11/17/2010   HCT 45.0 11/17/2010   MCV 79.0 11/17/2010   PLT 148 (L) 11/17/2010   Recent Labs    07/29/20 1330  NA 141  K 4.3  CL 106  CO2 27  GLUCOSE 138*  BUN 29*  CREATININE 1.02*  CALCIUM 8.8*  GFRNONAA 55*    RADIOGRAPHIC STUDIES: I have personally reviewed the radiological images as listed and agreed with the findings in the report. CT CHEST W CONTRAST  Result Date: 07/31/2020 CLINICAL DATA:  Ovarian cancer staging. EXAM: CT CHEST WITH CONTRAST TECHNIQUE: Multidetector CT imaging of the chest was performed during intravenous contrast administration. CONTRAST:  76mL OMNIPAQUE IOHEXOL 300 MG/ML  SOLN COMPARISON:  CT abdomen pelvis 07/22/2020. FINDINGS: Cardiovascular: Atherosclerotic calcification of the aorta, aortic valve and coronary arteries. Heart is mildly enlarged. No pericardial effusion. Mediastinum/Nodes: Mediastinal lymph nodes are not enlarged by CT size criteria. No hilar or axillary adenopathy. Esophagus is grossly unremarkable. There are subcentimeter calcified nodules in the periesophageal and juxtadiaphragmatic fat. Lungs/Pleura: No suspicious pulmonary nodules. Small right and trace left pleural effusion. Airway is unremarkable. Upper Abdomen: Low-attenuation lesions in the liver measure up to 1.4 cm in the posterior right hepatic lobe, as before. Visualized portions of the liver, adrenal glands and right kidney are otherwise unremarkable. 3.2 cm low-density cyst in the upper pole left kidney. Visualized portions of the spleen, pancreas, stomach and bowel are grossly unremarkable with the exception of a small hiatal hernia. Large ascites. Scattered calcified lymph nodes and peritoneal nodules. Musculoskeletal: Degenerative changes in  the spine and left shoulder. Right shoulder arthroplasty. No worrisome lytic or sclerotic lesions. IMPRESSION: 1. Calcified periesophageal and juxtadiaphragmatic nodules are indicative of metastatic disease. 2. Small right and trace left pleural effusions. 3. Ascites and calcified upper abdominal adenopathy/peritoneal nodules, better assessed on 07/22/2020. 4. Aortic atherosclerosis (ICD10-I70.0). Coronary artery calcification. Electronically Signed   By: Lorin Picket M.D.   On: 07/31/2020 09:01   CT ABDOMEN PELVIS W CONTRAST  Result Date: 07/22/2020 CLINICAL DATA:  Abdominal mass.  Asymptomatic EXAM: CT ABDOMEN AND PELVIS WITH CONTRAST TECHNIQUE: Multidetector CT imaging of the abdomen and pelvis was performed using the standard protocol following bolus administration of intravenous contrast. CONTRAST:  168mL OMNIPAQUE IOHEXOL 300 MG/ML  SOLN COMPARISON:  10/26/2008 abdominal CT FINDINGS: Lower chest: Small right pleural effusion which appears simple and low-density. There are calcified nodules at the anterior juxta diaphragmatic space which measure up to 6 cm. Hepatobiliary: No signs of cirrhosis. There are a few areas of subcapsular low-density that are stable from 2010 and benign. Limited calcifications seen along the falciform ligament.Cholecystectomy without worrisome bile duct dilatation. Pancreas: Generalized atrophy. Spleen: Area of irregular hypoenhancement in the subcapsular posterior spleen of doubtful significance. Capsule does not appear scalloped at this level. Adrenals/Urinary Tract: Negative adrenals. No hydronephrosis or stone. Left upper pole cyst measuring up to 3.2 cm. Unremarkable bladder. Stomach/Bowel: Negative for bowel obstruction. Left colonic diverticulosis. Small sliding hiatal hernia. Vascular/Lymphatic: Mild for age atheromatous calcification. No enlarged retroperitoneal lymph nodes but there are a few calcified nodes in the periaortic station. Reproductive:Calcified mass in the  pelvis with dominant nearly completely calcified component measuring 14 cm in transverse span. The calcification show cloudlike and nodular morphology. Adjacent to this mass is a right and posterior solid and cystic mass measuring 6 cm, with solid components being densely calcification. There is large volume ascites with areas of faint peritoneal thickening in the gutters and occasional calcifications seen best at the level of the greater omentum. Findings are most concerning for an ovarian malignancy, especially serous type. Other: No pneumoperitoneum. Musculoskeletal: Right hip arthroplasty and lumbar spine degeneration. 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 Internal lipoma architecture remains benign. These results will be called to the ordering clinician or representative by the Radiologist Assistant, and communication documented in the PACS or [employer]. IMPRESSION: 1. Large calcified mass in the pelvis that is new from 2010. There is associated large volume ascites with mild peritoneal nodularity/calcification, primarily worrisome for a calcifying ovarian malignancy. 2. Small right pleural effusion with few calcifications in the anterior juxta diaphragmatic space, possibly related to #1. 3. Nonenlarged but calcified periaortic lymph nodes which are also worrisome for spread of disease. Electronically Signed   By: Monte Fantasia M.D.   On: 07/22/2020 10:47   US Paracentesis  Result Date: 07/29/2020 INDICATION: Patient with history of pelvic mass, peritoneal nodularity, ascites ; request received for diagnostic and therapeutic paracentesis up to 5 liters. EXAM: ULTRASOUND GUIDED DIAGNOSTIC AND THERAPEUTIC PARACENTESIS MEDICATIONS: 1% lidocaine to skin and subcutaneous tissue COMPLICATIONS: None immediate. PROCEDURE: Informed written consent was obtained from the patient after a discussion of the risks, benefits and  alternatives to treatment. A timeout was performed prior to the initiation of the procedure. Initial ultrasound scanning demonstrates a large amount of ascites within the right lower abdominal quadrant. The right lower abdomen was prepped and draped in the usual sterile fashion. 1% lidocaine was used for local anesthesia. Following this, a 19 gauge, 7-cm, Yueh catheter was introduced. An ultrasound image was saved for documentation purposes. The paracentesis was performed. The catheter was removed and a dressing was applied. The patient tolerated the procedure well without immediate post procedural complication. FINDINGS: A total of approximately 5 liters of yellow fluid was removed. Samples were sent to the laboratory as requested by the clinical team. IMPRESSION: Successful ultrasound-guided diagnostic and therapeutic paracentesis yielding 5 liters of peritoneal fluid. Read by: Rowe Robert, PA-C  Electronically Signed   By: Miachel Roux M.D.   On: 07/29/2020 15:05   VAS [place] LOWER EXTREMITY VENOUS (DVT)  Result Date: 07/19/2020  Lower Venous DVT Study Indications: Pain, and Swelling.  Risk Factors: None identified. Limitations: Poor ultrasound/tissue interface. Comparison Study: No prior studies. Performing Technologist: Oliver Hum RVT  Examination Guidelines: A complete evaluation includes B-mode imaging, spectral Doppler, color Doppler, and power Doppler as needed of all accessible portions of each vessel. Bilateral testing is considered an integral part of a complete examination. Limited examinations for reoccurring indications may be performed as noted. The reflux portion of the exam is performed with the patient in reverse Trendelenburg.  +---------+---------------+---------+-----------+----------+--------------+ RIGHT    CompressibilityPhasicitySpontaneityPropertiesThrombus Aging +---------+---------------+---------+-----------+----------+--------------+ CFV      Full           Yes      Yes                                  +---------+---------------+---------+-----------+----------+--------------+ SFJ      Full                                                        +---------+---------------+---------+-----------+----------+--------------+ FV Prox  Full                                                        +---------+---------------+---------+-----------+----------+--------------+ FV Mid   Full                                                        +---------+---------------+---------+-----------+----------+--------------+ FV DistalFull                                                        +---------+---------------+---------+-----------+----------+--------------+ PFV      Full                                                        +---------+---------------+---------+-----------+----------+--------------+ POP      Full           Yes      Yes                                 +---------+---------------+---------+-----------+----------+--------------+ PTV      Full                                                        +---------+---------------+---------+-----------+----------+--------------+  PERO     Full                                                        +---------+---------------+---------+-----------+----------+--------------+   +---------+---------------+---------+-----------+----------+--------------+ LEFT     CompressibilityPhasicitySpontaneityPropertiesThrombus Aging +---------+---------------+---------+-----------+----------+--------------+ CFV      Full           Yes      Yes                                 +---------+---------------+---------+-----------+----------+--------------+ SFJ      Full                                                        +---------+---------------+---------+-----------+----------+--------------+ FV Prox  Full                                                         +---------+---------------+---------+-----------+----------+--------------+ FV Mid   Full                                                        +---------+---------------+---------+-----------+----------+--------------+ FV DistalFull                                                        +---------+---------------+---------+-----------+----------+--------------+ PFV      Full                                                        +---------+---------------+---------+-----------+----------+--------------+ POP      Full           Yes      Yes                                 +---------+---------------+---------+-----------+----------+--------------+ PTV      Full                                                        +---------+---------------+---------+-----------+----------+--------------+ PERO     Full                                                        +---------+---------------+---------+-----------+----------+--------------+  Summary: RIGHT: - There is no evidence of deep vein thrombosis in the lower extremity.  - No cystic structure found in the popliteal fossa.  LEFT: - There is no evidence of deep vein thrombosis in the lower extremity.  - No cystic structure found in the popliteal fossa.  *See table(s) above for measurements and observations. Electronically signed by Deitra Mayo MD on 07/19/2020 at 6:21:48 PM.    Final

## 2020-08-01 ENCOUNTER — Other Ambulatory Visit: Payer: Self-pay

## 2020-08-01 ENCOUNTER — Inpatient Hospital Stay: Payer: Medicare Other

## 2020-08-01 ENCOUNTER — Telehealth: Payer: Self-pay

## 2020-08-01 ENCOUNTER — Telehealth: Payer: Self-pay | Admitting: Oncology

## 2020-08-01 DIAGNOSIS — N183 Chronic kidney disease, stage 3 unspecified: Secondary | ICD-10-CM | POA: Diagnosis not present

## 2020-08-01 DIAGNOSIS — R180 Malignant ascites: Secondary | ICD-10-CM | POA: Diagnosis not present

## 2020-08-01 DIAGNOSIS — C569 Malignant neoplasm of unspecified ovary: Secondary | ICD-10-CM

## 2020-08-01 DIAGNOSIS — Z5111 Encounter for antineoplastic chemotherapy: Secondary | ICD-10-CM | POA: Diagnosis not present

## 2020-08-01 DIAGNOSIS — J90 Pleural effusion, not elsewhere classified: Secondary | ICD-10-CM | POA: Diagnosis not present

## 2020-08-01 DIAGNOSIS — I480 Paroxysmal atrial fibrillation: Secondary | ICD-10-CM | POA: Diagnosis not present

## 2020-08-01 LAB — CMP (CANCER CENTER ONLY)
ALT: 11 U/L (ref 0–44)
AST: 13 U/L — ABNORMAL LOW (ref 15–41)
Albumin: 2.8 g/dL — ABNORMAL LOW (ref 3.5–5.0)
Alkaline Phosphatase: 63 U/L (ref 38–126)
Anion gap: 8 (ref 5–15)
BUN: 32 mg/dL — ABNORMAL HIGH (ref 8–23)
CO2: 28 mmol/L (ref 22–32)
Calcium: 8.8 mg/dL — ABNORMAL LOW (ref 8.9–10.3)
Chloride: 106 mmol/L (ref 98–111)
Creatinine: 0.83 mg/dL (ref 0.44–1.00)
GFR, Estimated: >60 mL/min (ref >60)
Glucose, Bld: 117 mg/dL — ABNORMAL HIGH (ref 70–99)
Potassium: 4.9 mmol/L (ref 3.5–5.1)
Sodium: 142 mmol/L (ref 135–145)
Total Bilirubin: 0.3 mg/dL (ref 0.3–1.2)
Total Protein: 5.8 g/dL — ABNORMAL LOW (ref 6.5–8.1)

## 2020-08-01 LAB — CBC WITH DIFFERENTIAL (CANCER CENTER ONLY)
Abs Immature Granulocytes: 0.04 10*3/uL (ref 0.00–0.07)
Basophils Absolute: 0 10*3/uL (ref 0.0–0.1)
Basophils Relative: 0 %
Eosinophils Absolute: 0.1 10*3/uL (ref 0.0–0.5)
Eosinophils Relative: 1 %
HCT: 38.4 % (ref 36.0–46.0)
Hemoglobin: 12.4 g/dL (ref 12.0–15.0)
Immature Granulocytes: 1 %
Lymphocytes Relative: 8 %
Lymphs Abs: 0.6 10*3/uL — ABNORMAL LOW (ref 0.7–4.0)
MCH: 28.1 pg (ref 26.0–34.0)
MCHC: 32.3 g/dL (ref 30.0–36.0)
MCV: 87.1 fL (ref 80.0–100.0)
Monocytes Absolute: 0.4 10*3/uL (ref 0.1–1.0)
Monocytes Relative: 4 %
Neutro Abs: 7.4 10*3/uL (ref 1.7–7.7)
Neutrophils Relative %: 86 %
Platelet Count: 282 10*3/uL (ref 150–400)
RBC: 4.41 MIL/uL (ref 3.87–5.11)
RDW: 13.4 % (ref 11.5–15.5)
WBC Count: 8.6 10*3/uL (ref 4.0–10.5)
nRBC: 0 % (ref 0.0–0.2)

## 2020-08-01 LAB — CYTOLOGY - NON PAP

## 2020-08-01 NOTE — Telephone Encounter (Signed)
Husband and patient called to review upcoming appointments. Reviewed new medications. They will bring new Rx's to education class and pick up today at pharmacy.

## 2020-08-01 NOTE — Telephone Encounter (Signed)
Called [name] and advised him that his daughter, [name], can attend the Patient Education class this afternoon.

## 2020-08-02 ENCOUNTER — Encounter: Payer: Self-pay | Admitting: Hematology and Oncology

## 2020-08-02 ENCOUNTER — Other Ambulatory Visit: Payer: Self-pay | Admitting: Hematology and Oncology

## 2020-08-02 LAB — CA 125: Cancer Antigen (CA) 125: 641 U/mL — ABNORMAL HIGH (ref 0.0–38.1)

## 2020-08-02 NOTE — Progress Notes (Signed)
Called pt to introduce myself as her Arboriculturist and to discuss the [employer].  Pt has 2 insurances so copay assistance shouldn't be needed.  Pt was sleeping so I spoke w/ her husband and informed him of the [employer] and went over what it covers.  He stated pt may want to apply so I need to speak to her about it so he took my contact information.  I will await her call.

## 2020-08-02 NOTE — Progress Notes (Signed)
Pt called me back so I informed her of the [employer], went over what it covers and gave her the income requirement.  She would like to apply so she will bring proof of income on 08/05/20.  If approved I will give her an expense sheet and my card for any questions or concerns she may have in the future.

## 2020-08-04 ENCOUNTER — Ambulatory Visit (HOSPITAL_COMMUNITY)
Admission: EM | Admit: 2020-08-04 | Discharge: 2020-08-04 | Disposition: A | Discharge status: Home / Self Care | Payer: Medicare Other | Attending: Emergency Medicine | Admitting: Emergency Medicine

## 2020-08-04 ENCOUNTER — Other Ambulatory Visit: Payer: Self-pay

## 2020-08-04 ENCOUNTER — Encounter (HOSPITAL_COMMUNITY): Payer: Self-pay | Admitting: *Deleted

## 2020-08-04 DIAGNOSIS — H1132 Conjunctival hemorrhage, left eye: Secondary | ICD-10-CM | POA: Diagnosis not present

## 2020-08-04 NOTE — ED Provider Notes (Signed)
HPI  SUBJECTIVE:  [name] is a 80 y.o. female who presents with left conjunctival injection after rubbing her eye last night.  States that she felt "something in her eye" and rubbed it, trying to get it out.  She states that she noticed the erythema several hours later.  She denies any residual foreign body sensation. No Nausea, vomiting, blurry vision, double vision, visual loss, eye pain, other trauma to the eye.  No aggravating or alleviating factors.  She has not tried anything for this.  She wears glasses.  She does not wear contacts.  She has a past medical history of atrial fibrillation, but is not on any anticoagulants.  She also has a history of chronic kidney disease stage III, polymyalgia rheumatica, ovarian cancer.  She is starting chemo tomorrow.  URK:YHCW, Dwyane Luo, MD Ophthalmology: None.    Past Medical History:  Diagnosis Date  . Arthritis    right shoulder replacement, right replacement   . Atrial fibrillation ([place])   . Chronic pancreatitis ([place])   . Constipation   . Osteopenia   . Pancreatitis 1990s  . Syncope 03/2011    Past Surgical History:  Procedure Laterality Date  . APPENDECTOMY  1955  . CHOLECYSTECTOMY  1998  . CYSTECTOMY  1981   [facility]  . JOINT REPLACEMENT     right shoulder 2008; right hip 2016  . [place]  . TOTAL HIP ARTHROPLASTY Right 11/2014   [facility], [place], [place]    Family History  Problem Relation Age of Onset  . Dementia Mother   . Memory loss Brother   . Hodgkin's lymphoma Father   . Kidney failure Brother   . Cancer Paternal Grandfather        unknown  . Breast cancer Maternal Aunt     Social History   Tobacco Use  . Smoking status: Former Smoker    Packs/day: 2.50    Types: Cigarettes    Quit date: 1980    Years since quitting: 42.1  . Smokeless tobacco: Never Used  Vaping Use  . Vaping Use: Never used  Substance Use Topics  . Alcohol use: Not Currently  . Drug use: Never     No current facility-administered medications for this encounter.  Current Outpatient Medications:  .  acetaminophen (TYLENOL) 500 MG chewable tablet, Chew 500 mg by mouth. Takes up to 6 a day., Disp: , Rfl:  .  CALCIUM PO, Take 1,000 mg by mouth daily as needed., Disp: , Rfl:  .  Cholecalciferol (VITAMIN D3 PO), Take 2,000 Units by mouth daily. , Disp: , Rfl:  .  Coenzyme Q10 (CO Q 10 PO), Take 300 mg by mouth daily. , Disp: , Rfl:  .  dexamethasone (DECADRON) 4 MG tablet, Take 2 tabs at the night before and 2 tab the morning of chemotherapy, every 3 weeks, by mouth x 6 cycles, Disp: 36 tablet, Rfl: 6 .  magnesium oxide (MAG-OX) 400 MG tablet, Take by mouth daily. , Disp: , Rfl:  .  Melatonin 1 MG CAPS, Take 5 mg by mouth at bedtime as needed. , Disp: , Rfl:  .  Multiple Vitamin (MULTI-VITAMINS) TABS, Take 1 tablet by mouth daily., Disp: , Rfl:  .  ondansetron (ZOFRAN) 8 MG tablet, Take 1 tablet (8 mg total) by mouth every 8 (eight) hours as needed for refractory nausea / vomiting. Start on day 3 after carboplatin chemo., Disp: 30 tablet, Rfl: 1 .  pravastatin (PRAVACHOL) 20  MG tablet, Take 1 tablet (20 mg total) by mouth daily., Disp: 90 tablet, Rfl: 3 .  predniSONE (DELTASONE) 1 MG tablet, Take 9 tablets by mouth daily. 07/25/2020-Titrating down over the next 2 weeks, Disp: , Rfl:  .  prochlorperazine (COMPAZINE) 10 MG tablet, Take 1 tablet (10 mg total) by mouth every 6 (six) hours as needed (Nausea or vomiting)., Disp: 30 tablet, Rfl: 1  Allergies  Allergen Reactions  . Cephalexin Other (See Comments)    dehydrated  . Hydrocodone-Guaifenesin     Dizzy  Other reaction(s): dizzy  . Tape Other (See Comments)    Surgical tape Other reaction(s): Unknown  . Tramadol     constipation  . Tramadol Hcl     Other reaction(s): Constipation     ROS  As noted in HPI.   Physical Exam  BP 119/67 (BP Location: Right Arm)   Pulse 65   Temp 98.2 F (36.8 C) (Oral)   Resp 18   SpO2  96%   Constitutional: Well developed, well nourished, no acute distress Eyes: PERRLA, EOMI, large subconjunctival hemorrhage medial and lateral left conjunctiva.  No Direct or consensual photophobia.  No hyphema.  No corneal foreign body seen on magnification.  No corneal abrasion flourescin exam   Visual Acuity  Right Eye Distance: 20/30 with correction Left Eye Distance: 20/20 with correction Bilateral Distance: 20/30 with correction  Right Eye Near:   Left Eye Near:    Bilateral Near:    HENT: Normocephalic, atraumatic,mucus membranes moist Respiratory: Normal inspiratory effort Cardiovascular: Normal rate GI: nondistended skin: No rash, skin intact Musculoskeletal: no deformities Neurologic: Alert & oriented x 3, no focal neuro deficits Psychiatric: Speech and behavior appropriate   ED Course   Medications - No data to display  No orders of the defined types were placed in this encounter.   No results found for this or any previous visit (from the past 24 hour(s)). No results found.  ED Clinical Impression  1. Subconjunctival hemorrhage of left eye      ED Assessment/Plan  Patient with a large left subconjunctival hemorrhage.  Advised her that there is not much to do for this other than warm compresses.  We will have her follow-up with Dr. Baird Cancer, ophthalmology on-call, if she starts having eye pain or any other concerns.  Discussed  MDM, treatment plan, and plan for follow-up with patient.patient agrees with plan.   No orders of the defined types were placed in this encounter.   *This clinic note was created using Dragon dictation software. Therefore, there may be occasional mistakes despite careful proofreading.   ?    Melynda Ripple, MD 08/04/20 1446

## 2020-08-04 NOTE — ED Triage Notes (Signed)
Pt reports Lt eye was red last night . Pt does not report any injury.

## 2020-08-04 NOTE — Discharge Instructions (Addendum)
Warm compresses. This will take several weeks to resolve.

## 2020-08-05 ENCOUNTER — Other Ambulatory Visit: Payer: Self-pay

## 2020-08-05 ENCOUNTER — Inpatient Hospital Stay: Payer: Medicare Other

## 2020-08-05 ENCOUNTER — Encounter: Payer: Self-pay | Admitting: Hematology and Oncology

## 2020-08-05 VITALS — BP 139/69 | HR 59 | Temp 98.5°F | Resp 18 | Wt 150.0 lb

## 2020-08-05 DIAGNOSIS — C569 Malignant neoplasm of unspecified ovary: Secondary | ICD-10-CM

## 2020-08-05 DIAGNOSIS — J90 Pleural effusion, not elsewhere classified: Secondary | ICD-10-CM | POA: Diagnosis not present

## 2020-08-05 DIAGNOSIS — R180 Malignant ascites: Secondary | ICD-10-CM | POA: Diagnosis not present

## 2020-08-05 DIAGNOSIS — N183 Chronic kidney disease, stage 3 unspecified: Secondary | ICD-10-CM | POA: Diagnosis not present

## 2020-08-05 DIAGNOSIS — Z5111 Encounter for antineoplastic chemotherapy: Secondary | ICD-10-CM | POA: Diagnosis not present

## 2020-08-05 DIAGNOSIS — I480 Paroxysmal atrial fibrillation: Secondary | ICD-10-CM | POA: Diagnosis not present

## 2020-08-05 MED ORDER — PALONOSETRON HCL INJECTION 0.25 MG/5ML
INTRAVENOUS | Status: AC
  Filled 2020-08-05: qty 5

## 2020-08-05 MED ORDER — FAMOTIDINE IN NACL 20-0.9 MG/50ML-% IV SOLN
20.0000 mg | Freq: Once | INTRAVENOUS | Status: AC
  Administered 2020-08-05: 20 mg via INTRAVENOUS

## 2020-08-05 MED ORDER — PALONOSETRON HCL INJECTION 0.25 MG/5ML
0.2500 mg | Freq: Once | INTRAVENOUS | Status: AC
  Administered 2020-08-05: 0.25 mg via INTRAVENOUS

## 2020-08-05 MED ORDER — SODIUM CHLORIDE 0.9 % IV SOLN
175.0000 mg/m2 | Freq: Once | INTRAVENOUS | Status: AC
  Administered 2020-08-05: 300 mg via INTRAVENOUS
  Filled 2020-08-05: qty 50

## 2020-08-05 MED ORDER — DEXAMETHASONE SODIUM PHOSPHATE 100 MG/10ML IJ SOLN
10.0000 mg | Freq: Once | INTRAMUSCULAR | Status: AC
  Administered 2020-08-05: 10 mg via INTRAVENOUS
  Filled 2020-08-05: qty 10

## 2020-08-05 MED ORDER — DIPHENHYDRAMINE HCL 50 MG/ML IJ SOLN
INTRAMUSCULAR | Status: AC
  Filled 2020-08-05: qty 1

## 2020-08-05 MED ORDER — SODIUM CHLORIDE 0.9 % IV SOLN
150.0000 mg | Freq: Once | INTRAVENOUS | Status: AC
  Administered 2020-08-05: 150 mg via INTRAVENOUS
  Filled 2020-08-05: qty 150

## 2020-08-05 MED ORDER — CARBOPLATIN CHEMO INJECTION 600 MG/60ML
432.0000 mg | Freq: Once | INTRAVENOUS | Status: AC
  Administered 2020-08-05: 430 mg via INTRAVENOUS
  Filled 2020-08-05: qty 43

## 2020-08-05 MED ORDER — DIPHENHYDRAMINE HCL 50 MG/ML IJ SOLN
12.5000 mg | Freq: Once | INTRAMUSCULAR | Status: AC
  Administered 2020-08-05: 12.5 mg via INTRAVENOUS

## 2020-08-05 MED ORDER — FAMOTIDINE IN NACL 20-0.9 MG/50ML-% IV SOLN
INTRAVENOUS | Status: AC
  Filled 2020-08-05: qty 50

## 2020-08-05 MED ORDER — SODIUM CHLORIDE 0.9 % IV SOLN
Freq: Once | INTRAVENOUS | Status: AC
  Filled 2020-08-05: qty 250

## 2020-08-05 NOTE — Patient Instructions (Signed)
Carboplatin injection What is this medicine? CARBOPLATIN (KAR boe pla tin) is a chemotherapy drug. It targets fast dividing cells, like cancer cells, and causes these cells to die. This medicine is used to treat ovarian cancer and many other cancers. This medicine may be used for other purposes; ask your health care provider or pharmacist if you have questions. COMMON BRAND NAME(S): Paraplatin What should I tell my health care provider before I take this medicine? They need to know if you have any of these conditions:  blood disorders  hearing problems  kidney disease  recent or ongoing radiation therapy  an unusual or allergic reaction to carboplatin, cisplatin, other chemotherapy, other medicines, foods, dyes, or preservatives  pregnant or trying to get pregnant  breast-feeding How should I use this medicine? This drug is usually given as an infusion into a vein. It is administered in a hospital or clinic by a specially trained health care professional. Talk to your pediatrician regarding the use of this medicine in children. Special care may be needed. Overdosage: If you think you have taken too much of this medicine contact a poison control center or emergency room at once. NOTE: This medicine is only for you. Do not share this medicine with others. What if I miss a dose? It is important not to miss a dose. Call your doctor or health care professional if you are unable to keep an appointment. What may interact with this medicine?  medicines for seizures  medicines to increase blood counts like filgrastim, pegfilgrastim, sargramostim  some antibiotics like amikacin, gentamicin, neomycin, streptomycin, tobramycin  vaccines Talk to your doctor or health care professional before taking any of these medicines:  acetaminophen  aspirin  ibuprofen  ketoprofen  naproxen This list may not describe all possible interactions. Give your health care provider a list of all the  medicines, herbs, non-prescription drugs, or dietary supplements you use. Also tell them if you smoke, drink alcohol, or use illegal drugs. Some items may interact with your medicine. What should I watch for while using this medicine? Your condition will be monitored carefully while you are receiving this medicine. You will need important blood work done while you are taking this medicine. This drug may make you feel generally unwell. This is not uncommon, as chemotherapy can affect healthy cells as well as cancer cells. Report any side effects. Continue your course of treatment even though you feel ill unless your doctor tells you to stop. In some cases, you may be given additional medicines to help with side effects. Follow all directions for their use. Call your doctor or health care professional for advice if you get a fever, chills or sore throat, or other symptoms of a cold or flu. Do not treat yourself. This drug decreases your body's ability to fight infections. Try to avoid being around people who are sick. This medicine may increase your risk to bruise or bleed. Call your doctor or health care professional if you notice any unusual bleeding. Be careful brushing and flossing your teeth or using a toothpick because you may get an infection or bleed more easily. If you have any dental work done, tell your dentist you are receiving this medicine. Avoid taking products that contain aspirin, acetaminophen, ibuprofen, naproxen, or ketoprofen unless instructed by your doctor. These medicines may hide a fever. Do not become pregnant while taking this medicine. Women should inform their doctor if they wish to become pregnant or think they might be pregnant. There is a   potential for serious side effects to an unborn child. Talk to your health care professional or pharmacist for more information. Do not breast-feed an infant while taking this medicine. What side effects may I notice from receiving this  medicine? Side effects that you should report to your doctor or health care professional as soon as possible:  allergic reactions like skin rash, itching or hives, swelling of the face, lips, or tongue  signs of infection - fever or chills, cough, sore throat, pain or difficulty passing urine  signs of decreased platelets or bleeding - bruising, pinpoint red spots on the skin, black, tarry stools, nosebleeds  signs of decreased red blood cells - unusually weak or tired, fainting spells, lightheadedness  breathing problems  changes in hearing  changes in vision  chest pain  high blood pressure  low blood counts - This drug may decrease the number of white blood cells, red blood cells and platelets. You may be at increased risk for infections and bleeding.  nausea and vomiting  pain, swelling, redness or irritation at the injection site  pain, tingling, numbness in the hands or feet  problems with balance, talking, walking  trouble passing urine or change in the amount of urine Side effects that usually do not require medical attention (report to your doctor or health care professional if they continue or are bothersome):  hair loss  loss of appetite  metallic taste in the mouth or changes in taste This list may not describe all possible side effects. Call your doctor for medical advice about side effects. You may report side effects to FDA at 1-800-FDA-1088. Where should I keep my medicine? This drug is given in a hospital or clinic and will not be stored at home. NOTE: This sheet is a summary. It may not cover all possible information. If you have questions about this medicine, talk to your doctor, pharmacist, or health care provider.  2021 Elsevier/Gold Standard (2007-09-20 14:38:05) Paclitaxel injection What is this medicine? PACLITAXEL (PAK li TAX el) is a chemotherapy drug. It targets fast dividing cells, like cancer cells, and causes these cells to die. This  medicine is used to treat ovarian cancer, breast cancer, lung cancer, Kaposi's sarcoma, and other cancers. This medicine may be used for other purposes; ask your health care provider or pharmacist if you have questions. COMMON BRAND NAME(S): Onxol, Taxol What should I tell my health care provider before I take this medicine? They need to know if you have any of these conditions:  history of irregular heartbeat  liver disease  low blood counts, like low white cell, platelet, or red cell counts  lung or breathing disease, like asthma  tingling of the fingers or toes, or other nerve disorder  an unusual or allergic reaction to paclitaxel, alcohol, polyoxyethylated castor oil, other chemotherapy, other medicines, foods, dyes, or preservatives  pregnant or trying to get pregnant  breast-feeding How should I use this medicine? This drug is given as an infusion into a vein. It is administered in a hospital or clinic by a specially trained health care professional. Talk to your pediatrician regarding the use of this medicine in children. Special care may be needed. Overdosage: If you think you have taken too much of this medicine contact a poison control center or emergency room at once. NOTE: This medicine is only for you. Do not share this medicine with others. What if I miss a dose? It is important not to miss your dose. Call your doctor or   health care professional if you are unable to keep an appointment. What may interact with this medicine? Do not take this medicine with any of the following medications:  live virus vaccines This medicine may also interact with the following medications:  antiviral medicines for hepatitis, HIV or AIDS  certain antibiotics like erythromycin and clarithromycin  certain medicines for fungal infections like ketoconazole and itraconazole  certain medicines for seizures like carbamazepine, phenobarbital,  phenytoin  gemfibrozil  nefazodone  rifampin  St. John's wort This list may not describe all possible interactions. Give your health care provider a list of all the medicines, herbs, non-prescription drugs, or dietary supplements you use. Also tell them if you smoke, drink alcohol, or use illegal drugs. Some items may interact with your medicine. What should I watch for while using this medicine? Your condition will be monitored carefully while you are receiving this medicine. You will need important blood work done while you are taking this medicine. This medicine can cause serious allergic reactions. To reduce your risk you will need to take other medicine(s) before treatment with this medicine. If you experience allergic reactions like skin rash, itching or hives, swelling of the face, lips, or tongue, tell your doctor or health care professional right away. In some cases, you may be given additional medicines to help with side effects. Follow all directions for their use. This drug may make you feel generally unwell. This is not uncommon, as chemotherapy can affect healthy cells as well as cancer cells. Report any side effects. Continue your course of treatment even though you feel ill unless your doctor tells you to stop. Call your doctor or health care professional for advice if you get a fever, chills or sore throat, or other symptoms of a cold or flu. Do not treat yourself. This drug decreases your body's ability to fight infections. Try to avoid being around people who are sick. This medicine may increase your risk to bruise or bleed. Call your doctor or health care professional if you notice any unusual bleeding. Be careful brushing and flossing your teeth or using a toothpick because you may get an infection or bleed more easily. If you have any dental work done, tell your dentist you are receiving this medicine. Avoid taking products that contain aspirin, acetaminophen, ibuprofen,  naproxen, or ketoprofen unless instructed by your doctor. These medicines may hide a fever. Do not become pregnant while taking this medicine. Women should inform their doctor if they wish to become pregnant or think they might be pregnant. There is a potential for serious side effects to an unborn child. Talk to your health care professional or pharmacist for more information. Do not breast-feed an infant while taking this medicine. Men are advised not to father a child while receiving this medicine. This product may contain alcohol. Ask your pharmacist or healthcare provider if this medicine contains alcohol. Be sure to tell all healthcare providers you are taking this medicine. Certain medicines, like metronidazole and disulfiram, can cause an unpleasant reaction when taken with alcohol. The reaction includes flushing, headache, nausea, vomiting, sweating, and increased thirst. The reaction can last from 30 minutes to several hours. What side effects may I notice from receiving this medicine? Side effects that you should report to your doctor or health care professional as soon as possible:  allergic reactions like skin rash, itching or hives, swelling of the face, lips, or tongue  breathing problems  changes in vision  fast, irregular heartbeat  high or   low blood pressure  mouth sores  pain, tingling, numbness in the hands or feet  signs of decreased platelets or bleeding - bruising, pinpoint red spots on the skin, black, tarry stools, blood in the urine  signs of decreased red blood cells - unusually weak or tired, feeling faint or lightheaded, falls  signs of infection - fever or chills, cough, sore throat, pain or difficulty passing urine  signs and symptoms of liver injury like dark yellow or brown urine; general ill feeling or flu-like symptoms; light-colored stools; loss of appetite; nausea; right upper belly pain; unusually weak or tired; yellowing of the eyes or  skin  swelling of the ankles, feet, hands  unusually slow heartbeat Side effects that usually do not require medical attention (report to your doctor or health care professional if they continue or are bothersome):  diarrhea  hair loss  loss of appetite  muscle or joint pain  nausea, vomiting  pain, redness, or irritation at site where injected  tiredness This list may not describe all possible side effects. Call your doctor for medical advice about side effects. You may report side effects to FDA at 1-800-FDA-1088. Where should I keep my medicine? This drug is given in a hospital or clinic and will not be stored at home. NOTE: This sheet is a summary. It may not cover all possible information. If you have questions about this medicine, talk to your doctor, pharmacist, or health care provider.  2021 Elsevier/Gold Standard (2019-05-17 13:37:23)  

## 2020-08-05 NOTE — Progress Notes (Signed)
Pt is approved for the $1000 [employer] grant.  

## 2020-08-07 ENCOUNTER — Telehealth: Payer: Self-pay

## 2020-08-07 NOTE — Telephone Encounter (Signed)
Husband called and left a message to call her.  Called back. She started having massive gas pains and gas late yesterday. The gain pain and gas is better today after eating breakfast. She had chilli yesterday and then the gas pain started. Denies nausea. Last bm yesterday. Instructed to try to avoid gas causing foods and call the office back if needed. They both verbalized understanding.  FYI

## 2020-08-13 ENCOUNTER — Other Ambulatory Visit: Payer: Self-pay

## 2020-08-13 ENCOUNTER — Encounter: Payer: Self-pay | Admitting: Hematology and Oncology

## 2020-08-13 ENCOUNTER — Inpatient Hospital Stay (HOSPITAL_BASED_OUTPATIENT_CLINIC_OR_DEPARTMENT_OTHER): Payer: Medicare Other | Admitting: Hematology and Oncology

## 2020-08-13 DIAGNOSIS — J90 Pleural effusion, not elsewhere classified: Secondary | ICD-10-CM

## 2020-08-13 DIAGNOSIS — I480 Paroxysmal atrial fibrillation: Secondary | ICD-10-CM | POA: Diagnosis not present

## 2020-08-13 DIAGNOSIS — R180 Malignant ascites: Secondary | ICD-10-CM | POA: Diagnosis not present

## 2020-08-13 DIAGNOSIS — K5909 Other constipation: Secondary | ICD-10-CM | POA: Diagnosis not present

## 2020-08-13 DIAGNOSIS — C569 Malignant neoplasm of unspecified ovary: Secondary | ICD-10-CM

## 2020-08-13 DIAGNOSIS — Z5111 Encounter for antineoplastic chemotherapy: Secondary | ICD-10-CM | POA: Diagnosis not present

## 2020-08-13 DIAGNOSIS — N183 Chronic kidney disease, stage 3 unspecified: Secondary | ICD-10-CM | POA: Diagnosis not present

## 2020-08-13 NOTE — Assessment & Plan Note (Signed)
We discussed the importance of aggressive laxative therapy 

## 2020-08-13 NOTE — Progress Notes (Signed)
[facility] OFFICE PROGRESS NOTE  Patient Care Team: [name], MD as PCP - General (Family Medicine) [name], MD as Consulting Physician (Cardiology)  ASSESSMENT & PLAN:  Ovarian ca [facility]) Clinically, she appears to be responding to treatment Her increased belching could still represent positive response to therapy I plan to see her again next week for further follow-up and supportive care  Ascites She has malignant ascites on exam Currently, she is relatively asymptomatic We discussed the risk and benefits of therapeutic paracentesis For now, I recommend close surveillance I plan to examine her again next week and if her symptoms are worse, we can order therapeutic paracentesis  Pleural effusion on right She has minimum pleural effusion noted Observe for now  Constipation We discussed the importance of aggressive laxative therapy   No orders of the defined types were placed in this encounter.   All questions were answered. The patient knows to call the clinic with any problems, questions or concerns. The total time spent in the appointment was 20 minutes encounter with patients including review of chart and various tests results, discussions about plan of care and coordination of care plan   [name], MD 08/13/2020 1:02 PM  INTERVAL HISTORY: Please see below for problem oriented charting. She returns with her husband for further follow-up She tolerated chemotherapy well No nausea She has minimum constipation She has increased belching lately Denies shortness of breath She has some fatigue by day 3 of chemotherapy No peripheral neuropathy  SUMMARY OF ONCOLOGIC HISTORY: Oncology History  Ovarian ca ([place])  07/22/2020 Imaging   Ct abdomen and pelvis 1. Large calcified mass in the pelvis that is new from 2010. There is associated large volume ascites with mild peritoneal nodularity/calcification, primarily worrisome for a calcifying ovarian  malignancy. 2. Small right pleural effusion with few calcifications in the anterior juxta diaphragmatic space, possibly related to #1. 3. Nonenlarged but calcified periaortic lymph nodes which are also worrisome for spread of disease.   07/29/2020 Procedure   Successful ultrasound-guided diagnostic and therapeutic paracentesis yielding 5 liters of peritoneal fluid.   07/29/2020 Tumor Marker   Patient's tumor was tested for the following markers: CA-125 Results of the tumor marker test revealed 771   07/30/2020 Initial Diagnosis   Ovarian ca ([place])   07/31/2020 Imaging   1. Calcified periesophageal and juxtadiaphragmatic nodules are indicative of metastatic disease. 2. Small right and trace left pleural effusions. 3. Ascites and calcified upper abdominal adenopathy/peritoneal nodules, better assessed on 07/22/2020. 4. Aortic atherosclerosis (ICD10-I70.0). Coronary artery calcification.   07/31/2020 Cancer Staging   Staging form: Ovary, Fallopian Tube, and Primary Peritoneal Carcinoma, AJCC 8th Edition - Clinical stage from 07/31/2020: Stage IV (cT3, cN1, cM1) - Signed by [name], MD on 07/31/2020 Stage prefix: Initial diagnosis   08/01/2020 Tumor Marker   Patient's tumor was tested for the following markers: CA-125 Results of the tumor marker test revealed 641   08/05/2020 -  Chemotherapy    Patient is on Treatment Plan: OVARIAN CARBOPLATIN (AUC 6) / PACLITAXEL (175) Q21D X 6 CYCLES        REVIEW OF SYSTEMS:   Constitutional: Denies fevers, chills Eyes: Denies blurriness of vision Ears, nose, mouth, throat, and face: Denies mucositis or sore throat Respiratory: Denies cough, dyspnea or wheezes Cardiovascular: Denies palpitation, chest discomfort or lower extremity swelling Skin: Denies abnormal skin rashes Lymphatics: Denies new lymphadenopathy or easy bruising Neurological:Denies numbness, tingling or new weaknesses Behavioral/Psych: Mood is stable, no new changes  All other systems  were reviewed with the patient and are negative.  I have reviewed the past medical history, past surgical history, social history and family history with the patient and they are unchanged from previous note.  ALLERGIES:  is allergic to cephalexin, hydrocodone-guaifenesin, tape, tramadol, and tramadol hcl.  MEDICATIONS:  Current Outpatient Medications  Medication Sig Dispense Refill  . acetaminophen (TYLENOL) 500 MG chewable tablet Chew 500 mg by mouth. Takes up to 6 a day.    Marland Kitchen CALCIUM PO Take 1,000 mg by mouth daily as needed.    . Cholecalciferol (VITAMIN D3 PO) Take 2,000 Units by mouth daily.     . Coenzyme Q10 (CO Q 10 PO) Take 300 mg by mouth daily.     Marland Kitchen dexamethasone (DECADRON) 4 MG tablet Take 2 tabs at the night before and 2 tab the morning of chemotherapy, every 3 weeks, by mouth x 6 cycles 36 tablet 6  . magnesium oxide (MAG-OX) 400 MG tablet Take by mouth daily.     . Melatonin 1 MG CAPS Take 5 mg by mouth at bedtime as needed.     . Multiple Vitamin (MULTI-VITAMINS) TABS Take 1 tablet by mouth daily.    . ondansetron (ZOFRAN) 8 MG tablet Take 1 tablet (8 mg total) by mouth every 8 (eight) hours as needed for refractory nausea / vomiting. Start on day 3 after carboplatin chemo. 30 tablet 1  . pravastatin (PRAVACHOL) 20 MG tablet Take 1 tablet (20 mg total) by mouth daily. 90 tablet 3  . predniSONE (DELTASONE) 1 MG tablet Take 9 tablets by mouth daily. 07/25/2020-Titrating down over the next 2 weeks    . prochlorperazine (COMPAZINE) 10 MG tablet Take 1 tablet (10 mg total) by mouth every 6 (six) hours as needed (Nausea or vomiting). 30 tablet 1   No current facility-administered medications for this visit.    PHYSICAL EXAMINATION: ECOG PERFORMANCE STATUS: 2 - Symptomatic, <50% confined to bed  Vitals:   08/13/20 1100  BP: 131/87  Pulse: 74  Resp: 17  Temp: 98.4 F (36.9 C)  SpO2: 98%   Filed Weights   08/13/20 1100  Weight: 145 lb 9.6 oz (66 kg)    GENERAL:alert,  no distress and comfortable SKIN: skin color, texture, turgor are normal, no rashes or significant lesions EYES: normal, Conjunctiva are pink and non-injected, sclera clear OROPHARYNX:no exudate, no erythema and lips, buccal mucosa, and tongue normal  NECK: supple, thyroid normal size, non-tender, without nodularity LYMPH:  no palpable lymphadenopathy in the cervical, axillary or inguinal LUNGS: clear to auscultation and percussion with normal breathing effort HEART: regular rate & rhythm and no murmurs and no lower extremity edema ABDOMEN:abdomen soft, distended with ascites, reduced bowel sounds Musculoskeletal:no cyanosis of digits and no clubbing  NEURO: alert & oriented x 3 with fluent speech, no focal motor/sensory deficits  LABORATORY DATA:  I have reviewed the data as listed    Component Value Date/Time   NA 142 08/01/2020 1520   NA 141 05/29/2019 1143   K 4.9 08/01/2020 1520   CL 106 08/01/2020 1520   CO2 28 08/01/2020 1520   GLUCOSE 117 (H) 08/01/2020 1520   BUN 32 (H) 08/01/2020 1520   BUN 29 (H) 05/29/2019 1143   CREATININE 0.83 08/01/2020 1520   CALCIUM 8.8 (L) 08/01/2020 1520   PROT 5.8 (L) 08/01/2020 1520   ALBUMIN 2.8 (L) 08/01/2020 1520   AST 13 (L) 08/01/2020 1520   ALT 11 08/01/2020 1520   ALKPHOS  63 08/01/2020 1520   BILITOT 0.3 08/01/2020 1520   GFRNONAA >60 08/01/2020 1520   GFRAA 74 05/29/2019 1143    No results found for: SPEP, UPEP  Lab Results  Component Value Date   WBC 8.6 08/01/2020   NEUTROABS 7.4 08/01/2020   HGB 12.4 08/01/2020   HCT 38.4 08/01/2020   MCV 87.1 08/01/2020   PLT 282 08/01/2020      Chemistry      Component Value Date/Time   NA 142 08/01/2020 1520   NA 141 05/29/2019 1143   K 4.9 08/01/2020 1520   CL 106 08/01/2020 1520   CO2 28 08/01/2020 1520   BUN 32 (H) 08/01/2020 1520   BUN 29 (H) 05/29/2019 1143   CREATININE 0.83 08/01/2020 1520      Component Value Date/Time   CALCIUM 8.8 (L) 08/01/2020 1520   ALKPHOS  63 08/01/2020 1520   AST 13 (L) 08/01/2020 1520   ALT 11 08/01/2020 1520   BILITOT 0.3 08/01/2020 1520       RADIOGRAPHIC STUDIES: I have personally reviewed the radiological images as listed and agreed with the findings in the report. CT CHEST W CONTRAST  Result Date: 07/31/2020 CLINICAL DATA:  Ovarian cancer staging. EXAM: CT CHEST WITH CONTRAST TECHNIQUE: Multidetector CT imaging of the chest was performed during intravenous contrast administration. CONTRAST:  36mL OMNIPAQUE IOHEXOL 300 MG/ML  SOLN COMPARISON:  CT abdomen pelvis 07/22/2020. FINDINGS: Cardiovascular: Atherosclerotic calcification of the aorta, aortic valve and coronary arteries. Heart is mildly enlarged. No pericardial effusion. Mediastinum/Nodes: Mediastinal lymph nodes are not enlarged by CT size criteria. No hilar or axillary adenopathy. Esophagus is grossly unremarkable. There are subcentimeter calcified nodules in the periesophageal and juxtadiaphragmatic fat. Lungs/Pleura: No suspicious pulmonary nodules. Small right and trace left pleural effusion. Airway is unremarkable. Upper Abdomen: Low-attenuation lesions in the liver measure up to 1.4 cm in the posterior right hepatic lobe, as before. Visualized portions of the liver, adrenal glands and right kidney are otherwise unremarkable. 3.2 cm low-density cyst in the upper pole left kidney. Visualized portions of the spleen, pancreas, stomach and bowel are grossly unremarkable with the exception of a small hiatal hernia. Large ascites. Scattered calcified lymph nodes and peritoneal nodules. Musculoskeletal: Degenerative changes in the spine and left shoulder. Right shoulder arthroplasty. No worrisome lytic or sclerotic lesions. IMPRESSION: 1. Calcified periesophageal and juxtadiaphragmatic nodules are indicative of metastatic disease. 2. Small right and trace left pleural effusions. 3. Ascites and calcified upper abdominal adenopathy/peritoneal nodules, better assessed on 07/22/2020.  4. Aortic atherosclerosis (ICD10-I70.0). Coronary artery calcification. Electronically Signed   By: Lorin Picket M.D.   On: 07/31/2020 09:01   CT ABDOMEN PELVIS W CONTRAST  Result Date: 07/22/2020 CLINICAL DATA:  Abdominal mass.  Asymptomatic EXAM: CT ABDOMEN AND PELVIS WITH CONTRAST TECHNIQUE: Multidetector CT imaging of the abdomen and pelvis was performed using the standard protocol following bolus administration of intravenous contrast. CONTRAST:  162mL OMNIPAQUE IOHEXOL 300 MG/ML  SOLN COMPARISON:  10/26/2008 abdominal CT FINDINGS: Lower chest: Small right pleural effusion which appears simple and low-density. There are calcified nodules at the anterior juxta diaphragmatic space which measure up to 6 cm. Hepatobiliary: No signs of cirrhosis. There are a few areas of subcapsular low-density that are stable from 2010 and benign. Limited calcifications seen along the falciform ligament.Cholecystectomy without worrisome bile duct dilatation. Pancreas: Generalized atrophy. Spleen: Area of irregular hypoenhancement in the subcapsular posterior spleen of doubtful significance. Capsule does not appear scalloped at this level. Adrenals/Urinary Tract: Negative  adrenals. No hydronephrosis or stone. Left upper pole cyst measuring up to 3.2 cm. Unremarkable bladder. Stomach/Bowel: Negative for bowel obstruction. Left colonic diverticulosis. Small sliding hiatal hernia. Vascular/Lymphatic: Mild for age atheromatous calcification. No enlarged retroperitoneal lymph nodes but there are a few calcified nodes in the periaortic station. Reproductive:Calcified mass in the pelvis with dominant nearly completely calcified component measuring 14 cm in transverse span. The calcification show cloudlike and nodular morphology. Adjacent to this mass is a right and posterior solid and cystic mass measuring 6 cm, with solid components being densely calcification. There is large volume ascites with areas of faint peritoneal thickening  in the gutters and occasional calcifications seen best at the level of the greater omentum. Findings are most concerning for an ovarian malignancy, especially serous type. Other: No pneumoperitoneum. Musculoskeletal: Right hip arthroplasty and lumbar spine degeneration. 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 Internal lipoma architecture remains benign. These results will be called to the ordering clinician or representative by the Radiologist Assistant, and communication documented in the PACS or [employer]. IMPRESSION: 1. Large calcified mass in the pelvis that is new from 2010. There is associated large volume ascites with mild peritoneal nodularity/calcification, primarily worrisome for a calcifying ovarian malignancy. 2. Small right pleural effusion with few calcifications in the anterior juxta diaphragmatic space, possibly related to #1. 3. Nonenlarged but calcified periaortic lymph nodes which are also worrisome for spread of disease. Electronically Signed   By: Monte Fantasia M.D.   On: 07/22/2020 10:47   US Paracentesis  Result Date: 07/29/2020 INDICATION: Patient with history of pelvic mass, peritoneal nodularity, ascites ; request received for diagnostic and therapeutic paracentesis up to 5 liters. EXAM: ULTRASOUND GUIDED DIAGNOSTIC AND THERAPEUTIC PARACENTESIS MEDICATIONS: 1% lidocaine to skin and subcutaneous tissue COMPLICATIONS: None immediate. PROCEDURE: Informed written consent was obtained from the patient after a discussion of the risks, benefits and alternatives to treatment. A timeout was performed prior to the initiation of the procedure. Initial ultrasound scanning demonstrates a large amount of ascites within the right lower abdominal quadrant. The right lower abdomen was prepped and draped in the usual sterile fashion. 1% lidocaine was used for local anesthesia. Following this, a 19 gauge, 7-cm,  Yueh catheter was introduced. An ultrasound image was saved for documentation purposes. The paracentesis was performed. The catheter was removed and a dressing was applied. The patient tolerated the procedure well without immediate post procedural complication. FINDINGS: A total of approximately 5 liters of yellow fluid was removed. Samples were sent to the laboratory as requested by the clinical team. IMPRESSION: Successful ultrasound-guided diagnostic and therapeutic paracentesis yielding 5 liters of peritoneal fluid. Read by: Rowe Robert, PA-C Electronically Signed   By: Miachel Roux M.D.   On: 07/29/2020 15:05   VAS [place] LOWER EXTREMITY VENOUS (DVT)  Result Date: 07/19/2020  Lower Venous DVT Study Indications: Pain, and Swelling.  Risk Factors: None identified. Limitations: Poor ultrasound/tissue interface. Comparison Study: No prior studies. Performing Technologist: Oliver Hum RVT  Examination Guidelines: A complete evaluation includes B-mode imaging, spectral Doppler, color Doppler, and power Doppler as needed of all accessible portions of each vessel. Bilateral testing is considered an integral part of a complete examination. Limited examinations for reoccurring indications may be performed as noted. The reflux portion of the exam is performed with the patient in reverse Trendelenburg.  +---------+---------------+---------+-----------+----------+--------------+ RIGHT    CompressibilityPhasicitySpontaneityPropertiesThrombus Aging +---------+---------------+---------+-----------+----------+--------------+ CFV      Full  Yes      Yes                                 +---------+---------------+---------+-----------+----------+--------------+ SFJ      Full                                                        +---------+---------------+---------+-----------+----------+--------------+ FV Prox  Full                                                         +---------+---------------+---------+-----------+----------+--------------+ FV Mid   Full                                                        +---------+---------------+---------+-----------+----------+--------------+ FV DistalFull                                                        +---------+---------------+---------+-----------+----------+--------------+ PFV      Full                                                        +---------+---------------+---------+-----------+----------+--------------+ POP      Full           Yes      Yes                                 +---------+---------------+---------+-----------+----------+--------------+ PTV      Full                                                        +---------+---------------+---------+-----------+----------+--------------+ PERO     Full                                                        +---------+---------------+---------+-----------+----------+--------------+   +---------+---------------+---------+-----------+----------+--------------+ LEFT     CompressibilityPhasicitySpontaneityPropertiesThrombus Aging +---------+---------------+---------+-----------+----------+--------------+ CFV      Full           Yes      Yes                                 +---------+---------------+---------+-----------+----------+--------------+ SFJ  Full                                                        +---------+---------------+---------+-----------+----------+--------------+ FV Prox  Full                                                        +---------+---------------+---------+-----------+----------+--------------+ FV Mid   Full                                                        +---------+---------------+---------+-----------+----------+--------------+ FV DistalFull                                                         +---------+---------------+---------+-----------+----------+--------------+ PFV      Full                                                        +---------+---------------+---------+-----------+----------+--------------+ POP      Full           Yes      Yes                                 +---------+---------------+---------+-----------+----------+--------------+ PTV      Full                                                        +---------+---------------+---------+-----------+----------+--------------+ PERO     Full                                                        +---------+---------------+---------+-----------+----------+--------------+     Summary: RIGHT: - There is no evidence of deep vein thrombosis in the lower extremity.  - No cystic structure found in the popliteal fossa.  LEFT: - There is no evidence of deep vein thrombosis in the lower extremity.  - No cystic structure found in the popliteal fossa.  *See table(s) above for measurements and observations. Electronically signed by Deitra Mayo MD on 07/19/2020 at 6:21:48 PM.    Final

## 2020-08-13 NOTE — Assessment & Plan Note (Signed)
She has minimum pleural effusion noted Observe for now

## 2020-08-13 NOTE — Assessment & Plan Note (Signed)
Clinically, she appears to be responding to treatment Her increased belching could still represent positive response to therapy I plan to see her again next week for further follow-up and supportive care

## 2020-08-13 NOTE — Assessment & Plan Note (Signed)
She has malignant ascites on exam Currently, she is relatively asymptomatic We discussed the risk and benefits of therapeutic paracentesis For now, I recommend close surveillance I plan to examine her again next week and if her symptoms are worse, we can order therapeutic paracentesis

## 2020-08-19 ENCOUNTER — Inpatient Hospital Stay (HOSPITAL_BASED_OUTPATIENT_CLINIC_OR_DEPARTMENT_OTHER): Payer: Medicare Other | Admitting: Hematology and Oncology

## 2020-08-19 ENCOUNTER — Encounter: Payer: Self-pay | Admitting: Hematology and Oncology

## 2020-08-19 ENCOUNTER — Other Ambulatory Visit: Payer: Self-pay

## 2020-08-19 DIAGNOSIS — N183 Chronic kidney disease, stage 3 unspecified: Secondary | ICD-10-CM | POA: Diagnosis not present

## 2020-08-19 DIAGNOSIS — R180 Malignant ascites: Secondary | ICD-10-CM

## 2020-08-19 DIAGNOSIS — C569 Malignant neoplasm of unspecified ovary: Secondary | ICD-10-CM

## 2020-08-19 DIAGNOSIS — Z5111 Encounter for antineoplastic chemotherapy: Secondary | ICD-10-CM | POA: Diagnosis not present

## 2020-08-19 DIAGNOSIS — J90 Pleural effusion, not elsewhere classified: Secondary | ICD-10-CM | POA: Diagnosis not present

## 2020-08-19 DIAGNOSIS — R634 Abnormal weight loss: Secondary | ICD-10-CM | POA: Diagnosis not present

## 2020-08-19 DIAGNOSIS — I480 Paroxysmal atrial fibrillation: Secondary | ICD-10-CM | POA: Diagnosis not present

## 2020-08-19 NOTE — Progress Notes (Signed)
[place] OFFICE PROGRESS NOTE  Patient Care Team: [name], MD as PCP - General (Family Medicine) [name], MD as Consulting Physician (Cardiology)  ASSESSMENT & PLAN:  Ovarian ca [facility]) Her physical examination showed probable positive response to treatment I could not detect any signs of ascites She has less lower extremity edema She has persistent ascites but at this point, I do not believe she needs therapeutic paracentesis I will see her again next week for further follow-up  Ascites She has malignant ascites on exam Currently, she is relatively asymptomatic We discussed the risk and benefits of therapeutic paracentesis For now, I recommend close surveillance I plan to examine her again next week and if her symptoms are worse, we can order therapeutic paracentesis  Weight loss, abnormal Her weight loss has stabilized Observe closely for now The patient is able to keep up with nutritional intake and focus on high-protein diet   No orders of the defined types were placed in this encounter.   All questions were answered. The patient knows to call the clinic with any problems, questions or concerns. The total time spent in the appointment was 20 minutes encounter with patients including review of chart and various tests results, discussions about plan of care and coordination of care plan   [name], MD 08/19/2020 2:25 PM  INTERVAL HISTORY: Please see below for problem oriented charting. She returns for further follow-up She is doing well She is eating good Denies abdominal pain She is able to have regular bowel movement No nausea or constipation Denies recent chest pain or shortness of breath  SUMMARY OF ONCOLOGIC HISTORY: Oncology History  Ovarian ca ([place])  07/22/2020 Imaging   Ct abdomen and pelvis 1. Large calcified mass in the pelvis that is new from 2010. There is associated large volume ascites with mild peritoneal  nodularity/calcification, primarily worrisome for a calcifying ovarian malignancy. 2. Small right pleural effusion with few calcifications in the anterior juxta diaphragmatic space, possibly related to #1. 3. Nonenlarged but calcified periaortic lymph nodes which are also worrisome for spread of disease.   07/29/2020 Procedure   Successful ultrasound-guided diagnostic and therapeutic paracentesis yielding 5 liters of peritoneal fluid.   07/29/2020 Tumor Marker   Patient's tumor was tested for the following markers: CA-125 Results of the tumor marker test revealed 771   07/30/2020 Initial Diagnosis   Ovarian ca ([place])   07/31/2020 Imaging   1. Calcified periesophageal and juxtadiaphragmatic nodules are indicative of metastatic disease. 2. Small right and trace left pleural effusions. 3. Ascites and calcified upper abdominal adenopathy/peritoneal nodules, better assessed on 07/22/2020. 4. Aortic atherosclerosis (ICD10-I70.0). Coronary artery calcification.   07/31/2020 Cancer Staging   Staging form: Ovary, Fallopian Tube, and Primary Peritoneal Carcinoma, AJCC 8th Edition - Clinical stage from 07/31/2020: Stage IV (cT3, cN1, cM1) - Signed by [name], MD on 07/31/2020 Stage prefix: Initial diagnosis   08/01/2020 Tumor Marker   Patient's tumor was tested for the following markers: CA-125 Results of the tumor marker test revealed 641   08/05/2020 -  Chemotherapy    Patient is on Treatment Plan: OVARIAN CARBOPLATIN (AUC 6) / PACLITAXEL (175) Q21D X 6 CYCLES        REVIEW OF SYSTEMS:   Constitutional: Denies fevers, chills or abnormal weight loss Eyes: Denies blurriness of vision Ears, nose, mouth, throat, and face: Denies mucositis or sore throat Respiratory: Denies cough, dyspnea or wheezes Cardiovascular: Denies palpitation, chest discomfort or lower extremity swelling Gastrointestinal:  Denies  nausea, heartburn or change in bowel habits Skin: Denies abnormal skin rashes Lymphatics: Denies  new lymphadenopathy or easy bruising Neurological:Denies numbness, tingling or new weaknesses Behavioral/Psych: Mood is stable, no new changes  All other systems were reviewed with the patient and are negative.  I have reviewed the past medical history, past surgical history, social history and family history with the patient and they are unchanged from previous note.  ALLERGIES:  is allergic to cephalexin, hydrocodone-guaifenesin, tape, tramadol, and tramadol hcl.  MEDICATIONS:  Current Outpatient Medications  Medication Sig Dispense Refill  . acetaminophen (TYLENOL) 500 MG chewable tablet Chew 500 mg by mouth. Takes up to 6 a day.    Marland Kitchen CALCIUM PO Take 1,000 mg by mouth daily as needed.    . Cholecalciferol (VITAMIN D3 PO) Take 2,000 Units by mouth daily.     . Coenzyme Q10 (CO Q 10 PO) Take 300 mg by mouth daily.     Marland Kitchen dexamethasone (DECADRON) 4 MG tablet Take 2 tabs at the night before and 2 tab the morning of chemotherapy, every 3 weeks, by mouth x 6 cycles 36 tablet 6  . magnesium oxide (MAG-OX) 400 MG tablet Take by mouth daily.     . Melatonin 1 MG CAPS Take 5 mg by mouth at bedtime as needed.     . Multiple Vitamin (MULTI-VITAMINS) TABS Take 1 tablet by mouth daily.    . ondansetron (ZOFRAN) 8 MG tablet Take 1 tablet (8 mg total) by mouth every 8 (eight) hours as needed for refractory nausea / vomiting. Start on day 3 after carboplatin chemo. 30 tablet 1  . pravastatin (PRAVACHOL) 20 MG tablet Take 1 tablet (20 mg total) by mouth daily. 90 tablet 3  . predniSONE (DELTASONE) 1 MG tablet Take 9 tablets by mouth daily. 07/25/2020-Titrating down over the next 2 weeks    . prochlorperazine (COMPAZINE) 10 MG tablet Take 1 tablet (10 mg total) by mouth every 6 (six) hours as needed (Nausea or vomiting). 30 tablet 1   No current facility-administered medications for this visit.    PHYSICAL EXAMINATION: ECOG PERFORMANCE STATUS: 2 - Symptomatic, <50% confined to bed  Vitals:   08/19/20  1002  BP: 140/74  Pulse: 82  Resp: 18  Temp: 98.5 F (36.9 C)  SpO2: 98%   Filed Weights   08/19/20 1002  Weight: 146 lb 6.4 oz (66.4 kg)    GENERAL:alert, no distress and comfortable SKIN: skin color, texture, turgor are normal, no rashes or significant lesions EYES: normal, Conjunctiva are pink and non-injected, sclera clear OROPHARYNX:no exudate, no erythema and lips, buccal mucosa, and tongue normal  NECK: supple, thyroid normal size, non-tender, without nodularity LYMPH:  no palpable lymphadenopathy in the cervical, axillary or inguinal LUNGS: clear to auscultation and percussion with normal breathing effort HEART: regular rate & rhythm and no murmurs and no lower extremity edema ABDOMEN:abdomen soft, distended with ascites  musculoskeletal:no cyanosis of digits and no clubbing  NEURO: alert & oriented x 3 with fluent speech, no focal motor/sensory deficits  LABORATORY DATA:  I have reviewed the data as listed    Component Value Date/Time   NA 142 08/01/2020 1520   NA 141 05/29/2019 1143   K 4.9 08/01/2020 1520   CL 106 08/01/2020 1520   CO2 28 08/01/2020 1520   GLUCOSE 117 (H) 08/01/2020 1520   BUN 32 (H) 08/01/2020 1520   BUN 29 (H) 05/29/2019 1143   CREATININE 0.83 08/01/2020 1520   CALCIUM 8.8 (L) 08/01/2020  1520   PROT 5.8 (L) 08/01/2020 1520   ALBUMIN 2.8 (L) 08/01/2020 1520   AST 13 (L) 08/01/2020 1520   ALT 11 08/01/2020 1520   ALKPHOS 63 08/01/2020 1520   BILITOT 0.3 08/01/2020 1520   GFRNONAA >60 08/01/2020 1520   GFRAA 74 05/29/2019 1143    No results found for: SPEP, UPEP  Lab Results  Component Value Date   WBC 8.6 08/01/2020   NEUTROABS 7.4 08/01/2020   HGB 12.4 08/01/2020   HCT 38.4 08/01/2020   MCV 87.1 08/01/2020   PLT 282 08/01/2020      Chemistry      Component Value Date/Time   NA 142 08/01/2020 1520   NA 141 05/29/2019 1143   K 4.9 08/01/2020 1520   CL 106 08/01/2020 1520   CO2 28 08/01/2020 1520   BUN 32 (H) 08/01/2020  1520   BUN 29 (H) 05/29/2019 1143   CREATININE 0.83 08/01/2020 1520      Component Value Date/Time   CALCIUM 8.8 (L) 08/01/2020 1520   ALKPHOS 63 08/01/2020 1520   AST 13 (L) 08/01/2020 1520   ALT 11 08/01/2020 1520   BILITOT 0.3 08/01/2020 1520

## 2020-08-19 NOTE — Assessment & Plan Note (Signed)
She has malignant ascites on exam Currently, she is relatively asymptomatic We discussed the risk and benefits of therapeutic paracentesis For now, I recommend close surveillance I plan to examine her again next week and if her symptoms are worse, we can order therapeutic paracentesis

## 2020-08-19 NOTE — Assessment & Plan Note (Signed)
Her weight loss has stabilized Observe closely for now The patient is able to keep up with nutritional intake and focus on high-protein diet

## 2020-08-19 NOTE — Assessment & Plan Note (Signed)
Her physical examination showed probable positive response to treatment I could not detect any signs of ascites She has less lower extremity edema She has persistent ascites but at this point, I do not believe she needs therapeutic paracentesis I will see her again next week for further follow-up

## 2020-08-20 ENCOUNTER — Ambulatory Visit: Payer: Medicare Other | Admitting: Psychology

## 2020-08-23 ENCOUNTER — Telehealth: Payer: Self-pay

## 2020-08-23 NOTE — Telephone Encounter (Signed)
Husband called and left a message to call.  Called back. [name] is complaining of constipation. Last bm was yesterday and it was a small amount. She is taking Miralax BID with prune juice. Instructed to start Senokot 2 tabs TID. Instructed to call the office back if needed. [name] and husband verbalized understanding.   FYI

## 2020-08-26 ENCOUNTER — Inpatient Hospital Stay: Payer: Medicare Other

## 2020-08-26 ENCOUNTER — Encounter: Payer: Self-pay | Admitting: Hematology and Oncology

## 2020-08-26 ENCOUNTER — Other Ambulatory Visit: Payer: Self-pay

## 2020-08-26 ENCOUNTER — Inpatient Hospital Stay (HOSPITAL_BASED_OUTPATIENT_CLINIC_OR_DEPARTMENT_OTHER): Payer: Medicare Other | Admitting: Hematology and Oncology

## 2020-08-26 ENCOUNTER — Telehealth: Payer: Self-pay | Admitting: Hematology and Oncology

## 2020-08-26 DIAGNOSIS — C569 Malignant neoplasm of unspecified ovary: Secondary | ICD-10-CM

## 2020-08-26 DIAGNOSIS — Z5111 Encounter for antineoplastic chemotherapy: Secondary | ICD-10-CM | POA: Diagnosis not present

## 2020-08-26 DIAGNOSIS — R180 Malignant ascites: Secondary | ICD-10-CM | POA: Diagnosis not present

## 2020-08-26 DIAGNOSIS — R634 Abnormal weight loss: Secondary | ICD-10-CM

## 2020-08-26 DIAGNOSIS — I480 Paroxysmal atrial fibrillation: Secondary | ICD-10-CM | POA: Diagnosis not present

## 2020-08-26 DIAGNOSIS — J90 Pleural effusion, not elsewhere classified: Secondary | ICD-10-CM | POA: Diagnosis not present

## 2020-08-26 DIAGNOSIS — N183 Chronic kidney disease, stage 3 unspecified: Secondary | ICD-10-CM | POA: Diagnosis not present

## 2020-08-26 LAB — CMP (CANCER CENTER ONLY)
ALT: 13 U/L (ref 0–44)
AST: 15 U/L (ref 15–41)
Albumin: 3.1 g/dL — ABNORMAL LOW (ref 3.5–5.0)
Alkaline Phosphatase: 86 U/L (ref 38–126)
Anion gap: 7 (ref 5–15)
BUN: 33 mg/dL — ABNORMAL HIGH (ref 8–23)
CO2: 23 mmol/L (ref 22–32)
Calcium: 9 mg/dL (ref 8.9–10.3)
Chloride: 106 mmol/L (ref 98–111)
Creatinine: 0.82 mg/dL (ref 0.44–1.00)
GFR, Estimated: >60 mL/min (ref >60)
Glucose, Bld: 98 mg/dL (ref 70–99)
Potassium: 4.2 mmol/L (ref 3.5–5.1)
Sodium: 136 mmol/L (ref 135–145)
Total Bilirubin: 0.3 mg/dL (ref 0.3–1.2)
Total Protein: 6.4 g/dL — ABNORMAL LOW (ref 6.5–8.1)

## 2020-08-26 LAB — CBC WITH DIFFERENTIAL (CANCER CENTER ONLY)
Abs Immature Granulocytes: 0.04 10*3/uL (ref 0.00–0.07)
Basophils Absolute: 0 10*3/uL (ref 0.0–0.1)
Basophils Relative: 1 %
Eosinophils Absolute: 0 10*3/uL (ref 0.0–0.5)
Eosinophils Relative: 0 %
HCT: 35.8 % — ABNORMAL LOW (ref 36.0–46.0)
Hemoglobin: 11.9 g/dL — ABNORMAL LOW (ref 12.0–15.0)
Immature Granulocytes: 1 %
Lymphocytes Relative: 12 %
Lymphs Abs: 0.7 10*3/uL (ref 0.7–4.0)
MCH: 28.2 pg (ref 26.0–34.0)
MCHC: 33.2 g/dL (ref 30.0–36.0)
MCV: 84.8 fL (ref 80.0–100.0)
Monocytes Absolute: 0.5 10*3/uL (ref 0.1–1.0)
Monocytes Relative: 9 %
Neutro Abs: 4.6 10*3/uL (ref 1.7–7.7)
Neutrophils Relative %: 77 %
Platelet Count: 203 10*3/uL (ref 150–400)
RBC: 4.22 MIL/uL (ref 3.87–5.11)
RDW: 14.8 % (ref 11.5–15.5)
WBC Count: 6 10*3/uL (ref 4.0–10.5)
nRBC: 0 % (ref 0.0–0.2)

## 2020-08-26 MED ORDER — DIPHENHYDRAMINE HCL 50 MG/ML IJ SOLN
INTRAMUSCULAR | Status: AC
  Filled 2020-08-26: qty 1

## 2020-08-26 MED ORDER — SODIUM CHLORIDE 0.9 % IV SOLN
150.0000 mg | Freq: Once | INTRAVENOUS | Status: AC
  Administered 2020-08-26: 150 mg via INTRAVENOUS
  Filled 2020-08-26: qty 150

## 2020-08-26 MED ORDER — SODIUM CHLORIDE 0.9 % IV SOLN
Freq: Once | INTRAVENOUS | Status: AC
  Filled 2020-08-26: qty 250

## 2020-08-26 MED ORDER — PALONOSETRON HCL INJECTION 0.25 MG/5ML
INTRAVENOUS | Status: AC
  Filled 2020-08-26: qty 5

## 2020-08-26 MED ORDER — FAMOTIDINE IN NACL 20-0.9 MG/50ML-% IV SOLN
20.0000 mg | Freq: Once | INTRAVENOUS | Status: AC
  Administered 2020-08-26: 20 mg via INTRAVENOUS

## 2020-08-26 MED ORDER — SODIUM CHLORIDE 0.9 % IV SOLN
175.0000 mg/m2 | Freq: Once | INTRAVENOUS | Status: AC
  Administered 2020-08-26: 300 mg via INTRAVENOUS
  Filled 2020-08-26: qty 50

## 2020-08-26 MED ORDER — FAMOTIDINE IN NACL 20-0.9 MG/50ML-% IV SOLN
INTRAVENOUS | Status: AC
  Filled 2020-08-26: qty 50

## 2020-08-26 MED ORDER — PALONOSETRON HCL INJECTION 0.25 MG/5ML
0.2500 mg | Freq: Once | INTRAVENOUS | Status: AC
  Administered 2020-08-26: 0.25 mg via INTRAVENOUS

## 2020-08-26 MED ORDER — SODIUM CHLORIDE 0.9 % IV SOLN
432.0000 mg | Freq: Once | INTRAVENOUS | Status: AC
  Administered 2020-08-26: 430 mg via INTRAVENOUS
  Filled 2020-08-26: qty 43

## 2020-08-26 MED ORDER — DIPHENHYDRAMINE HCL 50 MG/ML IJ SOLN
12.5000 mg | Freq: Once | INTRAMUSCULAR | Status: AC
  Administered 2020-08-26: 12.5 mg via INTRAVENOUS

## 2020-08-26 MED ORDER — SODIUM CHLORIDE 0.9 % IV SOLN
10.0000 mg | Freq: Once | INTRAVENOUS | Status: AC
  Administered 2020-08-26: 10 mg via INTRAVENOUS
  Filled 2020-08-26: qty 10

## 2020-08-26 NOTE — Assessment & Plan Note (Signed)
She has malignant ascites on exam Currently, she is relatively asymptomatic We discussed the risk and benefits of therapeutic paracentesis For now, I recommend close surveillance I plan to examine her again next week and if her symptoms are worse, we can order therapeutic paracentesis

## 2020-08-26 NOTE — Assessment & Plan Note (Signed)
Overall, she is improving She has persistent abdominal ascites but not symptomatic I plan to see her again next week and decide if she needs therapeutic paracentesis

## 2020-08-26 NOTE — Patient Instructions (Signed)
[facility] Discharge Instructions for Patients Receiving Chemotherapy  Today you received the following chemotherapy agents: Paclitaxel (Taxol) and Carboplatin.  To help prevent nausea and vomiting after your treatment, we encourage you to take your nausea medication as directed by your MD.   If you develop nausea and vomiting that is not controlled by your nausea medication, call the clinic.   BELOW ARE SYMPTOMS THAT SHOULD BE REPORTED IMMEDIATELY:  *FEVER GREATER THAN 100.5 F  *CHILLS WITH OR WITHOUT FEVER  NAUSEA AND VOMITING THAT IS NOT CONTROLLED WITH YOUR NAUSEA MEDICATION  *UNUSUAL SHORTNESS OF BREATH  *UNUSUAL BRUISING OR BLEEDING  TENDERNESS IN MOUTH AND THROAT WITH OR WITHOUT PRESENCE OF ULCERS  *URINARY PROBLEMS  *BOWEL PROBLEMS  UNUSUAL RASH Items with * indicate a potential emergency and should be followed up as soon as possible.  Feel free to call the clinic should you have any questions or concerns. The clinic phone number is [phone].  Please show the CHEMO ALERT CARD at check-in to the Emergency Department and triage nurse.    

## 2020-08-26 NOTE — Telephone Encounter (Signed)
Scheduled appts per 2/28 sch msg. Gave pt a print out of AVS.

## 2020-08-26 NOTE — Assessment & Plan Note (Signed)
Her weight loss has stabilized We discussed the importance of high-protein diet

## 2020-08-26 NOTE — Progress Notes (Signed)
[name] OFFICE PROGRESS NOTE  Patient Care Team: [name], MD as PCP - General (Family Medicine) [name], MD as Consulting Physician (Cardiology)  ASSESSMENT & PLAN:  Ovarian ca [facility]) Overall, she is improving She has persistent abdominal ascites but not symptomatic I plan to see her again next week and decide if she needs therapeutic paracentesis   Weight loss, abnormal Her weight loss has stabilized We discussed the importance of high-protein diet  Ascites She has malignant ascites on exam Currently, she is relatively asymptomatic We discussed the risk and benefits of therapeutic paracentesis For now, I recommend close surveillance I plan to examine her again next week and if her symptoms are worse, we can order therapeutic paracentesis   No orders of the defined types were placed in this encounter.   All questions were answered. The patient knows to call the clinic with any problems, questions or concerns. The total time spent in the appointment was 20 minutes encounter with patients including review of chart and various tests results, discussions about plan of care and coordination of care plan   [name], MD 08/26/2020 3:02 PM  INTERVAL HISTORY: Please see below for problem oriented charting. She returns with her husband, to be seen prior to cycle 2 of treatment She is doing well She denies shortness of breath Her appetite is fair and she has not lost any weight No recent nausea She has regular bowel movement  SUMMARY OF ONCOLOGIC HISTORY: Oncology History  Ovarian ca ([place])  07/22/2020 Imaging   Ct abdomen and pelvis 1. Large calcified mass in the pelvis that is new from 2010. There is associated large volume ascites with mild peritoneal nodularity/calcification, primarily worrisome for a calcifying ovarian malignancy. 2. Small right pleural effusion with few calcifications in the anterior juxta diaphragmatic space, possibly  related to #1. 3. Nonenlarged but calcified periaortic lymph nodes which are also worrisome for spread of disease.   07/29/2020 Procedure   Successful ultrasound-guided diagnostic and therapeutic paracentesis yielding 5 liters of peritoneal fluid.   07/29/2020 Tumor Marker   Patient's tumor was tested for the following markers: CA-125 Results of the tumor marker test revealed 771   07/30/2020 Initial Diagnosis   Ovarian ca ([place])   07/31/2020 Imaging   1. Calcified periesophageal and juxtadiaphragmatic nodules are indicative of metastatic disease. 2. Small right and trace left pleural effusions. 3. Ascites and calcified upper abdominal adenopathy/peritoneal nodules, better assessed on 07/22/2020. 4. Aortic atherosclerosis (ICD10-I70.0). Coronary artery calcification.   07/31/2020 Cancer Staging   Staging form: Ovary, Fallopian Tube, and Primary Peritoneal Carcinoma, AJCC 8th Edition - Clinical stage from 07/31/2020: Stage IV (cT3, cN1, cM1) - Signed by [name], MD on 07/31/2020 Stage prefix: Initial diagnosis   08/01/2020 Tumor Marker   Patient's tumor was tested for the following markers: CA-125 Results of the tumor marker test revealed 641   08/05/2020 -  Chemotherapy    Patient is on Treatment Plan: OVARIAN CARBOPLATIN (AUC 6) / PACLITAXEL (175) Q21D X 6 CYCLES        REVIEW OF SYSTEMS:   Constitutional: Denies fevers, chills or abnormal weight loss Eyes: Denies blurriness of vision Ears, nose, mouth, throat, and face: Denies mucositis or sore throat Respiratory: Denies cough, dyspnea or wheezes Cardiovascular: Denies palpitation, chest discomfort or lower extremity swelling Gastrointestinal:  Denies nausea, heartburn or change in bowel habits Skin: Denies abnormal skin rashes Lymphatics: Denies new lymphadenopathy or easy bruising Neurological:Denies numbness, tingling or new weaknesses Behavioral/Psych: Mood  is stable, no new changes  All other systems were reviewed with the  patient and are negative.  I have reviewed the past medical history, past surgical history, social history and family history with the patient and they are unchanged from previous note.  ALLERGIES:  is allergic to cephalexin, hydrocodone-guaifenesin, tape, tramadol, and tramadol hcl.  MEDICATIONS:  Current Outpatient Medications  Medication Sig Dispense Refill  . acetaminophen (TYLENOL) 500 MG chewable tablet Chew 500 mg by mouth. Takes up to 6 a day.    Marland Kitchen CALCIUM PO Take 1,000 mg by mouth daily as needed.    . Cholecalciferol (VITAMIN D3 PO) Take 2,000 Units by mouth daily.     . Coenzyme Q10 (CO Q 10 PO) Take 300 mg by mouth daily.     Marland Kitchen dexamethasone (DECADRON) 4 MG tablet Take 2 tabs at the night before and 2 tab the morning of chemotherapy, every 3 weeks, by mouth x 6 cycles 36 tablet 6  . magnesium oxide (MAG-OX) 400 MG tablet Take by mouth daily.     . Melatonin 1 MG CAPS Take 5 mg by mouth at bedtime as needed.     . Multiple Vitamin (MULTI-VITAMINS) TABS Take 1 tablet by mouth daily.    . ondansetron (ZOFRAN) 8 MG tablet Take 1 tablet (8 mg total) by mouth every 8 (eight) hours as needed for refractory nausea / vomiting. Start on day 3 after carboplatin chemo. 30 tablet 1  . pravastatin (PRAVACHOL) 20 MG tablet Take 1 tablet (20 mg total) by mouth daily. 90 tablet 3  . predniSONE (DELTASONE) 1 MG tablet Take 9 tablets by mouth daily. 07/25/2020-Titrating down over the next 2 weeks    . prochlorperazine (COMPAZINE) 10 MG tablet Take 1 tablet (10 mg total) by mouth every 6 (six) hours as needed (Nausea or vomiting). 30 tablet 1   No current facility-administered medications for this visit.   Facility-Administered Medications Ordered in Other Visits  Medication Dose Route Frequency Provider Last Rate Last Admin  . CARBOplatin (PARAPLATIN) 430 mg in sodium chloride 0.9 % 250 mL chemo infusion  430 mg Intravenous Once Alvy Bimler, Vitoria Conyer, MD      . PACLitaxel (TAXOL) 300 mg in sodium chloride  0.9 % 250 mL chemo infusion (> 80mg /m2)  175 mg/m2 (Treatment Plan Recorded) Intravenous Once Alvy Bimler, Roshawna Colclasure, MD 100 mL/hr at 08/26/20 1330 300 mg at 08/26/20 1330    PHYSICAL EXAMINATION: ECOG PERFORMANCE STATUS: 1 - Symptomatic but completely ambulatory  Vitals:   08/26/20 1032  BP: (!) 141/77  Pulse: 85  Resp: 17  Temp: (!) 97.2 F (36.2 C)  SpO2: 98%   Filed Weights   08/26/20 1032  Weight: 146 lb 12.8 oz (66.6 kg)    GENERAL:alert, no distress and comfortable SKIN: skin color, texture, turgor are normal, no rashes or significant lesions EYES: normal, Conjunctiva are pink and non-injected, sclera clear OROPHARYNX:no exudate, no erythema and lips, buccal mucosa, and tongue normal  NECK: supple, thyroid normal size, non-tender, without nodularity LYMPH:  no palpable lymphadenopathy in the cervical, axillary or inguinal LUNGS: clear to auscultation and percussion with normal breathing effort HEART: regular rate & rhythm and no murmurs and no lower extremity edema ABDOMEN:abdomen soft, distended with ascites Musculoskeletal:no cyanosis of digits and no clubbing  NEURO: alert & oriented x 3 with fluent speech, no focal motor/sensory deficits  LABORATORY DATA:  I have reviewed the data as listed    Component Value Date/Time   NA 136 08/26/2020 1015  NA 141 05/29/2019 1143   K 4.2 08/26/2020 1015   CL 106 08/26/2020 1015   CO2 23 08/26/2020 1015   GLUCOSE 98 08/26/2020 1015   BUN 33 (H) 08/26/2020 1015   BUN 29 (H) 05/29/2019 1143   CREATININE 0.82 08/26/2020 1015   CALCIUM 9.0 08/26/2020 1015   PROT 6.4 (L) 08/26/2020 1015   ALBUMIN 3.1 (L) 08/26/2020 1015   AST 15 08/26/2020 1015   ALT 13 08/26/2020 1015   ALKPHOS 86 08/26/2020 1015   BILITOT 0.3 08/26/2020 1015   GFRNONAA >60 08/26/2020 1015   GFRAA 74 05/29/2019 1143    No results found for: SPEP, UPEP  Lab Results  Component Value Date   WBC 6.0 08/26/2020   NEUTROABS 4.6 08/26/2020   HGB 11.9 (L)  08/26/2020   HCT 35.8 (L) 08/26/2020   MCV 84.8 08/26/2020   PLT 203 08/26/2020      Chemistry      Component Value Date/Time   NA 136 08/26/2020 1015   NA 141 05/29/2019 1143   K 4.2 08/26/2020 1015   CL 106 08/26/2020 1015   CO2 23 08/26/2020 1015   BUN 33 (H) 08/26/2020 1015   BUN 29 (H) 05/29/2019 1143   CREATININE 0.82 08/26/2020 1015      Component Value Date/Time   CALCIUM 9.0 08/26/2020 1015   ALKPHOS 86 08/26/2020 1015   AST 15 08/26/2020 1015   ALT 13 08/26/2020 1015   BILITOT 0.3 08/26/2020 1015

## 2020-08-27 LAB — CA 125: Cancer Antigen (CA) 125: 663 U/mL — ABNORMAL HIGH (ref 0.0–38.1)

## 2020-09-02 ENCOUNTER — Other Ambulatory Visit: Payer: Self-pay

## 2020-09-02 ENCOUNTER — Inpatient Hospital Stay: Payer: Medicare Other | Attending: Hematology and Oncology | Admitting: Hematology and Oncology

## 2020-09-02 ENCOUNTER — Encounter: Payer: Self-pay | Admitting: Hematology and Oncology

## 2020-09-02 VITALS — BP 120/72 | HR 91 | Temp 98.7°F | Resp 18 | Ht 61.0 in | Wt 145.4 lb

## 2020-09-02 DIAGNOSIS — K5909 Other constipation: Secondary | ICD-10-CM

## 2020-09-02 DIAGNOSIS — C569 Malignant neoplasm of unspecified ovary: Secondary | ICD-10-CM

## 2020-09-02 DIAGNOSIS — K5900 Constipation, unspecified: Secondary | ICD-10-CM | POA: Diagnosis not present

## 2020-09-02 DIAGNOSIS — R630 Anorexia: Secondary | ICD-10-CM | POA: Diagnosis not present

## 2020-09-02 DIAGNOSIS — R0981 Nasal congestion: Secondary | ICD-10-CM

## 2020-09-02 DIAGNOSIS — R531 Weakness: Secondary | ICD-10-CM | POA: Diagnosis not present

## 2020-09-02 DIAGNOSIS — R180 Malignant ascites: Secondary | ICD-10-CM

## 2020-09-02 NOTE — Assessment & Plan Note (Signed)
We had a significant discussion about the importance of aggressive laxative therapy She will continue MiraLAX with Senokot

## 2020-09-02 NOTE — Assessment & Plan Note (Signed)
I suspect her anorexia is multifactorial, exacerbated by recent constipation and compression of her stomach by ascites I am hopeful it will improve by next week For now, I do not recommend daily dexamethasone due to potential side effects with more fluid retention and proximal myopathy I recommend she continue calorie counts and I gave her some suggestions about high calorie diet

## 2020-09-02 NOTE — Progress Notes (Signed)
[place] OFFICE PROGRESS NOTE  Patient Care Team: [name], MD as PCP - General (Family Medicine) [name], MD as Consulting Physician (Cardiology)  ASSESSMENT & PLAN:  Ovarian ca [facility]) She is getting quite uncomfortable with her malignant ascites I recommend urgent paracentesis tomorrow I will see her again next week for further follow-up  Constipation We had a significant discussion about the importance of aggressive laxative therapy She will continue MiraLAX with Senokot  Nasal congestion This is likely due to sinus allergies I recommend over-the-counter remedies  Anorexia I suspect her anorexia is multifactorial, exacerbated by recent constipation and compression of her stomach by ascites I am hopeful it will improve by next week For now, I do not recommend daily dexamethasone due to potential side effects with more fluid retention and proximal myopathy I recommend she continue calorie counts and I gave her some suggestions about high calorie diet  Generalized weakness This is likely due to poor oral intake Suggested high calorie diet I am hopeful that with paracentesis, her symptoms will improve   Orders Placed This Encounter  Procedures  . US Paracentesis    Standing Status:   Future    Standing Expiration Date:   09/02/2021    Order Specific Question:   If therapeutic, is there a maximum amount of fluid to be removed?    Answer:   Yes    Order Specific Question:   What is the maximum amount of fluide to be removed?    Answer:   5 liter    Order Specific Question:   Are labs required for specimen collection?    Answer:   No    Order Specific Question:   Is Albumin medication needed?    Answer:   No    Order Specific Question:   Reason for Exam (SYMPTOM  OR DIAGNOSIS REQUIRED)    Answer:   maligant ascites    Order Specific Question:   Preferred imaging location?    Answer:   [facility]    All questions were answered. The  patient knows to call the clinic with any problems, questions or concerns. The total time spent in the appointment was 30 minutes encounter with patients including review of chart and various tests results, discussions about plan of care and coordination of care plan   [name], MD 09/02/2020 11:12 AM  INTERVAL HISTORY: Please see below for problem oriented charting. She returns with her husband for further follow-up She is not feeling well She has lost some weight She felt weak Her oral intake is poor Her husband documented oral intake approximately 800 to 900 cal/day She also have frequent nasal congestion and nasal drip She had severe constipation last week but resolved after aggressive laxative therapy  SUMMARY OF ONCOLOGIC HISTORY: Oncology History  Ovarian ca ([place])  07/22/2020 Imaging   Ct abdomen and pelvis 1. Large calcified mass in the pelvis that is new from 2010. There is associated large volume ascites with mild peritoneal nodularity/calcification, primarily worrisome for a calcifying ovarian malignancy. 2. Small right pleural effusion with few calcifications in the anterior juxta diaphragmatic space, possibly related to #1. 3. Nonenlarged but calcified periaortic lymph nodes which are also worrisome for spread of disease.   07/29/2020 Procedure   Successful ultrasound-guided diagnostic and therapeutic paracentesis yielding 5 liters of peritoneal fluid.   07/29/2020 Tumor Marker   Patient's tumor was tested for the following markers: CA-125 Results of the tumor marker test revealed 771  07/30/2020 Initial Diagnosis   Ovarian ca ([place])   07/31/2020 Imaging   1. Calcified periesophageal and juxtadiaphragmatic nodules are indicative of metastatic disease. 2. Small right and trace left pleural effusions. 3. Ascites and calcified upper abdominal adenopathy/peritoneal nodules, better assessed on 07/22/2020. 4. Aortic atherosclerosis (ICD10-I70.0). Coronary artery  calcification.   07/31/2020 Cancer Staging   Staging form: Ovary, Fallopian Tube, and Primary Peritoneal Carcinoma, AJCC 8th Edition - Clinical stage from 07/31/2020: Stage IV (cT3, cN1, cM1) - Signed by [name], MD on 07/31/2020 Stage prefix: Initial diagnosis   08/01/2020 Tumor Marker   Patient's tumor was tested for the following markers: CA-125 Results of the tumor marker test revealed 641   08/05/2020 -  Chemotherapy    Patient is on Treatment Plan: OVARIAN CARBOPLATIN (AUC 6) / PACLITAXEL (175) Q21D X 6 CYCLES      08/26/2020 Tumor Marker   Patient's tumor was tested for the following markers: CA-125 Results of the tumor marker test revealed 663.     REVIEW OF SYSTEMS:   Constitutional: Denies fevers, chills  Eyes: Denies blurriness of vision Ears, nose, mouth, throat, and face: Denies mucositis or sore throat Respiratory: Denies cough, dyspnea or wheezes Cardiovascular: Denies palpitation, chest discomfort or lower extremity swelling Skin: Denies abnormal skin rashes Lymphatics: Denies new lymphadenopathy or easy bruising Behavioral/Psych: Mood is stable, no new changes  All other systems were reviewed with the patient and are negative.  I have reviewed the past medical history, past surgical history, social history and family history with the patient and they are unchanged from previous note.  ALLERGIES:  is allergic to cephalexin, hydrocodone-guaifenesin, tape, tramadol, and tramadol hcl.  MEDICATIONS:  Current Outpatient Medications  Medication Sig Dispense Refill  . acetaminophen (TYLENOL) 500 MG chewable tablet Chew 500 mg by mouth. Takes up to 6 a day.    Marland Kitchen CALCIUM PO Take 1,000 mg by mouth daily as needed.    . Cholecalciferol (VITAMIN D3 PO) Take 2,000 Units by mouth daily.     . Coenzyme Q10 (CO Q 10 PO) Take 300 mg by mouth daily.     Marland Kitchen dexamethasone (DECADRON) 4 MG tablet Take 2 tabs at the night before and 2 tab the morning of chemotherapy, every 3 weeks, by  mouth x 6 cycles 36 tablet 6  . magnesium oxide (MAG-OX) 400 MG tablet Take by mouth daily.     . Melatonin 1 MG CAPS Take 5 mg by mouth at bedtime as needed.     . Multiple Vitamin (MULTI-VITAMINS) TABS Take 1 tablet by mouth daily.    . ondansetron (ZOFRAN) 8 MG tablet Take 1 tablet (8 mg total) by mouth every 8 (eight) hours as needed for refractory nausea / vomiting. Start on day 3 after carboplatin chemo. 30 tablet 1  . pravastatin (PRAVACHOL) 20 MG tablet Take 1 tablet (20 mg total) by mouth daily. 90 tablet 3  . predniSONE (DELTASONE) 1 MG tablet Take 9 tablets by mouth daily. 07/25/2020-Titrating down over the next 2 weeks    . prochlorperazine (COMPAZINE) 10 MG tablet Take 1 tablet (10 mg total) by mouth every 6 (six) hours as needed (Nausea or vomiting). 30 tablet 1   No current facility-administered medications for this visit.    PHYSICAL EXAMINATION: ECOG PERFORMANCE STATUS: 2 - Symptomatic, <50% confined to bed  Vitals:   09/02/20 1017  BP: 120/72  Pulse: 91  Resp: 18  Temp: 98.7 F (37.1 C)  SpO2: 99%   Filed Weights  09/02/20 1017  Weight: 145 lb 6.4 oz (66 kg)    GENERAL:alert, no distress and comfortable SKIN: skin color, texture, turgor are normal, no rashes or significant lesions EYES: normal, Conjunctiva are pink and non-injected, sclera clear OROPHARYNX:no exudate, no erythema and lips, buccal mucosa, and tongue normal  NECK: supple, thyroid normal size, non-tender, without nodularity LYMPH:  no palpable lymphadenopathy in the cervical, axillary or inguinal LUNGS: clear to auscultation and percussion with normal breathing effort HEART: regular rate & rhythm and no murmurs and no lower extremity edema ABDOMEN:abdomen soft, distended with ascites  musculoskeletal:no cyanosis of digits and no clubbing  NEURO: alert & oriented x 3 with fluent speech, no focal motor/sensory deficits  LABORATORY DATA:  I have reviewed the data as listed    Component Value  Date/Time   NA 136 08/26/2020 1015   NA 141 05/29/2019 1143   K 4.2 08/26/2020 1015   CL 106 08/26/2020 1015   CO2 23 08/26/2020 1015   GLUCOSE 98 08/26/2020 1015   BUN 33 (H) 08/26/2020 1015   BUN 29 (H) 05/29/2019 1143   CREATININE 0.82 08/26/2020 1015   CALCIUM 9.0 08/26/2020 1015   PROT 6.4 (L) 08/26/2020 1015   ALBUMIN 3.1 (L) 08/26/2020 1015   AST 15 08/26/2020 1015   ALT 13 08/26/2020 1015   ALKPHOS 86 08/26/2020 1015   BILITOT 0.3 08/26/2020 1015   GFRNONAA >60 08/26/2020 1015   GFRAA 74 05/29/2019 1143    No results found for: SPEP, UPEP  Lab Results  Component Value Date   WBC 6.0 08/26/2020   NEUTROABS 4.6 08/26/2020   HGB 11.9 (L) 08/26/2020   HCT 35.8 (L) 08/26/2020   MCV 84.8 08/26/2020   PLT 203 08/26/2020      Chemistry      Component Value Date/Time   NA 136 08/26/2020 1015   NA 141 05/29/2019 1143   K 4.2 08/26/2020 1015   CL 106 08/26/2020 1015   CO2 23 08/26/2020 1015   BUN 33 (H) 08/26/2020 1015   BUN 29 (H) 05/29/2019 1143   CREATININE 0.82 08/26/2020 1015      Component Value Date/Time   CALCIUM 9.0 08/26/2020 1015   ALKPHOS 86 08/26/2020 1015   AST 15 08/26/2020 1015   ALT 13 08/26/2020 1015   BILITOT 0.3 08/26/2020 1015

## 2020-09-02 NOTE — Assessment & Plan Note (Signed)
This is likely due to sinus allergies I recommend over-the-counter remedies

## 2020-09-02 NOTE — Assessment & Plan Note (Signed)
This is likely due to poor oral intake Suggested high calorie diet I am hopeful that with paracentesis, her symptoms will improve

## 2020-09-02 NOTE — Assessment & Plan Note (Signed)
She is getting quite uncomfortable with her malignant ascites I recommend urgent paracentesis tomorrow I will see her again next week for further follow-up

## 2020-09-03 ENCOUNTER — Ambulatory Visit (HOSPITAL_COMMUNITY)
Admission: RE | Admit: 2020-09-03 | Discharge: 2020-09-03 | Disposition: A | Discharge status: Home / Self Care | Payer: Medicare Other | Source: Ambulatory Visit | Attending: Hematology and Oncology | Admitting: Hematology and Oncology

## 2020-09-03 DIAGNOSIS — R180 Malignant ascites: Secondary | ICD-10-CM | POA: Diagnosis not present

## 2020-09-03 DIAGNOSIS — C569 Malignant neoplasm of unspecified ovary: Secondary | ICD-10-CM | POA: Diagnosis not present

## 2020-09-03 MED ORDER — LIDOCAINE HCL 1 % IJ SOLN
INTRAMUSCULAR | Status: AC
  Filled 2020-09-03: qty 20

## 2020-09-03 NOTE — Procedures (Signed)
Ultrasound-guided  therapeutic paracentesis performed yielding 5 liters of yellow fluid. No immediate complications. EBL < 1 cc.

## 2020-09-04 DIAGNOSIS — M8580 Other specified disorders of bone density and structure, unspecified site: Secondary | ICD-10-CM | POA: Diagnosis not present

## 2020-09-04 DIAGNOSIS — I4891 Unspecified atrial fibrillation: Secondary | ICD-10-CM | POA: Diagnosis not present

## 2020-09-04 DIAGNOSIS — N183 Chronic kidney disease, stage 3 unspecified: Secondary | ICD-10-CM | POA: Diagnosis not present

## 2020-09-04 DIAGNOSIS — E7800 Pure hypercholesterolemia, unspecified: Secondary | ICD-10-CM | POA: Diagnosis not present

## 2020-09-05 ENCOUNTER — Telehealth: Payer: Self-pay

## 2020-09-05 ENCOUNTER — Other Ambulatory Visit: Payer: Self-pay | Admitting: Hematology and Oncology

## 2020-09-05 MED ORDER — DEXAMETHASONE 4 MG PO TABS: 4.0000 mg | ORAL_TABLET | Freq: Two times a day (BID) | ORAL | 0 refills | Status: DC

## 2020-09-05 MED FILL — DEXAMETHASONE 4 MG TABLET: 4 | 30 days supply | Qty: 60 | Fill #0

## 2020-09-05 NOTE — Telephone Encounter (Signed)
I recommend daily dexamethasone 1 tab in the morning second tab in the afternoon (she should have it but different instructions to take only before chemo) I will send new prescription to her pharmacy

## 2020-09-05 NOTE — Telephone Encounter (Signed)
Husband called and ask for a call back.  Called back. She is complaining of nausea with eating food. Denies vomiting. She is almost afraid to eating food. She is complaining of no appetite and she is unable to force herself to eat. She has been drinking x 2 chocolate protein shakes with 42 grams to each shake a day. She has been taking compazine only for nausea. Instructed to take Zofran q 8 hours and alternate compazine if needed. Denies constipation, bm today. She is able to drink. Instructed to drink Gatorade/ or Pedialyte.  Asking for something for her appetite.

## 2020-09-05 NOTE — Telephone Encounter (Signed)
Called and given below message. She verbalized understanding. Instructed to call the office back if needed.

## 2020-09-07 ENCOUNTER — Encounter (HOSPITAL_COMMUNITY): Payer: Self-pay | Admitting: Emergency Medicine

## 2020-09-07 ENCOUNTER — Other Ambulatory Visit: Payer: Self-pay

## 2020-09-07 ENCOUNTER — Emergency Department (HOSPITAL_COMMUNITY)
Admission: EM | Admit: 2020-09-07 | Discharge: 2020-09-08 | Disposition: A | Discharge status: Home / Self Care | Payer: Medicare Other | Source: Home / Self Care | Attending: Emergency Medicine | Admitting: Emergency Medicine

## 2020-09-07 DIAGNOSIS — Z87891 Personal history of nicotine dependence: Secondary | ICD-10-CM | POA: Insufficient documentation

## 2020-09-07 DIAGNOSIS — N183 Chronic kidney disease, stage 3 unspecified: Secondary | ICD-10-CM | POA: Insufficient documentation

## 2020-09-07 DIAGNOSIS — N281 Cyst of kidney, acquired: Secondary | ICD-10-CM | POA: Diagnosis not present

## 2020-09-07 DIAGNOSIS — R1111 Vomiting without nausea: Secondary | ICD-10-CM | POA: Diagnosis not present

## 2020-09-07 DIAGNOSIS — R748 Abnormal levels of other serum enzymes: Secondary | ICD-10-CM | POA: Insufficient documentation

## 2020-09-07 DIAGNOSIS — Z8543 Personal history of malignant neoplasm of ovary: Secondary | ICD-10-CM | POA: Insufficient documentation

## 2020-09-07 DIAGNOSIS — Z96611 Presence of right artificial shoulder joint: Secondary | ICD-10-CM | POA: Insufficient documentation

## 2020-09-07 DIAGNOSIS — R9431 Abnormal electrocardiogram [ECG] [EKG]: Secondary | ICD-10-CM | POA: Diagnosis not present

## 2020-09-07 DIAGNOSIS — Z96641 Presence of right artificial hip joint: Secondary | ICD-10-CM | POA: Insufficient documentation

## 2020-09-07 DIAGNOSIS — C569 Malignant neoplasm of unspecified ovary: Secondary | ICD-10-CM | POA: Diagnosis not present

## 2020-09-07 DIAGNOSIS — D61810 Antineoplastic chemotherapy induced pancytopenia: Secondary | ICD-10-CM | POA: Diagnosis not present

## 2020-09-07 DIAGNOSIS — R180 Malignant ascites: Secondary | ICD-10-CM

## 2020-09-07 DIAGNOSIS — R109 Unspecified abdominal pain: Secondary | ICD-10-CM | POA: Diagnosis not present

## 2020-09-07 DIAGNOSIS — R1011 Right upper quadrant pain: Secondary | ICD-10-CM

## 2020-09-07 DIAGNOSIS — K567 Ileus, unspecified: Secondary | ICD-10-CM | POA: Diagnosis not present

## 2020-09-07 DIAGNOSIS — Z79899 Other long term (current) drug therapy: Secondary | ICD-10-CM | POA: Insufficient documentation

## 2020-09-07 DIAGNOSIS — D1779 Benign lipomatous neoplasm of other sites: Secondary | ICD-10-CM | POA: Diagnosis not present

## 2020-09-07 DIAGNOSIS — D171 Benign lipomatous neoplasm of skin and subcutaneous tissue of trunk: Secondary | ICD-10-CM | POA: Diagnosis not present

## 2020-09-07 DIAGNOSIS — D649 Anemia, unspecified: Secondary | ICD-10-CM | POA: Insufficient documentation

## 2020-09-07 DIAGNOSIS — K56600 Partial intestinal obstruction, unspecified as to cause: Secondary | ICD-10-CM | POA: Diagnosis not present

## 2020-09-07 DIAGNOSIS — R1010 Upper abdominal pain, unspecified: Secondary | ICD-10-CM | POA: Diagnosis not present

## 2020-09-07 DIAGNOSIS — K6389 Other specified diseases of intestine: Secondary | ICD-10-CM | POA: Diagnosis not present

## 2020-09-07 DIAGNOSIS — Z20822 Contact with and (suspected) exposure to covid-19: Secondary | ICD-10-CM | POA: Diagnosis not present

## 2020-09-07 DIAGNOSIS — R112 Nausea with vomiting, unspecified: Secondary | ICD-10-CM | POA: Diagnosis not present

## 2020-09-07 LAB — CBC WITH DIFFERENTIAL/PLATELET
Abs Immature Granulocytes: 0.01 10*3/uL (ref 0.00–0.07)
Basophils Absolute: 0 10*3/uL (ref 0.0–0.1)
Basophils Relative: 0 %
Eosinophils Absolute: 0 10*3/uL (ref 0.0–0.5)
Eosinophils Relative: 0 %
HCT: 34.3 % — ABNORMAL LOW (ref 36.0–46.0)
Hemoglobin: 11.1 g/dL — ABNORMAL LOW (ref 12.0–15.0)
Immature Granulocytes: 0 %
Lymphocytes Relative: 31 %
Lymphs Abs: 1.1 10*3/uL (ref 0.7–4.0)
MCH: 28.5 pg (ref 26.0–34.0)
MCHC: 32.4 g/dL (ref 30.0–36.0)
MCV: 87.9 fL (ref 80.0–100.0)
Monocytes Absolute: 0.6 10*3/uL (ref 0.1–1.0)
Monocytes Relative: 16 %
Neutro Abs: 1.8 10*3/uL (ref 1.7–7.7)
Neutrophils Relative %: 53 %
Platelets: 277 10*3/uL (ref 150–400)
RBC: 3.9 MIL/uL (ref 3.87–5.11)
RDW: 15.3 % (ref 11.5–15.5)
WBC: 3.5 10*3/uL — ABNORMAL LOW (ref 4.0–10.5)
nRBC: 0 % (ref 0.0–0.2)

## 2020-09-07 MED ORDER — ONDANSETRON HCL 4 MG/2ML IJ SOLN
4.0000 mg | Freq: Once | INTRAMUSCULAR | Status: AC | PRN
  Administered 2020-09-07: 4 mg via INTRAVENOUS
  Filled 2020-09-07: qty 2

## 2020-09-07 MED ORDER — MORPHINE SULFATE (PF) 4 MG/ML IV SOLN
4.0000 mg | Freq: Once | INTRAVENOUS | Status: AC
  Administered 2020-09-07: 4 mg via INTRAVENOUS
  Filled 2020-09-07: qty 1

## 2020-09-07 NOTE — ED Notes (Signed)
Requested patient to urinate. Patient does not have to go at this time.

## 2020-09-07 NOTE — ED Triage Notes (Signed)
Patient states today she started dexamethasone today prescribed by doctor. Patient states she took it this afternoon, and after that began having some RUQ swelling. Patient states pain is intermittent. Denies N/V/D. States tender to touch.

## 2020-09-07 NOTE — ED Provider Notes (Signed)
[place] DEPT Provider Note   CSN: [id number] Arrival date & time: 09/07/20  2229    History Chief Complaint  Patient presents with  . Abdominal Pain    [name] is a 80 y.o. female.  The history is provided by the patient.  Abdominal Pain She has history of hyperlipidemia, ovarian cancer with malignant ascites, pancreatitis, chronic kidney disease, and comes in because of right upper quadrant pain which started this evening.  She rates pain at 7/10.  There is no radiation of pain.  She denies nausea or vomiting.  Pain does seem to wax and wane on its own.  She had recently started taking dexamethasone 4 mg twice a day and wonders if it might be related to the medication.  She denies fever, chills, sweats.  Of note, she has noted some swelling in the right subcostal area which is separate from her ascites.  Past Medical History:  Diagnosis Date  . Arthritis    right shoulder replacement, right replacement   . Atrial fibrillation ([place])   . Chronic pancreatitis ([place])   . Constipation   . Osteopenia   . Pancreatitis 1990s  . Syncope 03/2011    Patient Active Problem List   Diagnosis Date Noted  . Nasal congestion 09/02/2020  . Anorexia 09/02/2020  . Generalized weakness 09/02/2020  . Weight loss, abnormal 07/31/2020  . Ovarian ca ([place]) 07/30/2020  . Pelvic mass 07/26/2020  . Malignant neoplasm of other specified female genital organs ([place])  07/26/2020  . Chronic kidney disease, stage 3 unspecified ([place]) 07/24/2020  . Allergic rhinitis 07/24/2020  . Amnesia 07/24/2020  . Malignant ascites 07/24/2020  . Chronic sinusitis 07/24/2020  . Constipation 07/24/2020  . Eczema 07/24/2020  . Hearing loss 07/24/2020  . History of pancreatitis 07/24/2020  . Iron deficiency 07/24/2020  . Knee pain 07/24/2020  . Nocturia 07/24/2020  . Osteopenia 07/24/2020  . Polymyalgia rheumatica ([place]) 07/24/2020  . Pure hypercholesterolemia 07/24/2020  .  Rosacea 07/24/2020  . Sacroiliitis, not elsewhere classified ([place]) 07/24/2020  . Sleep disturbance 07/24/2020  . Spontaneous ecchymosis 07/24/2020  . Vitamin D deficiency 07/24/2020  . Prolapsed internal hemorrhoids 11/04/2017  . Primary osteoarthritis of both shoulders 07/09/2016  . Status post replacement of right shoulder joint 07/09/2016  . Primary osteoarthritis of one hip, right 12/25/2014  . Dermatochalasis 07/22/2012  . Atrial fibrillation ([place]) 09/17/2011  . Hyperlipidemia 09/17/2011    Past Surgical History:  Procedure Laterality Date  . APPENDECTOMY  1955  . CHOLECYSTECTOMY  1998  . CYSTECTOMY  1981   [place]  . JOINT REPLACEMENT     right shoulder 2008; right hip 2016  . [place]  . TOTAL HIP ARTHROPLASTY Right 11/2014   [facility], [place], [place]     [place] History    Gravida  2   Para  2   Term      Preterm      AB      Living        SAB      IAB      Ectopic      Multiple      Live Births              Family History  Problem Relation Age of Onset  . Dementia Mother   . Memory loss Brother   . Hodgkin's lymphoma Father   . Kidney failure Brother   . Cancer Paternal Grandfather  unknown  . Breast cancer Maternal Aunt     Social History   Tobacco Use  . Smoking status: Former Smoker    Packs/day: 2.50    Types: Cigarettes    Quit date: 1980    Years since quitting: 42.2  . Smokeless tobacco: Never Used  Vaping Use  . Vaping Use: Never used  Substance Use Topics  . Alcohol use: Not Currently  . Drug use: Never    Home Medications Prior to Admission medications   Medication Sig Start Date End Date Taking? Authorizing Provider  acetaminophen (TYLENOL) 500 MG chewable tablet Chew 500 mg by mouth. Takes up to 6 a day.    [provider]  CALCIUM PO Take 1,000 mg by mouth daily as needed.    [provider]  Cholecalciferol (VITAMIN D3 PO) Take 2,000 Units by mouth daily.      [provider]  Coenzyme Q10 (CO Q 10 PO) Take 300 mg by mouth daily.     [provider]  dexamethasone (DECADRON) 4 MG tablet Take 1 tablet (4 mg total) by mouth 2 (two) times daily. 09/05/20   [name], MD  magnesium oxide (MAG-OX) 400 MG tablet Take by mouth daily.     [provider]  Melatonin 1 MG CAPS Take 5 mg by mouth at bedtime as needed.     [provider]  Multiple Vitamin (MULTI-VITAMINS) TABS Take 1 tablet by mouth daily.    [provider]  ondansetron (ZOFRAN) 8 MG tablet Take 1 tablet (8 mg total) by mouth every 8 (eight) hours as needed for refractory nausea / vomiting. Start on day 3 after carboplatin chemo. 07/30/20   [name], MD  pravastatin (PRAVACHOL) 20 MG tablet Take 1 tablet (20 mg total) by mouth daily. 04/19/20   [name], MD  predniSONE (DELTASONE) 1 MG tablet Take 9 tablets by mouth daily. 07/25/2020-Titrating down over the next 2 weeks 06/25/20   [provider]  prochlorperazine (COMPAZINE) 10 MG tablet Take 1 tablet (10 mg total) by mouth every 6 (six) hours as needed (Nausea or vomiting). 07/30/20   [name], MD    Allergies    Cephalexin, Hydrocodone-guaifenesin, Tape, Tramadol, and Tramadol hcl  Review of Systems   Review of Systems  Gastrointestinal: Positive for abdominal pain.  All other systems reviewed and are negative.   Physical Exam Updated Vital Signs BP (!) 150/73 (BP Location: Right Arm)   Pulse 75   Temp 98.2 F (36.8 C) (Oral)   Resp 20   SpO2 100%   Physical Exam Vitals and nursing note reviewed.   80 year old female, resting comfortably and in no acute distress. Vital signs are significant for elevated blood pressure. Oxygen saturation is 100%, which is normal. Head is normocephalic and atraumatic. PERRLA, EOMI. Oropharynx is clear. Neck is nontender and supple without adenopathy or JVD. Back is nontender and there is no CVA tenderness. Lungs are clear without rales,  wheezes, or rhonchi. Chest is nontender. Heart has regular rate and rhythm without murmur. Abdomen is soft, with moderate to large amount of ascites.  There is fairly well localized tenderness in the right upper abdomen.  There is no rebound or guarding.  There are no masses or hepatosplenomegaly and peristalsis is normoactive.  There is an area of slight swelling in the right subcostal area. Extremities have 2+ edema, full range of motion is present. Skin is warm and dry without rash. Neurologic: Mental status is normal,  cranial nerves are intact, there are no motor or sensory deficits.  ED Results / Procedures / Treatments   Labs (all labs ordered are listed, but only abnormal results are displayed) Labs Reviewed  LIPASE, BLOOD - Abnormal; Notable for the following components:      Result Value   Lipase 56 (*)    All other components within normal limits  COMPREHENSIVE METABOLIC PANEL - Abnormal; Notable for the following components:   Glucose, Bld 110 (*)    BUN 36 (*)    Calcium 8.8 (*)    Total Protein 6.3 (*)    Albumin 3.3 (*)    All other components within normal limits  URINALYSIS, ROUTINE W REFLEX MICROSCOPIC - Abnormal; Notable for the following components:   Leukocytes,Ua TRACE (*)    All other components within normal limits  CBC WITH DIFFERENTIAL/PLATELET - Abnormal; Notable for the following components:   WBC 3.5 (*)    Hemoglobin 11.1 (*)    HCT 34.3 (*)    All other components within normal limits   Radiology CT ABDOMEN PELVIS W CONTRAST  Result Date: 09/08/2020 CLINICAL DATA:  Right upper quadrant swelling, intermittent pain, bearing cancer with malignant ascites EXAM: CT ABDOMEN AND PELVIS WITH CONTRAST TECHNIQUE: Multidetector CT imaging of the abdomen and pelvis was performed using the standard protocol following bolus administration of intravenous contrast. CONTRAST:  171mL OMNIPAQUE IOHEXOL 300 MG/ML  SOLN COMPARISON:  07/22/2020 FINDINGS: Lower chest: No  acute pleural or parenchymal lung disease. Calcified paraesophageal nodules measuring up to 6 mm unchanged, consistent with known metastatic ovarian cancer. Hepatobiliary: Stable indeterminate subcentimeter liver hypodensities. No intrahepatic biliary duct dilation. Gallbladder surgically absent. Pancreas: Unremarkable. No pancreatic ductal dilatation or surrounding inflammatory changes. Spleen: Stable indeterminate hypodensity posterior subcapsular region of the spleen, likely a small hemangioma. Adrenals/Urinary Tract: Stable left renal cyst. The kidneys enhance normally and symmetrically without obstructive uropathy. No urinary tract calculi. The adrenals are unremarkable. The bladder is decompressed, limiting its evaluation. Stomach/Bowel: Fluid-filled mildly dilated loops of jejunum within the left mid abdomen measure up to 2.4 cm in size, nonspecific. There are some scattered gas fluid levels consistent with regional ileus. Distal small bowel is relatively decompressed. There is gas and stool throughout the colon to the level of the rectum. Vascular/Lymphatic: Mild atherosclerosis unchanged. Multiple calcified retroperitoneal nodules consistent with metastatic disease. No new adenopathy. Reproductive: Enlarged calcified mass arising from the right adnexa measuring approximately 14 x 15 cm, not significantly changed since prior study. Calcified peritoneal masses within the central mesentery are again noted, without significant change since prior exam. Largest calcified mass within the central abdomen image 40/2 measures 3.4 x 2.4 cm, previous having measured approximately 3.7 x 2.1 cm. Findings are consistent with known history of metastatic ovarian cancer. Other: There is moderate ascites throughout the abdomen and pelvis, decreased since previous CT scan. No free intraperitoneal gas. No abdominal wall hernia. Musculoskeletal: Unremarkable right hip arthroplasty. No acute or destructive bony lesions. Stable  intramuscular lipoma left lateral abdominal wall. Reconstructed images demonstrate no additional findings. IMPRESSION: 1. Large calcified pelvic mass arising from the right adnexa, consistent with known history of ovarian cancer. 2. Stable calcified metastatic implants throughout the mesentery. 3. Stable calcified nodules within the retroperitoneum, epiphrenic region, and paraesophageal regions, consistent with metastatic disease. 4. Moderate ascites. 5. Likely ileus involving the proximal to mid jejunum. No evidence of high-grade obstruction. Electronically Signed   By: Randa Ngo M.D.   On: 09/08/2020 03:59    Procedures  Procedures   Medications Ordered in ED Medications  ondansetron (ZOFRAN) injection 4 mg (has no administration in time range)    ED Course  I have reviewed the triage vital signs and the nursing notes.  Pertinent labs & imaging results that were available during my care of the patient were reviewed by me and considered in my medical decision making (see chart for details).  MDM Rules/Calculators/A&P Abdominal pain in the setting of malignant ascites.  Exam is benign but check screening labs.  She will be given morphine for pain.  Old records are reviewed confirming recent diagnosis of ovarian cancer with malignant ascites being treated with paclitaxel and carboplatin.  She had a paracentesis done on March 8.  On reviewing CT scan from 07/22/2020, ascites is somewhat asymmetric and does cause some swelling in the right subcostal area which is where she has noted the swelling today.  Labs show mild leukopenia and mildly elevated lipase.  BUN is mildly elevated but unchanged from baseline.  Patient had worsening abdominal pain and nausea and received additional morphine plus prochlorperazine.  Will send for CT of abdomen and pelvis.  She got good relief of pain and nausea with above-noted medication.  CT scan shows no acute process.  Previously none right adnexal mass and  ascites are present.  I suspect her pain is probably related to her underlying malignancy.  She is discharged with prescriptions for prochlorperazine and oxycodone, follow-up with her oncologist.  Return precautions discussed.  Final Clinical Impression(s) / ED Diagnoses Final diagnoses:  RUQ abdominal pain  Malignant ascites  Normochromic normocytic anemia  Elevated lipase    Rx / DC Orders ED Discharge Orders         Ordered    prochlorperazine (COMPAZINE) 10 MG tablet  Every 6 hours PRN        09/08/20 0421    oxyCODONE (ROXICODONE) 5 MG immediate release tablet  Every 4 hours PRN        09/08/20 0421    oxyCODONE (ROXICODONE) 5 MG immediate release tablet  Every 4 hours PRN        09/08/20 0422    prochlorperazine (COMPAZINE) 10 MG tablet  Every 6 hours PRN        09/08/20 6629           Delora Fuel, MD 47/65/46 [phone]

## 2020-09-08 ENCOUNTER — Emergency Department (HOSPITAL_COMMUNITY): Payer: Medicare Other

## 2020-09-08 DIAGNOSIS — N281 Cyst of kidney, acquired: Secondary | ICD-10-CM | POA: Diagnosis not present

## 2020-09-08 DIAGNOSIS — D1779 Benign lipomatous neoplasm of other sites: Secondary | ICD-10-CM | POA: Diagnosis not present

## 2020-09-08 DIAGNOSIS — K6389 Other specified diseases of intestine: Secondary | ICD-10-CM | POA: Diagnosis not present

## 2020-09-08 DIAGNOSIS — D171 Benign lipomatous neoplasm of skin and subcutaneous tissue of trunk: Secondary | ICD-10-CM | POA: Diagnosis not present

## 2020-09-08 LAB — COMPREHENSIVE METABOLIC PANEL
ALT: 22 U/L (ref 0–44)
AST: 19 U/L (ref 15–41)
Albumin: 3.3 g/dL — ABNORMAL LOW (ref 3.5–5.0)
Alkaline Phosphatase: 70 U/L (ref 38–126)
Anion gap: 12 (ref 5–15)
BUN: 36 mg/dL — ABNORMAL HIGH (ref 8–23)
CO2: 24 mmol/L (ref 22–32)
Calcium: 8.8 mg/dL — ABNORMAL LOW (ref 8.9–10.3)
Chloride: 101 mmol/L (ref 98–111)
Creatinine, Ser: 0.83 mg/dL (ref 0.44–1.00)
GFR, Estimated: >60 mL/min (ref >60)
Glucose, Bld: 110 mg/dL — ABNORMAL HIGH (ref 70–99)
Potassium: 4.1 mmol/L (ref 3.5–5.1)
Sodium: 137 mmol/L (ref 135–145)
Total Bilirubin: 0.4 mg/dL (ref 0.3–1.2)
Total Protein: 6.3 g/dL — ABNORMAL LOW (ref 6.5–8.1)

## 2020-09-08 LAB — URINALYSIS, ROUTINE W REFLEX MICROSCOPIC
Bacteria, UA: NONE SEEN
Bilirubin Urine: NEGATIVE
Glucose, UA: NEGATIVE mg/dL
Hgb urine dipstick: NEGATIVE
Ketones, ur: NEGATIVE mg/dL
Nitrite: NEGATIVE
Protein, ur: NEGATIVE mg/dL
Specific Gravity, Urine: 1.017 (ref 1.005–1.030)
pH: 5 (ref 5.0–8.0)

## 2020-09-08 LAB — LIPASE, BLOOD: Lipase: 56 U/L — ABNORMAL HIGH (ref 11–51)

## 2020-09-08 MED ORDER — OXYCODONE HCL 5 MG PO TABS: 5.0000 mg | ORAL_TABLET | ORAL | 0 refills | Status: DC | PRN

## 2020-09-08 MED ORDER — PROCHLORPERAZINE EDISYLATE 10 MG/2ML IJ SOLN
10.0000 mg | Freq: Once | INTRAMUSCULAR | Status: AC
  Administered 2020-09-08: 10 mg via INTRAVENOUS
  Filled 2020-09-08: qty 2

## 2020-09-08 MED ORDER — PROCHLORPERAZINE MALEATE 10 MG PO TABS: 10.0000 mg | ORAL_TABLET | Freq: Four times a day (QID) | ORAL | 0 refills | Status: DC | PRN

## 2020-09-08 MED ORDER — PROCHLORPERAZINE MALEATE 10 MG PO TABS
10.0000 mg | ORAL_TABLET | Freq: Four times a day (QID) | ORAL | 0 refills | Status: DC | PRN
Start: 2020-09-08 — End: 2020-09-14

## 2020-09-08 MED ORDER — MORPHINE SULFATE (PF) 4 MG/ML IV SOLN
4.0000 mg | Freq: Once | INTRAVENOUS | Status: AC
  Administered 2020-09-08: 4 mg via INTRAVENOUS
  Filled 2020-09-08: qty 1

## 2020-09-08 MED ORDER — IOHEXOL 300 MG/ML  SOLN
100.0000 mL | Freq: Once | INTRAMUSCULAR | Status: AC | PRN
  Administered 2020-09-08: 100 mL via INTRAVENOUS

## 2020-09-08 NOTE — Discharge Instructions (Signed)
Your evaluation did not show an obvious cause for your pain, but did not show anything serious.  Take the pain medication and nausea medication as needed.  Please be aware that all pain medications can tend to constipate you, and take appropriate measures to keep your bowels functioning.  Return to the emergency department if pain gets worse, you start running a fever, or you are vomiting in spite of the medication.  Otherwise, follow-up with your oncologist to continue your treatments.

## 2020-09-08 NOTE — ED Notes (Signed)
Patient transported to CT 

## 2020-09-09 ENCOUNTER — Other Ambulatory Visit: Payer: Self-pay

## 2020-09-09 ENCOUNTER — Emergency Department (HOSPITAL_COMMUNITY): Payer: Medicare Other

## 2020-09-09 ENCOUNTER — Encounter (HOSPITAL_COMMUNITY): Payer: Self-pay

## 2020-09-09 ENCOUNTER — Telehealth: Payer: Self-pay

## 2020-09-09 ENCOUNTER — Inpatient Hospital Stay (HOSPITAL_COMMUNITY)
Admission: EM | Admit: 2020-09-09 | Discharge: 2020-09-14 | DRG: 388 | Disposition: A | Discharge status: Home / Self Care | Payer: Medicare Other | Attending: Internal Medicine | Admitting: Internal Medicine

## 2020-09-09 ENCOUNTER — Telehealth: Payer: Self-pay | Admitting: Hematology and Oncology

## 2020-09-09 DIAGNOSIS — K56609 Unspecified intestinal obstruction, unspecified as to partial versus complete obstruction: Secondary | ICD-10-CM

## 2020-09-09 DIAGNOSIS — K5900 Constipation, unspecified: Secondary | ICD-10-CM | POA: Diagnosis present

## 2020-09-09 DIAGNOSIS — I4891 Unspecified atrial fibrillation: Secondary | ICD-10-CM | POA: Diagnosis present

## 2020-09-09 DIAGNOSIS — Z7189 Other specified counseling: Secondary | ICD-10-CM | POA: Diagnosis not present

## 2020-09-09 DIAGNOSIS — D61810 Antineoplastic chemotherapy induced pancytopenia: Secondary | ICD-10-CM

## 2020-09-09 DIAGNOSIS — R627 Adult failure to thrive: Secondary | ICD-10-CM | POA: Diagnosis present

## 2020-09-09 DIAGNOSIS — B888 Other specified infestations: Secondary | ICD-10-CM | POA: Diagnosis present

## 2020-09-09 DIAGNOSIS — R109 Unspecified abdominal pain: Secondary | ICD-10-CM | POA: Insufficient documentation

## 2020-09-09 DIAGNOSIS — N281 Cyst of kidney, acquired: Secondary | ICD-10-CM | POA: Diagnosis not present

## 2020-09-09 DIAGNOSIS — N179 Acute kidney failure, unspecified: Secondary | ICD-10-CM | POA: Diagnosis present

## 2020-09-09 DIAGNOSIS — Z9221 Personal history of antineoplastic chemotherapy: Secondary | ICD-10-CM

## 2020-09-09 DIAGNOSIS — R188 Other ascites: Secondary | ICD-10-CM | POA: Diagnosis not present

## 2020-09-09 DIAGNOSIS — Z6823 Body mass index (BMI) 23.0-23.9, adult: Secondary | ICD-10-CM

## 2020-09-09 DIAGNOSIS — C569 Malignant neoplasm of unspecified ovary: Secondary | ICD-10-CM | POA: Diagnosis not present

## 2020-09-09 DIAGNOSIS — Z20822 Contact with and (suspected) exposure to covid-19: Secondary | ICD-10-CM | POA: Diagnosis present

## 2020-09-09 DIAGNOSIS — K56600 Partial intestinal obstruction, unspecified as to cause: Principal | ICD-10-CM | POA: Diagnosis present

## 2020-09-09 DIAGNOSIS — M8580 Other specified disorders of bone density and structure, unspecified site: Secondary | ICD-10-CM | POA: Diagnosis present

## 2020-09-09 DIAGNOSIS — R194 Change in bowel habit: Secondary | ICD-10-CM

## 2020-09-09 DIAGNOSIS — Z881 Allergy status to other antibiotic agents status: Secondary | ICD-10-CM

## 2020-09-09 DIAGNOSIS — B889 Infestation, unspecified: Secondary | ICD-10-CM | POA: Diagnosis not present

## 2020-09-09 DIAGNOSIS — Z885 Allergy status to narcotic agent status: Secondary | ICD-10-CM | POA: Diagnosis not present

## 2020-09-09 DIAGNOSIS — K567 Ileus, unspecified: Secondary | ICD-10-CM | POA: Diagnosis present

## 2020-09-09 DIAGNOSIS — R112 Nausea with vomiting, unspecified: Secondary | ICD-10-CM

## 2020-09-09 DIAGNOSIS — Z79890 Hormone replacement therapy (postmenopausal): Secondary | ICD-10-CM

## 2020-09-09 DIAGNOSIS — M79601 Pain in right arm: Secondary | ICD-10-CM | POA: Diagnosis not present

## 2020-09-09 DIAGNOSIS — R7989 Other specified abnormal findings of blood chemistry: Secondary | ICD-10-CM

## 2020-09-09 DIAGNOSIS — R180 Malignant ascites: Secondary | ICD-10-CM | POA: Diagnosis present

## 2020-09-09 DIAGNOSIS — R1111 Vomiting without nausea: Secondary | ICD-10-CM | POA: Diagnosis not present

## 2020-09-09 DIAGNOSIS — Z91048 Other nonmedicinal substance allergy status: Secondary | ICD-10-CM

## 2020-09-09 DIAGNOSIS — L538 Other specified erythematous conditions: Secondary | ICD-10-CM | POA: Diagnosis not present

## 2020-09-09 DIAGNOSIS — M353 Polymyalgia rheumatica: Secondary | ICD-10-CM | POA: Diagnosis present

## 2020-09-09 DIAGNOSIS — Z79899 Other long term (current) drug therapy: Secondary | ICD-10-CM

## 2020-09-09 DIAGNOSIS — T451X5A Adverse effect of antineoplastic and immunosuppressive drugs, initial encounter: Secondary | ICD-10-CM | POA: Diagnosis present

## 2020-09-09 DIAGNOSIS — E785 Hyperlipidemia, unspecified: Secondary | ICD-10-CM | POA: Diagnosis present

## 2020-09-09 DIAGNOSIS — N183 Chronic kidney disease, stage 3 unspecified: Secondary | ICD-10-CM | POA: Diagnosis present

## 2020-09-09 DIAGNOSIS — R110 Nausea: Secondary | ICD-10-CM | POA: Diagnosis not present

## 2020-09-09 DIAGNOSIS — Z87891 Personal history of nicotine dependence: Secondary | ICD-10-CM | POA: Diagnosis not present

## 2020-09-09 DIAGNOSIS — R9431 Abnormal electrocardiogram [ECG] [EKG]: Secondary | ICD-10-CM | POA: Diagnosis not present

## 2020-09-09 DIAGNOSIS — M7989 Other specified soft tissue disorders: Secondary | ICD-10-CM | POA: Diagnosis not present

## 2020-09-09 HISTORY — DX: Malignant (primary) neoplasm, unspecified: C80.1

## 2020-09-09 LAB — COMPREHENSIVE METABOLIC PANEL
ALT: 201 U/L — ABNORMAL HIGH (ref 0–44)
AST: 85 U/L — ABNORMAL HIGH (ref 15–41)
Albumin: 3.4 g/dL — ABNORMAL LOW (ref 3.5–5.0)
Alkaline Phosphatase: 239 U/L — ABNORMAL HIGH (ref 38–126)
Anion gap: 13 (ref 5–15)
BUN: 43 mg/dL — ABNORMAL HIGH (ref 8–23)
CO2: 27 mmol/L (ref 22–32)
Calcium: 8.9 mg/dL (ref 8.9–10.3)
Chloride: 99 mmol/L (ref 98–111)
Creatinine, Ser: 1.02 mg/dL — ABNORMAL HIGH (ref 0.44–1.00)
GFR, Estimated: 55 mL/min — ABNORMAL LOW (ref >60)
Glucose, Bld: 144 mg/dL — ABNORMAL HIGH (ref 70–99)
Potassium: 3.8 mmol/L (ref 3.5–5.1)
Sodium: 139 mmol/L (ref 135–145)
Total Bilirubin: 0.9 mg/dL (ref 0.3–1.2)
Total Protein: 6.6 g/dL (ref 6.5–8.1)

## 2020-09-09 LAB — CBC WITH DIFFERENTIAL/PLATELET
Abs Immature Granulocytes: 0 10*3/uL (ref 0.00–0.07)
Basophils Absolute: 0 10*3/uL (ref 0.0–0.1)
Basophils Relative: 1 %
Eosinophils Absolute: 0 10*3/uL (ref 0.0–0.5)
Eosinophils Relative: 1 %
HCT: 36.2 % (ref 36.0–46.0)
Hemoglobin: 11.9 g/dL — ABNORMAL LOW (ref 12.0–15.0)
Immature Granulocytes: 0 %
Lymphocytes Relative: 22 %
Lymphs Abs: 0.5 10*3/uL — ABNORMAL LOW (ref 0.7–4.0)
MCH: 28.1 pg (ref 26.0–34.0)
MCHC: 32.9 g/dL (ref 30.0–36.0)
MCV: 85.4 fL (ref 80.0–100.0)
Monocytes Absolute: 0.4 10*3/uL (ref 0.1–1.0)
Monocytes Relative: 20 %
Neutro Abs: 1.2 10*3/uL — ABNORMAL LOW (ref 1.7–7.7)
Neutrophils Relative %: 56 %
Platelets: 268 10*3/uL (ref 150–400)
RBC: 4.24 MIL/uL (ref 3.87–5.11)
RDW: 15.8 % — ABNORMAL HIGH (ref 11.5–15.5)
WBC: 2 10*3/uL — ABNORMAL LOW (ref 4.0–10.5)
nRBC: 0 % (ref 0.0–0.2)

## 2020-09-09 LAB — LACTIC ACID, PLASMA: Lactic Acid, Venous: 1.2 mmol/L (ref 0.5–1.9)

## 2020-09-09 LAB — LIPASE, BLOOD: Lipase: 27 U/L (ref 11–51)

## 2020-09-09 LAB — RESP PANEL BY RT-PCR (FLU A&B, COVID) ARPGX2
Influenza A by PCR: NEGATIVE
Influenza B by PCR: NEGATIVE
SARS Coronavirus 2 by RT PCR: NEGATIVE

## 2020-09-09 MED ORDER — ONDANSETRON HCL 4 MG/2ML IJ SOLN
4.0000 mg | Freq: Once | INTRAMUSCULAR | Status: AC
  Administered 2020-09-09: 4 mg via INTRAVENOUS
  Filled 2020-09-09: qty 2

## 2020-09-09 MED ORDER — IOHEXOL 9 MG/ML PO SOLN
1000.0000 mL | ORAL | Status: AC
  Administered 2020-09-09: 1000 mL via ORAL

## 2020-09-09 MED ORDER — SODIUM CHLORIDE 0.9 % IV SOLN
8.0000 mg | Freq: Three times a day (TID) | INTRAVENOUS | Status: DC | PRN
  Administered 2020-09-10 (×2): 8 mg via INTRAVENOUS
  Filled 2020-09-09 (×4): qty 4

## 2020-09-09 MED ORDER — ACETAMINOPHEN 650 MG RE SUPP: 650.0000 mg | Freq: Four times a day (QID) | RECTAL | Status: DC | PRN

## 2020-09-09 MED ORDER — SODIUM CHLORIDE 0.9 % IV BOLUS
1000.0000 mL | Freq: Once | INTRAVENOUS | Status: AC
  Administered 2020-09-09: 1000 mL via INTRAVENOUS

## 2020-09-09 MED ORDER — HYDROMORPHONE HCL 1 MG/ML IJ SOLN
1.0000 mg | INTRAMUSCULAR | Status: DC | PRN
  Administered 2020-09-12: 1 mg via INTRAVENOUS
  Filled 2020-09-09: qty 1

## 2020-09-09 MED ORDER — ACETAMINOPHEN 325 MG PO TABS
650.0000 mg | ORAL_TABLET | Freq: Four times a day (QID) | ORAL | Status: DC | PRN
  Administered 2020-09-09 – 2020-09-13 (×3): 650 mg via ORAL
  Filled 2020-09-09 (×3): qty 2

## 2020-09-09 MED ORDER — PROCHLORPERAZINE EDISYLATE 10 MG/2ML IJ SOLN
10.0000 mg | Freq: Four times a day (QID) | INTRAMUSCULAR | Status: DC | PRN
Start: 2020-09-09 — End: 2020-09-12
  Administered 2020-09-09 – 2020-09-10 (×4): 10 mg via INTRAVENOUS
  Filled 2020-09-09 (×4): qty 2

## 2020-09-09 MED ORDER — LACTATED RINGERS IV SOLN: INTRAVENOUS | Status: AC

## 2020-09-09 MED ORDER — SODIUM CHLORIDE 0.9% FLUSH
3.0000 mL | Freq: Two times a day (BID) | INTRAVENOUS | Status: DC
  Administered 2020-09-09 – 2020-09-12 (×7): 3 mL via INTRAVENOUS

## 2020-09-09 MED ORDER — DEXAMETHASONE SODIUM PHOSPHATE 4 MG/ML IJ SOLN
4.0000 mg | Freq: Once | INTRAMUSCULAR | Status: AC
  Administered 2020-09-09: 4 mg via INTRAVENOUS
  Filled 2020-09-09: qty 1

## 2020-09-09 MED ORDER — ENOXAPARIN SODIUM 40 MG/0.4ML ~~LOC~~ SOLN
40.0000 mg | SUBCUTANEOUS | Status: DC
  Administered 2020-09-09 – 2020-09-13 (×5): 40 mg via SUBCUTANEOUS
  Filled 2020-09-09 (×5): qty 0.4

## 2020-09-09 NOTE — ED Notes (Signed)
PureWick placed on patient. 

## 2020-09-09 NOTE — ED Provider Notes (Signed)
[facility] DEPT Provider Note   CSN: [id number] Arrival date & time: 09/09/20  1032     History Chief Complaint  Patient presents with  . Emesis    [name] is a 80 y.o. female history of ovarian cancer, A. fib, pancreatitis currently on chemotherapy.  Patient presents today for nausea, inability to tolerate p.o. and abdominal pain.  She describes abdominal pain is generalized aching constant worse with eating no alleviating factors no radiation of pain moderate intensity.  She reports it is somewhat improved since her ER visit 2 days ago.  Her main concern today is her continued nausea despite antiemetic use.  She reports has been unable to eat or drink any food for the past 2 days.  Denies fever/chills, fall/injury, chest pain/shortness breath, cough/hemoptysis, dysuria/hematuria, constipation, melena/hematochezia or any additional concerns.  Of note patient's last chemotherapy session around 2 weeks ago.  HPI     Past Medical History:  Diagnosis Date  . Arthritis    right shoulder replacement, right replacement   . Atrial fibrillation ([place])   . Cancer ([place])   . Chronic pancreatitis ([place])   . Constipation   . Osteopenia   . Pancreatitis 1990s  . Syncope 03/2011    Patient Active Problem List   Diagnosis Date Noted  . Nasal congestion 09/02/2020  . Anorexia 09/02/2020  . Generalized weakness 09/02/2020  . Weight loss, abnormal 07/31/2020  . Ovarian ca ([place]) 07/30/2020  . Pelvic mass 07/26/2020  . Malignant neoplasm of other specified female genital organs ([place])  07/26/2020  . Chronic kidney disease, stage 3 unspecified ([place]) 07/24/2020  . Allergic rhinitis 07/24/2020  . Amnesia 07/24/2020  . Malignant ascites 07/24/2020  . Chronic sinusitis 07/24/2020  . Constipation 07/24/2020  . Eczema 07/24/2020  . Hearing loss 07/24/2020  . History of pancreatitis 07/24/2020  . Iron deficiency 07/24/2020  . Knee pain 07/24/2020  .  Nocturia 07/24/2020  . Osteopenia 07/24/2020  . Polymyalgia rheumatica ([place]) 07/24/2020  . Pure hypercholesterolemia 07/24/2020  . Rosacea 07/24/2020  . Sacroiliitis, not elsewhere classified ([place]) 07/24/2020  . Sleep disturbance 07/24/2020  . Spontaneous ecchymosis 07/24/2020  . Vitamin D deficiency 07/24/2020  . Prolapsed internal hemorrhoids 11/04/2017  . Primary osteoarthritis of both shoulders 07/09/2016  . Status post replacement of right shoulder joint 07/09/2016  . Primary osteoarthritis of one hip, right 12/25/2014  . Dermatochalasis 07/22/2012  . Atrial fibrillation ([place]) 09/17/2011  . Hyperlipidemia 09/17/2011    Past Surgical History:  Procedure Laterality Date  . APPENDECTOMY  1955  . CHOLECYSTECTOMY  1998  . CYSTECTOMY  1981   [facility]  . JOINT REPLACEMENT     right shoulder 2008; right hip 2016  . [place]  . TOTAL HIP ARTHROPLASTY Right 11/2014   [facility], [place], [place]     [place] History    Gravida  2   Para  2   Term      Preterm      AB      Living        SAB      IAB      Ectopic      Multiple      Live Births              Family History  Problem Relation Age of Onset  . Dementia Mother   . Memory loss Brother   . Hodgkin's lymphoma Father   . Kidney failure Brother   .  Cancer Paternal Grandfather        unknown  . Breast cancer Maternal Aunt     Social History   Tobacco Use  . Smoking status: Former Smoker    Packs/day: 2.50    Types: Cigarettes    Quit date: 1980    Years since quitting: 42.2  . Smokeless tobacco: Never Used  Vaping Use  . Vaping Use: Never used  Substance Use Topics  . Alcohol use: Not Currently  . Drug use: Never    Home Medications Prior to Admission medications   Medication Sig Start Date End Date Taking? Authorizing Provider  acetaminophen (TYLENOL) 500 MG chewable tablet Chew 500 mg by mouth. Takes up to 6 a day.    [provider]  CALCIUM PO Take  1,000 mg by mouth daily as needed.    [provider]  Cholecalciferol (VITAMIN D3 PO) Take 2,000 Units by mouth daily.     [provider]  Coenzyme Q10 (CO Q 10 PO) Take 300 mg by mouth daily.     [provider]  dexamethasone (DECADRON) 4 MG tablet Take 1 tablet (4 mg total) by mouth 2 (two) times daily. 09/05/20   [name], MD  magnesium oxide (MAG-OX) 400 MG tablet Take by mouth daily.     [provider]  Melatonin 1 MG CAPS Take 5 mg by mouth at bedtime as needed.     [provider]  Multiple Vitamin (MULTI-VITAMINS) TABS Take 1 tablet by mouth daily.    [provider]  ondansetron (ZOFRAN) 8 MG tablet Take 1 tablet (8 mg total) by mouth every 8 (eight) hours as needed for refractory nausea / vomiting. Start on day 3 after carboplatin chemo. 07/30/20   [name], MD  oxyCODONE (ROXICODONE) 5 MG immediate release tablet Take 1 tablet (5 mg total) by mouth every 4 (four) hours as needed for severe pain. 7/67/20   Delora Fuel, MD  oxyCODONE (ROXICODONE) 5 MG immediate release tablet Take 1 tablet (5 mg total) by mouth every 4 (four) hours as needed for severe pain. 9/47/09   Delora Fuel, MD  pravastatin (PRAVACHOL) 20 MG tablet Take 1 tablet (20 mg total) by mouth daily. 04/19/20   [name], MD  prochlorperazine (COMPAZINE) 10 MG tablet Take 1 tablet (10 mg total) by mouth every 6 (six) hours as needed (Nausea or vomiting). 12/25/34   Delora Fuel, MD  prochlorperazine (COMPAZINE) 10 MG tablet Take 1 tablet (10 mg total) by mouth every 6 (six) hours as needed for nausea or vomiting. 12/25/45   Delora Fuel, MD    Allergies    Cephalexin, Hydrocodone-guaifenesin, Tape, Tramadol, and Tramadol hcl  Review of Systems   Review of Systems Ten systems are reviewed and are negative for acute change except as noted in the HPI  Physical Exam Updated Vital Signs BP (!) 151/68   Pulse 85   Temp 98.2 F (36.8 C) (Oral)   Resp 15   Ht 5'  1" (1.549 m)   Wt 56.7 kg   SpO2 99%   BMI 23.62 kg/m   Physical Exam Constitutional:      General: She is not in acute distress.    Appearance: Normal appearance. She is well-developed. She is not ill-appearing or diaphoretic.  HENT:     Head: Normocephalic and atraumatic.  Eyes:     General: Vision grossly intact. Gaze aligned appropriately.     Pupils: Pupils are equal, round, and reactive to light.  Neck:     Trachea: Trachea and phonation normal.  Pulmonary:     Effort: Pulmonary effort is normal. No respiratory distress.  Abdominal:     General: There is no distension.     Palpations: Abdomen is soft.     Tenderness: There is no abdominal tenderness. There is no guarding or rebound.  Musculoskeletal:        General: Normal range of motion.     Cervical back: Normal range of motion.  Skin:    General: Skin is warm and dry.  Neurological:     Mental Status: She is alert.     GCS: GCS eye subscore is 4. GCS verbal subscore is 5. GCS motor subscore is 6.     Comments: Speech is clear and goal oriented, follows commands Major Cranial nerves without deficit, no facial droop Moves extremities without ataxia, coordination intact  Psychiatric:        Behavior: Behavior normal.     ED Results / Procedures / Treatments   Labs (all labs ordered are listed, but only abnormal results are displayed) Labs Reviewed  CBC WITH DIFFERENTIAL/PLATELET - Abnormal; Notable for the following components:      Result Value   WBC 2.0 (*)    Hemoglobin 11.9 (*)    RDW 15.8 (*)    Neutro Abs 1.2 (*)    Lymphs Abs 0.5 (*)    All other components within normal limits  COMPREHENSIVE METABOLIC PANEL - Abnormal; Notable for the following components:   Glucose, Bld 144 (*)    BUN 43 (*)    Creatinine, Ser 1.02 (*)    Albumin 3.4 (*)    AST 85 (*)    ALT 201 (*)    Alkaline Phosphatase 239 (*)    GFR, Estimated 55 (*)    All other components within normal limits  CULTURE, BLOOD (ROUTINE  X 2)  CULTURE, BLOOD (ROUTINE X 2)  LIPASE, BLOOD  LACTIC ACID, PLASMA  URINALYSIS, ROUTINE W REFLEX MICROSCOPIC  LACTIC ACID, PLASMA    EKG None  Radiology CT ABDOMEN PELVIS W CONTRAST  Result Date: 09/08/2020 CLINICAL DATA:  Right upper quadrant swelling, intermittent pain, bearing cancer with malignant ascites EXAM: CT ABDOMEN AND PELVIS WITH CONTRAST TECHNIQUE: Multidetector CT imaging of the abdomen and pelvis was performed using the standard protocol following bolus administration of intravenous contrast. CONTRAST:  131mL OMNIPAQUE IOHEXOL 300 MG/ML  SOLN COMPARISON:  07/22/2020 FINDINGS: Lower chest: No acute pleural or parenchymal lung disease. Calcified paraesophageal nodules measuring up to 6 mm unchanged, consistent with known metastatic ovarian cancer. Hepatobiliary: Stable indeterminate subcentimeter liver hypodensities. No intrahepatic biliary duct dilation. Gallbladder surgically absent. Pancreas: Unremarkable. No pancreatic ductal dilatation or surrounding inflammatory changes. Spleen: Stable indeterminate hypodensity posterior subcapsular region of the spleen, likely a small hemangioma. Adrenals/Urinary Tract: Stable left renal cyst. The kidneys enhance normally and symmetrically without obstructive uropathy. No urinary tract calculi. The adrenals are unremarkable. The bladder is decompressed, limiting its evaluation. Stomach/Bowel: Fluid-filled mildly dilated loops of jejunum within the left mid abdomen measure up to 2.4 cm in size, nonspecific. There are some scattered gas fluid levels consistent with regional ileus. Distal small bowel is relatively decompressed. There is gas and stool throughout the colon to the level of the rectum. Vascular/Lymphatic: Mild atherosclerosis unchanged. Multiple calcified retroperitoneal nodules consistent with metastatic disease. No new adenopathy. Reproductive: Enlarged calcified mass arising from the right adnexa measuring approximately 14 x 15 cm,  not significantly changed since prior study.  Calcified peritoneal masses within the central mesentery are again noted, without significant change since prior exam. Largest calcified mass within the central abdomen image 40/2 measures 3.4 x 2.4 cm, previous having measured approximately 3.7 x 2.1 cm. Findings are consistent with known history of metastatic ovarian cancer. Other: There is moderate ascites throughout the abdomen and pelvis, decreased since previous CT scan. No free intraperitoneal gas. No abdominal wall hernia. Musculoskeletal: Unremarkable right hip arthroplasty. No acute or destructive bony lesions. Stable intramuscular lipoma left lateral abdominal wall. Reconstructed images demonstrate no additional findings. IMPRESSION: 1. Large calcified pelvic mass arising from the right adnexa, consistent with known history of ovarian cancer. 2. Stable calcified metastatic implants throughout the mesentery. 3. Stable calcified nodules within the retroperitoneum, epiphrenic region, and paraesophageal regions, consistent with metastatic disease. 4. Moderate ascites. 5. Likely ileus involving the proximal to mid jejunum. No evidence of high-grade obstruction. Electronically Signed   By: Randa Ngo M.D.   On: 09/08/2020 03:59   DG Abdomen Acute W/Chest  Result Date: 09/09/2020 CLINICAL DATA:  Emesis EXAM: DG ABDOMEN ACUTE WITH 1 VIEW CHEST COMPARISON:  None. FINDINGS: Suboptimal visualization due to body habitus. There are no significantly dilated loops identified. No free air. No air-fluid levels. Cholecystectomy clips. No consolidation or edema. No pleural effusion. Heart size is normal for technique. Right shoulder arthroplasty. Advanced left glenohumeral osteoarthritis. Right hip arthroplasty. IMPRESSION: Nonobstructive bowel gas pattern. Electronically Signed   By: Macy Mis M.D.   On: 09/09/2020 13:11    Procedures Procedures   Medications Ordered in ED Medications  ondansetron (ZOFRAN) 8  mg in sodium chloride 0.9 % 50 mL IVPB (has no administration in time range)  prochlorperazine (COMPAZINE) injection 10 mg (has no administration in time range)  HYDROmorphone (DILAUDID) injection 1 mg (has no administration in time range)  ondansetron (ZOFRAN) injection 4 mg (4 mg Intravenous Given 09/09/20 1229)  sodium chloride 0.9 % bolus 1,000 mL (0 mLs Intravenous Stopped 09/09/20 1417)  iohexol (OMNIPAQUE) 9 MG/ML oral solution 1,000 mL (1,000 mLs Oral Contrast Given 09/09/20 1339)    ED Course  I have reviewed the triage vital signs and the nursing notes.  Pertinent labs & imaging results that were available during my care of the patient were reviewed by me and considered in my medical decision making (see chart for details).  Clinical Course as of 09/09/20 1447  Mon Sep 09, 2020  1150 Dr. Alvy Bimler [BM]    Clinical Course User Index [BM] Gari Crown   MDM Rules/Calculators/A&P                         Additional history obtained from: 1. Nursing notes from this visit. 2. Review of electronic medical records. --------------- I ordered, reviewed and interpreted labs which include: Lactic acid 1.2, reassuring. Lipase within normal limits, doubt pancreatitis. CMP shows mild AKI as well as new LFT elevations.  No emergent electrolyte derangements or gap. CBC shows leukopenia 2.0, mild anemia 11.9, no thrombocytopenia.  DG Abd/Chest:  IMPRESSION:  Nonobstructive bowel gas pattern.   Consult with oncologist Dr. Alvy Bimler.  Advises patients LFT elevations may be secondary to chemotherapy 2 weeks ago, normally they see elevations within the first week after chemo.  Given AKI Dr. Alvy Bimler has asked that we obtain the x-ray of the abdomen to assess for possible SBO.  Dr. Alvy Bimler asked that if this was unremarkable to obtain CT abdomen pelvis with oral contrast to evaluate for  SBO.  Dr. Alvy Bimler advises she will be by to evaluate patient today. ------------------------- Patient  was seen and evaluated by oncologist Dr. Alvy Bimler.  X-ray of the abdomen showed nonobstructive bowel gas pattern.  Attempted to obtain CT abdomen pelvis with oral contrast for further evaluation however patient was unable to tolerate the contrast.  I called Dr. Alvy Bimler and discussed the situation she advises medicine admission and follow-up x-ray tomorrow, she is going to run the patient again tomorrow as well, advised not to obtain CT abdomen pelvis at this time.  I placed consult for hospitalist service. - Patient reassessed she is resting comfortably no acute distress she states understanding of care plan she is agreeable for admission. - Consult with Dr. Neysa Bonito, patient accepted to medicine service.  Note: Portions of this report may have been transcribed using voice recognition software. Every effort was made to ensure accuracy; however, inadvertent computerized transcription errors may still be present. Final Clinical Impression(s) / ED Diagnoses Final diagnoses:  Intractable vomiting with nausea, unspecified vomiting type    Rx / DC Orders ED Discharge Orders    None       Gari Crown 09/09/20 1519    Milton Ferguson, MD 09/10/20 [id number]

## 2020-09-09 NOTE — H&P (Signed)
History and Physical        Hospital Admission Note Date: 09/09/2020  Patient name: [name] Medical record number: [id number] Date of birth: [birth year] Age: 80 y.o. Gender: female  PCP: [name], MD  Patient coming from: Home Lives with: Husband At baseline, ambulates: Independently  Chief Complaint    Chief Complaint  Patient presents with  . Emesis      HPI:   This is an 80 year old female with past medical history of ovarian cancer on chemo, malignant ascites s/p paracentesis 3/8, polymyalgia rheumatica, atrial fibrillation not on anticoagulation, cholecystectomy, appendectomy, hyperlipidemia who presented to the ED with nausea, vomiting, poor p.o. intake and generalized abdominal pain for 2 days.  She was seen in the ED 2 days ago for this at which point she was discharged with prochlorperazine and oxycodone and recommended to follow-up with her oncologist, Dr. Alvy Bimler.  Has had continued nausea despite antiemetics.  Denies fever, chills, chest pain, shortness of breath or any other complaints.  She was seen by Dr. Alvy Bimler in the ED who had concerns for early SBO and recommended hospitalist admission.   ED Course: Afebrile, hypertensive, on room air. Notable Labs: Sodium 139, K3.8, glucose 144, BUN 43, creatinine 1.02, alk phos 239, lipase 27, AST 85, ALT 201, T bili 0.9, lactic acid 1.2, WBC 2.0, Hb 11.9, COVID-19 pending. Notable Imaging: CT abdomen pelvis with IV contrast without oral contrast (09/08/2020)-suspect ileus involving proximal mid jejunum with no evidence of high-grade obstruction, moderate ascites and other stable findings.  Chest and abdomen XR-nonobstructive bowel gas pattern.  Patient received Zofran, 1 L NS bolus, Compazine.    Vitals:   09/09/20 1600 09/09/20 1630  BP: (!) 147/69 (!) 157/80  Pulse: 77 100  Resp: 19 20  Temp:    SpO2:  95% 96%     Review of Systems:  Review of Systems  Gastrointestinal: Positive for constipation.       No flatus  All other systems reviewed and are negative.   Medical/Social/Family History   Past Medical History: Past Medical History:  Diagnosis Date  . Arthritis    right shoulder replacement, right replacement   . Atrial fibrillation ([place])   . Cancer ([place])   . Chronic pancreatitis ([place])   . Constipation   . Osteopenia   . Pancreatitis 1990s  . Syncope 03/2011    Past Surgical History:  Procedure Laterality Date  . APPENDECTOMY  1955  . CHOLECYSTECTOMY  1998  . CYSTECTOMY  1981   [place]  . JOINT REPLACEMENT     right shoulder 2008; right hip 2016  . [place]  . TOTAL HIP ARTHROPLASTY Right 11/2014   [place], [place]    Medications: Prior to Admission medications   Medication Sig Start Date End Date Taking? Authorizing Provider  acetaminophen (TYLENOL) 500 MG chewable tablet Chew 500 mg by mouth. Takes up to 6 a day.    [provider]  CALCIUM PO Take 1,000 mg by mouth daily as needed.    [provider]  Cholecalciferol (VITAMIN D3 PO) Take 2,000 Units by mouth daily.     [provider]  Coenzyme Q10 (CO Q  10 PO) Take 300 mg by mouth daily.     [provider]  dexamethasone (DECADRON) 4 MG tablet Take 1 tablet (4 mg total) by mouth 2 (two) times daily. 09/05/20   [name], MD  magnesium oxide (MAG-OX) 400 MG tablet Take by mouth daily.     [provider]  Melatonin 1 MG CAPS Take 5 mg by mouth at bedtime as needed.     [provider]  Multiple Vitamin (MULTI-VITAMINS) TABS Take 1 tablet by mouth daily.    [provider]  ondansetron (ZOFRAN) 8 MG tablet Take 1 tablet (8 mg total) by mouth every 8 (eight) hours as needed for refractory nausea / vomiting. Start on day 3 after carboplatin chemo. 07/30/20   [name], MD  oxyCODONE (ROXICODONE) 5 MG immediate  release tablet Take 1 tablet (5 mg total) by mouth every 4 (four) hours as needed for severe pain. 11/06/23   Delora Fuel, MD  oxyCODONE (ROXICODONE) 5 MG immediate release tablet Take 1 tablet (5 mg total) by mouth every 4 (four) hours as needed for severe pain. 8/52/77   Delora Fuel, MD  pravastatin (PRAVACHOL) 20 MG tablet Take 1 tablet (20 mg total) by mouth daily. 04/19/20   [name], MD  prochlorperazine (COMPAZINE) 10 MG tablet Take 1 tablet (10 mg total) by mouth every 6 (six) hours as needed (Nausea or vomiting). 02/19/22   Delora Fuel, MD  prochlorperazine (COMPAZINE) 10 MG tablet Take 1 tablet (10 mg total) by mouth every 6 (six) hours as needed for nausea or vomiting. 5/36/14   Delora Fuel, MD    Allergies:   Allergies  Allergen Reactions  . Cephalexin Other (See Comments)    dehydrated  . Hydrocodone-Guaifenesin     Dizzy  Other reaction(s): dizzy  . Tape Other (See Comments)    Surgical tape Other reaction(s): Unknown  . Tramadol Hcl     Other reaction(s): Constipation    Social History:  reports that she quit smoking about 42 years ago. Her smoking use included cigarettes. She smoked 2.50 packs per day. She has never used smokeless tobacco. She reports previous alcohol use. She reports that she does not use drugs.  Family History: Family History  Problem Relation Age of Onset  . Dementia Mother   . Memory loss Brother   . Hodgkin's lymphoma Father   . Kidney failure Brother   . Cancer Paternal Grandfather        unknown  . Breast cancer Maternal Aunt      Objective   Physical Exam: Blood pressure (!) 157/80, pulse 100, temperature 98.2 F (36.8 C), temperature source Oral, resp. rate 20, height 5' 1" (1.549 m), weight 56.7 kg, SpO2 96 %.  Physical Exam Vitals and nursing note reviewed.  Constitutional:      General: She is not in acute distress. HENT:     Head: Normocephalic.     Mouth/Throat:     Mouth: Mucous membranes are dry.  Eyes:      Conjunctiva/sclera: Conjunctivae normal.  Cardiovascular:     Rate and Rhythm: Normal rate and regular rhythm.  Pulmonary:     Effort: Pulmonary effort is normal.     Breath sounds: Normal breath sounds.  Abdominal:     Comments: Mildly distended abdomen, soft and nontender with decreased to absent bowel sounds  Musculoskeletal:        General: No swelling or tenderness.  Skin:    General: Skin is warm.  Coloration: Skin is not jaundiced.  Neurological:     Mental Status: She is alert. Mental status is at baseline.  Psychiatric:        Mood and Affect: Mood normal.        Behavior: Behavior normal.     LABS on Admission: I have personally reviewed all the labs and imaging below    Basic Metabolic Panel: Recent Labs  Lab 09/07/20 2310 09/09/20 1052  NA 137 139  K 4.1 3.8  CL 101 99  CO2 24 27  GLUCOSE 110* 144*  BUN 36* 43*  CREATININE 0.83 1.02*  CALCIUM 8.8* 8.9   Liver Function Tests: Recent Labs  Lab 09/07/20 2310 09/09/20 1052  AST 19 85*  ALT 22 201*  ALKPHOS 70 239*  BILITOT 0.4 0.9  PROT 6.3* 6.6  ALBUMIN 3.3* 3.4*   Recent Labs  Lab 09/07/20 2310 09/09/20 1052  LIPASE 56* 27   No results for input(s): AMMONIA in the last 168 hours. CBC: Recent Labs  Lab 09/07/20 2318 09/09/20 1052  WBC 3.5* 2.0*  NEUTROABS 1.8 1.2*  HGB 11.1* 11.9*  HCT 34.3* 36.2  MCV 87.9 85.4  PLT 277 268   Cardiac Enzymes: No results for input(s): CKTOTAL, CKMB, CKMBINDEX, TROPONINI in the last 168 hours. BNP: Invalid input(s): POCBNP CBG: No results for input(s): GLUCAP in the last 168 hours.  Radiological Exams on Admission:  CT ABDOMEN PELVIS W CONTRAST  Result Date: 09/08/2020 CLINICAL DATA:  Right upper quadrant swelling, intermittent pain, bearing cancer with malignant ascites EXAM: CT ABDOMEN AND PELVIS WITH CONTRAST TECHNIQUE: Multidetector CT imaging of the abdomen and pelvis was performed using the standard protocol following bolus administration  of intravenous contrast. CONTRAST:  18m OMNIPAQUE IOHEXOL 300 MG/ML  SOLN COMPARISON:  07/22/2020 FINDINGS: Lower chest: No acute pleural or parenchymal lung disease. Calcified paraesophageal nodules measuring up to 6 mm unchanged, consistent with known metastatic ovarian cancer. Hepatobiliary: Stable indeterminate subcentimeter liver hypodensities. No intrahepatic biliary duct dilation. Gallbladder surgically absent. Pancreas: Unremarkable. No pancreatic ductal dilatation or surrounding inflammatory changes. Spleen: Stable indeterminate hypodensity posterior subcapsular region of the spleen, likely a small hemangioma. Adrenals/Urinary Tract: Stable left renal cyst. The kidneys enhance normally and symmetrically without obstructive uropathy. No urinary tract calculi. The adrenals are unremarkable. The bladder is decompressed, limiting its evaluation. Stomach/Bowel: Fluid-filled mildly dilated loops of jejunum within the left mid abdomen measure up to 2.4 cm in size, nonspecific. There are some scattered gas fluid levels consistent with regional ileus. Distal small bowel is relatively decompressed. There is gas and stool throughout the colon to the level of the rectum. Vascular/Lymphatic: Mild atherosclerosis unchanged. Multiple calcified retroperitoneal nodules consistent with metastatic disease. No new adenopathy. Reproductive: Enlarged calcified mass arising from the right adnexa measuring approximately 14 x 15 cm, not significantly changed since prior study. Calcified peritoneal masses within the central mesentery are again noted, without significant change since prior exam. Largest calcified mass within the central abdomen image 40/2 measures 3.4 x 2.4 cm, previous having measured approximately 3.7 x 2.1 cm. Findings are consistent with known history of metastatic ovarian cancer. Other: There is moderate ascites throughout the abdomen and pelvis, decreased since previous CT scan. No free intraperitoneal gas.  No abdominal wall hernia. Musculoskeletal: Unremarkable right hip arthroplasty. No acute or destructive bony lesions. Stable intramuscular lipoma left lateral abdominal wall. Reconstructed images demonstrate no additional findings. IMPRESSION: 1. Large calcified pelvic mass arising from the right adnexa, consistent with known history of ovarian cancer.  2. Stable calcified metastatic implants throughout the mesentery. 3. Stable calcified nodules within the retroperitoneum, epiphrenic region, and paraesophageal regions, consistent with metastatic disease. 4. Moderate ascites. 5. Likely ileus involving the proximal to mid jejunum. No evidence of high-grade obstruction. Electronically Signed   By: Randa Ngo M.D.   On: 09/08/2020 03:59   DG Abdomen Acute W/Chest  Result Date: 09/09/2020 CLINICAL DATA:  Emesis EXAM: DG ABDOMEN ACUTE WITH 1 VIEW CHEST COMPARISON:  None. FINDINGS: Suboptimal visualization due to body habitus. There are no significantly dilated loops identified. No free air. No air-fluid levels. Cholecystectomy clips. No consolidation or edema. No pleural effusion. Heart size is normal for technique. Right shoulder arthroplasty. Advanced left glenohumeral osteoarthritis. Right hip arthroplasty. IMPRESSION: Nonobstructive bowel gas pattern. Electronically Signed   By: Macy Mis M.D.   On: 09/09/2020 13:11      EKG: normal sinus rhythm, left axis deviation   A & P   Active Problems:   Ovarian ca (HCC)   Intractable nausea and vomiting   Elevated LFTs   1. Intractable nausea and vomiting a. CT abdomen pelvis with IV contrast, wo oral contrast on 3/13 showed suspected ileus without high-grade obstruction b. Abdominal XR today unremarkable c. Keep n.p.o.  d. IV hydration until able to tolerate p.o. intake e. Pain management f. Consider repeat imaging if no improvement in the next day or so  2. Ovarian cancer a. Oncology on board  3. Malignant ascites a. S/p paracentesis  on 3/8 b. May need repeat paracentesis while inpatient  4. Elevated LFTs a. LFTs were normal on 3/12 and today elevated: Alk phos 239, AST 85, ALT 201 b. She denies abdominal pain currently and has history of cholecystectomy c. Follow-up in a.m.  5. Mild pancytopenia due to recent treatment a. Hematology/oncology on board  6. Atrial fibrillation, Currently sinus a. Not on anticoagulation   7. Generalized weakness a. PT eval  DVT prophylaxis: Lovenox   Code Status: Full Code  Diet: N.p.o. Family Communication: Admission, patients condition and plan of care including tests being ordered have been discussed with the patient who indicates understanding and agrees with the plan and Code Status. Patient's husband was updated  Disposition Plan: The appropriate patient status for this patient is INPATIENT. Inpatient status is judged to be reasonable and necessary in order to provide the required intensity of service to ensure the patient's safety. The patient's presenting symptoms, physical exam findings, and initial radiographic and laboratory data in the context of their chronic comorbidities is felt to place them at high risk for further clinical deterioration. Furthermore, it is not anticipated that the patient will be medically stable for discharge from the hospital within 2 midnights of admission. The following factors support the patient status of inpatient.   " The patient's presenting symptoms include nausea and vomiting, poor p.o. intake. " The worrisome physical exam findings include decreased bowel sounds. " The initial radiographic and laboratory data are worrisome because of elevated LFTs. " The chronic co-morbidities include ovarian cancer.   * I certify that at the point of admission it is my clinical judgment that the patient will require inpatient hospital care spanning beyond 2 midnights from the point of admission due to high intensity of service, high risk for further  deterioration and high frequency of surveillance required.*   Status is: Inpatient  Remains inpatient appropriate because:IV treatments appropriate due to intensity of illness or inability to take PO and Inpatient level of care appropriate due to severity  of illness   Dispo: The patient is from: Home              Anticipated d/c is to: Home              Patient currently is not medically stable to d/c.   Difficult to place patient No   Consultants  . Oncology  Procedures  . None  Time Spent on Admission: 60 minutes    Harold Hedge, DO [facility]  09/09/2020, 5:23 PM

## 2020-09-09 NOTE — Telephone Encounter (Signed)
Erline Levine Darraugh called and left a message. She has moved to the area now and Phone # [phone]  I spoke with her over the telephone and updated her of the patient's clinical condition.  I addressed all her questions

## 2020-09-09 NOTE — ED Notes (Signed)
Pt returned from XRay at this time.

## 2020-09-09 NOTE — Progress Notes (Signed)
[name]   DOB:August 07, 1940   ZO#:[id number]    ASSESSMENT & PLAN:  Ovarian cancer I have reviewed her recent CT imaging this past weekend Unfortunately, it was done without oral contrast Overall, there are not much changes to measurable disease Continue supportive care for now  Abdominal pain, mild dilated loops of bowel I am concerned that she might have early signs of small bowel obstruction She has intermittent abdominal pain, severe nausea and vomiting, changes in bowel habits and bloating I recommend IV fluid support, IV pain medicine as needed as well as IV antiemetics Recommend plain x-ray of the abdomen today If her symptoms do not improve, we might have to repeat CT imaging with oral contrast  Malignant ascites She is not profoundly symptomatic right now from malignant ascites She may need repeat paracentesis while hospitalized  Elevated LFTs This is highly unusual 2 weeks out from recent chemotherapy Observe closely for now  Mild pancytopenia Due to recent treatment She does not need G-CSF support  Goals of care Resolution of her bowel symptoms and able to eat without nausea and vomiting  Discharge planning She will likely be here for 2 to 3 days I will return to check on her tomorrow  All questions were answered. The patient knows to call the clinic with any problems, questions or concerns.   [name], MD 09/09/2020 12:16 PM  Subjective:  The patient is well-known to me.  I was told she went to the emergency department over the weekend for abdominal pain.  Her abdominal pain has improved but she has uncontrolled nausea and vomiting along with bloating and distention.  Her last bowel movement was on Sunday Her husband provided history by the bedside.  She is not able to eat and drink almost the entire weekend  Objective:  Vitals:   09/09/20 1115 09/09/20 1130  BP: (!) 143/67 139/68  Pulse: 78 79  Resp: 12 17  Temp:    SpO2: 95% 96%    No intake or  output data in the 24 hours ending 09/09/20 1216  GENERAL:alert, no distress and comfortable.  She looks pale SKIN: skin color, texture, turgor are normal, no rashes or significant lesions EYES: normal, Conjunctiva are pink and non-injected, sclera clear OROPHARYNX:no exudate, no erythema and lips, buccal mucosa, and tongue normal  NECK: supple, thyroid normal size, non-tender, without nodularity LYMPH:  no palpable lymphadenopathy in the cervical, axillary or inguinal LUNGS: clear to auscultation and percussion with normal breathing effort HEART: regular rate & rhythm with soft ejection systolic murmur and moderate bilateral lower extremity edema ABDOMEN:abdomen soft, distended.  Active bowel sounds on the epigastric region but less bowel sounds on the left lower quadrant.  Ascites present Musculoskeletal:no cyanosis of digits and no clubbing  NEURO: alert & oriented x 3 with fluent speech, no focal motor/sensory deficits   Labs:  Recent Labs    08/26/20 1015 09/07/20 2310 09/09/20 1052  NA 136 137 139  K 4.2 4.1 3.8  CL 106 101 99  CO2 23 24 27   GLUCOSE 98 110* 144*  BUN 33* 36* 43*  CREATININE 0.82 0.83 1.02*  CALCIUM 9.0 8.8* 8.9  GFRNONAA >60 >60 55*  PROT 6.4* 6.3* 6.6  ALBUMIN 3.1* 3.3* 3.4*  AST 15 19 85*  ALT 13 22 201*  ALKPHOS 86 70 239*  BILITOT 0.3 0.4 0.9    Studies: I have personally reviewed CT imaging CT ABDOMEN PELVIS W CONTRAST  Result Date: 09/08/2020 CLINICAL DATA:  Right  upper quadrant swelling, intermittent pain, bearing cancer with malignant ascites EXAM: CT ABDOMEN AND PELVIS WITH CONTRAST TECHNIQUE: Multidetector CT imaging of the abdomen and pelvis was performed using the standard protocol following bolus administration of intravenous contrast. CONTRAST:  191mL OMNIPAQUE IOHEXOL 300 MG/ML  SOLN COMPARISON:  07/22/2020 FINDINGS: Lower chest: No acute pleural or parenchymal lung disease. Calcified paraesophageal nodules measuring up to 6 mm unchanged,  consistent with known metastatic ovarian cancer. Hepatobiliary: Stable indeterminate subcentimeter liver hypodensities. No intrahepatic biliary duct dilation. Gallbladder surgically absent. Pancreas: Unremarkable. No pancreatic ductal dilatation or surrounding inflammatory changes. Spleen: Stable indeterminate hypodensity posterior subcapsular region of the spleen, likely a small hemangioma. Adrenals/Urinary Tract: Stable left renal cyst. The kidneys enhance normally and symmetrically without obstructive uropathy. No urinary tract calculi. The adrenals are unremarkable. The bladder is decompressed, limiting its evaluation. Stomach/Bowel: Fluid-filled mildly dilated loops of jejunum within the left mid abdomen measure up to 2.4 cm in size, nonspecific. There are some scattered gas fluid levels consistent with regional ileus. Distal small bowel is relatively decompressed. There is gas and stool throughout the colon to the level of the rectum. Vascular/Lymphatic: Mild atherosclerosis unchanged. Multiple calcified retroperitoneal nodules consistent with metastatic disease. No new adenopathy. Reproductive: Enlarged calcified mass arising from the right adnexa measuring approximately 14 x 15 cm, not significantly changed since prior study. Calcified peritoneal masses within the central mesentery are again noted, without significant change since prior exam. Largest calcified mass within the central abdomen image 40/2 measures 3.4 x 2.4 cm, previous having measured approximately 3.7 x 2.1 cm. Findings are consistent with known history of metastatic ovarian cancer. Other: There is moderate ascites throughout the abdomen and pelvis, decreased since previous CT scan. No free intraperitoneal gas. No abdominal wall hernia. Musculoskeletal: Unremarkable right hip arthroplasty. No acute or destructive bony lesions. Stable intramuscular lipoma left lateral abdominal wall. Reconstructed images demonstrate no additional findings.  IMPRESSION: 1. Large calcified pelvic mass arising from the right adnexa, consistent with known history of ovarian cancer. 2. Stable calcified metastatic implants throughout the mesentery. 3. Stable calcified nodules within the retroperitoneum, epiphrenic region, and paraesophageal regions, consistent with metastatic disease. 4. Moderate ascites. 5. Likely ileus involving the proximal to mid jejunum. No evidence of high-grade obstruction. Electronically Signed   By: Randa Ngo M.D.   On: 09/08/2020 03:59   US Paracentesis  Result Date: 09/03/2020 INDICATION: Patient with history of ovarian cancer with recurrent malignant ascites. Request received for therapeutic paracentesis up to 5 liters. EXAM: ULTRASOUND GUIDED THERAPEUTIC PARACENTESIS MEDICATIONS: 1% lidocaine to skin and subcutaneous tissue COMPLICATIONS: None immediate. PROCEDURE: Informed written consent was obtained from the patient after a discussion of the risks, benefits and alternatives to treatment. A timeout was performed prior to the initiation of the procedure. Initial ultrasound scanning demonstrates a large amount of ascites within the left lower abdominal quadrant. The left lower abdomen was prepped and draped in the usual sterile fashion. 1% lidocaine was used for local anesthesia. Following this, a 19 gauge, 10-cm, Yueh catheter was introduced. An ultrasound image was saved for documentation purposes. The paracentesis was performed. The catheter was removed and a dressing was applied. The patient tolerated the procedure well without immediate post procedural complication. FINDINGS: A total of approximately 5 liters of yellow fluid was removed. IMPRESSION: Successful ultrasound-guided therapeutic paracentesis yielding 5 liters of peritoneal fluid. Read by: Rowe Robert, PA-C Electronically Signed   By: Jacqulynn Cadet M.D.   On: 09/03/2020 15:28

## 2020-09-09 NOTE — ED Triage Notes (Signed)
Patient c/o emesis and states she did have abdominal pain, but not now. Patient states she is currently receiving chemo 1 1/2 weeks ago. Patient states she was seen yesterday for the same.

## 2020-09-09 NOTE — Telephone Encounter (Signed)
Husband called and left a message to call him.  Called back. [name] went to ER Saturday for right chest/breast pain. She she got home from ER she started having diarrhea/ vomiting. Emesis is a rusty brown color. She slept al day Sunday. She did not eat anything Sunday or today, and feels like she cannot eat. She has been taking her Compazine for nausea.  Review above with Dr. Alvy Bimler. Instructed husband to take her to ER now. He verbalized understanding.

## 2020-09-10 ENCOUNTER — Ambulatory Visit: Payer: Medicare Other | Admitting: Hematology and Oncology

## 2020-09-10 ENCOUNTER — Inpatient Hospital Stay (HOSPITAL_COMMUNITY): Payer: Medicare Other

## 2020-09-10 ENCOUNTER — Other Ambulatory Visit (HOSPITAL_COMMUNITY): Payer: Medicare Other

## 2020-09-10 ENCOUNTER — Telehealth: Payer: Self-pay | Admitting: Oncology

## 2020-09-10 DIAGNOSIS — R180 Malignant ascites: Secondary | ICD-10-CM | POA: Diagnosis not present

## 2020-09-10 DIAGNOSIS — C569 Malignant neoplasm of unspecified ovary: Secondary | ICD-10-CM | POA: Diagnosis not present

## 2020-09-10 DIAGNOSIS — R109 Unspecified abdominal pain: Secondary | ICD-10-CM | POA: Diagnosis not present

## 2020-09-10 DIAGNOSIS — D61810 Antineoplastic chemotherapy induced pancytopenia: Secondary | ICD-10-CM | POA: Diagnosis not present

## 2020-09-10 LAB — COMPREHENSIVE METABOLIC PANEL
ALT: 124 U/L — ABNORMAL HIGH (ref 0–44)
AST: 42 U/L — ABNORMAL HIGH (ref 15–41)
Albumin: 2.9 g/dL — ABNORMAL LOW (ref 3.5–5.0)
Alkaline Phosphatase: 164 U/L — ABNORMAL HIGH (ref 38–126)
Anion gap: 13 (ref 5–15)
BUN: 38 mg/dL — ABNORMAL HIGH (ref 8–23)
CO2: 25 mmol/L (ref 22–32)
Calcium: 8.3 mg/dL — ABNORMAL LOW (ref 8.9–10.3)
Chloride: 99 mmol/L (ref 98–111)
Creatinine, Ser: 0.98 mg/dL (ref 0.44–1.00)
GFR, Estimated: 58 mL/min — ABNORMAL LOW (ref >60)
Glucose, Bld: 122 mg/dL — ABNORMAL HIGH (ref 70–99)
Potassium: 3.6 mmol/L (ref 3.5–5.1)
Sodium: 137 mmol/L (ref 135–145)
Total Bilirubin: 1.1 mg/dL (ref 0.3–1.2)
Total Protein: 5.4 g/dL — ABNORMAL LOW (ref 6.5–8.1)

## 2020-09-10 LAB — CBC
HCT: 31.8 % — ABNORMAL LOW (ref 36.0–46.0)
Hemoglobin: 10.5 g/dL — ABNORMAL LOW (ref 12.0–15.0)
MCH: 28.7 pg (ref 26.0–34.0)
MCHC: 33 g/dL (ref 30.0–36.0)
MCV: 86.9 fL (ref 80.0–100.0)
Platelets: 227 K/uL (ref 150–400)
RBC: 3.66 MIL/uL — ABNORMAL LOW (ref 3.87–5.11)
RDW: 15.3 % (ref 11.5–15.5)
WBC: 2.5 K/uL — ABNORMAL LOW (ref 4.0–10.5)
nRBC: 0 % (ref 0.0–0.2)

## 2020-09-10 MED ORDER — LIDOCAINE HCL 1 % IJ SOLN
INTRAMUSCULAR | Status: AC
  Filled 2020-09-10: qty 20

## 2020-09-10 MED ORDER — DEXAMETHASONE SODIUM PHOSPHATE 4 MG/ML IJ SOLN
4.0000 mg | Freq: Two times a day (BID) | INTRAMUSCULAR | Status: DC
  Administered 2020-09-10 – 2020-09-12 (×5): 4 mg via INTRAVENOUS
  Filled 2020-09-10 (×5): qty 1

## 2020-09-10 MED ORDER — MELATONIN 3 MG PO TABS
3.0000 mg | ORAL_TABLET | Freq: Every evening | ORAL | Status: DC | PRN
  Administered 2020-09-10 – 2020-09-13 (×5): 3 mg via ORAL
  Filled 2020-09-10 (×6): qty 1

## 2020-09-10 NOTE — Progress Notes (Signed)
PROGRESS NOTE    [name]  JFH:[id number] DOB: 01-18-1941 DOA: 09/09/2020 PCP: [name], MD   Chief Complaint  Patient presents with  . Emesis  Brief Narrative: 80 year old female with history of ovarian cancer on chemotherapy, malignant ascites status post paracentesis 3/8, PMR, A. fib not on anticoagulation, history of cholecystectomy appendicectomy, hyperlipidemia admitted with intractable nausea vomiting and abdominal pain x2 days.  She was seen 2 days prior to admission in the ED and was discharged on pain medication and nausea medication.  She was then seen by her oncologist Dr Alvy Bimler in the ED and there was concern for early SBO and recommended hospital admission. In the ED patient is afebrile hypertensive blood work showed alk phos 239 lipase 27 AST 85 ALT 201 total bili 0.9 lactic acid normal mild leukopenia 2.0 COVID-19 pending-CT abdomen pelvis with IV contrast without oral contrast (09/08/2020)-suspect ileus involving proximal mid jejunum with no evidence of high-grade obstruction, moderate ascites and other stable findings.  Chest and abdomen XR-nonobstructive bowel gas pattern.  Patient received Zofran, 1 L NS bolus, Compazine.   Patient was admitted Covid has come back negative since Patient was kept n.p.o. IV fluid hydration  Subjective: Seen and examined.  Patient reports he had loose stool and also vomited this morning and nauseous.  She was cleaned by the nursing tech and husband is at the bedside. Overnight no fever saturating on room air blood pressure on higher side  Assessment & Plan: Intractable nausea vomiting CT scan 3/13 suspect ileus without high-grade obstruction X-ray on admission unremarkable and being managed with n.p.o. IV hydration pain control. need serial radiological and clinical exam-had a bowel movement since admission.  Plan for paracentesis today  Ovarian cancer on chemotherapy Malignant ascites recent paracentesis 3/8 Oncology  following.  Planning for paracentesis today.  Abnormal LFTs History of cholecystectomy Left is slightly better.  Monitor. Recent Labs  Lab 09/07/20 2310 09/09/20 1052 09/10/20 0536  AST 19 85* 42*  ALT 22 201* 124*  ALKPHOS 70 239* 164*  BILITOT 0.4 0.9 1.1  PROT 6.3* 6.6 5.4*  ALBUMIN 3.3* 3.4* 2.9*   Mild pancytopenia with leukopenia likely from chemotherapy: Oncology on board Recent Labs  Lab 09/07/20 2318 09/09/20 1052 09/10/20 0536  HGB 11.1* 11.9* 10.5*  HCT 34.3* 36.2 31.8*  WBC 3.5* 2.0* 2.5*  PLT 277 268 227   A. fib currently in sinus not on anticoagulation at baseline  Generalized deconditioning/weakness PT requested  Failure to thrive deconditioning, resume Decadron per patient and husband request.  Husband reports it helps her appetite.  Goals of care full code, appreciate oncology follow-up on board.  Diet Order            Diet NPO time specified Except for: Sips with Meds  Diet effective now               Patient's Body mass index is 23.62 kg/m. DVT prophylaxis: enoxaparin (LOVENOX) injection 40 mg Start: 09/09/20 2200 Code Status:   Code Status: Full Code  Family Communication: plan of care discussed with patient at bedside.  Status is: Inpatient Remains inpatient appropriate because:Ongoing diagnostic testing needed not appropriate for outpatient work up, Unsafe d/c plan and Inpatient level of care appropriate due to severity of illness  Dispo: The patient is from: Home              Anticipated d/c is to: Home              Patient  currently is not medically stable to d/c.   Difficult to place patient No   Unresulted Labs (From admission, onward)          Start     Ordered   09/10/20 0500  CBC  Daily,   R      09/09/20 1707   09/10/20 0500  Comprehensive metabolic panel  Daily,   R      09/09/20 1707   09/09/20 1143  Blood culture (routine x 2)  BLOOD CULTURE X 2,   STAT      09/09/20 1142        Medications reviewed:  Scheduled  Meds: . dexamethasone (DECADRON) injection  4 mg Intravenous Q12H  . enoxaparin (LOVENOX) injection  40 mg Subcutaneous Q24H  . lidocaine      . sodium chloride flush  3 mL Intravenous Q12H   Continuous Infusions: . lactated ringers 75 mL/hr at 09/10/20 0545  . ondansetron [facility]) IV 8 mg (09/10/20 0340)    Consultants:see note  Procedures:see note  Antimicrobials: Anti-infectives (From admission, onward)   None     Culture/Microbiology No results found for: SDES, SPECREQUEST, CULT, REPTSTATUS  Other culture-see note  Objective: Vitals: Today's Vitals   09/09/20 2014 09/09/20 2058 09/10/20 0150 09/10/20 0610  BP:   (!) 159/68 (!) 151/85  Pulse:   97 86  Resp:   16 16  Temp:   97.9 F (36.6 C) 98.5 F (36.9 C)  TempSrc:   Oral Oral  SpO2:   96% 97%  Weight:      Height:      PainSc: 3  Asleep      Intake/Output Summary (Last 24 hours) at 09/10/2020 1057 Last data filed at 09/10/2020 1165 Gross per 24 hour  Intake 605.09 ml  Output 200 ml  Net 405.09 ml   Filed Weights   09/09/20 1039  Weight: 56.7 kg   Weight change:   Intake/Output from previous day: 03/14 0701 - 03/15 0700 In: 605.1 [I.V.:586.3; IV Piggyback:18.8] Out: 200 [Urine:200] Intake/Output this shift: No intake/output data recorded. Filed Weights   09/09/20 1039  Weight: 56.7 kg    Examination: General exam: AAOx3, older than his stated age, ill and frail looking,NAD, weak appearing. HEENT:Oral mucosa moist, Ear/Nose WNL grossly,dentition normal. Respiratory system: bilaterally diminished,no use of accessory muscle, non tender. Cardiovascular system: S1 & S2 +, regular, No JVD. Gastrointestinal system: Abdomen soft, distended minimal tenderness, BS+.  Passing flatus. Nervous System:Alert, awake, moving extremities and grossly nonfocal Extremities: No edema, distal peripheral pulses palpable.  Skin: No rashes,no icterus. MSK: Normal muscle bulk,tone, power  Data Reviewed: I have  personally reviewed following labs and imaging studies CBC: Recent Labs  Lab 09/07/20 2318 09/09/20 1052 09/10/20 0536  WBC 3.5* 2.0* 2.5*  NEUTROABS 1.8 1.2*  --   HGB 11.1* 11.9* 10.5*  HCT 34.3* 36.2 31.8*  MCV 87.9 85.4 86.9  PLT 277 268 790   Basic Metabolic Panel: Recent Labs  Lab 09/07/20 2310 09/09/20 1052 09/10/20 0536  NA 137 139 137  K 4.1 3.8 3.6  CL 101 99 99  CO2 24 27 25   GLUCOSE 110* 144* 122*  BUN 36* 43* 38*  CREATININE 0.83 1.02* 0.98  CALCIUM 8.8* 8.9 8.3*   GFR: Estimated Creatinine Clearance: 34 mL/min (by C-G formula based on SCr of 0.98 mg/dL). Liver Function Tests: Recent Labs  Lab 09/07/20 2310 09/09/20 1052 09/10/20 0536  AST 19 85* 42*  ALT 22 201* 124*  ALKPHOS 70  239* 164*  BILITOT 0.4 0.9 1.1  PROT 6.3* 6.6 5.4*  ALBUMIN 3.3* 3.4* 2.9*   Recent Labs  Lab 09/07/20 2310 09/09/20 1052  LIPASE 56* 27   No results for input(s): AMMONIA in the last 168 hours. Coagulation Profile: No results for input(s): INR, PROTIME in the last 168 hours. Cardiac Enzymes: No results for input(s): CKTOTAL, CKMB, CKMBINDEX, TROPONINI in the last 168 hours. BNP (last 3 results) No results for input(s): PROBNP in the last 8760 hours. HbA1C: No results for input(s): HGBA1C in the last 72 hours. CBG: No results for input(s): GLUCAP in the last 168 hours. Lipid Profile: No results for input(s): CHOL, HDL, LDLCALC, TRIG, CHOLHDL, LDLDIRECT in the last 72 hours. Thyroid Function Tests: No results for input(s): TSH, T4TOTAL, FREET4, T3FREE, THYROIDAB in the last 72 hours. Anemia Panel: No results for input(s): VITAMINB12, FOLATE, FERRITIN, TIBC, IRON, RETICCTPCT in the last 72 hours. Sepsis Labs: Recent Labs  Lab 09/09/20 1143  LATICACIDVEN 1.2    Recent Results (from the past 240 hour(s))  Resp Panel by RT-PCR (Flu A&B, Covid) Nasopharyngeal Swab     Status: None   Collection Time: 09/09/20  3:22 PM   Specimen: Nasopharyngeal Swab;  Nasopharyngeal(NP) swabs in vial transport medium  Result Value Ref Range Status   SARS Coronavirus 2 by RT PCR NEGATIVE NEGATIVE Final    Comment: (NOTE) SARS-CoV-2 target nucleic acids are NOT DETECTED.  The SARS-CoV-2 RNA is generally detectable in upper respiratory specimens during the acute phase of infection. The lowest concentration of SARS-CoV-2 viral copies this assay can detect is 138 copies/mL. A negative result does not preclude SARS-Cov-2 infection and should not be used as the sole basis for treatment or other patient management decisions. A negative result may occur with  improper specimen collection/handling, submission of specimen other than nasopharyngeal swab, presence of viral mutation(s) within the areas targeted by this assay, and inadequate number of viral copies(<138 copies/mL). A negative result must be combined with clinical observations, patient history, and epidemiological information. The expected result is Negative.  Fact Sheet for Patients:  EntrepreneurPulse.com.au  Fact Sheet for Healthcare Providers:  IncredibleEmployment.be  This test is no t yet approved or cleared by the [place] FDA and  has been authorized for detection and/or diagnosis of SARS-CoV-2 by FDA under an Emergency Use Authorization (EUA). This EUA will remain  in effect (meaning this test can be used) for the duration of the COVID-19 declaration under Section 564(b)(1) of the Act, 21 U.S.C.section 360bbb-3(b)(1), unless the authorization is terminated  or revoked sooner.       Influenza A by PCR NEGATIVE NEGATIVE Final   Influenza B by PCR NEGATIVE NEGATIVE Final    Comment: (NOTE) The Xpert Xpress SARS-CoV-2/FLU/RSV plus assay is intended as an aid in the diagnosis of influenza from Nasopharyngeal swab specimens and should not be used as a sole basis for treatment. Nasal washings and aspirates are unacceptable for Xpert Xpress  SARS-CoV-2/FLU/RSV testing.  Fact Sheet for Patients: EntrepreneurPulse.com.au  Fact Sheet for Healthcare Providers: IncredibleEmployment.be  This test is not yet approved or cleared by the [place] FDA and has been authorized for detection and/or diagnosis of SARS-CoV-2 by FDA under an Emergency Use Authorization (EUA). This EUA will remain in effect (meaning this test can be used) for the duration of the COVID-19 declaration under Section 564(b)(1) of the Act, 21 U.S.C. section 360bbb-3(b)(1), unless the authorization is terminated or revoked.  Performed at [facility], [place]  [address] [place], [place] [address]      Radiology Studies: DG Abdomen Acute W/Chest  Result Date: 09/09/2020 CLINICAL DATA:  Emesis EXAM: DG ABDOMEN ACUTE WITH 1 VIEW CHEST COMPARISON:  None. FINDINGS: Suboptimal visualization due to body habitus. There are no significantly dilated loops identified. No free air. No air-fluid levels. Cholecystectomy clips. No consolidation or edema. No pleural effusion. Heart size is normal for technique. Right shoulder arthroplasty. Advanced left glenohumeral osteoarthritis. Right hip arthroplasty. IMPRESSION: Nonobstructive bowel gas pattern. Electronically Signed   By: Macy Mis M.D.   On: 09/09/2020 13:11     LOS: 1 day   Antonieta Pert, MD [facility]  09/10/2020, 10:57 AM

## 2020-09-10 NOTE — Progress Notes (Signed)
[name]   DOB:Oct 12, 1940   EH#:[id number]    ASSESSMENT & PLAN:   Ovarian cancer I have reviewed her recent CT imaging this past weekend Unfortunately, it was done without oral contrast There is significant limitation on the CT imaging especially with large amount of ascites I recommend therapeutic paracentesis with the attempt to drain as much fluid as possible If she has symptomatic improvement after that, I plan to order CT imaging of the abdomen and pelvis with oral contrast for further assessment of disease response to treatment In the meantime, we will continue supportive care  Abdominal pain, mild dilated loops of bowel I am concerned that she might have early signs of small bowel obstruction She has intermittent abdominal pain, severe nausea and vomiting, changes in bowel habits and bloating I recommend IV fluid support, IV pain medicine as needed as well as IV antiemetics  Malignant ascites She is profoundly symptomatic I plan to order large volume paracentesis  Elevated LFTs This is highly unusual 2 weeks out from recent chemotherapy Observe closely for now  Mild pancytopenia Due to recent treatment She does not need G-CSF support  Goals of care Resolution of her bowel symptoms and able to eat without nausea and vomiting  Discharge planning She will likely be here for 2 to 3 days I have updated her daughter this morning Multiple family members are asking me to call them and leaving multiple voicemails I told her daughter I will be reaching out to her only every day for updates  All questions were answered. The patient knows to call the clinic with any problems, questions or concerns.    [name], MD 09/10/2020 8:17 AM  Subjective:  She is not feeling well She has significant nausea and significant abdominal discomfort She did not sleep well Her pain is well controlled   Objective:  Vitals:   09/10/20 0150 09/10/20 0610  BP: (!) 159/68 (!)  151/85  Pulse: 97 86  Resp: 16 16  Temp: 97.9 F (36.6 C) 98.5 F (36.9 C)  SpO2: 96% 97%     Intake/Output Summary (Last 24 hours) at 09/10/2020 0817 Last data filed at 09/10/2020 8366 Gross per 24 hour  Intake 605.09 ml  Output 200 ml  Net 405.09 ml    GENERAL:alert but appears miserable SKIN: She looks pale EYES: normal, Conjunctiva are pink and non-injected, sclera clear OROPHARYNX:no exudate, no erythema and lips, buccal mucosa, and tongue normal  NECK: supple, thyroid normal size, non-tender, without nodularity LYMPH:  no palpable lymphadenopathy in the cervical, axillary or inguinal LUNGS: clear to auscultation and percussion with normal breathing effort HEART: regular rate & rhythm and no murmurs and no lower extremity edema ABDOMEN:abdomen profoundly distended with ascites Musculoskeletal:no cyanosis of digits and no clubbing  NEURO: alert & oriented x 3 with fluent speech, no focal motor/sensory deficits   Labs:  Recent Labs    09/07/20 2310 09/09/20 1052 09/10/20 0536  NA 137 139 137  K 4.1 3.8 3.6  CL 101 99 99  CO2 24 27 25   GLUCOSE 110* 144* 122*  BUN 36* 43* 38*  CREATININE 0.83 1.02* 0.98  CALCIUM 8.8* 8.9 8.3*  GFRNONAA >60 55* 58*  PROT 6.3* 6.6 5.4*  ALBUMIN 3.3* 3.4* 2.9*  AST 19 85* 42*  ALT 22 201* 124*  ALKPHOS 70 239* 164*  BILITOT 0.4 0.9 1.1    Studies:  CT ABDOMEN PELVIS W CONTRAST  Result Date: 09/08/2020 CLINICAL DATA:  Right upper quadrant swelling,  intermittent pain, bearing cancer with malignant ascites EXAM: CT ABDOMEN AND PELVIS WITH CONTRAST TECHNIQUE: Multidetector CT imaging of the abdomen and pelvis was performed using the standard protocol following bolus administration of intravenous contrast. CONTRAST:  147mL OMNIPAQUE IOHEXOL 300 MG/ML  SOLN COMPARISON:  07/22/2020 FINDINGS: Lower chest: No acute pleural or parenchymal lung disease. Calcified paraesophageal nodules measuring up to 6 mm unchanged, consistent with known  metastatic ovarian cancer. Hepatobiliary: Stable indeterminate subcentimeter liver hypodensities. No intrahepatic biliary duct dilation. Gallbladder surgically absent. Pancreas: Unremarkable. No pancreatic ductal dilatation or surrounding inflammatory changes. Spleen: Stable indeterminate hypodensity posterior subcapsular region of the spleen, likely a small hemangioma. Adrenals/Urinary Tract: Stable left renal cyst. The kidneys enhance normally and symmetrically without obstructive uropathy. No urinary tract calculi. The adrenals are unremarkable. The bladder is decompressed, limiting its evaluation. Stomach/Bowel: Fluid-filled mildly dilated loops of jejunum within the left mid abdomen measure up to 2.4 cm in size, nonspecific. There are some scattered gas fluid levels consistent with regional ileus. Distal small bowel is relatively decompressed. There is gas and stool throughout the colon to the level of the rectum. Vascular/Lymphatic: Mild atherosclerosis unchanged. Multiple calcified retroperitoneal nodules consistent with metastatic disease. No new adenopathy. Reproductive: Enlarged calcified mass arising from the right adnexa measuring approximately 14 x 15 cm, not significantly changed since prior study. Calcified peritoneal masses within the central mesentery are again noted, without significant change since prior exam. Largest calcified mass within the central abdomen image 40/2 measures 3.4 x 2.4 cm, previous having measured approximately 3.7 x 2.1 cm. Findings are consistent with known history of metastatic ovarian cancer. Other: There is moderate ascites throughout the abdomen and pelvis, decreased since previous CT scan. No free intraperitoneal gas. No abdominal wall hernia. Musculoskeletal: Unremarkable right hip arthroplasty. No acute or destructive bony lesions. Stable intramuscular lipoma left lateral abdominal wall. Reconstructed images demonstrate no additional findings. IMPRESSION: 1. Large  calcified pelvic mass arising from the right adnexa, consistent with known history of ovarian cancer. 2. Stable calcified metastatic implants throughout the mesentery. 3. Stable calcified nodules within the retroperitoneum, epiphrenic region, and paraesophageal regions, consistent with metastatic disease. 4. Moderate ascites. 5. Likely ileus involving the proximal to mid jejunum. No evidence of high-grade obstruction. Electronically Signed   By: Randa Ngo M.D.   On: 09/08/2020 03:59   US Paracentesis  Result Date: 09/03/2020 INDICATION: Patient with history of ovarian cancer with recurrent malignant ascites. Request received for therapeutic paracentesis up to 5 liters. EXAM: ULTRASOUND GUIDED THERAPEUTIC PARACENTESIS MEDICATIONS: 1% lidocaine to skin and subcutaneous tissue COMPLICATIONS: None immediate. PROCEDURE: Informed written consent was obtained from the patient after a discussion of the risks, benefits and alternatives to treatment. A timeout was performed prior to the initiation of the procedure. Initial ultrasound scanning demonstrates a large amount of ascites within the left lower abdominal quadrant. The left lower abdomen was prepped and draped in the usual sterile fashion. 1% lidocaine was used for local anesthesia. Following this, a 19 gauge, 10-cm, Yueh catheter was introduced. An ultrasound image was saved for documentation purposes. The paracentesis was performed. The catheter was removed and a dressing was applied. The patient tolerated the procedure well without immediate post procedural complication. FINDINGS: A total of approximately 5 liters of yellow fluid was removed. IMPRESSION: Successful ultrasound-guided therapeutic paracentesis yielding 5 liters of peritoneal fluid. Read by: Rowe Robert, PA-C Electronically Signed   By: Jacqulynn Cadet M.D.   On: 09/03/2020 15:28   DG Abdomen Acute W/Chest  Result Date:  09/09/2020 CLINICAL DATA:  Emesis EXAM: DG ABDOMEN ACUTE WITH 1 VIEW  CHEST COMPARISON:  None. FINDINGS: Suboptimal visualization due to body habitus. There are no significantly dilated loops identified. No free air. No air-fluid levels. Cholecystectomy clips. No consolidation or edema. No pleural effusion. Heart size is normal for technique. Right shoulder arthroplasty. Advanced left glenohumeral osteoarthritis. Right hip arthroplasty. IMPRESSION: Nonobstructive bowel gas pattern. Electronically Signed   By: Macy Mis M.D.   On: 09/09/2020 13:11

## 2020-09-10 NOTE — Telephone Encounter (Signed)
Mr. Mauney called and left a long message with concerns that he is not able to see Kiyoko for 2 days in the hospital because they found a bed bug in her sheets.  He has multiple questions for Dr. Alvy Bimler.

## 2020-09-10 NOTE — Progress Notes (Signed)
PT Cancellation Note  Patient Details Name: MAYLYN NARVAIZ MRN: [id number] DOB: 1940/09/24   Cancelled Treatment:    Reason Eval/Treat Not Completed: PT screened, no needs identified, will sign off, RN reports patient up without need for assistance. Please reorder if status changes.   Claretha Cooper 09/10/2020, 2:05 PM  [place] Pager [phone] Office [phone]

## 2020-09-10 NOTE — Procedures (Signed)
PROCEDURE SUMMARY:  Successful US guided paracentesis from right abdomen.  Yielded 2.9 L of clear yellow fluid.  No immediate complications.  Pt tolerated well.   EBL < 1 mL  Theresa Duty, NP 09/10/2020 4:29 PM

## 2020-09-10 NOTE — Telephone Encounter (Signed)
I will call daughter later

## 2020-09-11 ENCOUNTER — Inpatient Hospital Stay (HOSPITAL_COMMUNITY): Payer: Medicare Other

## 2020-09-11 DIAGNOSIS — B889 Infestation, unspecified: Secondary | ICD-10-CM

## 2020-09-11 DIAGNOSIS — R110 Nausea: Secondary | ICD-10-CM

## 2020-09-11 LAB — COMPREHENSIVE METABOLIC PANEL
ALT: 90 U/L — ABNORMAL HIGH (ref 0–44)
AST: 34 U/L (ref 15–41)
Albumin: 2.7 g/dL — ABNORMAL LOW (ref 3.5–5.0)
Alkaline Phosphatase: 128 U/L — ABNORMAL HIGH (ref 38–126)
Anion gap: 16 — ABNORMAL HIGH (ref 5–15)
BUN: 36 mg/dL — ABNORMAL HIGH (ref 8–23)
CO2: 22 mmol/L (ref 22–32)
Calcium: 8.6 mg/dL — ABNORMAL LOW (ref 8.9–10.3)
Chloride: 100 mmol/L (ref 98–111)
Creatinine, Ser: 0.98 mg/dL (ref 0.44–1.00)
GFR, Estimated: 58 mL/min — ABNORMAL LOW (ref >60)
Glucose, Bld: 101 mg/dL — ABNORMAL HIGH (ref 70–99)
Potassium: 3.6 mmol/L (ref 3.5–5.1)
Sodium: 138 mmol/L (ref 135–145)
Total Bilirubin: 0.9 mg/dL (ref 0.3–1.2)
Total Protein: 5.3 g/dL — ABNORMAL LOW (ref 6.5–8.1)

## 2020-09-11 LAB — CBC
HCT: 33 % — ABNORMAL LOW (ref 36.0–46.0)
Hemoglobin: 10.7 g/dL — ABNORMAL LOW (ref 12.0–15.0)
MCH: 28.6 pg (ref 26.0–34.0)
MCHC: 32.4 g/dL (ref 30.0–36.0)
MCV: 88.2 fL (ref 80.0–100.0)
Platelets: 204 10*3/uL (ref 150–400)
RBC: 3.74 MIL/uL — ABNORMAL LOW (ref 3.87–5.11)
RDW: 15.5 % (ref 11.5–15.5)
WBC: 3.2 10*3/uL — ABNORMAL LOW (ref 4.0–10.5)
nRBC: 0 % (ref 0.0–0.2)

## 2020-09-11 MED ORDER — LIP MEDEX EX OINT
TOPICAL_OINTMENT | CUTANEOUS | Status: AC
  Filled 2020-09-11: qty 7

## 2020-09-11 MED ORDER — SODIUM CHLORIDE 0.9 % IV SOLN: INTRAVENOUS | Status: DC

## 2020-09-11 MED ORDER — IOHEXOL 9 MG/ML PO SOLN
500.0000 mL | ORAL | Status: AC
  Administered 2020-09-11 (×2): 500 mL via ORAL

## 2020-09-11 MED ORDER — IOHEXOL 9 MG/ML PO SOLN
ORAL | Status: AC
  Administered 2020-09-11: 500 mL
  Filled 2020-09-11: qty 1000

## 2020-09-11 MED ORDER — IOHEXOL 300 MG/ML  SOLN
100.0000 mL | Freq: Once | INTRAMUSCULAR | Status: AC | PRN
  Administered 2020-09-11: 100 mL via INTRAVENOUS

## 2020-09-11 NOTE — Progress Notes (Signed)
Partial small bowel obstruction. Transition zone in left lower quadrant - result called by radiologist Dr. Sherrye Payor. NG tube ordered, will keep patient NPO. Gen surg consult ordered, but will need to be called in the AM.

## 2020-09-11 NOTE — Progress Notes (Signed)
PROGRESS NOTE    [name]  XTA:[id number] DOB: 1940-09-21 DOA: 09/09/2020 PCP: [name], MD   Chief Complaint  Patient presents with  . Emesis  Brief Narrative: 80 year old female with history of ovarian cancer on chemotherapy, malignant ascites status post paracentesis 3/8, PMR, A. fib not on anticoagulation, history of cholecystectomy appendicectomy, hyperlipidemia admitted with intractable nausea vomiting and abdominal pain x2 days.  She was seen 2 days prior to admission in the ED and was discharged on pain medication and nausea medication.  She was then seen by her oncologist Dr Alvy Bimler in the ED and there was concern for early SBO and recommended hospital admission. In the ED patient is afebrile hypertensive blood work showed alk phos 239 lipase 27 AST 85 ALT 201 total bili 0.9 lactic acid normal mild leukopenia 2.0 COVID-19 pending-CT abdomen pelvis with IV contrast without oral contrast (09/08/2020)-suspect ileus involving proximal mid jejunum with no evidence of high-grade obstruction, moderate ascites and other stable findings.  Chest and abdomen XR-nonobstructive bowel gas pattern.  Patient received Zofran, 1 L NS bolus, Compazine.  Patient followed by oncology underwent abdomen 3/15.  Subjective: Seen and examined this morning.  Earlier patient and husband have been wanting to leave.  Dr. Henreitta Cea suggest talk to them they are agreeable to stay in her further management CT scan pending  Patient had small amount of solid stool.   Otherwise no new complaints.    Assessment & Plan: Intractable nausea vomiting CT scan 3/13 suspect ileus without high-grade obstruction X-ray on admission unremarkable.  Concern for SBO CT scan abdomen ordered with oral contrast.  Discussed with oncology.  Keep on IV fluid hydration, pain control, NPO.   Ovarian cancer on chemotherapy Malignant ascites recent paracentesis 3/8 Status post abdominal paracentesis 3/15 with 2.9 L fluid removal.   No significant abdominal pain present.  Abdomen appears full.   Abnormal LFTs History of cholecystectomy Liver function test improving.  Monitor.  Recent Labs  Lab 09/07/20 2310 09/09/20 1052 09/10/20 0536 09/11/20 0752  AST 19 85* 42* 34  ALT 22 201* 124* 90*  ALKPHOS 70 239* 164* 128*  BILITOT 0.4 0.9 1.1 0.9  PROT 6.3* 6.6 5.4* 5.3*  ALBUMIN 3.3* 3.4* 2.9* 2.7*   Mild pancytopenia with leukopenia likely from chemotherapy: Oncology on board. WBC count up, hb stable. Recent Labs  Lab 09/09/20 1052 09/10/20 0536 09/11/20 0752  HGB 11.9* 10.5* 10.7*  HCT 36.2 31.8* 33.0*  WBC 2.0* 2.5* 3.2*  PLT 268 227 204   A. fib currently in sinus not on anticoagulation at baseline  Generalized deconditioning/weakness patient appears to be ambulating.  Failure to thrive deconditioning, continue on Decadron.  Encourage oral intake.    Goals of care full code, appreciate oncology follow-up on board.  Diet Order            Diet NPO time specified Except for: Sips with Meds  Diet effective now               Patient's Body mass index is 23.62 kg/m. DVT prophylaxis: enoxaparin (LOVENOX) injection 40 mg Start: 09/09/20 2200 Code Status:   Code Status: Full Code  Family Communication: plan of care discussed with patient at bedside.  Husband updated at the bedside discussed oncology Dr. Alvy Bimler has been updating daughter  Status is: Inpatient Remains inpatient appropriate because:Ongoing diagnostic testing needed not appropriate for outpatient work up, Unsafe d/c plan and Inpatient level of care appropriate due to severity of illness  Dispo:  The patient is from: Home              Anticipated d/c is to: Home once okay with oncology.              Patient currently is not medically stable to d/c.   Difficult to place patient No   Unresulted Labs (From admission, onward)          Start     Ordered   09/10/20 0500  CBC  Daily,   R      09/09/20 1707   09/10/20 0500  Comprehensive  metabolic panel  Daily,   R      09/09/20 1707        Medications reviewed:  Scheduled Meds: . dexamethasone (DECADRON) injection  4 mg Intravenous Q12H  . enoxaparin (LOVENOX) injection  40 mg Subcutaneous Q24H  . iohexol  500 mL Oral Q1H  . sodium chloride flush  3 mL Intravenous Q12H   Continuous Infusions: . sodium chloride 50 mL/hr at 09/11/20 0949  . ondansetron [facility]) IV 8 mg (09/10/20 1737)    Consultants:see note  Procedures:see note  Antimicrobials: Anti-infectives (From admission, onward)   None     Culture/Microbiology    Component Value Date/Time   SDES  09/09/2020 1148    BLOOD LEFT ANTECUBITAL Performed at [facility], [place] [address] [place], [place] [address]    [place]  09/09/2020 1148    BOTTLES DRAWN AEROBIC AND ANAEROBIC Blood Culture adequate volume Performed at [place] [address] [place], [place] [address]    CULT  09/09/2020 1148    NO GROWTH 2 DAYS Performed at [facility], [place] [address] [place],  [address]    REPTSTATUS PENDING 09/09/2020 1148    Other culture-see note  Objective: Vitals: Today's Vitals   09/10/20 2040 09/10/20 2140 09/11/20 0543 09/11/20 0800  BP: (!) 154/94  (!) 163/75   Pulse: 85  84   Resp: 16  18   Temp: 98.5 F (36.9 C)  98.2 F (36.8 C)   TempSrc: Oral  Oral   SpO2: 98%  96%   Weight:      Height:      PainSc:  0-No pain  0-No pain    Intake/Output Summary (Last 24 hours) at 09/11/2020 1101 Last data filed at 09/11/2020 0547 Gross per 24 hour  Intake --  Output 550 ml  Net -550 ml   Filed Weights   09/09/20 1039  Weight: 56.7 kg   Weight change:   Intake/Output from previous day: 03/15 0701 - 03/16 0700 In: -  Out: 550 [Urine:550] Intake/Output this shift: No intake/output data recorded. Filed Weights   09/09/20 1039  Weight: 56.7 kg    Examination: General exam: AAO x3, not in distress, thin and frail looking.weak  appearing. HEENT:Oral mucosa moist, Ear/Nose WNL grossly, dentition normal. Respiratory system: bilaterally diminishedd,no wheezing or crackles,no use of accessory muscle Cardiovascular system: S1 & S2 +, No JVD,. Gastrointestinal system: Abdomen soft, mildly tender appears full, bowel sounds + Nervous System:Alert, awake, moving extremities and grossly nonfocal Extremities: No edema, distal peripheral pulses palpable.  Skin: No rashes,no icterus. MSK: Normal muscle bulk,tone, power  Data Reviewed: I have personally reviewed following labs and imaging studies CBC: Recent Labs  Lab 09/07/20 2318 09/09/20 1052 09/10/20 0536 09/11/20 0752  WBC 3.5* 2.0* 2.5* 3.2*  NEUTROABS 1.8 1.2*  --   --   HGB 11.1* 11.9* 10.5* 10.7*  HCT 34.3* 36.2 31.8* 33.0*  MCV 87.9 85.4 86.9 88.2  PLT 277 268 227 676   Basic Metabolic Panel: Recent Labs  Lab 09/07/20 2310 09/09/20 1052 09/10/20 0536 09/11/20 0752  NA 137 139 137 138  K 4.1 3.8 3.6 3.6  CL 101 99 99 100  CO2 24 27 25 22   GLUCOSE 110* 144* 122* 101*  BUN 36* 43* 38* 36*  CREATININE 0.83 1.02* 0.98 0.98  CALCIUM 8.8* 8.9 8.3* 8.6*   GFR: Estimated Creatinine Clearance: 34 mL/min (by C-G formula based on SCr of 0.98 mg/dL). Liver Function Tests: Recent Labs  Lab 09/07/20 2310 09/09/20 1052 09/10/20 0536 09/11/20 0752  AST 19 85* 42* 34  ALT 22 201* 124* 90*  ALKPHOS 70 239* 164* 128*  BILITOT 0.4 0.9 1.1 0.9  PROT 6.3* 6.6 5.4* 5.3*  ALBUMIN 3.3* 3.4* 2.9* 2.7*   Recent Labs  Lab 09/07/20 2310 09/09/20 1052  LIPASE 56* 27   No results for input(s): AMMONIA in the last 168 hours. Coagulation Profile: No results for input(s): INR, PROTIME in the last 168 hours. Cardiac Enzymes: No results for input(s): CKTOTAL, CKMB, CKMBINDEX, TROPONINI in the last 168 hours. BNP (last 3 results) No results for input(s): PROBNP in the last 8760 hours. HbA1C: No results for input(s): HGBA1C in the last 72 hours. CBG: No  results for input(s): GLUCAP in the last 168 hours. Lipid Profile: No results for input(s): CHOL, HDL, LDLCALC, TRIG, CHOLHDL, LDLDIRECT in the last 72 hours. Thyroid Function Tests: No results for input(s): TSH, T4TOTAL, FREET4, T3FREE, THYROIDAB in the last 72 hours. Anemia Panel: No results for input(s): VITAMINB12, FOLATE, FERRITIN, TIBC, IRON, RETICCTPCT in the last 72 hours. Sepsis Labs: Recent Labs  Lab 09/09/20 1143  LATICACIDVEN 1.2    Recent Results (from the past 240 hour(s))  Blood culture (routine x 2)     Status: None (Preliminary result)   Collection Time: 09/09/20 11:43 AM   Specimen: BLOOD  Result Value Ref Range Status   Specimen Description   Final    BLOOD BLOOD RIGHT FOREARM Performed at [place] [address], [place], [place] [address]    Special Requests   Final    BOTTLES DRAWN AEROBIC AND ANAEROBIC Blood Culture adequate volume Performed at [place] [address] [place], [place] [address]    Culture   Final    NO GROWTH 2 DAYS Performed at [place] [address], [place], [place] [address]    Report Status PENDING  Incomplete  Blood culture (routine x 2)     Status: None (Preliminary result)   Collection Time: 09/09/20 11:48 AM   Specimen: BLOOD  Result Value Ref Range Status   Specimen Description   Final    BLOOD LEFT ANTECUBITAL Performed at [place] [address], [place], [place] [address]    Special Requests   Final    BOTTLES DRAWN AEROBIC AND ANAEROBIC Blood Culture adequate volume Performed at [place] [address] [place], [place] [address]    Culture   Final    NO GROWTH 2 DAYS Performed at [facility] [address] [place], [place] [address]    Report Status PENDING  Incomplete  Resp Panel by RT-PCR (Flu A&B, Covid) Nasopharyngeal Swab     Status: None   Collection Time: 09/09/20  3:22 PM   Specimen:  Nasopharyngeal Swab; Nasopharyngeal(NP) swabs in vial transport medium  Result Value Ref Range Status   SARS Coronavirus 2 by RT PCR NEGATIVE NEGATIVE Final    Comment: (NOTE) SARS-CoV-2 target nucleic acids are NOT DETECTED.  The SARS-CoV-2 RNA is generally detectable in upper respiratory specimens during the acute phase of infection. The lowest concentration of SARS-CoV-2 viral copies this assay can detect is 138 copies/mL. A negative result does not preclude SARS-Cov-2 infection and should not be used as the sole basis for treatment or other patient management decisions. A negative result may occur with  improper specimen collection/handling, submission of specimen other than nasopharyngeal swab, presence of viral mutation(s) within the areas targeted by this assay, and inadequate number of viral copies(<138 copies/mL). A negative result must be combined with clinical observations, patient history, and epidemiological information. The expected result is Negative.  Fact Sheet for Patients:  EntrepreneurPulse.com.au  Fact Sheet for Healthcare Providers:  IncredibleEmployment.be  This test is no t yet approved or cleared by the [place] FDA and  has been authorized for detection and/or diagnosis of SARS-CoV-2 by FDA under an Emergency Use Authorization (EUA). This EUA will remain  in effect (meaning this test can be used) for the duration of the COVID-19 declaration under Section 564(b)(1) of the Act, 21 U.S.C.section 360bbb-3(b)(1), unless the authorization is terminated  or revoked sooner.       Influenza A by PCR NEGATIVE NEGATIVE Final   Influenza B by PCR NEGATIVE NEGATIVE Final    Comment: (NOTE) The Xpert Xpress SARS-CoV-2/FLU/RSV plus assay is intended as an aid in the diagnosis of influenza from Nasopharyngeal swab specimens and should not be used as a sole basis for treatment. Nasal washings and aspirates are unacceptable for  Xpert Xpress SARS-CoV-2/FLU/RSV testing.  Fact Sheet for Patients: EntrepreneurPulse.com.au  Fact Sheet for Healthcare Providers: IncredibleEmployment.be  This test is not yet approved or cleared by the [place] FDA and has been authorized for detection and/or diagnosis of SARS-CoV-2 by FDA under an Emergency Use Authorization (EUA). This EUA will remain in effect (meaning this test can be used) for the duration of the COVID-19 declaration under Section 564(b)(1) of the Act, 21 U.S.C. section 360bbb-3(b)(1), unless the authorization is terminated or revoked.  Performed at [facility], [place] [address] [place], [place] [address]      Radiology Studies: US Paracentesis  Result Date: 09/10/2020 INDICATION: Patient with a history of ovarian cancer and recurrent malignant ascites. Interventional radiology asked to perform a therapeutic paracentesis. EXAM: ULTRASOUND GUIDED PARACENTESIS MEDICATIONS: 1% lidocaine 10 mL COMPLICATIONS: None immediate. PROCEDURE: Informed written consent was obtained from the patient after a discussion of the risks, benefits and alternatives to treatment. A timeout was performed prior to the initiation of the procedure. Initial ultrasound scanning demonstrates a large amount of ascites within the right lower abdominal quadrant. The right lower abdomen was prepped and draped in the usual sterile fashion. 1% lidocaine was used for local anesthesia. Following this, a 19 gauge, 7-cm, Yueh catheter was introduced. An ultrasound image was saved for documentation purposes. The paracentesis was performed. The catheter was removed and a dressing was applied. The patient tolerated the procedure well without immediate post procedural complication. FINDINGS: A total of approximately 2.9 L of clear yellow fluid was removed. IMPRESSION: Successful ultrasound-guided paracentesis yielding 2.9 liters of peritoneal fluid. Read  by: Soyla Dryer, NP Electronically Signed   By: Sandi Mariscal M.D.   On: 09/10/2020 16:28   DG Abdomen Acute W/Chest  Result Date: 09/09/2020 CLINICAL DATA:  Emesis EXAM:  DG ABDOMEN ACUTE WITH 1 VIEW CHEST COMPARISON:  None. FINDINGS: Suboptimal visualization due to body habitus. There are no significantly dilated loops identified. No free air. No air-fluid levels. Cholecystectomy clips. No consolidation or edema. No pleural effusion. Heart size is normal for technique. Right shoulder arthroplasty. Advanced left glenohumeral osteoarthritis. Right hip arthroplasty. IMPRESSION: Nonobstructive bowel gas pattern. Electronically Signed   By: Macy Mis M.D.   On: 09/09/2020 13:11     LOS: 2 days   Antonieta Pert, MD [facility]  09/11/2020, 11:01 AM

## 2020-09-11 NOTE — Progress Notes (Signed)
[name] [facility]   DOB:03/15/1941   GH#:[id number]    ASSESSMENT & PLAN:  Ovarian cancer I have reviewed her recent CT imaging this past weekend Unfortunately, it was done without oral contrast There is significant limitation on the CT imaging especially with large amount of ascites I recommend therapeutic paracentesis with the attempt to drain as much fluid as possible; she had 2.9 liters of ascitic removed The patient felt better but she has persistent sensation of nausea We have a long discussion the importance of taking oral contrast prior to CT imaging and she is willing to try I made it clear to the patient and her husband why leaving the hospital at this point without complete work-up is inadvisable and is considered AGAINST MEDICAL ADVICE Ultimately, she is in agreement to try to drink the oral contrast before CT imaging  Abdominal pain, mild dilated loops of bowel I am concerned that she might have early signs of small bowel obstruction She has intermittent abdominal pain, severe nausea and vomiting, changes in bowel habits and bloating I recommend IV fluid support, IV pain medicine as needed as well as IV antiemetics I have a long discussion explaining the importance of getting the work-up done while she is hospitalized There is no way she can tolerate this as an outpatient We will try water-based contrast today and to give her slightly more time to drink the contrast before CT today  Uncontrolled nausea Likely due to early small bowel obstruction I recommend gentle fluid hydration and continue IV antiemetics  Malignant ascites She is profoundly symptomatic yesterday Surprisingly, only 2.9 L of fluid is removed She appears bloated I explained to her the rationale of why she needs CT scan to be done prior to discharge  Elevated LFTs This is highly unusual 2 weeks out from recent chemotherapy Observe closely for now It is trending down  Mild pancytopenia Due to recent  treatment She does not need G-CSF support It is improving  Goals of care Resolution of her bowel symptoms and able to eat without nausea and vomiting I made it very clear to the patient and her husband I do not advise her to leave until she can eat a normal meal without nausea and vomiting  Discharge planning She will likely be here for 2 to 3 days I have spent more than an hour yesterday communicating with the patient's 2 daughters Today, I spent over an hour again talking to her daughter and the patient and husband  Her husband have left numerous messages every few hours requesting a lot of things in the hospital I explained to her and her husband I have no jurisdiction in the hospital and I am not responsible for her care directly while she is hospitalized If there are immediate needs, she needs to consult with nursing staff that is assigned to her in the hospital and communicate with primary service I also explained to them multiple times it is inappropriate to leave [place] until work-up is completed Her 2 daughters are fearful that the patient would leave the hospital prematurely but they are hopeless against the apparent's wishes At the end of today's visit, I am comforted the patient will stay and complete her investigation as above Overall, I spent roughly 70 minutes on reviewing plan of care with family, patient and primary service  All questions were answered. The patient knows to call the clinic with any problems, questions or concerns.   [name], MD 09/11/2020 10:49 AM  Subjective:  Since I saw her yesterday morning, her husband left numerous messages on the voicemail and demanded to speak to me despite the fact every seen the patient and reviewed the plan of care with her 2 daughters She had paracentesis yesterday and felt better The patient has demanded to leave because she has not eaten and would like to complete treatment as an outpatient She was  advised not to leave because that would constitute leaving AGAINST MEDICAL ADVICE She could hardly drink 30 ounces of water per day and has not eaten due to uncontrolled nausea She had multiple bowel movement since admission She denies pain  Objective:  Vitals:   09/10/20 2040 09/11/20 0543  BP: (!) 154/94 (!) 163/75  Pulse: 85 84  Resp: 16 18  Temp: 98.5 F (36.9 C) 98.2 F (36.8 C)  SpO2: 98% 96%     Intake/Output Summary (Last 24 hours) at 09/11/2020 1049 Last data filed at 09/11/2020 0547 Gross per 24 hour  Intake --  Output 550 ml  Net -550 ml    GENERAL:alert, no distress and comfortable.  She looks cachectic and pale LUNGS: clear to auscultation and percussion with normal breathing effort HEART: regular rate & rhythm and no murmurs and no lower extremity edema ABDOMEN:abdomen soft, appears bloated, unable to appreciate residual ascites.  Very minimal bowel sounds noted Musculoskeletal:no cyanosis of digits and no clubbing  NEURO: alert & oriented x 3 with fluent speech, no focal motor/sensory deficits   Labs:  Recent Labs    09/09/20 1052 09/10/20 0536 09/11/20 0752  NA 139 137 138  K 3.8 3.6 3.6  CL 99 99 100  CO2 27 25 22   GLUCOSE 144* 122* 101*  BUN 43* 38* 36*  CREATININE 1.02* 0.98 0.98  CALCIUM 8.9 8.3* 8.6*  GFRNONAA 55* 58* 58*  PROT 6.6 5.4* 5.3*  ALBUMIN 3.4* 2.9* 2.7*  AST 85* 42* 34  ALT 201* 124* 90*  ALKPHOS 239* 164* 128*  BILITOT 0.9 1.1 0.9    Studies:  CT ABDOMEN PELVIS W CONTRAST  Result Date: 09/08/2020 CLINICAL DATA:  Right upper quadrant swelling, intermittent pain, bearing cancer with malignant ascites EXAM: CT ABDOMEN AND PELVIS WITH CONTRAST TECHNIQUE: Multidetector CT imaging of the abdomen and pelvis was performed using the standard protocol following bolus administration of intravenous contrast. CONTRAST:  169mL OMNIPAQUE IOHEXOL 300 MG/ML  SOLN COMPARISON:  07/22/2020 FINDINGS: Lower chest: No acute pleural or parenchymal  lung disease. Calcified paraesophageal nodules measuring up to 6 mm unchanged, consistent with known metastatic ovarian cancer. Hepatobiliary: Stable indeterminate subcentimeter liver hypodensities. No intrahepatic biliary duct dilation. Gallbladder surgically absent. Pancreas: Unremarkable. No pancreatic ductal dilatation or surrounding inflammatory changes. Spleen: Stable indeterminate hypodensity posterior subcapsular region of the spleen, likely a small hemangioma. Adrenals/Urinary Tract: Stable left renal cyst. The kidneys enhance normally and symmetrically without obstructive uropathy. No urinary tract calculi. The adrenals are unremarkable. The bladder is decompressed, limiting its evaluation. Stomach/Bowel: Fluid-filled mildly dilated loops of jejunum within the left mid abdomen measure up to 2.4 cm in size, nonspecific. There are some scattered gas fluid levels consistent with regional ileus. Distal small bowel is relatively decompressed. There is gas and stool throughout the colon to the level of the rectum. Vascular/Lymphatic: Mild atherosclerosis unchanged. Multiple calcified retroperitoneal nodules consistent with metastatic disease. No new adenopathy. Reproductive: Enlarged calcified mass arising from the right adnexa measuring approximately 14 x 15 cm, not significantly changed since prior study. Calcified peritoneal masses within the central mesentery are  again noted, without significant change since prior exam. Largest calcified mass within the central abdomen image 40/2 measures 3.4 x 2.4 cm, previous having measured approximately 3.7 x 2.1 cm. Findings are consistent with known history of metastatic ovarian cancer. Other: There is moderate ascites throughout the abdomen and pelvis, decreased since previous CT scan. No free intraperitoneal gas. No abdominal wall hernia. Musculoskeletal: Unremarkable right hip arthroplasty. No acute or destructive bony lesions. Stable intramuscular lipoma left  lateral abdominal wall. Reconstructed images demonstrate no additional findings. IMPRESSION: 1. Large calcified pelvic mass arising from the right adnexa, consistent with known history of ovarian cancer. 2. Stable calcified metastatic implants throughout the mesentery. 3. Stable calcified nodules within the retroperitoneum, epiphrenic region, and paraesophageal regions, consistent with metastatic disease. 4. Moderate ascites. 5. Likely ileus involving the proximal to mid jejunum. No evidence of high-grade obstruction. Electronically Signed   By: Randa Ngo M.D.   On: 09/08/2020 03:59   US Paracentesis  Result Date: 09/10/2020 INDICATION: Patient with a history of ovarian cancer and recurrent malignant ascites. Interventional radiology asked to perform a therapeutic paracentesis. EXAM: ULTRASOUND GUIDED PARACENTESIS MEDICATIONS: 1% lidocaine 10 mL COMPLICATIONS: None immediate. PROCEDURE: Informed written consent was obtained from the patient after a discussion of the risks, benefits and alternatives to treatment. A timeout was performed prior to the initiation of the procedure. Initial ultrasound scanning demonstrates a large amount of ascites within the right lower abdominal quadrant. The right lower abdomen was prepped and draped in the usual sterile fashion. 1% lidocaine was used for local anesthesia. Following this, a 19 gauge, 7-cm, Yueh catheter was introduced. An ultrasound image was saved for documentation purposes. The paracentesis was performed. The catheter was removed and a dressing was applied. The patient tolerated the procedure well without immediate post procedural complication. FINDINGS: A total of approximately 2.9 L of clear yellow fluid was removed. IMPRESSION: Successful ultrasound-guided paracentesis yielding 2.9 liters of peritoneal fluid. Read by: Soyla Dryer, NP Electronically Signed   By: Sandi Mariscal M.D.   On: 09/10/2020 16:28   US Paracentesis  Result Date:  09/03/2020 INDICATION: Patient with history of ovarian cancer with recurrent malignant ascites. Request received for therapeutic paracentesis up to 5 liters. EXAM: ULTRASOUND GUIDED THERAPEUTIC PARACENTESIS MEDICATIONS: 1% lidocaine to skin and subcutaneous tissue COMPLICATIONS: None immediate. PROCEDURE: Informed written consent was obtained from the patient after a discussion of the risks, benefits and alternatives to treatment. A timeout was performed prior to the initiation of the procedure. Initial ultrasound scanning demonstrates a large amount of ascites within the left lower abdominal quadrant. The left lower abdomen was prepped and draped in the usual sterile fashion. 1% lidocaine was used for local anesthesia. Following this, a 19 gauge, 10-cm, Yueh catheter was introduced. An ultrasound image was saved for documentation purposes. The paracentesis was performed. The catheter was removed and a dressing was applied. The patient tolerated the procedure well without immediate post procedural complication. FINDINGS: A total of approximately 5 liters of yellow fluid was removed. IMPRESSION: Successful ultrasound-guided therapeutic paracentesis yielding 5 liters of peritoneal fluid. Read by: Rowe Robert, PA-C Electronically Signed   By: Jacqulynn Cadet M.D.   On: 09/03/2020 15:28   DG Abdomen Acute W/Chest  Result Date: 09/09/2020 CLINICAL DATA:  Emesis EXAM: DG ABDOMEN ACUTE WITH 1 VIEW CHEST COMPARISON:  None. FINDINGS: Suboptimal visualization due to body habitus. There are no significantly dilated loops identified. No free air. No air-fluid levels. Cholecystectomy clips. No consolidation or edema. No pleural effusion.  Heart size is normal for technique. Right shoulder arthroplasty. Advanced left glenohumeral osteoarthritis. Right hip arthroplasty. IMPRESSION: Nonobstructive bowel gas pattern. Electronically Signed   By: Macy Mis M.D.   On: 09/09/2020 13:11

## 2020-09-11 NOTE — Progress Notes (Signed)
Patient's husband arrived to unit at 0620 with wheelchair and patient belongings from home. Patient then asked to be assisted to the bathroom. When RN entered the patient's room to assist, patient began to walk toward the wheelchair brought by patient's husband. Patient then stated she did not want to use the bathroom and wanted to sit in the wheelchair. Patient was educated on the importance of staying in bed due to risk of falls. Patient refused and stated she wanted to sit in wheelchair. Patient then stated she wanted to be assisted with having her clothing changed. Patient was assisted with clothing change. Patient's husband stated she has "been starved here at the hospital for 4 days." Patient and husband stating they want to leave AMA if Dr. Alvy Bimler is not in the room by 0700. Patient was educated on the [facility] process and that RN needs to be aware if patient decides to leave. Patient and husband are verbalized understanding.

## 2020-09-11 NOTE — TOC Initial Note (Signed)
Transition of Care [facility]) - Initial/Assessment Note    Patient Details  Name: [name] MRN: [id number] Date of Birth: 1941/05/28  [facility] CM/SW Contact:    Lynnell Catalan, RN Phone Number: 09/11/2020, 11:38 AM  Clinical Narrative:                 From home with spouse and independent with ADLs. TOC will follow for DC needs.  Expected Discharge Plan: Home/Self Care Barriers to Discharge: Continued Medical Work up   Expected Discharge Plan and Services Expected Discharge Plan: Home/Self Care   Discharge Planning Services: CM Consult   Living arrangements for the past 2 months: Single Family Home                     Prior Living Arrangements/Services Living arrangements for the past 2 months: Single Family Home Lives with:: Spouse Patient language and need for interpreter reviewed:: Yes        Need for Family Participation in Patient Care: Yes (Comment) Care giver support system in place?: Yes (comment)   Criminal Activity/Legal Involvement Pertinent to Current Situation/Hospitalization: No - Comment as needed  Activities of Daily Living Home Assistive Devices/Equipment: Eyeglasses ADL Screening (condition at time of admission) Patient's cognitive ability adequate to safely complete daily activities?: Yes Is the patient deaf or have difficulty hearing?: No Does the patient have difficulty seeing, even when wearing glasses/contacts?: No Does the patient have difficulty concentrating, remembering, or making decisions?: No Patient able to express need for assistance with ADLs?: Yes Does the patient have difficulty dressing or bathing?: No Independently performs ADLs?: Yes (appropriate for developmental age) Does the patient have difficulty walking or climbing stairs?: No Weakness of Legs: None Weakness of Arms/Hands: None  Permission Sought/Granted                  Emotional Assessment       Orientation: : Oriented to Self,Oriented to  Place,Oriented to  Time,Oriented to Situation Alcohol / Substance Use: Not Applicable    Admission diagnosis:  Abdominal pain [R10.9] Malignant neoplasm of ovary, unspecified laterality ([place]) [C56.9] Intractable vomiting with nausea, unspecified vomiting type [R11.2] Patient Active Problem List   Diagnosis Date Noted  . Abdominal pain 09/09/2020  . Intractable nausea and vomiting 09/09/2020  . Elevated LFTs 09/09/2020  . Nasal congestion 09/02/2020  . Anorexia 09/02/2020  . Generalized weakness 09/02/2020  . Weight loss, abnormal 07/31/2020  . Ovarian ca ([place]) 07/30/2020  . Pelvic mass 07/26/2020  . Malignant neoplasm of other specified female genital organs ([place])  07/26/2020  . Chronic kidney disease, stage 3 unspecified ([place]) 07/24/2020  . Allergic rhinitis 07/24/2020  . Amnesia 07/24/2020  . Malignant ascites 07/24/2020  . Chronic sinusitis 07/24/2020  . Constipation 07/24/2020  . Eczema 07/24/2020  . Hearing loss 07/24/2020  . History of pancreatitis 07/24/2020  . Iron deficiency 07/24/2020  . Knee pain 07/24/2020  . Nocturia 07/24/2020  . Osteopenia 07/24/2020  . Polymyalgia rheumatica ([place]) 07/24/2020  . Pure hypercholesterolemia 07/24/2020  . Rosacea 07/24/2020  . Sacroiliitis, not elsewhere classified ([place]) 07/24/2020  . Sleep disturbance 07/24/2020  . Spontaneous ecchymosis 07/24/2020  . Vitamin D deficiency 07/24/2020  . Prolapsed internal hemorrhoids 11/04/2017  . Primary osteoarthritis of both shoulders 07/09/2016  . Status post replacement of right shoulder joint 07/09/2016  . Primary osteoarthritis of one hip, right 12/25/2014  . Dermatochalasis 07/22/2012  . Atrial fibrillation ([place]) 09/17/2011  . Hyperlipidemia 09/17/2011   PCP:  [name], MD Pharmacy:  No Pharmacies Listed    Social Determinants of Health (SDOH) Interventions    Readmission Risk Interventions Readmission Risk Prevention Plan 09/11/2020  Transportation Screening  Complete  PCP or Specialist Appt within 3-5 Days Complete  HRI or [place] Complete  Social Work Consult for [place] Planning/Counseling Complete  Palliative Care Screening Not Applicable  Medication Review Press photographer) Complete  Some recent data might be hidden

## 2020-09-11 NOTE — Progress Notes (Signed)
CT results returned showing pSBO. On call provider put in order to place NG tube to LIWS and gastrografin protocol, as well as surgical consult. I discussed this with the pt and explained what the NG was for and what placing it would entail. She was agreeable to try. Agricultural consultant and I were able to place NG without difficulty. Placement confirmed by auscultation at bedside. Pt immediately began complaining that it felt "like a big glob in her throat" and to take it out. She was not coughing and tube was seen in back of throat going straight down. I tried to explain that it would be a bit uncomfortable but we needed it for a couple hours to get the gastrografin in to help guide her plan of care. She became angry and pushed our hands away and removed the NG tube. She said she was not doing that, or "any of this other shit" again and she wouldn't have came to the hospital if she knew she would be subjected to all of this. Pt may need a GOC discussion with providers. She then apologized to [place] and said she was tired of everything and just couldn't tolerate that NG tube. On call provider informed of events and acknowledged our efforts. Continue to monitor. Hortencia Conradi RN

## 2020-09-12 ENCOUNTER — Inpatient Hospital Stay (HOSPITAL_COMMUNITY): Payer: Medicare Other

## 2020-09-12 DIAGNOSIS — K56609 Unspecified intestinal obstruction, unspecified as to partial versus complete obstruction: Secondary | ICD-10-CM

## 2020-09-12 LAB — COMPREHENSIVE METABOLIC PANEL
ALT: 82 U/L — ABNORMAL HIGH (ref 0–44)
AST: 39 U/L (ref 15–41)
Albumin: 2.7 g/dL — ABNORMAL LOW (ref 3.5–5.0)
Alkaline Phosphatase: 114 U/L (ref 38–126)
Anion gap: 13 (ref 5–15)
BUN: 35 mg/dL — ABNORMAL HIGH (ref 8–23)
CO2: 20 mmol/L — ABNORMAL LOW (ref 22–32)
Calcium: 8 mg/dL — ABNORMAL LOW (ref 8.9–10.3)
Chloride: 101 mmol/L (ref 98–111)
Creatinine, Ser: 0.69 mg/dL (ref 0.44–1.00)
GFR, Estimated: >60 mL/min (ref >60)
Glucose, Bld: 78 mg/dL (ref 70–99)
Potassium: 3.8 mmol/L (ref 3.5–5.1)
Sodium: 134 mmol/L — ABNORMAL LOW (ref 135–145)
Total Bilirubin: 1 mg/dL (ref 0.3–1.2)
Total Protein: 5.1 g/dL — ABNORMAL LOW (ref 6.5–8.1)

## 2020-09-12 LAB — CBC
HCT: 33.3 % — ABNORMAL LOW (ref 36.0–46.0)
Hemoglobin: 11.2 g/dL — ABNORMAL LOW (ref 12.0–15.0)
MCH: 29.3 pg (ref 26.0–34.0)
MCHC: 33.6 g/dL (ref 30.0–36.0)
MCV: 87.2 fL (ref 80.0–100.0)
Platelets: 212 10*3/uL (ref 150–400)
RBC: 3.82 MIL/uL — ABNORMAL LOW (ref 3.87–5.11)
RDW: 15.7 % — ABNORMAL HIGH (ref 11.5–15.5)
WBC: 4.1 10*3/uL (ref 4.0–10.5)
nRBC: 0 % (ref 0.0–0.2)

## 2020-09-12 MED ORDER — POLYETHYLENE GLYCOL 3350 17 G PO PACK: 17.0000 g | PACK | Freq: Every day | ORAL | Status: DC | PRN

## 2020-09-12 MED ORDER — PROCHLORPERAZINE EDISYLATE 10 MG/2ML IJ SOLN
10.0000 mg | Freq: Four times a day (QID) | INTRAMUSCULAR | Status: DC
  Administered 2020-09-12 – 2020-09-13 (×4): 10 mg via INTRAVENOUS
  Filled 2020-09-12 (×4): qty 2

## 2020-09-12 MED ORDER — ENSURE ENLIVE PO LIQD
237.0000 mL | Freq: Three times a day (TID) | ORAL | Status: DC
  Administered 2020-09-12 – 2020-09-13 (×5): 237 mL via ORAL

## 2020-09-12 MED ORDER — DEXAMETHASONE SODIUM PHOSPHATE 4 MG/ML IJ SOLN: 4.0000 mg | INTRAMUSCULAR | Status: DC

## 2020-09-12 NOTE — Progress Notes (Signed)
Gynecologic Oncology  Patient alert, oriented, in no acute distress resting in bed. Husband at the bedside. The husband states she has had a bowel movement today and for the past 4 days. Starting yesterday, she started feeling better and has been able to tolerated chicken broth and a lemon italian ice with no nausea or emesis. She reports increased appetite. No nausea or emesis today.   Attempted 3 times to contact patient's daughter during the conversation with Dr. Berline Lopes. Dr. Berline Lopes discussed with the patient and her husband her recommendations for efforts to improve nutritional status, continue with chemotherapy if able to tolerate with consideration of possible debulking surgery after another 2-3 cycles if her nutritional status/albumin has improved and she is medically stable for surgery. Patient and husband verbalize understanding. All questions answered.

## 2020-09-12 NOTE — Progress Notes (Signed)
PROGRESS NOTE    [name]  AYT:[id number] DOB: 20-Dec-1940 DOA: 09/09/2020 PCP: [name], MD   Chief Complaint  Patient presents with  . Emesis  Brief Narrative: 80 year old female with history of ovarian cancer on chemotherapy, malignant ascites status post paracentesis 3/8, PMR, A. fib not on anticoagulation, history of cholecystectomy appendicectomy, hyperlipidemia admitted with intractable nausea vomiting and abdominal pain x2 days.  She was seen 2 days prior to admission in the ED and was discharged on pain medication and nausea medication.  She was then seen by her oncologist Dr Alvy Bimler in the ED and there was concern for early SBO and recommended hospital admission. In the ED patient is afebrile hypertensive blood work showed alk phos 239 lipase 27 AST 85 ALT 201 total bili 0.9 lactic acid normal mild leukopenia 2.0 COVID-19 pending-CT abdomen pelvis with IV contrast without oral contrast (09/08/2020)-suspect ileus involving proximal mid jejunum with no evidence of high-grade obstruction, moderate ascites and other stable findings.  Chest and abdomen XR-nonobstructive bowel gas pattern.  Patient received Zofran, 1 L NS bolus, Compazine.  S/P abdomen paracentesis 3/15 W/ 2.0 LITER FLUID REMOVAL CT 3/16- " Small bowel obstruction with a transition zone seen within the posterolateral aspect of the left lower quadrant.Large, stable calcified pelvic mass consistent with the patient's known history of ovarian cancer. Mild-to-moderate amount of free fluid within the abdomen and pelvis"  Subjective: Briefly seen in the morning oncology was in the room and I revisited her later in the afternoon Overnight CT abdomen showed small bowel obstruction-NG tube was attempted but patient could not tolerate and she refused. X-ray this morning shows contrast in the colon Dr. Alvy Bimler in the room with conference call with patient's daughters today am  Assessment & Plan: Intractable nausea  vomiting CT scan 3/13 suspect ileus without high-grade obstruction SBO in CT abdomen w/ contrast 3/16 SBO in CT scan abdomen last night-NG tube was attempted but patient could not tolerate and she refused. This morning extensive discussion by her oncologist with patient-her husband and 2 daughters.  Patient wants to try round-the-clock nausea medication and p.o. challenge and GYN surgery Dr. Berline Lopes been consulted to offer opinion regarding surgery.  X-ray this morning shows contrast in the colon and I informed oncology and patient.appreciate oncology coordinating the care and updating the family.  I did discuss with general surgery team- but will defer to GYN Onc Surgery/oncology team for further plan.  When I saw her this afternoon patient was passing gas feels somewhat bloated but tolerated liquid breakfast and about to have her lunch   Ovarian cancer on chemotherapy Malignant ascites recent paracentesis 3/8 S/p abdominal paracentesis 3/15 with 2.9 L fluid removal.  No significant abdominal pain present-although it does appear full and mildly distended could be from SBO.  Stable calcified pelvic mass seen in the CT again  Abnormal LFTs History of cholecystectomy Liver function remains stable and improving we will continue to monitor.   Recent Labs  Lab 09/07/20 2310 09/09/20 1052 09/10/20 0536 09/11/20 0752 09/12/20 1000  AST 19 85* 42* 34 39  ALT 22 201* 124* 90* 82*  ALKPHOS 70 239* 164* 128* 114  BILITOT 0.4 0.9 1.1 0.9 1.0  PROT 6.3* 6.6 5.4* 5.3* 5.1*  ALBUMIN 3.3* 3.4* 2.9* 2.7* 2.7*   Mild pancytopenia with leukopenia likely from chemotherapy: Oncology on board. WBC count improved.  Hemoglobin is stable and platelets are stable. Recent Labs  Lab 09/10/20 0536 09/11/20 0752 09/12/20 1000  HGB 10.5*  10.7* 11.2*  HCT 31.8* 33.0* 33.3*  WBC 2.5* 3.2* 4.1  PLT 227 204 212   A. fib currently in sinus not on anticoagulation at baseline  Generalized deconditioning/weakness  patient appears to be ambulating.  Encourage PT OT  Failure to thrive/deconditioning, continue Decadron.  N.p.o. for now  Goals of care full code, appreciate oncology follow-up on board. As per discussion recommendation trying for around-the-clock Compazine IV for nausea control liquid diet, calorie count and Dr. Berline Lopes to review surgical option.  Diet Order            Diet full liquid Room service appropriate? Yes; Fluid consistency: Thin  Diet effective now               Patient's Body mass index is 23.62 kg/m. DVT prophylaxis: enoxaparin (LOVENOX) injection 40 mg Start: 09/09/20 2200 Code Status:   Code Status: Full Code  Family Communication: Discussed with patient and husband at the bedside. Oncology has been having conference call with family and updating regularly. I discussed with Dr Alvy Bimler this morning.   Status is: Inpatient Remains inpatient appropriate because:Ongoing diagnostic testing needed not appropriate for outpatient work up, Unsafe d/c plan and Inpatient level of care appropriate due to severity of illness  Dispo: The patient is from: Home              Anticipated d/c is to: Home 3-4 days              Patient currently is not medically stable to d/c.   Difficult to place patient No   Unresulted Labs (From admission, onward)          Start     Ordered   09/10/20 0500  CBC  Daily,   R      09/09/20 1707   09/10/20 0500  Comprehensive metabolic panel  Daily,   R      09/09/20 1707        Medications reviewed:  Scheduled Meds: . [START ON 09/13/2020] dexamethasone (DECADRON) injection  4 mg Intravenous Q24H  . enoxaparin (LOVENOX) injection  40 mg Subcutaneous Q24H  . feeding supplement  237 mL Oral TID BM  . prochlorperazine  10 mg Intravenous Q6H  . sodium chloride flush  3 mL Intravenous Q12H   Continuous Infusions: . ondansetron (ZOFRAN) IV 8 mg (09/10/20 1737)    Consultants:see note  Procedures:see note  Antimicrobials: Anti-infectives  (From admission, onward)   None     Culture/Microbiology    Component Value Date/Time   SDES  09/09/2020 1148    BLOOD LEFT ANTECUBITAL Performed at [facility], [place] [address], [place], [place] [address]    [place]  09/09/2020 1148    BOTTLES DRAWN AEROBIC AND ANAEROBIC Blood Culture adequate volume Performed at [place] [address], [place], [place] [address]    CULT  09/09/2020 1148    NO GROWTH 3 DAYS Performed at [facility], [place] [address] [place], [place] [address]    REPTSTATUS PENDING 09/09/2020 1148    Other culture-see note  Objective: Vitals: Today's Vitals   09/11/20 1422 09/11/20 1950 09/11/20 2004 09/12/20 0356  BP: (!) 155/89 (!) 159/79  (!) 163/76  Pulse: 97 87  74  Resp: _0 Temp: 98.9 F (37.2 C) 98 F (36.7 C)  98 F (36.7 C)  TempSrc: Oral Oral  Oral  SpO2: 99% 98%  97%  Weight:      Height:  PainSc:   0-No pain     Intake/Output Summary (Last 24 hours) at 09/12/2020 1109 Last data filed at 09/12/2020 0301 Gross per 24 hour  Intake 872.48 ml  Output 150 ml  Net 722.48 ml   Filed Weights   09/09/20 1039  Weight: 56.7 kg   Weight change:   Intake/Output from previous day: 03/16 0701 - 03/17 0700 In: 872.5 [I.V.:872.5] Out: 150 [Urine:150] Intake/Output this shift: No intake/output data recorded. Filed Weights   09/09/20 1039  Weight: 56.7 kg    Examination: General exam: AAOx3,NAD, weak appearing. HEENT:Oral mucosa moist, Ear/Nose WNL grossly, dentition normal. Respiratory system: bilaterally diminishedd,no wheezing or crackles,no use of accessory muscle Cardiovascular system: S1 & S2 +, No JVD,. Gastrointestinal system: Abdomen soft, mild tenderness appears full mildly distended, bowel sounds present Nervous System:Alert, awake, moving extremities and grossly nonfocal Extremities: No edema, distal peripheral pulses palpable.  Skin: No rashes,no icterus. MSK:  Normal muscle bulk,tone, power  Data Reviewed: I have personally reviewed following labs and imaging studies CBC: Recent Labs  Lab 09/07/20 2318 09/09/20 1052 09/10/20 0536 09/11/20 0752 09/12/20 1000  WBC 3.5* 2.0* 2.5* 3.2* 4.1  NEUTROABS 1.8 1.2*  --   --   --   HGB 11.1* 11.9* 10.5* 10.7* 11.2*  HCT 34.3* 36.2 31.8* 33.0* 33.3*  MCV 87.9 85.4 86.9 88.2 87.2  PLT 277 268 227 204 314   Basic Metabolic Panel: Recent Labs  Lab 09/07/20 2310 09/09/20 1052 09/10/20 0536 09/11/20 0752 09/12/20 1000  NA 137 139 137 138 134*  K 4.1 3.8 3.6 3.6 3.8  CL 101 99 99 100 101  CO2 _0 20*  GLUCOSE 110* 144* 122* 101* 78  BUN 36* 43* 38* 36* 35*  CREATININE 0.83 1.02* 0.98 0.98 0.69  CALCIUM 8.8* 8.9 8.3* 8.6* 8.0*   GFR: Estimated Creatinine Clearance: 41.6 mL/min (by C-G formula based on SCr of 0.69 mg/dL). Liver Function Tests: Recent Labs  Lab 09/07/20 2310 09/09/20 1052 09/10/20 0536 09/11/20 0752 09/12/20 1000  AST 19 85* 42* 34 39  ALT 22 201* 124* 90* 82*  ALKPHOS 70 239* 164* 128* 114  BILITOT 0.4 0.9 1.1 0.9 1.0  PROT 6.3* 6.6 5.4* 5.3* 5.1*  ALBUMIN 3.3* 3.4* 2.9* 2.7* 2.7*   Recent Labs  Lab 09/07/20 2310 09/09/20 1052  LIPASE 56* 27   No results for input(s): AMMONIA in the last 168 hours. Coagulation Profile: No results for input(s): INR, PROTIME in the last 168 hours. Cardiac Enzymes: No results for input(s): CKTOTAL, CKMB, CKMBINDEX, TROPONINI in the last 168 hours. BNP (last 3 results) No results for input(s): PROBNP in the last 8760 hours. HbA1C: No results for input(s): HGBA1C in the last 72 hours. CBG: No results for input(s): GLUCAP in the last 168 hours. Lipid Profile: No results for input(s): CHOL, HDL, LDLCALC, TRIG, CHOLHDL, LDLDIRECT in the last 72 hours. Thyroid Function Tests: No results for input(s): TSH, T4TOTAL, FREET4, T3FREE, THYROIDAB in the last 72 hours. Anemia Panel: No results for input(s): VITAMINB12, FOLATE,  FERRITIN, TIBC, IRON, RETICCTPCT in the last 72 hours. Sepsis Labs: Recent Labs  Lab 09/09/20 1143  LATICACIDVEN 1.2    Recent Results (from the past 240 hour(s))  Blood culture (routine x 2)     Status: None (Preliminary result)   Collection Time: 09/09/20 11:43 AM   Specimen: BLOOD  Result Value Ref Range Status   Specimen Description   Final    BLOOD BLOOD RIGHT FOREARM Performed at  [facility], [place] [address] [place], [place] [address]    Special Requests   Final    BOTTLES DRAWN AEROBIC AND ANAEROBIC Blood Culture adequate volume Performed at [facility] [address], [place], [place] [address]    Culture   Final    NO GROWTH 3 DAYS Performed at [facility], [place] [address] [place], [place] [address]    Report Status PENDING  Incomplete  Blood culture (routine x 2)     Status: None (Preliminary result)   Collection Time: 09/09/20 11:48 AM   Specimen: BLOOD  Result Value Ref Range Status   Specimen Description   Final    BLOOD LEFT ANTECUBITAL Performed at [place] [address], [place], [place] [address]    Special Requests   Final    BOTTLES DRAWN AEROBIC AND ANAEROBIC Blood Culture adequate volume Performed at [facility] [address] [place], [place] [address]    Culture   Final    NO GROWTH 3 DAYS Performed at [facility], [place] [address], [place], [place] [address]    Report Status PENDING  Incomplete  Resp Panel by RT-PCR (Flu A&B, Covid) Nasopharyngeal Swab     Status: None   Collection Time: 09/09/20  3:22 PM   Specimen: Nasopharyngeal Swab; Nasopharyngeal(NP) swabs in vial transport medium  Result Value Ref Range Status   SARS Coronavirus 2 by RT PCR NEGATIVE NEGATIVE Final    Comment: (NOTE) SARS-CoV-2 target nucleic acids are NOT DETECTED.  The SARS-CoV-2 RNA is generally detectable in upper respiratory specimens during the acute phase of  infection. The lowest concentration of SARS-CoV-2 viral copies this assay can detect is 138 copies/mL. A negative result does not preclude SARS-Cov-2 infection and should not be used as the sole basis for treatment or other patient management decisions. A negative result may occur with  improper specimen collection/handling, submission of specimen other than nasopharyngeal swab, presence of viral mutation(s) within the areas targeted by this assay, and inadequate number of viral copies(<138 copies/mL). A negative result must be combined with clinical observations, patient history, and epidemiological information. The expected result is Negative.  Fact Sheet for Patients:  EntrepreneurPulse.com.au  Fact Sheet for Healthcare Providers:  IncredibleEmployment.be  This test is no t yet approved or cleared by the [place] FDA and  has been authorized for detection and/or diagnosis of SARS-CoV-2 by FDA under an Emergency Use Authorization (EUA). This EUA will remain  in effect (meaning this test can be used) for the duration of the COVID-19 declaration under Section 564(b)(1) of the Act, 21 U.S.C.section 360bbb-3(b)(1), unless the authorization is terminated  or revoked sooner.       Influenza A by PCR NEGATIVE NEGATIVE Final   Influenza B by PCR NEGATIVE NEGATIVE Final    Comment: (NOTE) The Xpert Xpress SARS-CoV-2/FLU/RSV plus assay is intended as an aid in the diagnosis of influenza from Nasopharyngeal swab specimens and should not be used as a sole basis for treatment. Nasal washings and aspirates are unacceptable for Xpert Xpress SARS-CoV-2/FLU/RSV testing.  Fact Sheet for Patients: EntrepreneurPulse.com.au  Fact Sheet for Healthcare Providers: IncredibleEmployment.be  This test is not yet approved or cleared by the [place] FDA and has been authorized for detection and/or diagnosis of SARS-CoV-2  by FDA under an Emergency Use Authorization (EUA). This EUA will remain in effect (meaning this test can be used) for the duration of the COVID-19 declaration under  Section 564(b)(1) of the Act, 21 U.S.C. section 360bbb-3(b)(1), unless the authorization is terminated or revoked.  Performed at [facility], [place] [address], [place], [place] [address]      Radiology Studies: CT ABDOMEN PELVIS W CONTRAST  Result Date: 09/11/2020 CLINICAL DATA:  Nausea and vomiting. EXAM: CT ABDOMEN AND PELVIS WITH CONTRAST TECHNIQUE: Multidetector CT imaging of the abdomen and pelvis was performed using the standard protocol following bolus administration of intravenous contrast. CONTRAST:  12m OMNIPAQUE IOHEXOL 300 MG/ML  SOLN COMPARISON:  September 08, 2020 FINDINGS: Lower chest: No acute abnormality. Hepatobiliary: Small, stable cysts are seen within the anterior and posterior aspect of the right lobe of the liver. Status post cholecystectomy. No biliary dilatation. Pancreas: Unremarkable. No pancreatic ductal dilatation or surrounding inflammatory changes. Spleen: A small, stable area of parenchymal low attenuation is seen within the posterior aspect of the spleen. Adrenals/Urinary Tract: Adrenal glands are unremarkable. Kidneys are normal, without renal calculi or hydronephrosis. A 3.3 cm simple cyst is seen within the upper pole of the left kidney. Urinary bladder is partially contracted and subsequently limited in evaluation. Stomach/Bowel: There is a small hiatal hernia. The appendix is surgically absent. Multiple dilated small bowel loops are seen throughout the abdomen and pelvis (maximum small bowel diameter of approximately 4.6 cm. A transition zone is seen within the posterolateral aspect of the left lower quadrant (axial CT images 48 through 54, CT series number 2). Noninflamed diverticula are seen within the sigmoid colon. Vascular/Lymphatic: No significant vascular findings are present. No  enlarged abdominal or pelvic lymph nodes. Reproductive: A large, stable, approximately 10.3 cm x 14.0 cm calcified mass is seen within the pelvis. An adjacent, stable 6.3 cm x 4.1 cm partially calcified cystic mass is seen along the expected region of the right adnexa. Other: No abdominal wall hernia or abnormality. There is a mild-to-moderate amount of free fluid noted within the abdomen. A small amount of pelvic free fluid is also seen. Musculoskeletal: A total right hip replacement is seen with marked severity streak artifact. Approximately 5 mm anterolisthesis of the L5 vertebral body is noted on S1. IMPRESSION: 1. Small bowel obstruction with a transition zone seen within the posterolateral aspect of the left lower quadrant. 2. Large, stable calcified pelvic mass consistent with the patient's known history of ovarian cancer. 3. Mild-to-moderate amount of free fluid within the abdomen and pelvis. 4. Stable hepatic and left renal cysts. 5. Total right hip replacement. 6. Approximately 5 mm anterolisthesis of the L5 vertebral body on S1. Electronically Signed   By: TVirgina NorfolkM.D.   On: 09/11/2020 20:36   [place]acentesis  Result Date: 09/10/2020 INDICATION: Patient with a history of ovarian cancer and recurrent malignant ascites. Interventional radiology asked to perform a therapeutic paracentesis. EXAM: ULTRASOUND GUIDED PARACENTESIS MEDICATIONS: 1% lidocaine 10 mL COMPLICATIONS: None immediate. PROCEDURE: Informed written consent was obtained from the patient after a discussion of the risks, benefits and alternatives to treatment. A timeout was performed prior to the initiation of the procedure. Initial ultrasound scanning demonstrates a large amount of ascites within the right lower abdominal quadrant. The right lower abdomen was prepped and draped in the usual sterile fashion. 1% lidocaine was used for local anesthesia. Following this, a 19 gauge, 7-cm, Yueh catheter was introduced. An ultrasound  image was saved for documentation purposes. The paracentesis was performed. The catheter was removed and a dressing was applied. The patient tolerated the procedure well without immediate post procedural complication. FINDINGS: A total  of approximately 2.9 L of clear yellow fluid was removed. IMPRESSION: Successful ultrasound-guided paracentesis yielding 2.9 liters of peritoneal fluid. Read by: Soyla Dryer, NP Electronically Signed   By: Sandi Mariscal M.D.   On: 09/10/2020 16:28   DG Abd Portable 1V-Small Bowel Obstruction Protocol-24 hr delay  Result Date: 09/12/2020 CLINICAL DATA:  Small-bowel obstruction EXAM: PORTABLE ABDOMEN - 1 VIEW COMPARISON:  09/11/2020, 09/09/2020 FINDINGS: There are a few dilated loops of small bowel within the abdomen measuring up to 3.8 cm in diameter, decreased from prior. Oral contrast from previous day CT has progressed into the colon. No complete obstruction. No gross free intraperitoneal air. Large calcified pelvic mass. Excreted contrast present within the bladder. IMPRESSION: Decreasing distension of small bowel. Contrast has progressed to the colon. Findings suggestive of resolving small bowel obstruction. Electronically Signed   By: Davina Poke D.O.   On: 09/12/2020 08:11     LOS: 3 days   Antonieta Pert, MD [facility]  09/12/2020, 11:09 AM

## 2020-09-12 NOTE — Care Management Important Message (Signed)
Important Message  Patient Details IM Letter given to the Patient. Name: [name] MRN: [id number] Date of Birth: [birth year]   Medicare Important Message Given:  Yes     Kerin Salen 09/12/2020, 10:19 AM

## 2020-09-12 NOTE — Progress Notes (Signed)
Calorie Count Note  48 hour calorie count ordered. Please document intakes in flowsheets.  Will order Ensure supplements to provide additional protein to full liquid diet.   Day 1 results to follow.  Clayton Bibles, MS, RD, LDN Inpatient Clinical Dietitian Contact information available via Amion

## 2020-09-12 NOTE — Progress Notes (Signed)
[name]   DOB:[birth year]   GH#:[id number]    ASSESSMENT & PLAN:   Ovarian cancer I told the patient and family that she has primary refractory ovarian cancer Despite good control of pleural effusion, she has persistent recurrent ascites and no signs of subacute bowel obstruction In my opinion, her situation is terminal I do not believe she is a surgical candidate but to give her the best benefit of the doubt, I will get Dr. Berline Lopes to see her today if possible to offer her opinion from surgical standpoint I explained to the patient and family she is not a candidate for further systemic chemotherapy unless she has adequate calorie intake and reasonable bowel function which I am not seeing at this point The patient and her husband rejected the idea that her condition is terminal They want to try to eat improved to me that the reason why she is doing so poorly is because we kept her n.p.o. I will restart her diet per patient request  Uncontrolled nausea, mild dilated loops of bowel on CT, consistent with subacute bowel obstruction I tried to explain to the patient and her husband to the best of my ability the rationale behind NG tube placement to alleviate symptoms of nausea which is consistent with subacute bowel obstruction The patient has rejected that She would like to get round-the-clock antiemetics intravenously so that she could eat I explained to the patient and her husband her subacute bowel obstruction is the cause of her symptom, her inability to tolerate adequate intake and her lack of appetite It appears somewhat difficult for me to make them understand Overall, they felt that they were not given adequate support to prove that she can recover on her own As above, I plan to get Dr. Berline Lopes to come by to offer her an opinion in regards to whether surgery is feasible  Uncontrolled nausea Due to her intra-abdominal disease She felt that if I gave her scheduled antiemetics, she  would be able to function normally Repeatedly, she requested to be discharged so that she can get similar IV medicine round-the-clock at home I told her this is not possible The only other nonhospital setting that she can get schedule IV antiemetics is in the setting of palliative care with residential hospice which the patient is not ready to accept I reviewed the risk and benefits of Zofran versus Compazine The patient elected to get round-the-clock IV Compazine and will try to eat I will start her on liquid diet and we will initiate calorie count If she can tolerate full liquid diet today, we will advance to soft diet tomorrow  Malignant ascites Surprisingly, only 2.9 L of fluid was removed recently Observe for now I do not recommend repeat paracentesis I have discontinued IV fluids today  Elevated LFTs This is highly unusual 2 weeks out from recent chemotherapy Observe closely for now It is trending down  Mild pancytopenia Due to recent treatment She does not need G-CSF support It is improving  Goals of care Resolution of her bowel symptoms and able to eat without nausea and vomiting I made it very clear to the patient and her husband I do not advise her to leave until she can eat a normal meal without nausea and vomiting I reviewed the risk and benefits of palliative care consult We also discussed second opinion elsewhere After a very prolonged significant discussion, she agreed to the following plan: 1) Dr. Berline Lopes to review surgical option 2) round-the-clock Compazine intravenous  for nausea control 3) start her on liquid diet with calorie count  Discharge planning She will likely be here for 2 to 3 days I spent close to 1 hour today on reviewing plan of care and discuss plan of care with multiple team members If there are immediate needs, she needs to consult with nursing staff that is assigned to her in the hospital and communicate with primary service I also  explained to them multiple times it is inappropriate to leave [place]   At the end of today's visit, I am comforted the patient will stay with plan as outlined above  Overall, I spent roughly 70 minutes on reviewing plan of care with family, patient and primary service All questions were answered. The patient knows to call the clinic with any problems, questions or concerns.   [name], MD 09/12/2020 9:29 AM  Subjective:  Overnight events and report from nursing staff were noted. I have reviewed imaging studies with the patient and her husband and her 2 daughters are available to ask questions through conference call over the phone This morning, she felt less nauseated Her husband repeatedly felt hopeful that yesterday she actually felt hungry She has rejected NG tube placement despite the rationale behind placement of NG tube Overall, she repeatedly complained that she did not get great care in the hospital; she said that the bed is not comfortable.  The room is not comfortable.  She "did not ask" to have NG tube yesterday Repeatedly, she requested to be discharged so that she could get similar care but in her home environment which is not possible She is not in pain  Objective:  Vitals:   09/11/20 1950 09/12/20 0356  BP: (!) 159/79 (!) 163/76  Pulse: 87 74  Resp: 16 18  Temp: 98 F (36.7 C) 98 F (36.7 C)  SpO2: 98% 97%     Intake/Output Summary (Last 24 hours) at 09/12/2020 0929 Last data filed at 09/12/2020 6967 Gross per 24 hour  Intake 872.48 ml  Output 150 ml  Net 722.48 ml    GENERAL:alert,  NEURO: alert & oriented x 3 with fluent speech, no focal motor/sensory deficits   Labs:  Recent Labs    09/09/20 1052 09/10/20 0536 09/11/20 0752  NA 139 137 138  K 3.8 3.6 3.6  CL 99 99 100  CO2 27 25 22   GLUCOSE 144* 122* 101*  BUN 43* 38* 36*  CREATININE 1.02* 0.98 0.98  CALCIUM 8.9 8.3* 8.6*  GFRNONAA 55* 58* 58*  PROT 6.6 5.4* 5.3*  ALBUMIN  3.4* 2.9* 2.7*  AST 85* 42* 34  ALT 201* 124* 90*  ALKPHOS 239* 164* 128*  BILITOT 0.9 1.1 0.9    Studies: I have reviewed imaging studies with the patient and her husband CT ABDOMEN PELVIS W CONTRAST  Result Date: 09/11/2020 CLINICAL DATA:  Nausea and vomiting. EXAM: CT ABDOMEN AND PELVIS WITH CONTRAST TECHNIQUE: Multidetector CT imaging of the abdomen and pelvis was performed using the standard protocol following bolus administration of intravenous contrast. CONTRAST:  141mL OMNIPAQUE IOHEXOL 300 MG/ML  SOLN COMPARISON:  September 08, 2020 FINDINGS: Lower chest: No acute abnormality. Hepatobiliary: Small, stable cysts are seen within the anterior and posterior aspect of the right lobe of the liver. Status post cholecystectomy. No biliary dilatation. Pancreas: Unremarkable. No pancreatic ductal dilatation or surrounding inflammatory changes. Spleen: A small, stable area of parenchymal low attenuation is seen within the posterior aspect of the spleen. Adrenals/Urinary Tract: Adrenal glands  are unremarkable. Kidneys are normal, without renal calculi or hydronephrosis. A 3.3 cm simple cyst is seen within the upper pole of the left kidney. Urinary bladder is partially contracted and subsequently limited in evaluation. Stomach/Bowel: There is a small hiatal hernia. The appendix is surgically absent. Multiple dilated small bowel loops are seen throughout the abdomen and pelvis (maximum small bowel diameter of approximately 4.6 cm. A transition zone is seen within the posterolateral aspect of the left lower quadrant (axial CT images 48 through 54, CT series number 2). Noninflamed diverticula are seen within the sigmoid colon. Vascular/Lymphatic: No significant vascular findings are present. No enlarged abdominal or pelvic lymph nodes. Reproductive: A large, stable, approximately 10.3 cm x 14.0 cm calcified mass is seen within the pelvis. An adjacent, stable 6.3 cm x 4.1 cm partially calcified cystic mass is seen  along the expected region of the right adnexa. Other: No abdominal wall hernia or abnormality. There is a mild-to-moderate amount of free fluid noted within the abdomen. A small amount of pelvic free fluid is also seen. Musculoskeletal: A total right hip replacement is seen with marked severity streak artifact. Approximately 5 mm anterolisthesis of the L5 vertebral body is noted on S1. IMPRESSION: 1. Small bowel obstruction with a transition zone seen within the posterolateral aspect of the left lower quadrant. 2. Large, stable calcified pelvic mass consistent with the patient's known history of ovarian cancer. 3. Mild-to-moderate amount of free fluid within the abdomen and pelvis. 4. Stable hepatic and left renal cysts. 5. Total right hip replacement. 6. Approximately 5 mm anterolisthesis of the L5 vertebral body on S1. Electronically Signed   By: Virgina Norfolk M.D.   On: 09/11/2020 20:36   CT ABDOMEN PELVIS W CONTRAST  Result Date: 09/08/2020 CLINICAL DATA:  Right upper quadrant swelling, intermittent pain, bearing cancer with malignant ascites EXAM: CT ABDOMEN AND PELVIS WITH CONTRAST TECHNIQUE: Multidetector CT imaging of the abdomen and pelvis was performed using the standard protocol following bolus administration of intravenous contrast. CONTRAST:  153mL OMNIPAQUE IOHEXOL 300 MG/ML  SOLN COMPARISON:  07/22/2020 FINDINGS: Lower chest: No acute pleural or parenchymal lung disease. Calcified paraesophageal nodules measuring up to 6 mm unchanged, consistent with known metastatic ovarian cancer. Hepatobiliary: Stable indeterminate subcentimeter liver hypodensities. No intrahepatic biliary duct dilation. Gallbladder surgically absent. Pancreas: Unremarkable. No pancreatic ductal dilatation or surrounding inflammatory changes. Spleen: Stable indeterminate hypodensity posterior subcapsular region of the spleen, likely a small hemangioma. Adrenals/Urinary Tract: Stable left renal cyst. The kidneys enhance  normally and symmetrically without obstructive uropathy. No urinary tract calculi. The adrenals are unremarkable. The bladder is decompressed, limiting its evaluation. Stomach/Bowel: Fluid-filled mildly dilated loops of jejunum within the left mid abdomen measure up to 2.4 cm in size, nonspecific. There are some scattered gas fluid levels consistent with regional ileus. Distal small bowel is relatively decompressed. There is gas and stool throughout the colon to the level of the rectum. Vascular/Lymphatic: Mild atherosclerosis unchanged. Multiple calcified retroperitoneal nodules consistent with metastatic disease. No new adenopathy. Reproductive: Enlarged calcified mass arising from the right adnexa measuring approximately 14 x 15 cm, not significantly changed since prior study. Calcified peritoneal masses within the central mesentery are again noted, without significant change since prior exam. Largest calcified mass within the central abdomen image 40/2 measures 3.4 x 2.4 cm, previous having measured approximately 3.7 x 2.1 cm. Findings are consistent with known history of metastatic ovarian cancer. Other: There is moderate ascites throughout the abdomen and pelvis, decreased since previous CT scan.  No free intraperitoneal gas. No abdominal wall hernia. Musculoskeletal: Unremarkable right hip arthroplasty. No acute or destructive bony lesions. Stable intramuscular lipoma left lateral abdominal wall. Reconstructed images demonstrate no additional findings. IMPRESSION: 1. Large calcified pelvic mass arising from the right adnexa, consistent with known history of ovarian cancer. 2. Stable calcified metastatic implants throughout the mesentery. 3. Stable calcified nodules within the retroperitoneum, epiphrenic region, and paraesophageal regions, consistent with metastatic disease. 4. Moderate ascites. 5. Likely ileus involving the proximal to mid jejunum. No evidence of high-grade obstruction. Electronically Signed    By: Randa Ngo M.D.   On: 09/08/2020 03:59   US Paracentesis  Result Date: 09/10/2020 INDICATION: Patient with a history of ovarian cancer and recurrent malignant ascites. Interventional radiology asked to perform a therapeutic paracentesis. EXAM: ULTRASOUND GUIDED PARACENTESIS MEDICATIONS: 1% lidocaine 10 mL COMPLICATIONS: None immediate. PROCEDURE: Informed written consent was obtained from the patient after a discussion of the risks, benefits and alternatives to treatment. A timeout was performed prior to the initiation of the procedure. Initial ultrasound scanning demonstrates a large amount of ascites within the right lower abdominal quadrant. The right lower abdomen was prepped and draped in the usual sterile fashion. 1% lidocaine was used for local anesthesia. Following this, a 19 gauge, 7-cm, Yueh catheter was introduced. An ultrasound image was saved for documentation purposes. The paracentesis was performed. The catheter was removed and a dressing was applied. The patient tolerated the procedure well without immediate post procedural complication. FINDINGS: A total of approximately 2.9 L of clear yellow fluid was removed. IMPRESSION: Successful ultrasound-guided paracentesis yielding 2.9 liters of peritoneal fluid. Read by: Soyla Dryer, NP Electronically Signed   By: Sandi Mariscal M.D.   On: 09/10/2020 16:28   US Paracentesis  Result Date: 09/03/2020 INDICATION: Patient with history of ovarian cancer with recurrent malignant ascites. Request received for therapeutic paracentesis up to 5 liters. EXAM: ULTRASOUND GUIDED THERAPEUTIC PARACENTESIS MEDICATIONS: 1% lidocaine to skin and subcutaneous tissue COMPLICATIONS: None immediate. PROCEDURE: Informed written consent was obtained from the patient after a discussion of the risks, benefits and alternatives to treatment. A timeout was performed prior to the initiation of the procedure. Initial ultrasound scanning demonstrates a large amount of  ascites within the left lower abdominal quadrant. The left lower abdomen was prepped and draped in the usual sterile fashion. 1% lidocaine was used for local anesthesia. Following this, a 19 gauge, 10-cm, Yueh catheter was introduced. An ultrasound image was saved for documentation purposes. The paracentesis was performed. The catheter was removed and a dressing was applied. The patient tolerated the procedure well without immediate post procedural complication. FINDINGS: A total of approximately 5 liters of yellow fluid was removed. IMPRESSION: Successful ultrasound-guided therapeutic paracentesis yielding 5 liters of peritoneal fluid. Read by: Rowe Robert, PA-C Electronically Signed   By: Jacqulynn Cadet M.D.   On: 09/03/2020 15:28   DG Abdomen Acute W/Chest  Result Date: 09/09/2020 CLINICAL DATA:  Emesis EXAM: DG ABDOMEN ACUTE WITH 1 VIEW CHEST COMPARISON:  None. FINDINGS: Suboptimal visualization due to body habitus. There are no significantly dilated loops identified. No free air. No air-fluid levels. Cholecystectomy clips. No consolidation or edema. No pleural effusion. Heart size is normal for technique. Right shoulder arthroplasty. Advanced left glenohumeral osteoarthritis. Right hip arthroplasty. IMPRESSION: Nonobstructive bowel gas pattern. Electronically Signed   By: Macy Mis M.D.   On: 09/09/2020 13:11   DG Abd Portable 1V-Small Bowel Obstruction Protocol-24 hr delay  Result Date: 09/12/2020 CLINICAL DATA:  Small-bowel obstruction  EXAM: PORTABLE ABDOMEN - 1 VIEW COMPARISON:  09/11/2020, 09/09/2020 FINDINGS: There are a few dilated loops of small bowel within the abdomen measuring up to 3.8 cm in diameter, decreased from prior. Oral contrast from previous day CT has progressed into the colon. No complete obstruction. No gross free intraperitoneal air. Large calcified pelvic mass. Excreted contrast present within the bladder. IMPRESSION: Decreasing distension of small bowel. Contrast has  progressed to the colon. Findings suggestive of resolving small bowel obstruction. Electronically Signed   By: Davina Poke D.O.   On: 09/12/2020 08:11

## 2020-09-13 ENCOUNTER — Inpatient Hospital Stay (HOSPITAL_COMMUNITY): Payer: Medicare Other

## 2020-09-13 DIAGNOSIS — M79601 Pain in right arm: Secondary | ICD-10-CM

## 2020-09-13 DIAGNOSIS — L538 Other specified erythematous conditions: Secondary | ICD-10-CM

## 2020-09-13 DIAGNOSIS — Z7189 Other specified counseling: Secondary | ICD-10-CM

## 2020-09-13 DIAGNOSIS — M7989 Other specified soft tissue disorders: Secondary | ICD-10-CM

## 2020-09-13 LAB — COMPREHENSIVE METABOLIC PANEL
ALT: 91 U/L — ABNORMAL HIGH (ref 0–44)
AST: 46 U/L — ABNORMAL HIGH (ref 15–41)
Albumin: 2.9 g/dL — ABNORMAL LOW (ref 3.5–5.0)
Alkaline Phosphatase: 120 U/L (ref 38–126)
Anion gap: 9 (ref 5–15)
BUN: 33 mg/dL — ABNORMAL HIGH (ref 8–23)
CO2: 26 mmol/L (ref 22–32)
Calcium: 8.4 mg/dL — ABNORMAL LOW (ref 8.9–10.3)
Chloride: 100 mmol/L (ref 98–111)
Creatinine, Ser: 0.78 mg/dL (ref 0.44–1.00)
GFR, Estimated: >60 mL/min (ref >60)
Glucose, Bld: 116 mg/dL — ABNORMAL HIGH (ref 70–99)
Potassium: 4.2 mmol/L (ref 3.5–5.1)
Sodium: 135 mmol/L (ref 135–145)
Total Bilirubin: 0.7 mg/dL (ref 0.3–1.2)
Total Protein: 5.3 g/dL — ABNORMAL LOW (ref 6.5–8.1)

## 2020-09-13 LAB — CBC
HCT: 35.4 % — ABNORMAL LOW (ref 36.0–46.0)
Hemoglobin: 11.9 g/dL — ABNORMAL LOW (ref 12.0–15.0)
MCH: 28.8 pg (ref 26.0–34.0)
MCHC: 33.6 g/dL (ref 30.0–36.0)
MCV: 85.7 fL (ref 80.0–100.0)
Platelets: 243 10*3/uL (ref 150–400)
RBC: 4.13 MIL/uL (ref 3.87–5.11)
RDW: 15.9 % — ABNORMAL HIGH (ref 11.5–15.5)
WBC: 5.4 10*3/uL (ref 4.0–10.5)
nRBC: 0 % (ref 0.0–0.2)

## 2020-09-13 MED ORDER — PROCHLORPERAZINE MALEATE 10 MG PO TABS: 10.0000 mg | ORAL_TABLET | Freq: Four times a day (QID) | ORAL | Status: DC | PRN

## 2020-09-13 MED ORDER — PROCHLORPERAZINE EDISYLATE 10 MG/2ML IJ SOLN
10.0000 mg | Freq: Four times a day (QID) | INTRAMUSCULAR | Status: DC | PRN
Start: 2020-09-13 — End: 2020-09-14

## 2020-09-13 MED FILL — Fosaprepitant Dimeglumine For IV Infusion 150 MG (Base Eq): INTRAVENOUS | Qty: 5 | Status: AC

## 2020-09-13 MED FILL — Dexamethasone Sodium Phosphate Inj 100 MG/10ML: INTRAMUSCULAR | Qty: 1 | Status: AC

## 2020-09-13 NOTE — Progress Notes (Signed)
Consult for PIV restart. Upon arrival, patient was up in chair. Primary RN to reenter consult once patient back in bed.

## 2020-09-13 NOTE — Progress Notes (Signed)
Gynecologic Oncology Follow Up  80 year old with ovarian cancer s/p cycle 2 of carboplatin and taxol on 08/26/2020 currently admitted for partial small bowel obstruction that appears to be resolving. Patient alert, oriented, ambulating to the bathroom with the assist of her husband, in no distress. Patient's husband states she has tolerated 3 meals of liquids and has had no nausea or emesis. Last BM yesterday. Passing flatus and voiding without difficulty. She denies pain at this time. Husband is requesting recommendations for calorie/protein dense foods. Discussed diet advancement. Continue plan of care per Hospitalist and Dr. Alvy Bimler. All questions answered.

## 2020-09-13 NOTE — CV Procedure (Signed)
RUE venous duplex completed. Dr. Antonieta Pert in the room and given preliminary results.  Results can be found under chart review under CV PROC. 09/13/2020 4:29 PM Aracelia Brinson RVT, RDMS

## 2020-09-13 NOTE — Progress Notes (Signed)
[name]   DOB:December 25, 1940   WU#:[id number]    ASSESSMENT & PLAN:   Ovarian cancer It appears that [name] subacute bowel obstruction is resolving Assuming that she can eat well, not throwing up and retain reasonable oral intake, it is possible that she could receive further palliative chemotherapy but these will be discussed in the outpatient setting Continue supportive care  Subacute bowel obstruction This appears to be resolving Appreciate input from Dr. Berline Lopes Continue conservative observation The patient has declined NG tube placement  Uncontrolled nausea, resolving Due to [name] intra-abdominal disease After a very prolonged discussion yesterday, the patient have asked to have scheduled Compazine Today, [name] husband dispute that discussion even though it was well documented In any case, after another prolonged discussion about the risk and benefits of scheduled Compazine, she would like to try Compazine IV as needed and would be willing to try oral Compazine with a goal of going home without IV medicine support Once she is discharged, she can take Compazine or Zofran at home I have a very extensive discussion with the patient and husband what to expect once schedule IV Compazine is discontinued  Malignant ascites Surprisingly, only 2.9 L of fluid was removed recently Observe for now I do not recommend repeat paracentesis I have discontinued IV fluids today  Elevated LFTs This is highly unusual 2 weeks out from recent chemotherapy Observe closely for now It is trending down  Mild pancytopenia Due to recent treatment She does not need G-CSF support It is improving  Goals of care Resolution of [name] bowel symptoms and able to eat without nausea and vomiting I made it very clear to the patient and [name] husband I do not advise [name] to leave until she can eat a normal meal without nausea and vomiting; if she is discharged prematurely, she might end up in the emergency room again  to be admitted After a very prolonged significant discussion, she agreed to the following plan: 1)  advance diet to regular diet 2)  discontinue scheduled Compazine.  She has as needed IV Compazine or oral Compazine along with Zofran as needed 3)  discontinue dexamethasone  Recent findings of bedbugs The patient ruminate over the findings and disliked the fact that she is placed on isolation I recommend the charge nurse on the floor to discuss this with family  Visitor hour policy [name] husband would like to stay with the patient overnight; overall, I disagree with this approach I recommend he discuss with charge nurse about this policy  Discharge planning If she can tolerate oral diet without throwing up and without the need for IV antiemetic support, she can be discharged tomorrow.  I will arrange for outpatient follow-up depending on whether she is discharged this weekend  I spent close to 1 hour today on reviewing plan of care and discuss plan of care with multiple team members If there are immediate needs, she needs to consult with nursing staff that is assigned to [name] in the hospital and communicate with primary service I also explained to them multiple times it is inappropriate to leave [place]   All questions were answered. The patient knows to call the clinic with any problems, questions or concerns.   The total time spent in the appointment was 6 encounter with patients including review of chart and various tests results, discussions about plan of care and coordination of care plan  [name], MD 09/13/2020 8:52 AM  Subjective:  I saw the patient and  [name] husband in the room [name] 2 daughters were available by telephone conference She tolerated some liquid diet without throwing up She is receiving IV Compazine round-the-clock.  She complained of sedation The patient's husband disputed that he did not asked to have IV Compazine around-the-clock, despite a very  prolonged discussion yesterday The patient clarifies she wanted IV Compazine around-the-clock because she could not wait a few minutes for nurses to bring [name] medications in.  In fact, the patient had expressed strongly [name] dissatisfaction over the fact that the bed bug was found on [name] and the fact that she was placed on isolation.  She felt that she has back pain because the bed she is sleeping is not comfortable.  [name] husband has asked to stay overnight to be with [name].  She has recent bowel movement without difficulties.  She denies pain.  Objective:  Vitals:   09/12/20 2151 09/13/20 0422  BP: (!) 156/82 (!) 147/65  Pulse: 86 83  Resp: 20 17  Temp: 98.6 F (37 C) 97.8 F (36.6 C)  SpO2: 96% 93%     Intake/Output Summary (Last 24 hours) at 09/13/2020 6789 Last data filed at 09/12/2020 2258 Gross per 24 hour  Intake 242.42 ml  Output --  Net 242.42 ml    GENERAL:alert, no distress and comfortable NEURO: alert & oriented x 3 with fluent speech, no focal motor/sensory deficits   Labs:  Recent Labs    09/11/20 0752 09/12/20 1000 09/13/20 0525  NA 138 134* 135  K 3.6 3.8 4.2  CL 100 101 100  CO2 22 20* 26  GLUCOSE 101* 78 116*  BUN 36* 35* 33*  CREATININE 0.98 0.69 0.78  CALCIUM 8.6* 8.0* 8.4*  GFRNONAA 58* >60 >60  PROT 5.3* 5.1* 5.3*  ALBUMIN 2.7* 2.7* 2.9*  AST 34 39 46*  ALT 90* 82* 91*  ALKPHOS 128* 114 120  BILITOT 0.9 1.0 0.7    Studies:  CT ABDOMEN PELVIS W CONTRAST  Result Date: 09/11/2020 CLINICAL DATA:  Nausea and vomiting. EXAM: CT ABDOMEN AND PELVIS WITH CONTRAST TECHNIQUE: Multidetector CT imaging of the abdomen and pelvis was performed using the standard protocol following bolus administration of intravenous contrast. CONTRAST:  146mL OMNIPAQUE IOHEXOL 300 MG/ML  SOLN COMPARISON:  September 08, 2020 FINDINGS: Lower chest: No acute abnormality. Hepatobiliary: Small, stable cysts are seen within the anterior and posterior aspect of the right lobe of the liver.  Status post cholecystectomy. No biliary dilatation. Pancreas: Unremarkable. No pancreatic ductal dilatation or surrounding inflammatory changes. Spleen: A small, stable area of parenchymal low attenuation is seen within the posterior aspect of the spleen. Adrenals/Urinary Tract: Adrenal glands are unremarkable. Kidneys are normal, without renal calculi or hydronephrosis. A 3.3 cm simple cyst is seen within the upper pole of the left kidney. Urinary bladder is partially contracted and subsequently limited in evaluation. Stomach/Bowel: There is a small hiatal hernia. The appendix is surgically absent. Multiple dilated small bowel loops are seen throughout the abdomen and pelvis (maximum small bowel diameter of approximately 4.6 cm. A transition zone is seen within the posterolateral aspect of the left lower quadrant (axial CT images 48 through 54, CT series number 2). Noninflamed diverticula are seen within the sigmoid colon. Vascular/Lymphatic: No significant vascular findings are present. No enlarged abdominal or pelvic lymph nodes. Reproductive: A large, stable, approximately 10.3 cm x 14.0 cm calcified mass is seen within the pelvis. An adjacent, stable 6.3 cm x 4.1 cm partially calcified cystic mass is seen along  the expected region of the right adnexa. Other: No abdominal wall hernia or abnormality. There is a mild-to-moderate amount of free fluid noted within the abdomen. A small amount of pelvic free fluid is also seen. Musculoskeletal: A total right hip replacement is seen with marked severity streak artifact. Approximately 5 mm anterolisthesis of the L5 vertebral body is noted on S1. IMPRESSION: 1. Small bowel obstruction with a transition zone seen within the posterolateral aspect of the left lower quadrant. 2. Large, stable calcified pelvic mass consistent with the patient's known history of ovarian cancer. 3. Mild-to-moderate amount of free fluid within the abdomen and pelvis. 4. Stable hepatic and left  renal cysts. 5. Total right hip replacement. 6. Approximately 5 mm anterolisthesis of the L5 vertebral body on S1. Electronically Signed   By: Virgina Norfolk M.D.   On: 09/11/2020 20:36   CT ABDOMEN PELVIS W CONTRAST  Result Date: 09/08/2020 CLINICAL DATA:  Right upper quadrant swelling, intermittent pain, bearing cancer with malignant ascites EXAM: CT ABDOMEN AND PELVIS WITH CONTRAST TECHNIQUE: Multidetector CT imaging of the abdomen and pelvis was performed using the standard protocol following bolus administration of intravenous contrast. CONTRAST:  166mL OMNIPAQUE IOHEXOL 300 MG/ML  SOLN COMPARISON:  07/22/2020 FINDINGS: Lower chest: No acute pleural or parenchymal lung disease. Calcified paraesophageal nodules measuring up to 6 mm unchanged, consistent with known metastatic ovarian cancer. Hepatobiliary: Stable indeterminate subcentimeter liver hypodensities. No intrahepatic biliary duct dilation. Gallbladder surgically absent. Pancreas: Unremarkable. No pancreatic ductal dilatation or surrounding inflammatory changes. Spleen: Stable indeterminate hypodensity posterior subcapsular region of the spleen, likely a small hemangioma. Adrenals/Urinary Tract: Stable left renal cyst. The kidneys enhance normally and symmetrically without obstructive uropathy. No urinary tract calculi. The adrenals are unremarkable. The bladder is decompressed, limiting its evaluation. Stomach/Bowel: Fluid-filled mildly dilated loops of jejunum within the left mid abdomen measure up to 2.4 cm in size, nonspecific. There are some scattered gas fluid levels consistent with regional ileus. Distal small bowel is relatively decompressed. There is gas and stool throughout the colon to the level of the rectum. Vascular/Lymphatic: Mild atherosclerosis unchanged. Multiple calcified retroperitoneal nodules consistent with metastatic disease. No new adenopathy. Reproductive: Enlarged calcified mass arising from the right adnexa measuring  approximately 14 x 15 cm, not significantly changed since prior study. Calcified peritoneal masses within the central mesentery are again noted, without significant change since prior exam. Largest calcified mass within the central abdomen image 40/2 measures 3.4 x 2.4 cm, previous having measured approximately 3.7 x 2.1 cm. Findings are consistent with known history of metastatic ovarian cancer. Other: There is moderate ascites throughout the abdomen and pelvis, decreased since previous CT scan. No free intraperitoneal gas. No abdominal wall hernia. Musculoskeletal: Unremarkable right hip arthroplasty. No acute or destructive bony lesions. Stable intramuscular lipoma left lateral abdominal wall. Reconstructed images demonstrate no additional findings. IMPRESSION: 1. Large calcified pelvic mass arising from the right adnexa, consistent with known history of ovarian cancer. 2. Stable calcified metastatic implants throughout the mesentery. 3. Stable calcified nodules within the retroperitoneum, epiphrenic region, and paraesophageal regions, consistent with metastatic disease. 4. Moderate ascites. 5. Likely ileus involving the proximal to mid jejunum. No evidence of high-grade obstruction. Electronically Signed   By: Randa Ngo M.D.   On: 09/08/2020 03:59   US Paracentesis  Result Date: 09/10/2020 INDICATION: Patient with a history of ovarian cancer and recurrent malignant ascites. Interventional radiology asked to perform a therapeutic paracentesis. EXAM: ULTRASOUND GUIDED PARACENTESIS MEDICATIONS: 1% lidocaine 10 mL COMPLICATIONS: None immediate.  PROCEDURE: Informed written consent was obtained from the patient after a discussion of the risks, benefits and alternatives to treatment. A timeout was performed prior to the initiation of the procedure. Initial ultrasound scanning demonstrates a large amount of ascites within the right lower abdominal quadrant. The right lower abdomen was prepped and draped in the  usual sterile fashion. 1% lidocaine was used for local anesthesia. Following this, a 19 gauge, 7-cm, Yueh catheter was introduced. An ultrasound image was saved for documentation purposes. The paracentesis was performed. The catheter was removed and a dressing was applied. The patient tolerated the procedure well without immediate post procedural complication. FINDINGS: A total of approximately 2.9 L of clear yellow fluid was removed. IMPRESSION: Successful ultrasound-guided paracentesis yielding 2.9 liters of peritoneal fluid. Read by: Soyla Dryer, NP Electronically Signed   By: Sandi Mariscal M.D.   On: 09/10/2020 16:28   US Paracentesis  Result Date: 09/03/2020 INDICATION: Patient with history of ovarian cancer with recurrent malignant ascites. Request received for therapeutic paracentesis up to 5 liters. EXAM: ULTRASOUND GUIDED THERAPEUTIC PARACENTESIS MEDICATIONS: 1% lidocaine to skin and subcutaneous tissue COMPLICATIONS: None immediate. PROCEDURE: Informed written consent was obtained from the patient after a discussion of the risks, benefits and alternatives to treatment. A timeout was performed prior to the initiation of the procedure. Initial ultrasound scanning demonstrates a large amount of ascites within the left lower abdominal quadrant. The left lower abdomen was prepped and draped in the usual sterile fashion. 1% lidocaine was used for local anesthesia. Following this, a 19 gauge, 10-cm, Yueh catheter was introduced. An ultrasound image was saved for documentation purposes. The paracentesis was performed. The catheter was removed and a dressing was applied. The patient tolerated the procedure well without immediate post procedural complication. FINDINGS: A total of approximately 5 liters of yellow fluid was removed. IMPRESSION: Successful ultrasound-guided therapeutic paracentesis yielding 5 liters of peritoneal fluid. Read by: Rowe Robert, PA-C Electronically Signed   By: Jacqulynn Cadet M.D.    On: 09/03/2020 15:28   DG Abdomen Acute W/Chest  Result Date: 09/09/2020 CLINICAL DATA:  Emesis EXAM: DG ABDOMEN ACUTE WITH 1 VIEW CHEST COMPARISON:  None. FINDINGS: Suboptimal visualization due to body habitus. There are no significantly dilated loops identified. No free air. No air-fluid levels. Cholecystectomy clips. No consolidation or edema. No pleural effusion. Heart size is normal for technique. Right shoulder arthroplasty. Advanced left glenohumeral osteoarthritis. Right hip arthroplasty. IMPRESSION: Nonobstructive bowel gas pattern. Electronically Signed   By: Macy Mis M.D.   On: 09/09/2020 13:11   DG Abd Portable 1V-Small Bowel Obstruction Protocol-24 hr delay  Result Date: 09/12/2020 CLINICAL DATA:  Small-bowel obstruction EXAM: PORTABLE ABDOMEN - 1 VIEW COMPARISON:  09/11/2020, 09/09/2020 FINDINGS: There are a few dilated loops of small bowel within the abdomen measuring up to 3.8 cm in diameter, decreased from prior. Oral contrast from previous day CT has progressed into the colon. No complete obstruction. No gross free intraperitoneal air. Large calcified pelvic mass. Excreted contrast present within the bladder. IMPRESSION: Decreasing distension of small bowel. Contrast has progressed to the colon. Findings suggestive of resolving small bowel obstruction. Electronically Signed   By: Davina Poke D.O.   On: 09/12/2020 08:11

## 2020-09-13 NOTE — Progress Notes (Signed)
PROGRESS NOTE    [name]  XLK:[id number] DOB: 13-Aug-1940 DOA: 09/09/2020 PCP: [name], MD   Chief Complaint  Patient presents with  . Emesis  Brief Narrative: 80 year old female with history of ovarian cancer on chemotherapy, malignant ascites status post paracentesis 3/8, PMR, A. fib not on anticoagulation, history of cholecystectomy appendicectomy, hyperlipidemia admitted with intractable nausea vomiting and abdominal pain x2 days.  She was seen 2 days prior to admission in the ED and was discharged on pain medication and nausea medication.  She was then seen by her oncologist Dr Alvy Bimler in the ED and there was concern for early SBO and recommended hospital admission. In the ED patient is afebrile hypertensive blood work showed alk phos 239 lipase 27 AST 85 ALT 201 total bili 0.9 lactic acid normal mild leukopenia 2.0 COVID-19 pending-CT abdomen pelvis with IV contrast without oral contrast (09/08/2020)-suspect ileus involving proximal mid jejunum with no evidence of high-grade obstruction, moderate ascites and other stable findings.  Chest and abdomen XR-nonobstructive bowel gas pattern.  Patient received Zofran, 1 L NS bolus, Compazine.  S/P abdomen paracentesis 3/15 W/ 2.0 LITER FLUID REMOVAL CT 3/16- " Small bowel obstruction with a transition zone seen within the posterolateral aspect of the left lower quadrant.Large, stable calcified pelvic mass consistent with the patient's known history of ovarian cancer. Mild-to-moderate amount of free fluid within the abdomen and pelvis" Seen by GI and oncology, medical oncology patient was started on diet-tolerating and being advanced slowly per oncology.  Subjective: Seen and examined this morning and again this afternoon. She was complaining of right forearm pain and swelling more this afternoon,IV line was removed in the morning by nurse due to concern for infiltration/swelling.  Assessment & Plan: Intractable nausea vomiting  suspect due to subacute small bowel obstruction seen in the CT abdomen pelvis with contrast on 3/16:previoius CT scan on 3/18 was without contrast showed suspect ileus without high-grade obstruction. Xray 3/17- contrast in colon-resolving.Tolerating diet being advanced to regular diet changing nausea medication to as needed try oral first, continue plan of care as per oncology appreciate input, discussed with Dr. Alvy Bimler this morning.    Ovarian cancer on chemotherapy Malignant ascites recent paracentesis 3/8 S/p abdominal paracentesis 3/15 with 2.9 L fluid removal.  Abdomen appears full no significant distention. Stable calcified pelvic mass seen in the CT again 3/16.  If patient able to tolerate p.o. well without vomiting oncology will consider palliative chemotherapy as outpatient  Abnormal LFTs/History of cholecystectomy Liver function remains stable and improving monitor intermittently Recent Labs  Lab 09/09/20 1052 09/10/20 0536 09/11/20 0752 09/12/20 1000 09/13/20 0525  AST 85* 42* 34 39 46*  ALT 201* 124* 90* 82* 91*  ALKPHOS 239* 164* 128* 114 120  BILITOT 0.9 1.1 0.9 1.0 0.7  PROT 6.6 5.4* 5.3* 5.1* 5.3*  ALBUMIN 3.4* 2.9* 2.7* 2.7* 2.9*   Mild pancytopenia due to recent chemotherapy.  CBC appears improved. Recent Labs  Lab 09/11/20 0752 09/12/20 1000 09/13/20 0525  HGB 10.7* 11.2* 11.9*  HCT 33.0* 33.3* 35.4*  WBC 3.2* 4.1 5.4  PLT 204 212 243   A. fib in NSR, not in anticoagulation at baseline outpatient follow-up   Right forearm swelling with right basilic superficial clot, likely from IV line and has been removed.  Elevate arm supportive care.Discussed with Dr. Alvy Bimler -advises warm compress and supportive care no need to anticoagulate and she will f/u early next week.  Generalized deconditioning/weakness patient appears to be ambulating in room.  Mobilize, continue supportive measures  Failure to thrive/deconditioning, continue Decadron.  N.p.o. for now  Recent  finding of bedbugs: She is in isolation per hospital policy.  Goals of care full code, appreciate oncology follow-up on board.  Diet Order            Diet regular Room service appropriate? Yes; Fluid consistency: Thin  Diet effective now               Patient's Body mass index is 23.62 kg/m. DVT prophylaxis: enoxaparin (LOVENOX) injection 40 mg Start: 09/09/20 2200 Code Status:   Code Status: Full Code  Family Communication: Discussed with patient and husband at the bedside today. Discussed with hematology oncology this morning who has been talking with patient's daughters and family members daily basis  Status is: Inpatient Remains inpatient appropriate because:Ongoing diagnostic testing needed not appropriate for outpatient work up, Unsafe d/c plan and Inpatient level of care appropriate due to severity of illness  Dispo: The patient is from: Home              Anticipated d/c is to: Home tomorrow if able to tolerate diet without nausea vomiting              Patient currently is not medically stable to d/c.   Difficult to place patient No   Unresulted Labs (From admission, onward)          Start     Ordered   09/10/20 0500  CBC  Daily,   R      09/09/20 1707   09/10/20 0500  Comprehensive metabolic panel  Daily,   R      09/09/20 1707        Medications reviewed:  Scheduled Meds: . enoxaparin (LOVENOX) injection  40 mg Subcutaneous Q24H  . feeding supplement  237 mL Oral TID BM  . sodium chloride flush  3 mL Intravenous Q12H   Continuous Infusions: . ondansetron (ZOFRAN) IV Stopped (09/10/20 1756)    Consultants:see note  Procedures:see note  Antimicrobials: Anti-infectives (From admission, onward)   None     Culture/Microbiology    Component Value Date/Time   SDES  09/09/2020 1148    BLOOD LEFT ANTECUBITAL Performed at [facility], [place] [address], [place], [place] [address]    [place]  09/09/2020 1148    BOTTLES DRAWN AEROBIC  AND ANAEROBIC Blood Culture adequate volume Performed at [facility] [address] [place], [place] [address]    CULT  09/09/2020 1148    NO GROWTH 4 DAYS Performed at [facility], [place] [address] [place], [place] [address]    REPTSTATUS PENDING 09/09/2020 1148    Other culture-see note  Objective: Vitals: Today's Vitals   09/12/20 2151 09/12/20 2300 09/13/20 0422 09/13/20 0500  BP: (!) 156/82  (!) 147/65   Pulse: 86  83   Resp: 20  17   Temp: 98.6 F (37 C)  97.8 F (36.6 C)   TempSrc: Oral  Oral   SpO2: 96%  93%   Weight:      Height:      PainSc:  0-No pain  5     Intake/Output Summary (Last 24 hours) at 09/13/2020 0954 Last data filed at 09/12/2020 2258 Gross per 24 hour  Intake 242.42 ml  Output --  Net 242.42 ml   Filed Weights   09/09/20 1039  Weight: 56.7 kg   Weight change:   Intake/Output from previous day: 03/17 0701 -  03/18 0700 In: 242.4 [P.O.:150; I.V.:3; IV Piggyback:89.4] Out: -  Intake/Output this shift: No intake/output data recorded. Filed Weights   09/09/20 1039  Weight: 56.7 kg    Examination: General exam: AAOx3,NAD, weak appearing. HEENT:Oral mucosa moist, Ear/Nose WNL grossly, dentition normal. Respiratory system: bilaterally diminishedd,no wheezing or crackles,no use of accessory muscle Cardiovascular system: S1 & S2 +, No JVD,. Gastrointestinal system: Abdomen soft, NT,ND, BS+ Nervous System:Alert, awake, moving extremities and grossly nonfocal Extremities: Swelling mild redness in the right forearm, no edema, distal peripheral pulses palpable.  Skin: No rashes,no icterus. MSK: Normal muscle bulk,tone, power  Data Reviewed: I have personally reviewed following labs and imaging studies CBC: Recent Labs  Lab 09/07/20 2318 09/09/20 1052 09/10/20 0536 09/11/20 0752 09/12/20 1000 09/13/20 0525  WBC 3.5* 2.0* 2.5* 3.2* 4.1 5.4  NEUTROABS 1.8 1.2*  --   --   --   --   HGB 11.1* 11.9* 10.5* 10.7*  11.2* 11.9*  HCT 34.3* 36.2 31.8* 33.0* 33.3* 35.4*  MCV 87.9 85.4 86.9 88.2 87.2 85.7  PLT 277 268 227 204 212 462   Basic Metabolic Panel: Recent Labs  Lab 09/09/20 1052 09/10/20 0536 09/11/20 0752 09/12/20 1000 09/13/20 0525  NA 139 137 138 134* 135  K 3.8 3.6 3.6 3.8 4.2  CL 99 99 100 101 100  CO2 27 25 22  20* 26  GLUCOSE 144* 122* 101* 78 116*  BUN 43* 38* 36* 35* 33*  CREATININE 1.02* 0.98 0.98 0.69 0.78  CALCIUM 8.9 8.3* 8.6* 8.0* 8.4*   GFR: Estimated Creatinine Clearance: 41.6 mL/min (by C-G formula based on SCr of 0.78 mg/dL). Liver Function Tests: Recent Labs  Lab 09/09/20 1052 09/10/20 0536 09/11/20 0752 09/12/20 1000 09/13/20 0525  AST 85* 42* 34 39 46*  ALT 201* 124* 90* 82* 91*  ALKPHOS 239* 164* 128* 114 120  BILITOT 0.9 1.1 0.9 1.0 0.7  PROT 6.6 5.4* 5.3* 5.1* 5.3*  ALBUMIN 3.4* 2.9* 2.7* 2.7* 2.9*   Recent Labs  Lab 09/07/20 2310 09/09/20 1052  LIPASE 56* 27   No results for input(s): AMMONIA in the last 168 hours. Coagulation Profile: No results for input(s): INR, PROTIME in the last 168 hours. Cardiac Enzymes: No results for input(s): CKTOTAL, CKMB, CKMBINDEX, TROPONINI in the last 168 hours. BNP (last 3 results) No results for input(s): PROBNP in the last 8760 hours. HbA1C: No results for input(s): HGBA1C in the last 72 hours. CBG: No results for input(s): GLUCAP in the last 168 hours. Lipid Profile: No results for input(s): CHOL, HDL, LDLCALC, TRIG, CHOLHDL, LDLDIRECT in the last 72 hours. Thyroid Function Tests: No results for input(s): TSH, T4TOTAL, FREET4, T3FREE, THYROIDAB in the last 72 hours. Anemia Panel: No results for input(s): VITAMINB12, FOLATE, FERRITIN, TIBC, IRON, RETICCTPCT in the last 72 hours. Sepsis Labs: Recent Labs  Lab 09/09/20 1143  LATICACIDVEN 1.2    Recent Results (from the past 240 hour(s))  Blood culture (routine x 2)     Status: None (Preliminary result)   Collection Time: 09/09/20 11:43 AM    Specimen: BLOOD  Result Value Ref Range Status   Specimen Description   Final    BLOOD BLOOD RIGHT FOREARM Performed at [facility] [address], [place], [place] [address]    Special Requests   Final    BOTTLES DRAWN AEROBIC AND ANAEROBIC Blood Culture adequate volume Performed at [facility] [address], [place], [place] [address]    Culture   Final  NO GROWTH 4 DAYS Performed at [facility], [place] [address] [place], [place] [address]    Report Status PENDING  Incomplete  Blood culture (routine x 2)     Status: None (Preliminary result)   Collection Time: 09/09/20 11:48 AM   Specimen: BLOOD  Result Value Ref Range Status   Specimen Description   Final    BLOOD LEFT ANTECUBITAL Performed at [facility] [address] [place], [place] [address]    Special Requests   Final    BOTTLES DRAWN AEROBIC AND ANAEROBIC Blood Culture adequate volume Performed at [facility] [address] [place], [place] [address]    Culture   Final    NO GROWTH 4 DAYS Performed at [facility], [place] [address] [place], [place] [address]    Report Status PENDING  Incomplete  Resp Panel by RT-PCR (Flu A&B, Covid) Nasopharyngeal Swab     Status: None   Collection Time: 09/09/20  3:22 PM   Specimen: Nasopharyngeal Swab; Nasopharyngeal(NP) swabs in vial transport medium  Result Value Ref Range Status   SARS Coronavirus 2 by RT PCR NEGATIVE NEGATIVE Final    Comment: (NOTE) SARS-CoV-2 target nucleic acids are NOT DETECTED.  The SARS-CoV-2 RNA is generally detectable in upper respiratory specimens during the acute phase of infection. The lowest concentration of SARS-CoV-2 viral copies this assay can detect is 138 copies/mL. A negative result does not preclude SARS-Cov-2 infection and should not be used as the sole basis for treatment or other patient management decisions. A negative result may occur  with  improper specimen collection/handling, submission of specimen other than nasopharyngeal swab, presence of viral mutation(s) within the areas targeted by this assay, and inadequate number of viral copies(<138 copies/mL). A negative result must be combined with clinical observations, patient history, and epidemiological information. The expected result is Negative.  Fact Sheet for Patients:  EntrepreneurPulse.com.au  Fact Sheet for Healthcare Providers:  IncredibleEmployment.be  This test is no t yet approved or cleared by the [place] FDA and  has been authorized for detection and/or diagnosis of SARS-CoV-2 by FDA under an Emergency Use Authorization (EUA). This EUA will remain  in effect (meaning this test can be used) for the duration of the COVID-19 declaration under Section 564(b)(1) of the Act, 21 U.S.C.section 360bbb-3(b)(1), unless the authorization is terminated  or revoked sooner.       Influenza A by PCR NEGATIVE NEGATIVE Final   Influenza B by PCR NEGATIVE NEGATIVE Final    Comment: (NOTE) The Xpert Xpress SARS-CoV-2/FLU/RSV plus assay is intended as an aid in the diagnosis of influenza from Nasopharyngeal swab specimens and should not be used as a sole basis for treatment. Nasal washings and aspirates are unacceptable for Xpert Xpress SARS-CoV-2/FLU/RSV testing.  Fact Sheet for Patients: EntrepreneurPulse.com.au  Fact Sheet for Healthcare Providers: IncredibleEmployment.be  This test is not yet approved or cleared by the [place] FDA and has been authorized for detection and/or diagnosis of SARS-CoV-2 by FDA under an Emergency Use Authorization (EUA). This EUA will remain in effect (meaning this test can be used) for the duration of the COVID-19 declaration under Section 564(b)(1) of the Act, 21 U.S.C. section 360bbb-3(b)(1), unless the authorization is terminated  or revoked.  Performed at [facility], [place] [address], [place], [place] [address]      Radiology Studies: CT ABDOMEN PELVIS W CONTRAST  Result Date: 09/11/2020 CLINICAL DATA:  Nausea and vomiting. EXAM: CT  ABDOMEN AND PELVIS WITH CONTRAST TECHNIQUE: Multidetector CT imaging of the abdomen and pelvis was performed using the standard protocol following bolus administration of intravenous contrast. CONTRAST:  112m OMNIPAQUE IOHEXOL 300 MG/ML  SOLN COMPARISON:  September 08, 2020 FINDINGS: Lower chest: No acute abnormality. Hepatobiliary: Small, stable cysts are seen within the anterior and posterior aspect of the right lobe of the liver. Status post cholecystectomy. No biliary dilatation. Pancreas: Unremarkable. No pancreatic ductal dilatation or surrounding inflammatory changes. Spleen: A small, stable area of parenchymal low attenuation is seen within the posterior aspect of the spleen. Adrenals/Urinary Tract: Adrenal glands are unremarkable. Kidneys are normal, without renal calculi or hydronephrosis. A 3.3 cm simple cyst is seen within the upper pole of the left kidney. Urinary bladder is partially contracted and subsequently limited in evaluation. Stomach/Bowel: There is a small hiatal hernia. The appendix is surgically absent. Multiple dilated small bowel loops are seen throughout the abdomen and pelvis (maximum small bowel diameter of approximately 4.6 cm. A transition zone is seen within the posterolateral aspect of the left lower quadrant (axial CT images 48 through 54, CT series number 2). Noninflamed diverticula are seen within the sigmoid colon. Vascular/Lymphatic: No significant vascular findings are present. No enlarged abdominal or pelvic lymph nodes. Reproductive: A large, stable, approximately 10.3 cm x 14.0 cm calcified mass is seen within the pelvis. An adjacent, stable 6.3 cm x 4.1 cm partially calcified cystic mass is seen along the expected region of the right adnexa.  Other: No abdominal wall hernia or abnormality. There is a mild-to-moderate amount of free fluid noted within the abdomen. A small amount of pelvic free fluid is also seen. Musculoskeletal: A total right hip replacement is seen with marked severity streak artifact. Approximately 5 mm anterolisthesis of the L5 vertebral body is noted on S1. IMPRESSION: 1. Small bowel obstruction with a transition zone seen within the posterolateral aspect of the left lower quadrant. 2. Large, stable calcified pelvic mass consistent with the patient's known history of ovarian cancer. 3. Mild-to-moderate amount of free fluid within the abdomen and pelvis. 4. Stable hepatic and left renal cysts. 5. Total right hip replacement. 6. Approximately 5 mm anterolisthesis of the L5 vertebral body on S1. Electronically Signed   By: TVirgina NorfolkM.D.   On: 09/11/2020 20:36   DG Abd Portable 1V-Small Bowel Obstruction Protocol-24 hr delay  Result Date: 09/12/2020 CLINICAL DATA:  Small-bowel obstruction EXAM: PORTABLE ABDOMEN - 1 VIEW COMPARISON:  09/11/2020, 09/09/2020 FINDINGS: There are a few dilated loops of small bowel within the abdomen measuring up to 3.8 cm in diameter, decreased from prior. Oral contrast from previous day CT has progressed into the colon. No complete obstruction. No gross free intraperitoneal air. Large calcified pelvic mass. Excreted contrast present within the bladder. IMPRESSION: Decreasing distension of small bowel. Contrast has progressed to the colon. Findings suggestive of resolving small bowel obstruction. Electronically Signed   By: NDavina PokeD.O.   On: 09/12/2020 08:11     LOS: 4 days   RAntonieta Pert MD [facility]  09/13/2020, 9:54 AM

## 2020-09-13 NOTE — Progress Notes (Addendum)
Initial Nutrition Assessment  INTERVENTION:   -Ensure Enlive po TID, each supplement provides 350 kcal and 20 grams of protein  NUTRITION DIAGNOSIS:   Increased nutrient needs related to cancer and cancer related treatments as evidenced by estimated needs.  GOAL:   Patient will meet greater than or equal to 90% of their needs  Progressing.  MONITOR:   PO intake,Supplement acceptance,Labs,Weight trends,I & O's  REASON FOR ASSESSMENT:   Consult Calorie Count  ASSESSMENT:   80 year old female with history of ovarian cancer on chemotherapy, malignant ascites status post paracentesis 3/8, PMR, A. fib not on anticoagulation, history of cholecystectomy appendicectomy, hyperlipidemia admitted with intractable nausea vomiting and abdominal pain x2 days.  She was seen 2 days prior to admission in the ED and was discharged on pain medication and nausea medication.  She was then seen by her oncologist Dr Alvy Bimler in the ED and there was concern for early SBO and recommended hospital admission.  Per oncology note, partial bowel obstruction is resolving. Now on regular diet.   48 hour calorie count ordered. Results below   Diet: Full liquids Supplements:  -Ensure Enlive po BID, each supplement provides 350 kcal and 20 grams of protein  3/17: Breakfast: 100 kcals (broth and icee) Lunch: 150 kcals (broth and juices) Dinner: nothing ordered Supplements: 700 kcals, 26g protein (2 Ensures)  Total intake: 950 kcal (55% of minimum estimated needs)  26g protein (32% of minimum estimated needs)  Addendum 3/18 so far Breakfast: 0% Lunch: 130 kcals, 2g protein (120 ml consumed of chicken noodle soup and ice cream) Dinner:  Supplements: 700 kcals, 40g protein (Ensure x 2) Total: 830 kcals, 42g protein  Spoke with RN about documenting intakes in flowsheet. Hopefully can have results today as pt is expected to discharge tomorrow 3/19.  Per weight records, pt has lost 23 lbs since 2/1 (15%  wt loss x 1.5 months, significant for time frame).  Medications reviewed.  Labs reviewed.  NUTRITION - FOCUSED PHYSICAL EXAM:  Deferred.  Diet Order:   Diet Order            Diet regular Room service appropriate? Yes; Fluid consistency: Thin  Diet effective now                 EDUCATION NEEDS:   Not appropriate for education at this time  Skin:  Skin Assessment: Reviewed RN Assessment  Last BM:  3/17  Height:   Ht Readings from Last 1 Encounters:  09/09/20 5\' 1"  (1.549 m)    Weight:   Wt Readings from Last 1 Encounters:  09/09/20 56.7 kg   BMI:  Body mass index is 23.62 kg/m.  Estimated Nutritional Needs:   Kcal:  1700-1900  Protein:  80-90g  Fluid:  1.7L/day  Clayton Bibles, MS, RD, LDN Inpatient Clinical Dietitian Contact information available via Amion

## 2020-09-13 NOTE — Discharge Instructions (Signed)

## 2020-09-14 LAB — CULTURE, BLOOD (ROUTINE X 2)
Culture: NO GROWTH
Culture: NO GROWTH
Special Requests: ADEQUATE
Special Requests: ADEQUATE

## 2020-09-14 LAB — COMPREHENSIVE METABOLIC PANEL
ALT: 80 U/L — ABNORMAL HIGH (ref 0–44)
AST: 34 U/L (ref 15–41)
Albumin: 2.8 g/dL — ABNORMAL LOW (ref 3.5–5.0)
Alkaline Phosphatase: 107 U/L (ref 38–126)
Anion gap: 11 (ref 5–15)
BUN: 25 mg/dL — ABNORMAL HIGH (ref 8–23)
CO2: 24 mmol/L (ref 22–32)
Calcium: 8.3 mg/dL — ABNORMAL LOW (ref 8.9–10.3)
Chloride: 100 mmol/L (ref 98–111)
Creatinine, Ser: 0.73 mg/dL (ref 0.44–1.00)
GFR, Estimated: >60 mL/min (ref >60)
Glucose, Bld: 120 mg/dL — ABNORMAL HIGH (ref 70–99)
Potassium: 3.7 mmol/L (ref 3.5–5.1)
Sodium: 135 mmol/L (ref 135–145)
Total Bilirubin: 1 mg/dL (ref 0.3–1.2)
Total Protein: 5.3 g/dL — ABNORMAL LOW (ref 6.5–8.1)

## 2020-09-14 LAB — CBC
HCT: 33.6 % — ABNORMAL LOW (ref 36.0–46.0)
Hemoglobin: 11.2 g/dL — ABNORMAL LOW (ref 12.0–15.0)
MCH: 28.3 pg (ref 26.0–34.0)
MCHC: 33.3 g/dL (ref 30.0–36.0)
MCV: 84.8 fL (ref 80.0–100.0)
Platelets: 192 10*3/uL (ref 150–400)
RBC: 3.96 MIL/uL (ref 3.87–5.11)
RDW: 16 % — ABNORMAL HIGH (ref 11.5–15.5)
WBC: 7 10*3/uL (ref 4.0–10.5)
nRBC: 0 % (ref 0.0–0.2)

## 2020-09-14 MED ORDER — ENSURE ENLIVE PO LIQD: 237.0000 mL | Freq: Three times a day (TID) | ORAL | 0 refills | Status: AC

## 2020-09-14 MED ORDER — POLYETHYLENE GLYCOL 3350 17 G PO PACK: 17.0000 g | PACK | Freq: Every day | ORAL | 0 refills | Status: DC | PRN

## 2020-09-14 NOTE — Discharge Summary (Signed)
Physician Discharge Summary  [name] RFF:[id number] DOB: 30-Jan-1941 DOA: 09/09/2020  PCP: [name], MD  Admit date: 09/09/2020 Discharge date: 09/14/2020  Admitted From: Home Disposition: Home  Recommendations for Outpatient Follow-up:  Follow up with PCP in 1-2 weeks and with oncology next week Please obtain BMP/CBC in one week Monitor the swelling on the right forearm  Home Health:no  Equipment/Devices: none  Discharge Condition: Stable Code Status:   Code Status: Full Code Diet recommendation:  Diet Order             Diet general           Diet regular Room service appropriate? Yes; Fluid consistency: Thin  Diet effective now                    Brief/Interim Summary: 80 year old female with history of ovarian cancer on chemotherapy, malignant ascites status post paracentesis 3/8, PMR, A. fib not on anticoagulation, history of cholecystectomy appendicectomy, hyperlipidemia admitted with intractable nausea vomiting and abdominal pain x2 days.  She was seen 2 days prior to admission in the ED and was discharged on pain medication and nausea medication.  She was then seen by her oncologist Dr Alvy Bimler in the ED and there was concern for early SBO and recommended hospital admission. In the ED patient is afebrile hypertensive blood work showed alk phos 239 lipase 27 AST 85 ALT 201 total bili 0.9 lactic acid normal mild leukopenia 2.0 COVID-19 pending-CT abdomen pelvis with IV contrast without oral contrast (09/08/2020)-suspect ileus involving proximal mid jejunum with no evidence of high-grade obstruction, moderate ascites and other stable findings.  Chest and abdomen XR-nonobstructive bowel gas pattern.  Patient received Zofran, 1 L NS bolus, Compazine.  S/P abdomen paracentesis 3/15 W/ 2.0 LITER FLUID REMOVAL CT 3/16- " Small bowel obstruction with a transition zone seen within the posterolateral aspect of the left lower quadrant.Large, stable calcified pelvic mass  consistent with the patient's known history of ovarian cancer. Mild-to-moderate amount of free fluid within the abdomen and pelvis" Seen by GI and oncology, medical oncology patient was started on diet-tolerating and being advanced slowly per oncology.  Patient was able to tolerate diet well, on regular diet.  No nausea vomiting.  Patient feels ready for home this morning, she is being discharged home  .   Discharge Diagnoses:  Intractable nausea vomiting suspect due to subacute small bowel obstruction seen in the CT abdomen pelvis with contrast on 3/16:previoius CT scan on 3/18 was without contrast showed suspect ileus without high-grade obstruction. Xray 3/17- contrast in colon-resolving SBO.  Diet was slowly advanced.  At this time she is tolerating regular diet.  No nausea vomiting.  Plan for discharge home today and outpatient follow-up with oncology.    Ovarian cancer on chemotherapy/Malignant ascites recent paracentesis 3/8: S/p abdominal paracentesis 3/15 with 2.9 L fluid removal.  Abdomen appears full no significant distention. Stable calcified pelvic mass seen in the CT again 3/16.  If no issue with nausea vomiting oncology will consider palliative chemotherapy as outpatient  Abnormal LFTs/History of cholecystectomy: AST improved to 34 ALT 80 total bili 1.0.  Outpatient follow-up.  Mild pancytopenia due to recent chemotherapy.  CBC appears improved. Recent Labs  Lab 09/12/20 1000 09/13/20 0525 09/14/20 0542  HGB 11.2* 11.9* 11.2*  HCT 33.3* 35.4* 33.6*  WBC 4.1 5.4 7.0  PLT 212 243 192   A. fib in NSR, not in anticoagulation at baseline outpatient follow-up advised with her cardiologist . husband  report it is transient episode.  Right forearm swelling with right basilic superficial clot, likely from IV line and has been removed.  Elevate arm supportive care.Discussed with Dr. Alvy Bimler -advised warm compress and supportive care no need to anticoagulate and she will f/u early next  week.  Generalized deconditioning/weakness patient appears to be ambulating in room.  Mobilize, continue supportive measures  Failure to thrive/deconditioning, continue Decadron.  N.p.o. for now  Recent finding of bedbugs: She is in isolation per hospital policy.  Goals of care full code, appreciate oncology follow-up on board.  Consults: Hematology oncology Gyn onc  Subjective: Patient is already dressed up sitting on the wheelchair ready to get discharged.  Husband at the bedside.  Reports no new complaints tolerating diet well requesting for discharge as soon as possible this morning.  Patient reports right forearm pain and swelling is improving from yesterday.  I discussed in length to monitor for swelling if it extends further or  worsens she will need repeat ultrasound to make sure it is not involving deep vein which would need anticoagulation and she is to follow-up with Dr. Alvy Bimler early next week. Discharge Exam: Vitals:   09/13/20 2002 09/14/20 0633  BP: (!) 150/84 (!) 151/79  Pulse: 94 96  Resp: 17 16  Temp: (!) 97.4 F (36.3 C) 98.2 F (36.8 C)  SpO2: 96% 97%   General: Pt is alert, awake, not in acute distress Cardiovascular: RRR, S1/S2 +, no rubs, no gallops Respiratory: CTA bilaterally, no wheezing, no rhonchi Abdominal: Soft, NT, ND, bowel sounds + Extremities: no edema, no cyanosis, right forearm mild tenderness swelling present and has mild redness  Discharge Instructions  Discharge Instructions     Diet general   Complete by: As directed    Discharge instructions   Complete by: As directed    Please call call MD or return to ER for similar or worsening recurring problem that brought you to hospital or if any fever,nausea/vomiting,abdominal pain, uncontrolled pain, chest pain,  shortness of breath or any other alarming symptoms.  Warm compress the right forearm and if you notice worsening swelling pain redness or if it is extending in uper arm- notify Dr  Alvy Bimler or return to ED immediately.  Please follow-up your doctor as instructed in a week time and call the office for appointment. Please follow-up with oncology to discuss further treatment plan  Please avoid alcohol, smoking, or any other illicit substance and maintain healthy habits including taking your regular medications as prescribed.  You were cared for by a hospitalist during your hospital stay. If you have any questions about your discharge medications or the care you received while you were in the hospital after you are discharged, you can call the unit and ask to speak with the hospitalist on call if the hospitalist that took care of you is not available.  Once you are discharged, your primary care physician will handle any further medical issues. Please note that NO REFILLS for any discharge medications will be authorized once you are discharged, as it is imperative that you return to your primary care physician (or establish a relationship with a primary care physician if you do not have one) for your aftercare needs so that they can reassess your need for medications and monitor your lab values   Increase activity slowly   Complete by: As directed       Allergies as of 09/14/2020       Reactions   Cephalexin Other (See Comments)  dehydrated   Hydrocodone-guaifenesin    Dizzy Other reaction(s): dizzy   Tape Other (See Comments)   Surgical tape Other reaction(s): Unknown   Tramadol Hcl    Other reaction(s): Constipation        Medication List     TAKE these medications    dexamethasone 4 MG tablet Commonly known as: DECADRON Take 1 tablet (4 mg total) by mouth 2 (two) times daily.   feeding supplement Liqd Take 237 mLs by mouth 3 (three) times daily between meals for 7 days.   magnesium oxide 400 MG tablet Commonly known as: MAG-OX Take 400 mg by mouth daily.   Multi-Vitamins Tabs Take 1 tablet by mouth daily.   ondansetron 8 MG tablet Commonly known  as: Zofran Take 1 tablet (8 mg total) by mouth every 8 (eight) hours as needed for refractory nausea / vomiting. Start on day 3 after carboplatin chemo.   oxyCODONE 5 MG immediate release tablet Commonly known as: Roxicodone Take 1 tablet (5 mg total) by mouth every 4 (four) hours as needed for severe pain. What changed: Another medication with the same name was removed. Continue taking this medication, and follow the directions you see here.   polyethylene glycol 17 g packet Commonly known as: MIRALAX / GLYCOLAX Take 17 g by mouth daily as needed for severe constipation.   pravastatin 20 MG tablet Commonly known as: PRAVACHOL Take 1 tablet (20 mg total) by mouth daily.   prochlorperazine 10 MG tablet Commonly known as: COMPAZINE Take 1 tablet (10 mg total) by mouth every 6 (six) hours as needed (Nausea or vomiting). What changed: Another medication with the same name was removed. Continue taking this medication, and follow the directions you see here.        Follow-up Information     [name], MD. Call.   Specialty: Hematology and Oncology Contact information: [place] [address] [phone]         [name], MD Follow up in 1 week(s).   Specialty: Family Medicine Contact information: [place] [address] [phone]                Allergies  Allergen Reactions   Cephalexin Other (See Comments)    dehydrated   Hydrocodone-Guaifenesin     Dizzy  Other reaction(s): dizzy   Tape Other (See Comments)    Surgical tape Other reaction(s): Unknown   Tramadol Hcl     Other reaction(s): Constipation    The results of significant diagnostics from this hospitalization (including imaging, microbiology, ancillary and laboratory) are listed below for reference.    Microbiology: Recent Results (from the past 240 hour(s))  Blood culture (routine x 2)     Status: None (Preliminary result)    Collection Time: 09/09/20 11:43 AM   Specimen: BLOOD  Result Value Ref Range Status   Specimen Description   Final    BLOOD BLOOD RIGHT FOREARM Performed at [facility] [address], [place], [place] [address]    Special Requests   Final    BOTTLES DRAWN AEROBIC AND ANAEROBIC Blood Culture adequate volume Performed at [facility] [address], [place], [place] [address]    Culture   Final    NO GROWTH 4 DAYS Performed at [facility], [place] [address], [place], [place] [address]    Report Status PENDING  Incomplete  Blood culture (routine x 2)     Status: None (  Preliminary result)   Collection Time: 09/09/20 11:48 AM   Specimen: BLOOD  Result Value Ref Range Status   Specimen Description   Final    BLOOD LEFT ANTECUBITAL Performed at [facility] [address] [place], [place] [address]    Special Requests   Final    BOTTLES DRAWN AEROBIC AND ANAEROBIC Blood Culture adequate volume Performed at [facility] [address], [place], [place] [address]    Culture   Final    NO GROWTH 4 DAYS Performed at [facility], [place] [address] [place], [place] [address]    Report Status PENDING  Incomplete  Resp Panel by RT-PCR (Flu A&B, Covid) Nasopharyngeal Swab     Status: None   Collection Time: 09/09/20  3:22 PM   Specimen: Nasopharyngeal Swab; Nasopharyngeal(NP) swabs in vial transport medium  Result Value Ref Range Status   SARS Coronavirus 2 by RT PCR NEGATIVE NEGATIVE Final    Comment: (NOTE) SARS-CoV-2 target nucleic acids are NOT DETECTED.  The SARS-CoV-2 RNA is generally detectable in upper respiratory specimens during the acute phase of infection. The lowest concentration of SARS-CoV-2 viral copies this assay can detect is 138 copies/mL. A negative result does not preclude SARS-Cov-2 infection and should not be used as the sole basis for treatment or other patient management  decisions. A negative result may occur with  improper specimen collection/handling, submission of specimen other than nasopharyngeal swab, presence of viral mutation(s) within the areas targeted by this assay, and inadequate number of viral copies(<138 copies/mL). A negative result must be combined with clinical observations, patient history, and epidemiological information. The expected result is Negative.  Fact Sheet for Patients:  EntrepreneurPulse.com.au  Fact Sheet for Healthcare Providers:  IncredibleEmployment.be  This test is no t yet approved or cleared by the [place] FDA and  has been authorized for detection and/or diagnosis of SARS-CoV-2 by FDA under an Emergency Use Authorization (EUA). This EUA will remain  in effect (meaning this test can be used) for the duration of the COVID-19 declaration under Section 564(b)(1) of the Act, 21 U.S.C.section 360bbb-3(b)(1), unless the authorization is terminated  or revoked sooner.       Influenza A by PCR NEGATIVE NEGATIVE Final   Influenza B by PCR NEGATIVE NEGATIVE Final    Comment: (NOTE) The Xpert Xpress SARS-CoV-2/FLU/RSV plus assay is intended as an aid in the diagnosis of influenza from Nasopharyngeal swab specimens and should not be used as a sole basis for treatment. Nasal washings and aspirates are unacceptable for Xpert Xpress SARS-CoV-2/FLU/RSV testing.  Fact Sheet for Patients: EntrepreneurPulse.com.au  Fact Sheet for Healthcare Providers: IncredibleEmployment.be  This test is not yet approved or cleared by the [place] FDA and has been authorized for detection and/or diagnosis of SARS-CoV-2 by FDA under an Emergency Use Authorization (EUA). This EUA will remain in effect (meaning this test can be used) for the duration of the COVID-19 declaration under Section 564(b)(1) of the Act, 21 U.S.C. section 360bbb-3(b)(1), unless the  authorization is terminated or revoked.  Performed at [facility], [place] [address], [place],  [address]     Procedures/Studies: CT ABDOMEN PELVIS W CONTRAST  Result Date: 09/11/2020 CLINICAL DATA:  Nausea and vomiting. EXAM: CT ABDOMEN AND PELVIS WITH CONTRAST TECHNIQUE: Multidetector CT imaging of the abdomen and pelvis was performed using the standard protocol following bolus administration of intravenous contrast. CONTRAST:  130m OMNIPAQUE IOHEXOL 300 MG/ML  SOLN COMPARISON:  September 08, 2020 FINDINGS: Lower chest: No acute abnormality. Hepatobiliary: Small, stable cysts are seen within the anterior and posterior aspect of the right lobe of the liver. Status post cholecystectomy. No biliary dilatation. Pancreas: Unremarkable. No pancreatic ductal dilatation or surrounding inflammatory changes. Spleen: A small, stable area of parenchymal low attenuation is seen within the posterior aspect of the spleen. Adrenals/Urinary Tract: Adrenal glands are unremarkable. Kidneys are normal, without renal calculi or hydronephrosis. A 3.3 cm simple cyst is seen within the upper pole of the left kidney. Urinary bladder is partially contracted and subsequently limited in evaluation. Stomach/Bowel: There is a small hiatal hernia. The appendix is surgically absent. Multiple dilated small bowel loops are seen throughout the abdomen and pelvis (maximum small bowel diameter of approximately 4.6 cm. A transition zone is seen within the posterolateral aspect of the left lower quadrant (axial CT images 48 through 54, CT series number 2). Noninflamed diverticula are seen within the sigmoid colon. Vascular/Lymphatic: No significant vascular findings are present. No enlarged abdominal or pelvic lymph nodes. Reproductive: A large, stable, approximately 10.3 cm x 14.0 cm calcified mass is seen within the pelvis. An adjacent, stable 6.3 cm x 4.1 cm partially calcified cystic mass is seen along the expected  region of the right adnexa. Other: No abdominal wall hernia or abnormality. There is a mild-to-moderate amount of free fluid noted within the abdomen. A small amount of pelvic free fluid is also seen. Musculoskeletal: A total right hip replacement is seen with marked severity streak artifact. Approximately 5 mm anterolisthesis of the L5 vertebral body is noted on S1. IMPRESSION: 1. Small bowel obstruction with a transition zone seen within the posterolateral aspect of the left lower quadrant. 2. Large, stable calcified pelvic mass consistent with the patient's known history of ovarian cancer. 3. Mild-to-moderate amount of free fluid within the abdomen and pelvis. 4. Stable hepatic and left renal cysts. 5. Total right hip replacement. 6. Approximately 5 mm anterolisthesis of the L5 vertebral body on S1. Electronically Signed   By: Virgina Norfolk M.D.   On: 09/11/2020 20:36   CT ABDOMEN PELVIS W CONTRAST  Result Date: 09/08/2020 CLINICAL DATA:  Right upper quadrant swelling, intermittent pain, bearing cancer with malignant ascites EXAM: CT ABDOMEN AND PELVIS WITH CONTRAST TECHNIQUE: Multidetector CT imaging of the abdomen and pelvis was performed using the standard protocol following bolus administration of intravenous contrast. CONTRAST:  174m OMNIPAQUE IOHEXOL 300 MG/ML  SOLN COMPARISON:  07/22/2020 FINDINGS: Lower chest: No acute pleural or parenchymal lung disease. Calcified paraesophageal nodules measuring up to 6 mm unchanged, consistent with known metastatic ovarian cancer. Hepatobiliary: Stable indeterminate subcentimeter liver hypodensities. No intrahepatic biliary duct dilation. Gallbladder surgically absent. Pancreas: Unremarkable. No pancreatic ductal dilatation or surrounding inflammatory changes. Spleen: Stable indeterminate hypodensity posterior subcapsular region of the spleen, likely a small hemangioma. Adrenals/Urinary Tract: Stable left renal cyst. The kidneys enhance normally and  symmetrically without obstructive uropathy. No urinary tract calculi. The adrenals are unremarkable. The bladder is decompressed, limiting its evaluation. Stomach/Bowel: Fluid-filled mildly dilated loops of jejunum within the left mid abdomen measure up to 2.4 cm in size, nonspecific. There are some scattered gas fluid levels consistent with regional ileus. Distal small bowel is relatively decompressed. There is gas and stool throughout the colon to the level of the rectum. Vascular/Lymphatic: Mild atherosclerosis unchanged. Multiple calcified retroperitoneal nodules consistent with metastatic disease. No new adenopathy. Reproductive: Enlarged calcified mass arising from the right adnexa measuring approximately 14 x 15 cm, not significantly changed since prior  study. Calcified peritoneal masses within the central mesentery are again noted, without significant change since prior exam. Largest calcified mass within the central abdomen image 40/2 measures 3.4 x 2.4 cm, previous having measured approximately 3.7 x 2.1 cm. Findings are consistent with known history of metastatic ovarian cancer. Other: There is moderate ascites throughout the abdomen and pelvis, decreased since previous CT scan. No free intraperitoneal gas. No abdominal wall hernia. Musculoskeletal: Unremarkable right hip arthroplasty. No acute or destructive bony lesions. Stable intramuscular lipoma left lateral abdominal wall. Reconstructed images demonstrate no additional findings. IMPRESSION: 1. Large calcified pelvic mass arising from the right adnexa, consistent with known history of ovarian cancer. 2. Stable calcified metastatic implants throughout the mesentery. 3. Stable calcified nodules within the retroperitoneum, epiphrenic region, and paraesophageal regions, consistent with metastatic disease. 4. Moderate ascites. 5. Likely ileus involving the proximal to mid jejunum. No evidence of high-grade obstruction. Electronically Signed   By: Randa Ngo M.D.   On: 09/08/2020 03:59   US Paracentesis  Result Date: 09/10/2020 INDICATION: Patient with a history of ovarian cancer and recurrent malignant ascites. Interventional radiology asked to perform a therapeutic paracentesis. EXAM: ULTRASOUND GUIDED PARACENTESIS MEDICATIONS: 1% lidocaine 10 mL COMPLICATIONS: None immediate. PROCEDURE: Informed written consent was obtained from the patient after a discussion of the risks, benefits and alternatives to treatment. A timeout was performed prior to the initiation of the procedure. Initial ultrasound scanning demonstrates a large amount of ascites within the right lower abdominal quadrant. The right lower abdomen was prepped and draped in the usual sterile fashion. 1% lidocaine was used for local anesthesia. Following this, a 19 gauge, 7-cm, Yueh catheter was introduced. An ultrasound image was saved for documentation purposes. The paracentesis was performed. The catheter was removed and a dressing was applied. The patient tolerated the procedure well without immediate post procedural complication. FINDINGS: A total of approximately 2.9 L of clear yellow fluid was removed. IMPRESSION: Successful ultrasound-guided paracentesis yielding 2.9 liters of peritoneal fluid. Read by: Soyla Dryer, NP Electronically Signed   By: Sandi Mariscal M.D.   On: 09/10/2020 16:28   US Paracentesis  Result Date: 09/03/2020 INDICATION: Patient with history of ovarian cancer with recurrent malignant ascites. Request received for therapeutic paracentesis up to 5 liters. EXAM: ULTRASOUND GUIDED THERAPEUTIC PARACENTESIS MEDICATIONS: 1% lidocaine to skin and subcutaneous tissue COMPLICATIONS: None immediate. PROCEDURE: Informed written consent was obtained from the patient after a discussion of the risks, benefits and alternatives to treatment. A timeout was performed prior to the initiation of the procedure. Initial ultrasound scanning demonstrates a large amount of ascites within  the left lower abdominal quadrant. The left lower abdomen was prepped and draped in the usual sterile fashion. 1% lidocaine was used for local anesthesia. Following this, a 19 gauge, 10-cm, Yueh catheter was introduced. An ultrasound image was saved for documentation purposes. The paracentesis was performed. The catheter was removed and a dressing was applied. The patient tolerated the procedure well without immediate post procedural complication. FINDINGS: A total of approximately 5 liters of yellow fluid was removed. IMPRESSION: Successful ultrasound-guided therapeutic paracentesis yielding 5 liters of peritoneal fluid. Read by: Rowe Robert, PA-C Electronically Signed   By: Jacqulynn Cadet M.D.   On: 09/03/2020 15:28   DG Abdomen Acute W/Chest  Result Date: 09/09/2020 CLINICAL DATA:  Emesis EXAM: DG ABDOMEN ACUTE WITH 1 VIEW CHEST COMPARISON:  None. FINDINGS: Suboptimal visualization due to body habitus. There are no significantly dilated loops identified. No free air. No air-fluid levels.  Cholecystectomy clips. No consolidation or edema. No pleural effusion. Heart size is normal for technique. Right shoulder arthroplasty. Advanced left glenohumeral osteoarthritis. Right hip arthroplasty. IMPRESSION: Nonobstructive bowel gas pattern. Electronically Signed   By: Macy Mis M.D.   On: 09/09/2020 13:11   DG Abd Portable 1V-Small Bowel Obstruction Protocol-24 hr delay  Result Date: 09/12/2020 CLINICAL DATA:  Small-bowel obstruction EXAM: PORTABLE ABDOMEN - 1 VIEW COMPARISON:  09/11/2020, 09/09/2020 FINDINGS: There are a few dilated loops of small bowel within the abdomen measuring up to 3.8 cm in diameter, decreased from prior. Oral contrast from previous day CT has progressed into the colon. No complete obstruction. No gross free intraperitoneal air. Large calcified pelvic mass. Excreted contrast present within the bladder. IMPRESSION: Decreasing distension of small bowel. Contrast has progressed to  the colon. Findings suggestive of resolving small bowel obstruction. Electronically Signed   By: Davina Poke D.O.   On: 09/12/2020 08:11   VAS [place] UPPER EXTREMITY VENOUS DUPLEX  Result Date: 09/13/2020 UPPER VENOUS STUDY  Indications: Erythema, Swelling, and Pain Risk Factors: Cancer Patient on treatment (ovarian) Afib - not on anticoagulant, recent IV sticks to RUE (bloodwork). Comparison Study: No previous exams Performing Technologist: Rogelia Rohrer  Examination Guidelines: A complete evaluation includes B-mode imaging, spectral Doppler, color Doppler, and power Doppler as needed of all accessible portions of each vessel. Bilateral testing is considered an integral part of a complete examination. Limited examinations for reoccurring indications may be performed as noted.  Right Findings: +----------+------------+---------+-----------+----------+---------------------+ RIGHT     CompressiblePhasicitySpontaneousProperties       Summary        +----------+------------+---------+-----------+----------+---------------------+ IJV           Full       Yes       Yes                                    +----------+------------+---------+-----------+----------+---------------------+ Subclavian    Full       Yes       Yes                                    +----------+------------+---------+-----------+----------+---------------------+ Axillary      Full       Yes       Yes                                    +----------+------------+---------+-----------+----------+---------------------+ Brachial      Full       Yes       Yes                                    +----------+------------+---------+-----------+----------+---------------------+ Radial        Full                                                        +----------+------------+---------+-----------+----------+---------------------+ Ulnar         Full                                                         +----------+------------+---------+-----------+----------+---------------------+  Cephalic      Full                                                        +----------+------------+---------+-----------+----------+---------------------+ Basilic       None       No        No                  Acute - in the                                                           forearm portion.    +----------+------------+---------+-----------+----------+---------------------+  Left Findings: +----------+------------+---------+-----------+----------+-------+ LEFT      CompressiblePhasicitySpontaneousPropertiesSummary +----------+------------+---------+-----------+----------+-------+ Subclavian    Full       Yes       Yes                      +----------+------------+---------+-----------+----------+-------+  Summary:  Right: No evidence of deep vein thrombosis in the upper extremity. Findings consistent with acute superficial vein thrombosis involving the right forearm portion of the basilic vein.  Left: No evidence of thrombosis in the subclavian.  *See table(s) above for measurements and observations.    Preliminary      Labs: BNP (last 3 results) No results for input(s): BNP in the last 8760 hours. Basic Metabolic Panel: Recent Labs  Lab 09/10/20 0536 09/11/20 0752 09/12/20 1000 09/13/20 0525 09/14/20 0542  NA 137 138 134* 135 135  K 3.6 3.6 3.8 4.2 3.7  CL 99 100 101 100 100  CO2 25 22 20* 26 24  GLUCOSE 122* 101* 78 116* 120*  BUN 38* 36* 35* 33* 25*  CREATININE 0.98 0.98 0.69 0.78 0.73  CALCIUM 8.3* 8.6* 8.0* 8.4* 8.3*   Liver Function Tests: Recent Labs  Lab 09/10/20 0536 09/11/20 0752 09/12/20 1000 09/13/20 0525 09/14/20 0542  AST 42* 34 39 46* 34  ALT 124* 90* 82* 91* 80*  ALKPHOS 164* 128* 114 120 107  BILITOT 1.1 0.9 1.0 0.7 1.0  PROT 5.4* 5.3* 5.1* 5.3* 5.3*  ALBUMIN 2.9* 2.7* 2.7* 2.9* 2.8*   Recent Labs  Lab 09/07/20 2310 09/09/20 1052  LIPASE  56* 27   No results for input(s): AMMONIA in the last 168 hours. CBC: Recent Labs  Lab 09/07/20 2318 09/09/20 1052 09/10/20 0536 09/11/20 0752 09/12/20 1000 09/13/20 0525 09/14/20 0542  WBC 3.5* 2.0* 2.5* 3.2* 4.1 5.4 7.0  NEUTROABS 1.8 1.2*  --   --   --   --   --   HGB 11.1* 11.9* 10.5* 10.7* 11.2* 11.9* 11.2*  HCT 34.3* 36.2 31.8* 33.0* 33.3* 35.4* 33.6*  MCV 87.9 85.4 86.9 88.2 87.2 85.7 84.8  PLT 277 268 227 204 212 243 192   Cardiac Enzymes: No results for input(s): CKTOTAL, CKMB, CKMBINDEX, TROPONINI in the last 168 hours. BNP: Invalid input(s): POCBNP CBG: No results for input(s): GLUCAP in the last 168 hours. D-Dimer No results for input(s): DDIMER in the last 72 hours. Hgb A1c No results for input(s): HGBA1C in the last 72 hours. Lipid Profile No results for input(s):  CHOL, HDL, LDLCALC, TRIG, CHOLHDL, LDLDIRECT in the last 72 hours. Thyroid function studies No results for input(s): TSH, T4TOTAL, T3FREE, THYROIDAB in the last 72 hours.  Invalid input(s): FREET3 Anemia work up No results for input(s): VITAMINB12, FOLATE, FERRITIN, TIBC, IRON, RETICCTPCT in the last 72 hours. Urinalysis    Component Value Date/Time   COLORURINE YELLOW 09/08/2020 0055   APPEARANCEUR CLEAR 09/08/2020 0055   LABSPEC 1.017 09/08/2020 0055   PHURINE 5.0 09/08/2020 0055   GLUCOSEU NEGATIVE 09/08/2020 0055   HGBUR NEGATIVE 09/08/2020 0055   BILIRUBINUR NEGATIVE 09/08/2020 0055   KETONESUR NEGATIVE 09/08/2020 0055   PROTEINUR NEGATIVE 09/08/2020 0055   NITRITE NEGATIVE 09/08/2020 0055   LEUKOCYTESUR TRACE (A) 09/08/2020 0055   Sepsis Labs Invalid input(s): PROCALCITONIN,  WBC,  LACTICIDVEN Microbiology Recent Results (from the past 240 hour(s))  Blood culture (routine x 2)     Status: None (Preliminary result)   Collection Time: 09/09/20 11:43 AM   Specimen: BLOOD  Result Value Ref Range Status   Specimen Description   Final    BLOOD BLOOD RIGHT FOREARM Performed at  [facility] [place] [address] [place], [place] [address]    Special Requests   Final    BOTTLES DRAWN AEROBIC AND ANAEROBIC Blood Culture adequate volume Performed at [place] [address] [place], [place] [address]    Culture   Final    NO GROWTH 4 DAYS Performed at [facility], [place] [address] [place], [place] [address]    Report Status PENDING  Incomplete  Blood culture (routine x 2)     Status: None (Preliminary result)   Collection Time: 09/09/20 11:48 AM   Specimen: BLOOD  Result Value Ref Range Status   Specimen Description   Final    BLOOD LEFT ANTECUBITAL Performed at [place] [address] [place], [place] [address]    Special Requests   Final    BOTTLES DRAWN AEROBIC AND ANAEROBIC Blood Culture adequate volume Performed at [place] [address], [place], [place] [address]    Culture   Final    NO GROWTH 4 DAYS Performed at [facility], [place] [address], [place], [place] [address]    Report Status PENDING  Incomplete  Resp Panel by RT-PCR (Flu A&B, Covid) Nasopharyngeal Swab     Status: None   Collection Time: 09/09/20  3:22 PM   Specimen: Nasopharyngeal Swab; Nasopharyngeal(NP) swabs in vial transport medium  Result Value Ref Range Status   SARS Coronavirus 2 by RT PCR NEGATIVE NEGATIVE Final    Comment: (NOTE) SARS-CoV-2 target nucleic acids are NOT DETECTED.  The SARS-CoV-2 RNA is generally detectable in upper respiratory specimens during the acute phase of infection. The lowest concentration of SARS-CoV-2 viral copies this assay can detect is 138 copies/mL. A negative result does not preclude SARS-Cov-2 infection and should not be used as the sole basis for treatment or other patient management decisions. A negative result may occur with  improper specimen collection/handling, submission of specimen other than nasopharyngeal swab, presence of viral  mutation(s) within the areas targeted by this assay, and inadequate number of viral copies(<138 copies/mL). A negative result must be combined with clinical observations, patient history, and epidemiological information. The expected result is Negative.  Fact Sheet for Patients:  EntrepreneurPulse.com.au  Fact Sheet for Healthcare Providers:  IncredibleEmployment.be  This test is no t yet approved or cleared by the [place] and  has been authorized for detection and/or diagnosis of SARS-CoV-2 by FDA under an Emergency Use Authorization (EUA). This EUA will remain  in effect (meaning this test can be used) for the duration of the COVID-19 declaration under Section 564(b)(1) of the Act, 21 U.S.C.section 360bbb-3(b)(1), unless the authorization is terminated  or revoked sooner.       Influenza A by PCR NEGATIVE NEGATIVE Final   Influenza B by PCR NEGATIVE NEGATIVE Final    Comment: (NOTE) The Xpert Xpress SARS-CoV-2/FLU/RSV plus assay is intended as an aid in the diagnosis of influenza from Nasopharyngeal swab specimens and should not be used as a sole basis for treatment. Nasal washings and aspirates are unacceptable for Xpert Xpress SARS-CoV-2/FLU/RSV testing.  Fact Sheet for Patients: EntrepreneurPulse.com.au  Fact Sheet for Healthcare Providers: IncredibleEmployment.be  This test is not yet approved or cleared by the [place] FDA and has been authorized for detection and/or diagnosis of SARS-CoV-2 by FDA under an Emergency Use Authorization (EUA). This EUA will remain in effect (meaning this test can be used) for the duration of the COVID-19 declaration under Section 564(b)(1) of the Act, 21 U.S.C. section 360bbb-3(b)(1), unless the authorization is terminated or revoked.  Performed at [facility], [place] [address] [place], [place] [address]      Time coordinating  discharge: 25 minutes  SIGNED: Antonieta Pert, MD  [facility] 09/14/2020, 10:31 AM  If 7PM-7AM, please contact night-coverage [email or web address]

## 2020-09-14 NOTE — Progress Notes (Signed)
Pt discharged to home. DC instructions given to husband at bedside. No concerns voiced. 3 heat packs given per MD's order. Pt left in wheelchair pushed by nurse tech accompanied by husband. Left in stable condition.  VWilliams,RN.

## 2020-09-16 ENCOUNTER — Inpatient Hospital Stay: Payer: Medicare Other | Admitting: Hematology and Oncology

## 2020-09-16 ENCOUNTER — Inpatient Hospital Stay: Payer: Medicare Other

## 2020-09-16 ENCOUNTER — Telehealth: Payer: Self-pay | Admitting: Oncology

## 2020-09-16 NOTE — Telephone Encounter (Signed)
Called [name] and spoke to her daughter [name]. Advised her that [name]'s appointments for today with Dr. Alvy Bimler, lab and infusion have been canceled.  Rescheduled appointment for 9:45 on 09/20/20 with Dr .Alvy Bimler.    [name] also said that [name]'s right forearm is red, painful and has a hard bump on it.  She is wondering if they need to keep their appointment with Dr. Alvy Bimler today to have it evaluated.  She asked [name] if she thinks her arm is worse since she left the hospital and [name] said "it's about the same."    Called [name] back after reviewing with Dr. Alvy Bimler.  Advised her that she does not need to be seen by Dr. Alvy Bimler today and to keep her appointment on Friday. Discussed that if her arm gets worse she can always go the ER to be evaluated. [name] verbalized understanding and agreement.

## 2020-09-17 ENCOUNTER — Telehealth: Payer: Self-pay

## 2020-09-17 NOTE — Telephone Encounter (Signed)
Returned call to husband. Scheduled appt for tomorrow at 1:45 pm, he is aware of appt time/date.

## 2020-09-18 ENCOUNTER — Encounter: Payer: Self-pay | Admitting: Hematology and Oncology

## 2020-09-18 ENCOUNTER — Telehealth: Payer: Self-pay

## 2020-09-18 ENCOUNTER — Telehealth: Payer: Self-pay | Admitting: Oncology

## 2020-09-18 ENCOUNTER — Inpatient Hospital Stay (HOSPITAL_BASED_OUTPATIENT_CLINIC_OR_DEPARTMENT_OTHER): Payer: Medicare Other | Admitting: Hematology and Oncology

## 2020-09-18 ENCOUNTER — Other Ambulatory Visit: Payer: Self-pay

## 2020-09-18 DIAGNOSIS — R531 Weakness: Secondary | ICD-10-CM | POA: Diagnosis not present

## 2020-09-18 DIAGNOSIS — R0981 Nasal congestion: Secondary | ICD-10-CM | POA: Diagnosis not present

## 2020-09-18 DIAGNOSIS — Z7189 Other specified counseling: Secondary | ICD-10-CM | POA: Diagnosis not present

## 2020-09-18 DIAGNOSIS — C569 Malignant neoplasm of unspecified ovary: Secondary | ICD-10-CM

## 2020-09-18 DIAGNOSIS — K5909 Other constipation: Secondary | ICD-10-CM | POA: Diagnosis not present

## 2020-09-18 DIAGNOSIS — R180 Malignant ascites: Secondary | ICD-10-CM

## 2020-09-18 DIAGNOSIS — R634 Abnormal weight loss: Secondary | ICD-10-CM | POA: Diagnosis not present

## 2020-09-18 DIAGNOSIS — M545 Low back pain, unspecified: Secondary | ICD-10-CM

## 2020-09-18 DIAGNOSIS — K5900 Constipation, unspecified: Secondary | ICD-10-CM | POA: Diagnosis not present

## 2020-09-18 DIAGNOSIS — R630 Anorexia: Secondary | ICD-10-CM | POA: Diagnosis not present

## 2020-09-18 NOTE — Telephone Encounter (Signed)
Attempted to call and give appt details for 3/30 at 1:45 pm with Dr. Alvy Bimler. Phone busy. Will attempt to call later.

## 2020-09-18 NOTE — Assessment & Plan Note (Signed)
She has lost 20 pounds in 2 weeks due to poor oral intake and anorexia I spent the bulk of today's discussion of recommending frequent small meals and to not put any kind of dietary restriction on the type of food It appears that the patient's husband is unreliable; he claimed that my nursing staff told him that the patient cannot eat chicken and my nursing staff stated that has not been the case Repeatedly, he wants me to give suggestions I gave him several suggestions which the patient agreed I recommend he start calorie count Previously, dexamethasone is not helpful and I am concerned that could cause fluid retention, worsening ascites and muscle weakness

## 2020-09-18 NOTE — Assessment & Plan Note (Signed)
We had significant goals of care discussion in the past Her daughter, [name] is able to give a clear picture of what happens at home Both the patient and husband can be unreliable in terms of following medical direction and recommendations They are also quite reluctant for any of their daughters to be present through the clinic visits Overall, her prognosis is poor With the progressive decline, I felt that it is appropriate to recommend palliative care with hospice but the patient and her husband will not accept that at this point in time I will continue to see her weekly for supportive care in appreciate her presence today and would like to continue future discussion in the presence of one of her daughters if possible

## 2020-09-18 NOTE — Assessment & Plan Note (Signed)
She has mild constipation; recently, she had 4 loose stool She will continue laxatives as prescribed

## 2020-09-18 NOTE — Progress Notes (Signed)
[name]  Patient Care Team: [name], MD as PCP - General (Family Medicine) [name], MD as Consulting Physician (Cardiology)  ASSESSMENT & PLAN:  Ovarian ca [facility]) She has primary progressive disease She continues to have significant decline overall At present time, given her poor performance status, she is not a candidate to pursue further palliative chemotherapy The patient and husband is reluctant to discuss palliative care I will continue to see her weekly for supportive care I recommend one of her daughters to be present during future visits due to unreliability and noncompliance between the patient and her husband in following medical advice and recommendations  Weight loss, abnormal She has lost 20 pounds in 2 weeks due to poor oral intake and anorexia I spent the bulk of today's discussion of recommending frequent small meals and to not put any kind of dietary restriction on the type of food It appears that the patient's husband is unreliable; he claimed that my nursing staff told him that the patient cannot eat chicken and my nursing staff stated that has not been the case Repeatedly, he wants me to give suggestions I gave him several suggestions which the patient agreed I recommend he start calorie count Previously, dexamethasone is not helpful and I am concerned that could cause fluid retention, worsening ascites and muscle weakness   Malignant ascites She has persistent ascites She is not symptomatic I do not recommend therapeutic paracentesis  Lower back pain She claimed that the hospital bed has caused back pain At home, she has been staying downstairs on the couch or recliner I recommend the family to bring a bed downstairs so that she can sleep reliably on the bed and not rely on couch or recliner that could exacerbate pain We discussed pain management including acetaminophen for mild pain and oxycodone for severe  pain  Constipation She has mild constipation; recently, she had 4 loose stool She will continue laxatives as prescribed  Generalized weakness She has profound generalized weakness likely secondary to progression of disease, poor sleep, poor oral intake and progressive weight loss The patient is not strong enough to benefit from physical therapy I recommend up and about as tolerated at home We have extensive discussions about the importance of getting good sleep  Goals of care, counseling/discussion We had significant goals of care discussion in the past Her daughter, [name] is able to give a clear picture of what happens at home Both the patient and husband can be unreliable in terms of following medical direction and recommendations They are also quite reluctant for any of their daughters to be present through the clinic visits Overall, her prognosis is poor With the progressive decline, I felt that it is appropriate to recommend palliative care with hospice but the patient and her husband will not accept that at this point in time I will continue to see her weekly for supportive care in appreciate her presence today and would like to continue future discussion in the presence of one of her daughters if possible   No orders of the defined types were placed in this encounter.   All questions were answered. The patient knows to call the clinic with any problems, questions or concerns. The total time spent in the appointment was 80 minutes encounter with patients including review of chart and various tests results, discussions about plan of care and coordination of care plan   [name], MD 09/18/2020 2:58 PM  INTERVAL HISTORY: Please  see below for problem oriented charting. Her appointment is moved sooner than Friday to address multiple questions from the husband He has been calling my office many times to ask for dietary recommendations which we have discussed in the hospital  In  the first half of the conversation, her daughter, [name] was present via telephone conference.  In the second half of the conversation, her other sister, [name] was present physically The patient's husband is somewhat reluctant to have any of the daughters present  We spent over an hour and 20 minutes with the patient and family today to discuss plan of care Since she has left the hospital, she has not been feeling hungry She start to eat a variety of food according to her husband but he did not document actual calorie intake He repeatedly asked me to give specific suggestion of what type of food she should be eating He also stated incorrectly that my nursing staff told him not to give her grilled chicken; my nursing staff did not agree with his statement  She feels tired all the time and weak Her sleep is difficult due to persistent back pain which she blames on the hospital bed; she has not been taking oxycodone or acetaminophen for this Her bedroom is upstairs which she have difficulty negotiating stairs and she has been lying on the couch downstairs  She had some constipation but a few days ago, had 4 small bowel movement She denies nausea or vomiting She denies abdominal pain The patient's husband specifically requested that I have the touch her forearm and address her superficial thrombophlebitis.  I have discussed this with the hospitalist last week. I have noted that she had ultrasound which confirmed superficial thrombophlebitis.  Her husband will not be satisfied taking her home without me touching her arm While we were having conversation, the patient felt tired and stated she wants to go home.  He immediately wheeled her out of the room even though we have not finished her discussion about plan of care I continue another 20 minutes of discussion with [name] after they have left  SUMMARY OF ONCOLOGIC HISTORY: Oncology History  Ovarian ca ([place])  07/22/2020 Imaging   Ct abdomen and  pelvis 1. Large calcified mass in the pelvis that is new from 2010. There is associated large volume ascites with mild peritoneal nodularity/calcification, primarily worrisome for a calcifying ovarian malignancy. 2. Small right pleural effusion with few calcifications in the anterior juxta diaphragmatic space, possibly related to #1. 3. Nonenlarged but calcified periaortic lymph nodes which are also worrisome for spread of disease.   07/29/2020 Procedure   Successful ultrasound-guided diagnostic and therapeutic paracentesis yielding 5 liters of peritoneal fluid.   07/29/2020 Tumor Marker   Patient's tumor was tested for the following markers: CA-125 Results of the tumor marker test revealed 771   07/30/2020 Initial Diagnosis   Ovarian ca ([place])   07/31/2020 Imaging   1. Calcified periesophageal and juxtadiaphragmatic nodules are indicative of metastatic disease. 2. Small right and trace left pleural effusions. 3. Ascites and calcified upper abdominal adenopathy/peritoneal nodules, better assessed on 07/22/2020. 4. Aortic atherosclerosis (ICD10-I70.0). Coronary artery calcification.   07/31/2020 Cancer Staging   Staging form: Ovary, Fallopian Tube, and Primary Peritoneal Carcinoma, AJCC 8th Edition - Clinical stage from 07/31/2020: Stage IV (cT3, cN1, cM1) - Signed by [name], MD on 07/31/2020 Stage prefix: Initial diagnosis   08/01/2020 Tumor Marker   Patient's tumor was tested for the following markers: CA-125 Results of the tumor  marker test revealed 641   08/05/2020 -  Chemotherapy    Patient is on Treatment Plan: OVARIAN CARBOPLATIN (AUC 6) / PACLITAXEL (175) Q21D X 6 CYCLES      08/26/2020 Tumor Marker   Patient's tumor was tested for the following markers: CA-125 Results of the tumor marker test revealed 663.   09/03/2020 Procedure   Successful ultrasound-guided therapeutic paracentesis yielding 5 liters of peritoneal fluid.   09/09/2020 - 09/14/2020 Hospital Admission   She was  admitted to the hospital for management of subacute bowel obstruction   09/10/2020 Imaging   A total of approximately 2.9 L of clear yellow fluid was removed.   09/11/2020 Imaging   1. Small bowel obstruction with a transition zone seen within the posterolateral aspect of the left lower quadrant. 2. Large, stable calcified pelvic mass consistent with the patient's known history of ovarian cancer. 3. Mild-to-moderate amount of free fluid within the abdomen and pelvis. 4. Stable hepatic and left renal cysts. 5. Total right hip replacement. 6. Approximately 5 mm anterolisthesis of the L5 vertebral body on S1.   09/13/2020 Imaging   B/L venous Doppler US Right:  No evidence of deep vein thrombosis in the upper extremity. Findings consistent with acute superficial vein thrombosis involving the right forearm portion of the basilic vein.     Left:  No evidence of thrombosis in the subclavian.     REVIEW OF SYSTEMS:   Eyes: Denies blurriness of vision Ears, nose, mouth, throat, and face: Denies mucositis or sore throat Respiratory: Denies cough, dyspnea or wheezes Cardiovascular: Denies palpitation, chest discomfort or lower extremity swelling Lymphatics: Denies new lymphadenopathy or easy bruising Behavioral/Psych: Mood is stable, no new changes  All other systems were reviewed with the patient and are negative.  I have reviewed the past medical history, past surgical history, social history and family history with the patient and they are unchanged from previous Daniel.  ALLERGIES:  is allergic to cephalexin, hydrocodone-guaifenesin, tape, and tramadol hcl.  MEDICATIONS:  Current Outpatient Medications  Medication Sig Dispense Refill  . feeding supplement (ENSURE ENLIVE / ENSURE PLUS) LIQD Take 237 mLs by mouth 3 (three) times daily between meals for 7 days. 4977 mL 0  . magnesium oxide (MAG-OX) 400 MG tablet Take 400 mg by mouth daily.    . Multiple Vitamin (MULTI-VITAMINS) TABS Take 1  tablet by mouth daily.    . ondansetron (ZOFRAN) 8 MG tablet Take 1 tablet (8 mg total) by mouth every 8 (eight) hours as needed for refractory nausea / vomiting. Start on day 3 after carboplatin chemo. (Patient not taking: No sig reported) 30 tablet 1  . oxyCODONE (ROXICODONE) 5 MG immediate release tablet Take 1 tablet (5 mg total) by mouth every 4 (four) hours as needed for severe pain. 6 tablet 0  . polyethylene glycol (MIRALAX / GLYCOLAX) 17 g packet Take 17 g by mouth daily as needed for severe constipation. 14 each 0  . pravastatin (PRAVACHOL) 20 MG tablet Take 1 tablet (20 mg total) by mouth daily. (Patient not taking: No sig reported) 90 tablet 3  . prochlorperazine (COMPAZINE) 10 MG tablet Take 1 tablet (10 mg total) by mouth every 6 (six) hours as needed (Nausea or vomiting). 5 tablet 0   No current facility-administered medications for this visit.    PHYSICAL EXAMINATION: ECOG PERFORMANCE STATUS: 3 - Symptomatic, >50% confined to bed  Vitals:   09/18/20 1323  BP: (!) 143/67  Pulse: 83  Resp: 18  Temp: 97.7  F (36.5 C)  SpO2: 100%   Filed Weights   09/18/20 1323  Weight: 125 lb 9.6 oz (57 kg)    GENERAL:alert, no distress and comfortable.  She looks thin, frail and cachectic ABDOMEN:abdomen is distended Musculoskeletal:no cyanosis of digits and no clubbing.  She has palpable cord on the right forearm consistent with superficial thrombophlebitis NEURO: alert & oriented x 3 with fluent speech, no focal motor/sensory deficits  LABORATORY DATA:  I have reviewed the data as listed    Component Value Date/Time   NA 135 09/14/2020 0542   NA 141 05/29/2019 1143   K 3.7 09/14/2020 0542   CL 100 09/14/2020 0542   CO2 24 09/14/2020 0542   GLUCOSE 120 (H) 09/14/2020 0542   BUN 25 (H) 09/14/2020 0542   BUN 29 (H) 05/29/2019 1143   CREATININE 0.73 09/14/2020 0542   CREATININE 0.82 08/26/2020 1015   CALCIUM 8.3 (L) 09/14/2020 0542   PROT 5.3 (L) 09/14/2020 0542   ALBUMIN  2.8 (L) 09/14/2020 0542   AST 34 09/14/2020 0542   AST 15 08/26/2020 1015   ALT 80 (H) 09/14/2020 0542   ALT 13 08/26/2020 1015   ALKPHOS 107 09/14/2020 0542   BILITOT 1.0 09/14/2020 0542   BILITOT 0.3 08/26/2020 1015   GFRNONAA >60 09/14/2020 0542   GFRNONAA >60 08/26/2020 1015   GFRAA 74 05/29/2019 1143    No results found for: SPEP, UPEP  Lab Results  Component Value Date   WBC 7.0 09/14/2020   NEUTROABS 1.2 (L) 09/09/2020   HGB 11.2 (L) 09/14/2020   HCT 33.6 (L) 09/14/2020   MCV 84.8 09/14/2020   PLT 192 09/14/2020      Chemistry      Component Value Date/Time   NA 135 09/14/2020 0542   NA 141 05/29/2019 1143   K 3.7 09/14/2020 0542   CL 100 09/14/2020 0542   CO2 24 09/14/2020 0542   BUN 25 (H) 09/14/2020 0542   BUN 29 (H) 05/29/2019 1143   CREATININE 0.73 09/14/2020 0542   CREATININE 0.82 08/26/2020 1015      Component Value Date/Time   CALCIUM 8.3 (L) 09/14/2020 0542   ALKPHOS 107 09/14/2020 0542   AST 34 09/14/2020 0542   AST 15 08/26/2020 1015   ALT 80 (H) 09/14/2020 0542   ALT 13 08/26/2020 1015   BILITOT 1.0 09/14/2020 0542   BILITOT 0.3 08/26/2020 1015

## 2020-09-18 NOTE — Assessment & Plan Note (Signed)
She has profound generalized weakness likely secondary to progression of disease, poor sleep, poor oral intake and progressive weight loss The patient is not strong enough to benefit from physical therapy I recommend up and about as tolerated at home We have extensive discussions about the importance of getting good sleep

## 2020-09-18 NOTE — Assessment & Plan Note (Signed)
She has persistent ascites She is not symptomatic I do not recommend therapeutic paracentesis

## 2020-09-18 NOTE — Assessment & Plan Note (Signed)
She claimed that the hospital bed has caused back pain At home, she has been staying downstairs on the couch or recliner I recommend the family to bring a bed downstairs so that she can sleep reliably on the bed and not rely on couch or recliner that could exacerbate pain We discussed pain management including acetaminophen for mild pain and oxycodone for severe pain

## 2020-09-18 NOTE — Telephone Encounter (Signed)
Called [name] (daughter) to see if she can attend [name]'s apt with Dr. Alvy Bimler today.  She said she won't be able to since her daughter is arriving today.  She patched in her sister [name] to the call and [name] is going to try to attend.

## 2020-09-18 NOTE — Assessment & Plan Note (Addendum)
She has primary progressive disease She continues to have significant decline overall At present time, given her poor performance status, she is not a candidate to pursue further palliative chemotherapy The patient and husband is reluctant to discuss palliative care I will continue to see her weekly for supportive care I recommend one of her daughters to be present during future visits due to unreliability and noncompliance between the patient and her husband in following medical advice and recommendations

## 2020-09-19 DIAGNOSIS — R630 Anorexia: Secondary | ICD-10-CM | POA: Diagnosis not present

## 2020-09-19 DIAGNOSIS — R112 Nausea with vomiting, unspecified: Secondary | ICD-10-CM | POA: Diagnosis not present

## 2020-09-19 DIAGNOSIS — Z09 Encounter for follow-up examination after completed treatment for conditions other than malignant neoplasm: Secondary | ICD-10-CM | POA: Diagnosis not present

## 2020-09-19 DIAGNOSIS — R109 Unspecified abdominal pain: Secondary | ICD-10-CM | POA: Diagnosis not present

## 2020-09-20 ENCOUNTER — Ambulatory Visit: Payer: Medicare Other | Admitting: Hematology and Oncology

## 2020-09-20 NOTE — Telephone Encounter (Signed)
Called and given appt details for 3/30 to husband. He verbalized understanding.

## 2020-09-23 ENCOUNTER — Telehealth: Payer: Self-pay

## 2020-09-23 ENCOUNTER — Telehealth: Payer: Self-pay | Admitting: Hematology and Oncology

## 2020-09-23 NOTE — Telephone Encounter (Signed)
Husband called and left a message to call him.  Called back. He would like to change appt on Wednesday at 12 noon with Dietician to a phone call due his wife will not be able to sit that long with both apps. Sent message to [employer].

## 2020-09-23 NOTE — Telephone Encounter (Signed)
Scheduled appt per 3/25 sch msg. Pt aware.  

## 2020-09-25 ENCOUNTER — Telehealth: Payer: Self-pay | Admitting: Oncology

## 2020-09-25 ENCOUNTER — Encounter: Payer: Self-pay | Admitting: Hematology and Oncology

## 2020-09-25 ENCOUNTER — Other Ambulatory Visit: Payer: Self-pay

## 2020-09-25 ENCOUNTER — Inpatient Hospital Stay: Payer: Medicare Other | Admitting: Nutrition

## 2020-09-25 ENCOUNTER — Inpatient Hospital Stay (HOSPITAL_BASED_OUTPATIENT_CLINIC_OR_DEPARTMENT_OTHER): Payer: Medicare Other | Admitting: Hematology and Oncology

## 2020-09-25 VITALS — BP 108/61 | HR 73 | Temp 97.0°F | Resp 18 | Ht 61.0 in | Wt 129.4 lb

## 2020-09-25 DIAGNOSIS — Z7189 Other specified counseling: Secondary | ICD-10-CM

## 2020-09-25 DIAGNOSIS — C569 Malignant neoplasm of unspecified ovary: Secondary | ICD-10-CM

## 2020-09-25 DIAGNOSIS — R634 Abnormal weight loss: Secondary | ICD-10-CM | POA: Diagnosis not present

## 2020-09-25 DIAGNOSIS — R630 Anorexia: Secondary | ICD-10-CM | POA: Diagnosis not present

## 2020-09-25 DIAGNOSIS — R180 Malignant ascites: Secondary | ICD-10-CM

## 2020-09-25 DIAGNOSIS — R5381 Other malaise: Secondary | ICD-10-CM | POA: Insufficient documentation

## 2020-09-25 DIAGNOSIS — K5900 Constipation, unspecified: Secondary | ICD-10-CM | POA: Diagnosis not present

## 2020-09-25 DIAGNOSIS — R0981 Nasal congestion: Secondary | ICD-10-CM | POA: Diagnosis not present

## 2020-09-25 DIAGNOSIS — R531 Weakness: Secondary | ICD-10-CM | POA: Diagnosis not present

## 2020-09-25 NOTE — Assessment & Plan Note (Signed)
She has malignant cachexia I do not recommend dexamethasone due to risk of side effects outweighs the benefit She has seen her primary care doctor who recommended Megace I am concerned about the risk of blood clot I recommend she continues her oral intake as tolerated She has done well over the past week without additional help from appetite stimulant

## 2020-09-25 NOTE — Assessment & Plan Note (Signed)
She has no further signs or symptoms of bowel obstruction She is not symptomatic from ascites Her performance status has improved since a week ago She is interested to pursue further chemotherapy Repeatedly, the patient stated she wants to be cured With recent lack of response to treatment, expected benefit from second line chemotherapy is in the region of 20% or so The likelihood of cure is remote at best  We have a long discussion with the patient and family and I inquired her desire whether she wants me to continue treatment with me being her oncologist The patient ruminates over our process discussions a lot In the past, the patient was not eating, drinking, sleeping, weak and with uncontrolled pain Today, after a week, she is eating better, maintaining weight, sleeping better, not in pain and getting stronger I felt that the patient can benefit from further treatment  Ultimately, she is in agreement to proceed with further chemotherapy I recommend second line treatment with gemcitabine I discussed the risk, benefits, side effects with patient and family including risk of pancytopenia, infection, the need for transfusion and further weakness and she is in agreement to proceed We will get her started on the week of April 11

## 2020-09-25 NOTE — Progress Notes (Signed)
DISCONTINUE ON PATHWAY REGIMEN - Ovarian     A cycle is every 21 days:     Paclitaxel      Carboplatin   **Always confirm dose/schedule in your pharmacy ordering system**  REASON: Disease Progression PRIOR TREATMENT: [id number]: Carboplatin AUC=6 + Paclitaxel 175 mg/m2 q21 Days x 2-4 Cycles TREATMENT RESPONSE: Progressive Disease (PD)  START OFF PATHWAY REGIMEN - Ovarian   [id number]:Gemcitabine 1,000 mg/m2 D1, 8  q21 Days:   A cycle is every 21 days:     Gemcitabine   **Always confirm dose/schedule in your pharmacy ordering system**  Patient Characteristics: Recurrent or Progressive Disease, Second Line, Progression on Neoadjuvant Therapy BRCA Mutation Status: Awaiting Test Results Therapeutic Status: Recurrent or Progressive Disease Line of Therapy: Second Line  Intent of Therapy: Non-Curative / Palliative Intent, Discussed with Patient 

## 2020-09-25 NOTE — Telephone Encounter (Signed)
Called [name] and [place] regarding appointment today with Dr. Alvy Bimler.  [name] is going to attend the appointment.  Advised her to try to get here early.  Erline Levine said she will not be attending but will talk to [place] about it.

## 2020-09-25 NOTE — Patient Instructions (Signed)
Gemcitabine injection What is this medicine? GEMCITABINE (jem SYE ta been) is a chemotherapy drug. This medicine is used to treat many types of cancer like breast cancer, lung cancer, pancreatic cancer, and ovarian cancer. This medicine may be used for other purposes; ask your health care provider or pharmacist if you have questions. COMMON BRAND NAME(S): Gemzar, Infugem What should I tell my health care provider before I take this medicine? They need to know if you have any of these conditions:  blood disorders  infection  kidney disease  liver disease  lung or breathing disease, like asthma  recent or ongoing radiation therapy  an unusual or allergic reaction to gemcitabine, other chemotherapy, other medicines, foods, dyes, or preservatives  pregnant or trying to get pregnant  breast-feeding How should I use this medicine? This drug is given as an infusion into a vein. It is administered in a hospital or clinic by a specially trained health care professional. Talk to your pediatrician regarding the use of this medicine in children. Special care may be needed. Overdosage: If you think you have taken too much of this medicine contact a poison control center or emergency room at once. NOTE: This medicine is only for you. Do not share this medicine with others. What if I miss a dose? It is important not to miss your dose. Call your doctor or health care professional if you are unable to keep an appointment. What may interact with this medicine?  medicines to increase blood counts like filgrastim, pegfilgrastim, sargramostim  some other chemotherapy drugs like cisplatin  vaccines Talk to your doctor or health care professional before taking any of these medicines:  acetaminophen  aspirin  ibuprofen  ketoprofen  naproxen This list may not describe all possible interactions. Give your health care provider a list of all the medicines, herbs, non-prescription drugs, or  dietary supplements you use. Also tell them if you smoke, drink alcohol, or use illegal drugs. Some items may interact with your medicine. What should I watch for while using this medicine? Visit your doctor for checks on your progress. This drug may make you feel generally unwell. This is not uncommon, as chemotherapy can affect healthy cells as well as cancer cells. Report any side effects. Continue your course of treatment even though you feel ill unless your doctor tells you to stop. In some cases, you may be given additional medicines to help with side effects. Follow all directions for their use. Call your doctor or health care professional for advice if you get a fever, chills or sore throat, or other symptoms of a cold or flu. Do not treat yourself. This drug decreases your body's ability to fight infections. Try to avoid being around people who are sick. This medicine may increase your risk to bruise or bleed. Call your doctor or health care professional if you notice any unusual bleeding. Be careful brushing and flossing your teeth or using a toothpick because you may get an infection or bleed more easily. If you have any dental work done, tell your dentist you are receiving this medicine. Avoid taking products that contain aspirin, acetaminophen, ibuprofen, naproxen, or ketoprofen unless instructed by your doctor. These medicines may hide a fever. Do not become pregnant while taking this medicine or for 6 months after stopping it. Women should inform their doctor if they wish to become pregnant or think they might be pregnant. Men should not father a child while taking this medicine and for 3 months after stopping it.   There is a potential for serious side effects to an unborn child. Talk to your health care professional or pharmacist for more information. Do not breast-feed an infant while taking this medicine or for at least 1 week after stopping it. Men should inform their doctors if they wish  to father a child. This medicine may lower sperm counts. Talk with your doctor or health care professional if you are concerned about your fertility. What side effects may I notice from receiving this medicine? Side effects that you should report to your doctor or health care professional as soon as possible:  allergic reactions like skin rash, itching or hives, swelling of the face, lips, or tongue  breathing problems  pain, redness, or irritation at site where injected  signs and symptoms of a dangerous change in heartbeat or heart rhythm like chest pain; dizziness; fast or irregular heartbeat; palpitations; feeling faint or lightheaded, falls; breathing problems  signs of decreased platelets or bleeding - bruising, pinpoint red spots on the skin, black, tarry stools, blood in the urine  signs of decreased red blood cells - unusually weak or tired, feeling faint or lightheaded, falls  signs of infection - fever or chills, cough, sore throat, pain or difficulty passing urine  signs and symptoms of kidney injury like trouble passing urine or change in the amount of urine  signs and symptoms of liver injury like dark yellow or brown urine; general ill feeling or flu-like symptoms; light-colored stools; loss of appetite; nausea; right upper belly pain; unusually weak or tired; yellowing of the eyes or skin  swelling of ankles, feet, hands Side effects that usually do not require medical attention (report to your doctor or health care professional if they continue or are bothersome):  constipation  diarrhea  hair loss  loss of appetite  nausea  rash  vomiting This list may not describe all possible side effects. Call your doctor for medical advice about side effects. You may report side effects to FDA at 1-800-FDA-1088. Where should I keep my medicine? This drug is given in a hospital or clinic and will not be stored at home. NOTE: This sheet is a summary. It may not cover all  possible information. If you have questions about this medicine, talk to your doctor, pharmacist, or health care provider.  2021 Elsevier/Gold Standard (2017-09-08 18:06:11)  

## 2020-09-25 NOTE — Assessment & Plan Note (Signed)
She had persistent ascites but not symptomatic Observe closely for now; she does not need paracentesis

## 2020-09-25 NOTE — Progress Notes (Signed)
Patient requested telephone consult.  80 year old female diagnosed with ovarian cancer and followed by Dr. Alvy Bimler.  Past medical history includes pancreatitis, constipation, atrial fibrillation.  Medications include magnesium oxide, multivitamin, Zofran, MiraLAX, and Compazine.  Labs include glucose 120, BUN 25, albumin 2.8.  Height: 5 feet 1 inches. Weight: 125.6 pounds. Usual body weight: 150 pounds in February 2022. BMI: 23.73.  Estimated nutrition needs: 1800-2000 cal, 90-100 g protein, 2.0 L fluid.  Patient and husband expressed concern regarding patient's weight loss.  They are trying to add high-calorie foods but do not understand how to do this.  They would like specific examples of snacks.  Patient has poor appetite and early satiety.  Denies nausea and vomiting.  Patient having regular bowel movements.  She is drinking a chocolate shake providing 230 cal and about 40 g of protein.  She is open to trying some additional oral nutrition drinks.  She has been cleared for a regular diet.  Nutrition diagnosis: Unintended weight loss related to ovarian cancer and associated treatments as evidenced by 16% weight loss over 1 month.  This is significant.  Patient likely with moderate to severe protein calorie malnutrition.  Intervention: Educated to consume smaller more frequent meals and snacks with high-calorie, high-protein foods 6 times daily. Reviewed high-calorie, high-protein foods.  Made snack suggestions. Provided samples of oral nutrition supplements to be given to patient at visit.  Encouraged 2-3 cartons daily.  Also provided coupons. Provided education fact sheets on high-calorie high-protein foods, soft protein foods, snack ideas, and recipes. Encouraged foods only as tolerated. Questions were answered.  Teach back method used.  Contact information provided.  Monitoring, evaluation, goals: Patient will tolerate increased calories and protein to minimize further weight  loss.  Next visit: To be scheduled as needed.  Patient has contact information.  **Disclaimer: This note was dictated with voice recognition software. Similar sounding words can inadvertently be transcribed and this note may contain transcription errors which may not have been corrected upon publication of note.**

## 2020-09-25 NOTE — Assessment & Plan Note (Signed)
Her performance status is improved I recommend referral to outpatient therapy and rehab

## 2020-09-25 NOTE — Assessment & Plan Note (Signed)
I have a very long discussion with the patient and family The patient appears to have unrealistic expectation and repeatedly mention about the word of cure and feeling better with treatment Repeatedly, I told the patient the likelihood of cure is remote at best The chances of second line of treatment is 20%, with expected benefit in terms of stability and not likely to have resolution of disease unless she is a candidate for future surgery I told the patient that chemotherapy will likely create complications that could jeopardize her health I have asked the patient and family to refrain seeking multiple opinions by different physicians because it would complicate her care

## 2020-09-25 NOTE — Progress Notes (Signed)
[facility] OFFICE PROGRESS NOTE  Patient Care Team: [name], MD as PCP - General (Family Medicine) [name], MD as Consulting Physician (Cardiology)  ASSESSMENT & PLAN:  Ovarian ca [facility]) She has no further signs or symptoms of bowel obstruction She is not symptomatic from ascites Her performance status has improved since a week ago She is interested to pursue further chemotherapy Repeatedly, the patient stated she wants to be cured With recent lack of response to treatment, expected benefit from second line chemotherapy is in the region of 20% or so The likelihood of cure is remote at best  We have a long discussion with the patient and family and I inquired her desire whether she wants me to continue treatment with me being her oncologist The patient ruminates over our process discussions a lot In the past, the patient was not eating, drinking, sleeping, weak and with uncontrolled pain Today, after a week, she is eating better, maintaining weight, sleeping better, not in pain and getting stronger I felt that the patient can benefit from further treatment  Ultimately, she is in agreement to proceed with further chemotherapy I recommend second line treatment with gemcitabine I discussed the risk, benefits, side effects with patient and family including risk of pancytopenia, infection, the need for transfusion and further weakness and she is in agreement to proceed We will get her started on the week of April 11  Malignant ascites She had persistent ascites but not symptomatic Observe closely for now; she does not need paracentesis  Weight loss, abnormal She has malignant cachexia I do not recommend dexamethasone due to risk of side effects outweighs the benefit She has seen her primary care doctor who recommended Megace I am concerned about the risk of blood clot I recommend she continues her oral intake as tolerated She has done well over the past  week without additional help from appetite stimulant  Physical debility Her performance status is improved I recommend referral to outpatient therapy and rehab  Goals of care, counseling/discussion I have a very long discussion with the patient and family The patient appears to have unrealistic expectation and repeatedly mention about the word of cure and feeling better with treatment Repeatedly, I told the patient the likelihood of cure is remote at best The chances of second line of treatment is 20%, with expected benefit in terms of stability and not likely to have resolution of disease unless she is a candidate for future surgery I told the patient that chemotherapy will likely create complications that could jeopardize her health I have asked the patient and family to refrain seeking multiple opinions by different physicians because it would complicate her care   Orders Placed This Encounter  Procedures  . CBC with Differential (Cancer Center Only)    Standing Status:   Standing    Number of Occurrences:   20    Standing Expiration Date:   09/25/2021  . CMP ([facility] only)    Standing Status:   Standing    Number of Occurrences:   20    Standing Expiration Date:   09/25/2021  . Ambulatory referral to Physical Therapy    Referral Priority:   Routine    Referral Type:   Physical Medicine    Referral Reason:   Specialty Services Required    Requested Specialty:   Physical Therapy    Number of Visits Requested:   1    All questions were answered. The patient knows to  call the clinic with any problems, questions or concerns. The total time spent in the appointment was 70 minutes encounter with patients including review of chart and various tests results, discussions about plan of care and coordination of care plan   [name], MD 09/25/2020 3:12 PM  INTERVAL HISTORY: Please see below for problem oriented charting. She returns with her husband and daughter for further  follow-up She has not lost weight since last time I saw her Her pain is well controlled She sleep better She is able to maintain approximately 1500 kcal/day She has no nausea Her bowels are moving normal Ascites is not bothering her She is able to walk up stairs without difficulties Her husband has left several messages to GYN surgeons office, requesting nurse practitioner to tell me to prescribe chemotherapy and have left messages demanded return phone calls Recently, he has requested dietitian consult but then later stated that the dietitian consult was forced on him and it was way too long In the past, he has requested dexamethasone as appetite stimulant and I told the patient and family the rationale of not prescribing it.  He saw his primary care doctor who has prescribed Megace She has not started taking Megace yet  SUMMARY OF ONCOLOGIC HISTORY: Oncology History  Ovarian ca ([place])  07/22/2020 Imaging   Ct abdomen and pelvis 1. Large calcified mass in the pelvis that is new from 2010. There is associated large volume ascites with mild peritoneal nodularity/calcification, primarily worrisome for a calcifying ovarian malignancy. 2. Small right pleural effusion with few calcifications in the anterior juxta diaphragmatic space, possibly related to #1. 3. Nonenlarged but calcified periaortic lymph nodes which are also worrisome for spread of disease.   07/29/2020 Procedure   Successful ultrasound-guided diagnostic and therapeutic paracentesis yielding 5 liters of peritoneal fluid.   07/29/2020 Tumor Marker   Patient's tumor was tested for the following markers: CA-125 Results of the tumor marker test revealed 771   07/30/2020 Initial Diagnosis   Ovarian ca ([place])   07/31/2020 Imaging   1. Calcified periesophageal and juxtadiaphragmatic nodules are indicative of metastatic disease. 2. Small right and trace left pleural effusions. 3. Ascites and calcified upper abdominal  adenopathy/peritoneal nodules, better assessed on 07/22/2020. 4. Aortic atherosclerosis (ICD10-I70.0). Coronary artery calcification.   07/31/2020 Cancer Staging   Staging form: Ovary, Fallopian Tube, and Primary Peritoneal Carcinoma, AJCC 8th Edition - Clinical stage from 07/31/2020: Stage IV (cT3, cN1, cM1) - Signed by [name], MD on 07/31/2020 Stage prefix: Initial diagnosis   08/01/2020 Tumor Marker   Patient's tumor was tested for the following markers: CA-125 Results of the tumor marker test revealed 641   08/05/2020 - 08/26/2020 Chemotherapy         08/26/2020 Tumor Marker   Patient's tumor was tested for the following markers: CA-125 Results of the tumor marker test revealed 663.   09/03/2020 Procedure   Successful ultrasound-guided therapeutic paracentesis yielding 5 liters of peritoneal fluid.   09/09/2020 - 09/14/2020 Hospital Admission   She was admitted to the hospital for management of subacute bowel obstruction   09/10/2020 Imaging   A total of approximately 2.9 L of clear yellow fluid was removed.   09/11/2020 Imaging   1. Small bowel obstruction with a transition zone seen within the posterolateral aspect of the left lower quadrant. 2. Large, stable calcified pelvic mass consistent with the patient's known history of ovarian cancer. 3. Mild-to-moderate amount of free fluid within the abdomen and pelvis. 4. Stable hepatic and  left renal cysts. 5. Total right hip replacement. 6. Approximately 5 mm anterolisthesis of the L5 vertebral body on S1.   09/13/2020 Imaging   B/L venous Doppler US Right:  No evidence of deep vein thrombosis in the upper extremity. Findings consistent with acute superficial vein thrombosis involving the right forearm portion of the basilic vein.     Left:  No evidence of thrombosis in the subclavian.   10/07/2020 -  Chemotherapy    Patient is on Treatment Plan: OVARIAN GEMCITABINE D1,8 Q21D        REVIEW OF SYSTEMS:   Constitutional: Denies  fevers, chills or abnormal weight loss Eyes: Denies blurriness of vision Ears, nose, mouth, throat, and face: Denies mucositis or sore throat Respiratory: Denies cough, dyspnea or wheezes Cardiovascular: Denies palpitation, chest discomfort or lower extremity swelling Gastrointestinal:  Denies nausea, heartburn or change in bowel habits Skin: Denies abnormal skin rashes Lymphatics: Denies new lymphadenopathy or easy bruising Behavioral/Psych: Mood is stable, no new changes  All other systems were reviewed with the patient and are negative.  I have reviewed the past medical history, past surgical history, social history and family history with the patient and they are unchanged from previous note.  ALLERGIES:  is allergic to cephalexin, hydrocodone-guaifenesin, tape, and tramadol hcl.  MEDICATIONS:  Current Outpatient Medications  Medication Sig Dispense Refill  . magnesium oxide (MAG-OX) 400 MG tablet Take 400 mg by mouth daily.    . Multiple Vitamin (MULTI-VITAMINS) TABS Take 1 tablet by mouth daily.    . ondansetron (ZOFRAN) 8 MG tablet Take 1 tablet (8 mg total) by mouth every 8 (eight) hours as needed for refractory nausea / vomiting. Start on day 3 after carboplatin chemo. (Patient not taking: No sig reported) 30 tablet 1  . oxyCODONE (ROXICODONE) 5 MG immediate release tablet Take 1 tablet (5 mg total) by mouth every 4 (four) hours as needed for severe pain. 6 tablet 0  . polyethylene glycol (MIRALAX / GLYCOLAX) 17 g packet Take 17 g by mouth daily as needed for severe constipation. 14 each 0  . pravastatin (PRAVACHOL) 20 MG tablet Take 1 tablet (20 mg total) by mouth daily. (Patient not taking: No sig reported) 90 tablet 3  . prochlorperazine (COMPAZINE) 10 MG tablet Take 1 tablet (10 mg total) by mouth every 6 (six) hours as needed (Nausea or vomiting). 5 tablet 0   No current facility-administered medications for this visit.    PHYSICAL EXAMINATION: ECOG PERFORMANCE STATUS: 2 -  Symptomatic, <50% confined to bed  Vitals:   09/25/20 1336  BP: 108/61  Pulse: 73  Resp: 18  Temp: (!) 97 F (36.1 C)  SpO2: 99%   Filed Weights   09/25/20 1336  Weight: 129 lb 6.4 oz (58.7 kg)    GENERAL:alert, no distress and comfortable.  She looks thin.  Muscle wasting is noted.  Her abdomen appears distended with ascites NEURO: alert & oriented x 3 with fluent speech, no focal motor/sensory deficits  LABORATORY DATA:  I have reviewed the data as listed    Component Value Date/Time   NA 135 09/14/2020 0542   NA 141 05/29/2019 1143   K 3.7 09/14/2020 0542   CL 100 09/14/2020 0542   CO2 24 09/14/2020 0542   GLUCOSE 120 (H) 09/14/2020 0542   BUN 25 (H) 09/14/2020 0542   BUN 29 (H) 05/29/2019 1143   CREATININE 0.73 09/14/2020 0542   CREATININE 0.82 08/26/2020 1015   CALCIUM 8.3 (L) 09/14/2020 0542  PROT 5.3 (L) 09/14/2020 0542   ALBUMIN 2.8 (L) 09/14/2020 0542   AST 34 09/14/2020 0542   AST 15 08/26/2020 1015   ALT 80 (H) 09/14/2020 0542   ALT 13 08/26/2020 1015   ALKPHOS 107 09/14/2020 0542   BILITOT 1.0 09/14/2020 0542   BILITOT 0.3 08/26/2020 1015   GFRNONAA >60 09/14/2020 0542   GFRNONAA >60 08/26/2020 1015   GFRAA 74 05/29/2019 1143    No results found for: SPEP, UPEP  Lab Results  Component Value Date   WBC 7.0 09/14/2020   NEUTROABS 1.2 (L) 09/09/2020   HGB 11.2 (L) 09/14/2020   HCT 33.6 (L) 09/14/2020   MCV 84.8 09/14/2020   PLT 192 09/14/2020      Chemistry      Component Value Date/Time   NA 135 09/14/2020 0542   NA 141 05/29/2019 1143   K 3.7 09/14/2020 0542   CL 100 09/14/2020 0542   CO2 24 09/14/2020 0542   BUN 25 (H) 09/14/2020 0542   BUN 29 (H) 05/29/2019 1143   CREATININE 0.73 09/14/2020 0542   CREATININE 0.82 08/26/2020 1015      Component Value Date/Time   CALCIUM 8.3 (L) 09/14/2020 0542   ALKPHOS 107 09/14/2020 0542   AST 34 09/14/2020 0542   AST 15 08/26/2020 1015   ALT 80 (H) 09/14/2020 0542   ALT 13 08/26/2020 1015    BILITOT 1.0 09/14/2020 0542   BILITOT 0.3 08/26/2020 1015

## 2020-09-27 DIAGNOSIS — C569 Malignant neoplasm of unspecified ovary: Secondary | ICD-10-CM | POA: Diagnosis not present

## 2020-09-27 DIAGNOSIS — M533 Sacrococcygeal disorders, not elsewhere classified: Secondary | ICD-10-CM | POA: Diagnosis not present

## 2020-09-30 ENCOUNTER — Telehealth: Payer: Self-pay | Admitting: *Deleted

## 2020-09-30 ENCOUNTER — Other Ambulatory Visit: Payer: Self-pay | Admitting: Hematology and Oncology

## 2020-09-30 MED ORDER — OXYCODONE HCL 5 MG PO TABS: 5.0000 mg | ORAL_TABLET | ORAL | 0 refills | Status: DC | PRN

## 2020-09-30 NOTE — Progress Notes (Signed)
Pharmacist Chemotherapy Monitoring - Follow Up Assessment    I verify that I have reviewed each item in the below checklist:  . Regimen for the patient is scheduled for the appropriate day and plan matches scheduled date. Marland Kitchen Appropriate non-routine labs are ordered dependent on drug ordered. . If applicable, additional medications reviewed and ordered per protocol based on lifetime cumulative doses and/or treatment regimen.   Plan for follow-up and/or issues identified: No . I-vent associated with next due treatment: No . MD and/or nursing notified: No   Kennith Center, Pharm.D., CPP 09/30/2020@8 :12 AM

## 2020-09-30 NOTE — Telephone Encounter (Signed)
RNCM received call (voicemail) from pt husband requesting refill for pain Rx.  RNCM returned call, but no answer.  Will advise to contact PCP for refill as ED does not provide refill without examination.

## 2020-10-01 ENCOUNTER — Telehealth: Payer: Self-pay | Admitting: Licensed Clinical Social Worker

## 2020-10-01 NOTE — Telephone Encounter (Signed)
[place] Work  Clinical Social Work was referred by Therapist, sports for assessment of psychosocial needs per message from daughter, Erline Levine, stating that she called "regarding some services she is interested in setting up with her for her mom".    Clinical Social Worker attempted to contact daughter, Erline Levine, by phone. Left VM with direct contact information.        [place], [place] Worker [employer]

## 2020-10-02 ENCOUNTER — Encounter: Payer: Self-pay | Admitting: Physical Therapy

## 2020-10-02 ENCOUNTER — Ambulatory Visit: Payer: Medicare Other | Attending: Hematology and Oncology | Admitting: Physical Therapy

## 2020-10-02 ENCOUNTER — Other Ambulatory Visit: Payer: Self-pay

## 2020-10-02 DIAGNOSIS — R269 Unspecified abnormalities of gait and mobility: Secondary | ICD-10-CM | POA: Insufficient documentation

## 2020-10-02 DIAGNOSIS — M6281 Muscle weakness (generalized): Secondary | ICD-10-CM

## 2020-10-02 DIAGNOSIS — Z9181 History of falling: Secondary | ICD-10-CM | POA: Diagnosis not present

## 2020-10-02 NOTE — Therapy (Signed)
[place], [place], [address] Phone: [phone]   Fax:  [phone]  Physical Therapy Evaluation  Patient Details  Name: [name] MRN: [id number] Date of Birth: [birth year] Referring Provider (PT): Dr. Alvy Bimler   Encounter Date: 10/02/2020   PT End of Session - 10/02/20 2017    Visit Number 1    Number of Visits 17    Date for PT Re-Evaluation 12/02/20    PT Start Time 1200    PT Stop Time 1245    PT Time Calculation (min) 45 min    Activity Tolerance Patient limited by fatigue    Behavior During Therapy [facility] for tasks assessed/performed           Past Medical History:  Diagnosis Date  . Arthritis    right shoulder replacement, right replacement   . Atrial fibrillation ([place])   . Cancer ([place])   . Chronic pancreatitis ([place])   . Constipation   . Osteopenia   . Pancreatitis 1990s  . Syncope 03/2011    Past Surgical History:  Procedure Laterality Date  . APPENDECTOMY  1955  . CHOLECYSTECTOMY  1998  . CYSTECTOMY  1981   [facility]  . JOINT REPLACEMENT     right shoulder 2008; right hip 2016  . [place]  . TOTAL HIP ARTHROPLASTY Right 11/2014   [facility], [place], [place]    There were no vitals filed for this visit.    Subjective Assessment - 10/02/20 1204    Subjective "I'm just really tired"    Pertinent History Ovarian cancer diagnosed 2021, has had chemotherapy with 2 doses and plans to have more.Pt has recently has malignant ascites with hospitalization for possible bowel obstruction. She has had weight loss. Past history included right hip replacement 2016; right shoulder replacement 2008.    Limitations House hold activities;Walking;Lifting;Standing   Pt is tentative about doing these things   Patient Stated Goals Pt says she wants to get back to doing the things she was able to do before.    Currently in Pain? No/denies   it varies              [facility] PT Assessment - 10/02/20 0001      Assessment   Medical Diagnosis ovarian cancer    Referring Provider (PT) Dr. Alvy Bimler    Prior Therapy for right hip replacement ,      Precautions   Precautions Fall      Restrictions   Weight Bearing Restrictions No      Balance Screen   Has the patient fallen in the past 6 months Yes   pt was having trouble getting off the toilet   How many times? 1    Has the patient had a decrease in activity level because of a fear of falling?  No   pt says she does not think about falling   Is the patient reluctant to leave their home because of a fear of falling?  No      Home Environment   Living Environment Private residence    Living Arrangements Spouse/significant other    Type of [place] - 2 wheels;Cane - single point;Wheelchair - manual      Prior Function   Level of Independence Needs assistance with ADLs    Vocation Retired      Cognition   Overall Cognitive Status Within Functional Limits for tasks assessed  Observation/Other Assessments   Observations pt walks into the clinic slowly withouth a device, Husband accompanies her. She has pitting edema in both lower legs that appears to be more on the left than right.  She sometimes sits with her legs up but can't really tell that it helps with the swelling. She doesn't want to work on that right now; she wants to get back to doing the things she used to do. pt appears to have generalized muscle atrophy    Other Surveys  Quick Dash    Quick DASH  34.09      Observation/Other Assessments-Edema    Edema Circumferential   pitting in both legs below the knees     Sensation   Light Touch Not tested      Coordination   Gross Motor Movements are Fluid and Coordinated No   pt moves slowly, guarded. decreased upper body movment     Functional Tests   Functional tests Sit to Stand      Sit to Stand   Comments 5 reps of sit to stand in 30 seconds  without use of hands to push up, but pt tends to lean back on chair with back of knees      Posture/Postural Control   Posture/Postural Control Postural limitations    Postural Limitations Rounded Shoulders;Forward head      ROM / Strength   AROM / PROM / Strength AROM;Strength      AROM   Overall AROM  Deficits    Overall AROM Comments limitation in both shoulders limiteation in both ankles due to edema in ankles    AROM Assessment Site Shoulder    Right/Left Shoulder Right;Left    Right Shoulder Flexion 140 Degrees    Left Shoulder Flexion 100 Degrees      Strength   Overall Strength Deficits    Overall Strength Comments pt has good isometric strength to testing in LEs, but shows functional defecits, both shoulder are at about 3-/5 with inability to completely raise arms to available range against gravity    Strength Assessment Site Hand    Right/Left hand Right;Left    Right Hand Grip (lbs) 45/40/38    Left Hand Grip (lbs) 40/40/ 40   close to normal range for age     Palpation   Palpation comment pitting edema in lower legs      Bed Mobility   Bed Mobility Supine to Sit;Sit to Supine;Rolling Right;Rolling Left    Rolling Right Moderate Assistance - Patient 50-74%    Rolling Left Moderate Assistance - Patient 50-74%    Supine to Sit Moderate Assistance - Patient 50-74%    Sit to Supine Minimal Assistance - Patient > 75%      Ambulation/Gait   Ambulation/Gait Yes    Ambulation/Gait Assistance 6: Modified independent (Device/Increase time)   increased time   Ambulation Distance (Feet) 100 Feet    Assistive device None    Gait Pattern Step-through pattern;Decreased arm swing - right;Decreased arm swing - left;Decreased step length - right;Decreased step length - left;Decreased stance time - right;Decreased stance time - left;Decreased stride length;Decreased hip/knee flexion - right;Decreased hip/knee flexion - left;Decreased dorsiflexion - right;Decreased dorsiflexion -  left;Narrow base of support;Poor foot clearance - left;Poor foot clearance - right    Gait velocity .65 m/sec ( increased risk for fall )      Standardized Balance Assessment   Standardized Balance Assessment Timed Up and Go Test      Timed Up and  Go Test   Normal TUG (seconds) 15.51            [facility] Pre-Surgical Assessment - 10/02/20 0001    5 Meter Walk Test- trial 1 7.7 sec    Timed Up & Go Test trial  15.5 sec.    Chair to Stand # of Repetitions 5    4 Stage Balance Test tolerated for:  20 sec.    4 Stage Balance Test Position 3   able to hold stage 1 and 2 for 10 sec , needs help to get into position   comment unable to achieve or hold balance in tandem stance                  Quick Dash - 10/02/20 0001    Open a tight or new jar No difficulty    Do heavy household chores (wash walls, wash floors) Moderate difficulty    Carry a shopping bag or briefcase Mild difficulty    Wash your back Moderate difficulty    Use a knife to cut food No difficulty    Recreational activities in which you take some force or impact through your arm, shoulder, or hand (golf, hammering, tennis) Mild difficulty    During the past week, to what extent has your arm, shoulder or hand problem interfered with your normal social activities with family, friends, neighbors, or groups? Modererately    During the past week, to what extent has your arm, shoulder or hand problem limited your work or other regular daily activities Modererately    Arm, shoulder, or hand pain. Moderate    Tingling (pins and needles) in your arm, shoulder, or hand Moderate    Difficulty Sleeping Mild difficulty    DASH Score 34.09 %            Objective measurements completed on examination: See above findings.       [place] Adult PT Treatment/Exercise - 10/02/20 0001      Knee/Hip Exercises: Standing   Other Standing Knee Exercises instructed in standing wall taps with glute sets at the top x 5 reps to begin LE  activation                    PT Short Term Goals - 10/02/20 2036      PT SHORT TERM GOAL #1   Title Pt will report she is be able to do an intital HEP 5 times a week    Time 4    Period Weeks    Status New      PT SHORT TERM GOAL #2   Title Pt will be able to do 7 reps of sit to stand in 30 seconds indicting and improvment in functional strength    Baseline 5 reps  on 10/02/2020    Time 4    Period Weeks    Status New      PT SHORT TERM GOAL #3   Title Pt will be able to do a normal TUG in less than 14 seconds    Baseline 15.51 on 10/02/2020    Time 4    Period Weeks    Status New             PT Long Term Goals - 10/02/20 2038      PT LONG TERM GOAL #1   Title Pt will report she is able to get in and out of bed with minimal assit from her husband .  Baseline pt needs mod assit to get in and out of bed on 10/02/2020    Time 8    Period Weeks    Status New      PT LONG TERM GOAL #2   Title Pt will be able to do 10 repetitions of sit to stand in 30 seconds    Baseline 5 reps on 10/02/2020    Time 8    Period Weeks    Status New      PT LONG TERM GOAL #3   Title Pt will increase gait speed to .8 meters per second demonstrating a decreased risk for fall compared to baseline    Baseline .65 meters per second on 10/01/2020    Time 8    Period Weeks    Status New      PT LONG TERM GOAL #4   Title Pt will have a home exercise program that she can follow through with on her own at home.    Time 8    Period Weeks    Status New                  Plan - 10/02/20 2018    Clinical Impression Statement Pt comes to PT with fatigue and deconditioning with diagnosis of ovarian cacner with malignant ascites.  She is undergoing chemotherapy and plans to have more treatments 4/11 and 10/14/2020. She says today is not a good day. On eval, she demonstrated difficutly with sit to supine and return, had limited ability to perform sit to stands, decreased gait velocity  and decreased balance in standing. She also needed prolonged time to do the Timed Get Up and Go Test reflecting increased risk for falls and several gait abnomalities.  Even though she has lower extremity lymphedema, she does not want to address that at this time. Her huband says that she sits with her legs up at home. She agrees to participating in PT as able understanding that she will need to do home exercises and increase her activity at home    Personal Factors and Comorbidities Comorbidity 3+    Comorbidities ovarian cancer, malignant ascites, past hip and shoulder replacement, falls    Examination-Activity Limitations Bed Mobility;Bathing;Dressing;Transfers;Locomotion Level;Reach Overhead    Examination-Participation Restrictions Cleaning;Laundry;Community Activity;Driving    Stability/Clinical Decision Making Evolving/Moderate complexity    Clinical Decision Making Moderate    Rehab Potential Good    Clinical Impairments Affecting Rehab Potential ongoing chemo    PT Frequency 2x / week    PT Duration 8 weeks    PT Treatment/Interventions ADLs/Self Care Home Management;Therapeutic exercise;Therapeutic activities;Functional mobility training;Stair training;Gait training;Patient/family education;Manual techniques;Energy conservation;Splinting;Passive range of motion;Neuromuscular re-education;Balance training;Manual lymph drainage;Compression bandaging    PT Next Visit Plan Begin with wall taps and mini squats in standing, side stepping, and shoulder flexion at counter for HEP, work on bed mobility, talk about energy conservation, time mangement in her day to include exercise and decrease sedentary behavior at home. progress to gait and balance training and more strengthening.    Consulted and Agree with Plan of Care Patient;Family member/caregiver    Family Member Consulted husband           Patient will benefit from skilled therapeutic intervention in order to improve the following  deficits and impairments:  Decreased strength,Decreased mobility,Postural dysfunction,Improper body mechanics,Impaired flexibility,Decreased activity tolerance,Decreased endurance,Difficulty walking,Decreased range of motion,Abnormal gait,Decreased balance,Impaired sensation,Increased edema,Hypomobility,Decreased knowledge of use of DME,Decreased safety awareness,Impaired tone,Pain  Visit Diagnosis: Muscle weakness (generalized) - Plan:  PT plan of care cert/re-cert  History of falling - Plan: PT plan of care cert/re-cert  Gait disturbance - Plan: PT plan of care cert/re-cert     Problem List Patient Active Problem List   Diagnosis Date Noted  . Physical debility 09/25/2020  . Lower back pain 09/18/2020  . Goals of care, counseling/discussion 09/18/2020  . Abdominal pain 09/09/2020  . Intractable nausea and vomiting 09/09/2020  . Elevated LFTs 09/09/2020  . Nasal congestion 09/02/2020  . Anorexia 09/02/2020  . Generalized weakness 09/02/2020  . Weight loss, abnormal 07/31/2020  . Ovarian ca ([place]) 07/30/2020  . Pelvic mass 07/26/2020  . Malignant neoplasm of other specified female genital organs ([place])  07/26/2020  . Chronic kidney disease, stage 3 unspecified ([place]) 07/24/2020  . Allergic rhinitis 07/24/2020  . Amnesia 07/24/2020  . Malignant ascites 07/24/2020  . Chronic sinusitis 07/24/2020  . Constipation 07/24/2020  . Eczema 07/24/2020  . Hearing loss 07/24/2020  . History of pancreatitis 07/24/2020  . Iron deficiency 07/24/2020  . Knee pain 07/24/2020  . Nocturia 07/24/2020  . Osteopenia 07/24/2020  . Polymyalgia rheumatica ([place]) 07/24/2020  . Pure hypercholesterolemia 07/24/2020  . Rosacea 07/24/2020  . Sacroiliitis, not elsewhere classified ([place]) 07/24/2020  . Sleep disturbance 07/24/2020  . Spontaneous ecchymosis 07/24/2020  . Vitamin D deficiency 07/24/2020  . Prolapsed internal hemorrhoids 11/04/2017  . Primary osteoarthritis of both shoulders 07/09/2016  .  Status post replacement of right shoulder joint 07/09/2016  . Primary osteoarthritis of one hip, right 12/25/2014  . Dermatochalasis 07/22/2012  . Atrial fibrillation ([place]) 09/17/2011  . Hyperlipidemia 09/17/2011   Donato Heinz. Owens Shark PT  Norwood Levo 10/02/2020, 8:45 PM  [place], [place], [address] Phone: [phone]   Fax:  [phone]  Name: VINESSA MACCONNELL MRN: [id number] Date of Birth: 19-Aug-1940

## 2020-10-04 ENCOUNTER — Ambulatory Visit: Payer: Medicare Other | Admitting: Physical Therapy

## 2020-10-04 ENCOUNTER — Telehealth: Payer: Self-pay | Admitting: *Deleted

## 2020-10-04 ENCOUNTER — Encounter: Payer: Self-pay | Admitting: Physical Therapy

## 2020-10-04 ENCOUNTER — Telehealth: Payer: Self-pay

## 2020-10-04 ENCOUNTER — Other Ambulatory Visit: Payer: Self-pay

## 2020-10-04 DIAGNOSIS — M6281 Muscle weakness (generalized): Secondary | ICD-10-CM | POA: Diagnosis not present

## 2020-10-04 DIAGNOSIS — Z9181 History of falling: Secondary | ICD-10-CM

## 2020-10-04 DIAGNOSIS — R269 Unspecified abnormalities of gait and mobility: Secondary | ICD-10-CM | POA: Diagnosis not present

## 2020-10-04 NOTE — Telephone Encounter (Signed)
Called and left a message to call them back.  Called back and spoke with Vassie and husband. They are complaining of recent hospital stay an [facility]. Given the # for the patient experience to call. They have multiple questions and have written them down and will bring to appt Monday. They verbalized understanding.

## 2020-10-04 NOTE — Telephone Encounter (Signed)
Received phone call from this patient & her husband.  They report that she had a recent stay at [facility] & are very unhappy with the care she received.  Consequently they are asking if the patient should need surgery, does Dr. Berline Lopes practice at any other hospital besides [facility].  Per Dr. Berline Lopes, the decision for surgery has not been made at this point but she does practice at [facility] as well.  Patient & her husband informed, patient has an appointment with Dr. Alvy Bimler on 10/07/20, informed patient to address further questions regarding treatment with Dr. Alvy Bimler at that time.  Patient & husband verbalize understanding.

## 2020-10-04 NOTE — Therapy (Signed)
[place], [place], [address] Phone: [phone]   Fax:  [phone]  Physical Therapy Treatment  Patient Details  Name: [name] MRN: [id number] Date of Birth: Feb 20, 1941 Referring Provider (PT): Dr. Alvy Bimler   Encounter Date: 10/04/2020   PT End of Session - 10/04/20 1340    Visit Number 2    Number of Visits 17    Date for PT Re-Evaluation 12/02/20    PT Start Time 0900    PT Stop Time 0945    PT Time Calculation (min) 45 min    Activity Tolerance Patient tolerated treatment well    Behavior During Therapy [facility] for tasks assessed/performed           Past Medical History:  Diagnosis Date  . Arthritis    right shoulder replacement, right replacement   . Atrial fibrillation ([place])   . Cancer ([place])   . Chronic pancreatitis ([place])   . Constipation   . Osteopenia   . Pancreatitis 1990s  . Syncope 03/2011    Past Surgical History:  Procedure Laterality Date  . APPENDECTOMY  1955  . CHOLECYSTECTOMY  1998  . CYSTECTOMY  1981   [place]  . JOINT REPLACEMENT     right shoulder 2008; right hip 2016  . [place]  . TOTAL HIP ARTHROPLASTY Right 11/2014   [facility], [place], [place]    There were no vitals filed for this visit.   Subjective Assessment - 10/04/20 0911    Subjective Pt says today she feels 'tentative"  she is having trouble sleeping. She thinks late to mid morning will be a good time to exercise    Pertinent History Ovarian cancer diagnosed 2021, has had chemotherapy with 2 doses and plans to have more.Pt has recently has malignant ascites with hospitalization for possible bowel obstruction. She has had weight loss. Past history included right hip replacement 2016; right shoulder replacement 2008.    Currently in Pain? No/denies   only has pain when she is doing certain things                            [place] Adult PT  Treatment/Exercise - 10/04/20 0001      Self-Care   Self-Care --   gave pt a picture of a 3 in 1 and suggested that she consider getting one to use over the toilet to push up from     Exercises   Exercises Other Exercises;Shoulder;Knee/Hip    Other Exercises  see instructions section for Medbridge HEP that pt did 5 reps each      Knee/Hip Exercises: Supine   Bridges Strengthening;Both;10 reps    Single Leg Bridge Strengthening;10 reps    Other Supine Knee/Hip Exercises marching each leg    Other Supine Knee/Hip Exercises instructed in how these exercises will help her with rolling by pushing off with one leg and reaching with arm      Shoulder Exercises: Seated   Other Seated Exercises push ups on yoga blocks 3 sets of 5 with cues to hold for a second at the top    Other Seated Exercises sidebending with elbow toward yoga block to train for sidelying to sit                    PT Short Term Goals - 10/02/20 2036      PT SHORT TERM GOAL #1  Title Pt will report she is be able to do an intital HEP 5 times a week    Time 4    Period Weeks    Status New      PT SHORT TERM GOAL #2   Title Pt will be able to do 7 reps of sit to stand in 30 seconds indicting and improvment in functional strength    Baseline 5 reps  on 10/02/2020    Time 4    Period Weeks    Status New      PT SHORT TERM GOAL #3   Title Pt will be able to do a normal TUG in less than 14 seconds    Baseline 15.51 on 10/02/2020    Time 4    Period Weeks    Status New             PT Long Term Goals - 10/02/20 2038      PT LONG TERM GOAL #1   Title Pt will report she is able to get in and out of bed with minimal assit from her husband .    Baseline pt needs mod assit to get in and out of bed on 10/02/2020    Time 8    Period Weeks    Status New      PT LONG TERM GOAL #2   Title Pt will be able to do 10 repetitions of sit to stand in 30 seconds    Baseline 5 reps on 10/02/2020    Time 8    Period  Weeks    Status New      PT LONG TERM GOAL #3   Title Pt will increase gait speed to .8 meters per second demonstrating a decreased risk for fall compared to baseline    Baseline .65 meters per second on 10/01/2020    Time 8    Period Weeks    Status New      PT LONG TERM GOAL #4   Title Pt will have a home exercise program that she can follow through with on her own at home.    Time 8    Period Weeks    Status New                 Plan - 10/04/20 1340    Clinical Impression Statement Pt and husband attend PT today. They were receptive to [place] format of home exercise program and pt receoved a paper and email copy of instructions as well as verbal instruction with demonstration. She was able to perform 5 reptitions of exercises in standing and feels she would also follow throught with them at home.  She aslo worked on bed mobility and exercises in supine, sidelying and sitting.  We discussed the use of a 3 in 1 over the toilet to have somehting to push up with her arms to  help her with transfers there. Pt and husband happy with treatment today    Personal Factors and Comorbidities Comorbidity 3+    Comorbidities ovarian cancer, malignant ascites, past hip and shoulder replacement, falls    Examination-Activity Limitations Bed Mobility;Bathing;Dressing;Transfers;Locomotion Level;Reach Overhead    Examination-Participation Restrictions Cleaning;Laundry;Community Activity;Driving    Stability/Clinical Decision Making Evolving/Moderate complexity    Rehab Potential Good    Clinical Impairments Affecting Rehab Potential ongoing chemo    PT Frequency 2x / week    PT Treatment/Interventions ADLs/Self Care Home Management;Therapeutic exercise;Therapeutic activities;Functional mobility training;Stair training;Gait training;Patient/family education;Manual techniques;Energy conservation;Splinting;Passive range of  motion;Neuromuscular re-education;Balance training;Manual lymph  drainage;Compression bandaging    PT Next Visit Plan continue with  HEP progression, work on bed mobility, talk about energy conservation, time mangement in her day to include exercise and decrease sedentary behavior at home. progress to gait and balance training and more strengthening.    PT Home Exercise Plan 4/8/202 Medbridge Access Code: [email or web address]           Patient will benefit from skilled therapeutic intervention in order to improve the following deficits and impairments:     Visit Diagnosis: Muscle weakness (generalized)  History of falling  Gait disturbance     Problem List Patient Active Problem List   Diagnosis Date Noted  . Physical debility 09/25/2020  . Lower back pain 09/18/2020  . Goals of care, counseling/discussion 09/18/2020  . Abdominal pain 09/09/2020  . Intractable nausea and vomiting 09/09/2020  . Elevated LFTs 09/09/2020  . Nasal congestion 09/02/2020  . Anorexia 09/02/2020  . Generalized weakness 09/02/2020  . Weight loss, abnormal 07/31/2020  . Ovarian ca ([place]) 07/30/2020  . Pelvic mass 07/26/2020  . Malignant neoplasm of other specified female genital organs ([place])  07/26/2020  . Chronic kidney disease, stage 3 unspecified ([place]) 07/24/2020  . Allergic rhinitis 07/24/2020  . Amnesia 07/24/2020  . Malignant ascites 07/24/2020  . Chronic sinusitis 07/24/2020  . Constipation 07/24/2020  . Eczema 07/24/2020  . Hearing loss 07/24/2020  . History of pancreatitis 07/24/2020  . Iron deficiency 07/24/2020  . Knee pain 07/24/2020  . Nocturia 07/24/2020  . Osteopenia 07/24/2020  . Polymyalgia rheumatica ([place]) 07/24/2020  . Pure hypercholesterolemia 07/24/2020  . Rosacea 07/24/2020  . Sacroiliitis, not elsewhere classified ([place]) 07/24/2020  . Sleep disturbance 07/24/2020  . Spontaneous ecchymosis 07/24/2020  . Vitamin D deficiency 07/24/2020  . Prolapsed internal hemorrhoids 11/04/2017  . Primary osteoarthritis of both shoulders 07/09/2016  .  Status post replacement of right shoulder joint 07/09/2016  . Primary osteoarthritis of one hip, right 12/25/2014  . Dermatochalasis 07/22/2012  . Atrial fibrillation ([place]) 09/17/2011  . Hyperlipidemia 09/17/2011     Donato Heinz. Owens Shark PT  Norwood Levo 10/04/2020, 1:47 PM  [place], [place], [address] Phone: [phone]   Fax:  [phone]  Name: [name] MRN: [id number] Date of Birth: 02/01/1941

## 2020-10-04 NOTE — Patient Instructions (Signed)
Access Code: [email or web address] URL: [email or web address] Date: 10/04/2020 Prepared by: Maudry Diego  Program Notes start with 5 repetitions of each exercise 1 to 2 times a day then progress to 10 reps  in 3 sets with a rest in between    Exercises Shoulder Flexion and Extension with Coordinated Breathing - 1 x daily - 7 x weekly - 3 sets - 10 reps Side Stepping with Counter Support - 1 x daily - 7 x weekly - 3 sets - 10 reps Heel Toe Raises with Counter Support - 1 x daily - 7 x weekly - 3 sets - 10 reps Mini Squat with Counter Support - 1 x daily - 7 x weekly - 3 sets - 10 reps Standing Gluteal Set with Overhead Arm Raise and Wall Slide - 1 x daily - 7 x weekly - 3 sets - 10 reps

## 2020-10-07 ENCOUNTER — Inpatient Hospital Stay: Payer: Medicare Other | Attending: Hematology and Oncology

## 2020-10-07 ENCOUNTER — Ambulatory Visit: Payer: Medicare Other | Admitting: Hematology and Oncology

## 2020-10-07 ENCOUNTER — Inpatient Hospital Stay: Payer: Medicare Other | Admitting: Nutrition

## 2020-10-07 ENCOUNTER — Inpatient Hospital Stay (HOSPITAL_BASED_OUTPATIENT_CLINIC_OR_DEPARTMENT_OTHER): Payer: Medicare Other | Admitting: Hematology and Oncology

## 2020-10-07 ENCOUNTER — Other Ambulatory Visit: Payer: Self-pay

## 2020-10-07 ENCOUNTER — Other Ambulatory Visit: Payer: Medicare Other

## 2020-10-07 ENCOUNTER — Inpatient Hospital Stay: Payer: Medicare Other

## 2020-10-07 ENCOUNTER — Ambulatory Visit: Payer: Medicare Other

## 2020-10-07 ENCOUNTER — Encounter: Payer: Self-pay | Admitting: Hematology and Oncology

## 2020-10-07 DIAGNOSIS — Z7189 Other specified counseling: Secondary | ICD-10-CM

## 2020-10-07 DIAGNOSIS — C569 Malignant neoplasm of unspecified ovary: Secondary | ICD-10-CM

## 2020-10-07 DIAGNOSIS — Z5111 Encounter for antineoplastic chemotherapy: Secondary | ICD-10-CM | POA: Insufficient documentation

## 2020-10-07 DIAGNOSIS — E441 Mild protein-calorie malnutrition: Secondary | ICD-10-CM

## 2020-10-07 DIAGNOSIS — R180 Malignant ascites: Secondary | ICD-10-CM | POA: Diagnosis not present

## 2020-10-07 DIAGNOSIS — D638 Anemia in other chronic diseases classified elsewhere: Secondary | ICD-10-CM | POA: Diagnosis not present

## 2020-10-07 DIAGNOSIS — E43 Unspecified severe protein-calorie malnutrition: Secondary | ICD-10-CM | POA: Insufficient documentation

## 2020-10-07 LAB — CMP (CANCER CENTER ONLY)
ALT: 35 U/L (ref 0–44)
AST: 16 U/L (ref 15–41)
Albumin: 3 g/dL — ABNORMAL LOW (ref 3.5–5.0)
Alkaline Phosphatase: 283 U/L — ABNORMAL HIGH (ref 38–126)
Anion gap: 13 (ref 5–15)
BUN: 22 mg/dL (ref 8–23)
CO2: 23 mmol/L (ref 22–32)
Calcium: 8.9 mg/dL (ref 8.9–10.3)
Chloride: 102 mmol/L (ref 98–111)
Creatinine: 0.71 mg/dL (ref 0.44–1.00)
GFR, Estimated: >60 mL/min (ref >60)
Glucose, Bld: 117 mg/dL — ABNORMAL HIGH (ref 70–99)
Potassium: 3.9 mmol/L (ref 3.5–5.1)
Sodium: 138 mmol/L (ref 135–145)
Total Bilirubin: 0.3 mg/dL (ref 0.3–1.2)
Total Protein: 6.1 g/dL — ABNORMAL LOW (ref 6.5–8.1)

## 2020-10-07 LAB — CBC WITH DIFFERENTIAL (CANCER CENTER ONLY)
Abs Immature Granulocytes: 0.02 10*3/uL (ref 0.00–0.07)
Basophils Absolute: 0 10*3/uL (ref 0.0–0.1)
Basophils Relative: 1 %
Eosinophils Absolute: 0 10*3/uL (ref 0.0–0.5)
Eosinophils Relative: 1 %
HCT: 33.5 % — ABNORMAL LOW (ref 36.0–46.0)
Hemoglobin: 11.2 g/dL — ABNORMAL LOW (ref 12.0–15.0)
Immature Granulocytes: 0 %
Lymphocytes Relative: 15 %
Lymphs Abs: 0.8 10*3/uL (ref 0.7–4.0)
MCH: 28.8 pg (ref 26.0–34.0)
MCHC: 33.4 g/dL (ref 30.0–36.0)
MCV: 86.1 fL (ref 80.0–100.0)
Monocytes Absolute: 0.5 10*3/uL (ref 0.1–1.0)
Monocytes Relative: 9 %
Neutro Abs: 3.7 10*3/uL (ref 1.7–7.7)
Neutrophils Relative %: 74 %
Platelet Count: 305 10*3/uL (ref 150–400)
RBC: 3.89 MIL/uL (ref 3.87–5.11)
RDW: 16.6 % — ABNORMAL HIGH (ref 11.5–15.5)
WBC Count: 5 10*3/uL (ref 4.0–10.5)
nRBC: 0 % (ref 0.0–0.2)

## 2020-10-07 MED ORDER — SODIUM CHLORIDE 0.9 % IV SOLN
Freq: Once | INTRAVENOUS | Status: AC
  Filled 2020-10-07: qty 250

## 2020-10-07 MED ORDER — SODIUM CHLORIDE 0.9 % IV SOLN
Freq: Once | INTRAVENOUS | Status: DC
  Filled 2020-10-07: qty 250

## 2020-10-07 MED ORDER — PROCHLORPERAZINE MALEATE 10 MG PO TABS
ORAL_TABLET | ORAL | Status: AC
  Filled 2020-10-07: qty 1

## 2020-10-07 MED ORDER — PROCHLORPERAZINE MALEATE 10 MG PO TABS
10.0000 mg | ORAL_TABLET | Freq: Once | ORAL | Status: AC
  Administered 2020-10-07: 10 mg via ORAL

## 2020-10-07 MED ORDER — SODIUM CHLORIDE 0.9 % IV SOLN
800.0000 mg/m2 | Freq: Once | INTRAVENOUS | Status: AC
  Administered 2020-10-07: 1254 mg via INTRAVENOUS
  Filled 2020-10-07: qty 32.98

## 2020-10-07 NOTE — Assessment & Plan Note (Signed)
She has probable malignant ascites on exam but she is not symptomatic She does not need paracentesis for now

## 2020-10-07 NOTE — Assessment & Plan Note (Signed)
Her performance status has improved We discussed the risk and benefits of pursuing further treatment The patient and her husband have difficulties understanding the role of treatment and expected side effects despite many hours of discussion I reiterated today that the expected benefit of getting her disease under control with single agent gemcitabine is in the range of 20% There is no guarantee that it will help her I will not be able to switch treatment until we try at least 2-1/2 months of treatment, roughly 3 cycles of therapy before repeating CT imaging  We discussed the risk and benefits of second opinion elsewhere The patient's husband has left multiple messages last week requesting discussion about second opinion and as I reviewed with him multiple different options, including referral to [place], [facility] or [facility] or another Psychologist, sport and exercise from [place], the patient and her husband is undecided I plan to see her again next week for further follow-up

## 2020-10-07 NOTE — Assessment & Plan Note (Signed)
Her oral intake has improved She has gained some weight She will be seen by dietitian today

## 2020-10-07 NOTE — Progress Notes (Signed)
[facility] OFFICE PROGRESS NOTE  Patient Care Team: [name], MD as PCP - General (Family Medicine) [name], MD as Consulting Physician (Cardiology)  ASSESSMENT & PLAN:  Ovarian ca [facility]) Her performance status has improved We discussed the risk and benefits of pursuing further treatment The patient and her husband have difficulties understanding the role of treatment and expected side effects despite many hours of discussion I reiterated today that the expected benefit of getting her disease under control with single agent gemcitabine is in the range of 20% There is no guarantee that it will help her I will not be able to switch treatment until we try at least 2-1/2 months of treatment, roughly 3 cycles of therapy before repeating CT imaging  We discussed the risk and benefits of second opinion elsewhere The patient's husband has left multiple messages last week requesting discussion about second opinion and as I reviewed with him multiple different options, including referral to [place], [facility] or [facility] or another Psychologist, sport and exercise from [place], the patient and her husband is undecided I plan to see her again next week for further follow-up  Anemia due to chronic illness She is not symptomatic Observe for now  Malignant ascites She has probable malignant ascites on exam but she is not symptomatic She does not need paracentesis for now  Mild protein malnutrition ([place]) Her oral intake has improved She has gained some weight She will be seen by dietitian today  Goals of care, counseling/discussion I have numerous goals of care discussions with the patient and her husband many times The patient spent more than 50% of today's time ruminating about her negative past experience while being hospitalized The patient and her husband did not recall half the conversation that has happened over the past few weeks She could not accept the fact that she had partial small bowel  obstruction that led to hospitalization The patient has difficulties understanding about the expected benefit to be gained by chemotherapy I told the patient she might develop complications from chemotherapy and there is no guarantee whether these complications can be managed successfully as an outpatient or not I stop her several times today because we ran out of time during the appointment; she still ruminates over her unpleasant experience in the hospital and I offered her another visit if needed in the future to address all her concerns   No orders of the defined types were placed in this encounter.   All questions were answered. The patient knows to call the clinic with any problems, questions or concerns. The total time spent in the appointment was 30 minutes encounter with patients including review of chart and various tests results, discussions about plan of care and coordination of care plan   [name], MD 10/07/2020 2:07 PM  INTERVAL HISTORY: Please see below for problem oriented charting. She returns with her husband today Since last time I saw her, she is eating better and is gaining weight She denies nausea She stopped using MiraLAX up until a few days ago She developed some mild constipation when she stopped taking MiraLAX She denies abdominal pain The patient and her husband asked several questions repeatedly today, examples of their questions are as follows: 1) how long can I expect to be on this treatment? 2) when can I have surgery and be cured? 3) if I do not like the chemotherapy, are there other options available? 4) when can we set up an appointment to meet with other doctors  for second opinion? The timing of the appointment has to match her needs because she typically do not feel well after chemo   SUMMARY OF ONCOLOGIC HISTORY: Oncology History  Ovarian ca ([place])  07/22/2020 Imaging   Ct abdomen and pelvis 1. Large calcified mass in the pelvis that is new from  2010. There is associated large volume ascites with mild peritoneal nodularity/calcification, primarily worrisome for a calcifying ovarian malignancy. 2. Small right pleural effusion with few calcifications in the anterior juxta diaphragmatic space, possibly related to #1. 3. Nonenlarged but calcified periaortic lymph nodes which are also worrisome for spread of disease.   07/29/2020 Procedure   Successful ultrasound-guided diagnostic and therapeutic paracentesis yielding 5 liters of peritoneal fluid.   07/29/2020 Tumor Marker   Patient's tumor was tested for the following markers: CA-125 Results of the tumor marker test revealed 771   07/30/2020 Initial Diagnosis   Ovarian ca ([place])   07/31/2020 Imaging   1. Calcified periesophageal and juxtadiaphragmatic nodules are indicative of metastatic disease. 2. Small right and trace left pleural effusions. 3. Ascites and calcified upper abdominal adenopathy/peritoneal nodules, better assessed on 07/22/2020. 4. Aortic atherosclerosis (ICD10-I70.0). Coronary artery calcification.   07/31/2020 Cancer Staging   Staging form: Ovary, Fallopian Tube, and Primary Peritoneal Carcinoma, AJCC 8th Edition - Clinical stage from 07/31/2020: Stage IV (cT3, cN1, cM1) - Signed by [name], MD on 07/31/2020 Stage prefix: Initial diagnosis   08/01/2020 Tumor Marker   Patient's tumor was tested for the following markers: CA-125 Results of the tumor marker test revealed 641   08/05/2020 - 08/26/2020 Chemotherapy         08/26/2020 Tumor Marker   Patient's tumor was tested for the following markers: CA-125 Results of the tumor marker test revealed 663.   09/03/2020 Procedure   Successful ultrasound-guided therapeutic paracentesis yielding 5 liters of peritoneal fluid.   09/09/2020 - 09/14/2020 Hospital Admission   She was admitted to the hospital for management of subacute bowel obstruction   09/10/2020 Imaging   A total of approximately 2.9 L of clear yellow fluid was  removed.   09/11/2020 Imaging   1. Small bowel obstruction with a transition zone seen within the posterolateral aspect of the left lower quadrant. 2. Large, stable calcified pelvic mass consistent with the patient's known history of ovarian cancer. 3. Mild-to-moderate amount of free fluid within the abdomen and pelvis. 4. Stable hepatic and left renal cysts. 5. Total right hip replacement. 6. Approximately 5 mm anterolisthesis of the L5 vertebral body on S1.   09/13/2020 Imaging   B/L venous Doppler US Right:  No evidence of deep vein thrombosis in the upper extremity. Findings consistent with acute superficial vein thrombosis involving the right forearm portion of the basilic vein.     Left:  No evidence of thrombosis in the subclavian.   10/07/2020 -  Chemotherapy    Patient is on Treatment Plan: OVARIAN GEMCITABINE D1,8 Q21D        REVIEW OF SYSTEMS:   Constitutional: Denies fevers, chills or abnormal weight loss Eyes: Denies blurriness of vision Ears, nose, mouth, throat, and face: Denies mucositis or sore throat Respiratory: Denies cough, dyspnea or wheezes Cardiovascular: Denies palpitation, chest discomfort or lower extremity swelling Gastrointestinal:  Denies nausea, heartburn or change in bowel habits Skin: Denies abnormal skin rashes Lymphatics: Denies new lymphadenopathy or easy bruising Neurological:Denies numbness, tingling or new weaknesses Behavioral/Psych: Mood is stable, no new changes  All other systems were reviewed with the patient and are negative.  I have reviewed the past medical history, past surgical history, social history and family history with the patient and they are unchanged from previous note.  ALLERGIES:  is allergic to cephalexin, hydrocodone-guaifenesin, tape, and tramadol hcl.  MEDICATIONS:  Current Outpatient Medications  Medication Sig Dispense Refill  . magnesium oxide (MAG-OX) 400 MG tablet Take 400 mg by mouth daily.    . Multiple  Vitamin (MULTI-VITAMINS) TABS Take 1 tablet by mouth daily.    . ondansetron (ZOFRAN) 8 MG tablet TAKE 1 TABLET BY MOUTH EVERY 8 HOURS AS NEEDED FOR REFRACTORY NAUSEA AND/OR VOMITING. START ON DAY 3 AFTER CARBOPLATIN CHEMO (Patient not taking: No sig reported) 30 tablet 1  . oxyCODONE (ROXICODONE) 5 MG immediate release tablet Take 1 tablet (5 mg total) by mouth every 4 (four) hours as needed for severe pain. 30 tablet 0  . polyethylene glycol (MIRALAX / GLYCOLAX) 17 g packet Take 17 g by mouth daily as needed for severe constipation. 14 each 0  . pravastatin (PRAVACHOL) 20 MG tablet Take 1 tablet (20 mg total) by mouth daily. (Patient not taking: No sig reported) 90 tablet 3  . prochlorperazine (COMPAZINE) 10 MG tablet Take 1 tablet (10 mg total) by mouth every 6 (six) hours as needed (Nausea or vomiting). 5 tablet 0   No current facility-administered medications for this visit.   Facility-Administered Medications Ordered in Other Visits  Medication Dose Route Frequency Provider Last Rate Last Admin  . 0.9 %  sodium chloride infusion   Intravenous Once Alvy Bimler, Harmoney Sienkiewicz, MD        PHYSICAL EXAMINATION: ECOG PERFORMANCE STATUS: 2 - Symptomatic, <50% confined to bed  Vitals:   10/07/20 1028  BP: 136/65  Pulse: 71  Resp: 18  Temp: (!) 97.4 F (36.3 C)  SpO2: 100%   Filed Weights   10/07/20 1028  Weight: 133 lb 12.8 oz (60.7 kg)    GENERAL:alert, no distress and comfortable SKIN: skin color, texture, turgor are normal, no rashes or significant lesions EYES: normal, Conjunctiva are pink and non-injected, sclera clear OROPHARYNX:no exudate, no erythema and lips, buccal mucosa, and tongue normal  NECK: supple, thyroid normal size, non-tender, without nodularity LYMPH:  no palpable lymphadenopathy in the cervical, axillary or inguinal LUNGS: clear to auscultation and percussion with normal breathing effort HEART: regular rate & rhythm and no murmurs and no lower extremity  edema ABDOMEN:abdomen soft, distended with ascites  Musculoskeletal:no cyanosis of digits and no clubbing  NEURO: alert & oriented x 3 with fluent speech, no focal motor/sensory deficits  LABORATORY DATA:  I have reviewed the data as listed    Component Value Date/Time   NA 138 10/07/2020 1003   NA 141 05/29/2019 1143   K 3.9 10/07/2020 1003   CL 102 10/07/2020 1003   CO2 23 10/07/2020 1003   GLUCOSE 117 (H) 10/07/2020 1003   BUN 22 10/07/2020 1003   BUN 29 (H) 05/29/2019 1143   CREATININE 0.71 10/07/2020 1003   CALCIUM 8.9 10/07/2020 1003   PROT 6.1 (L) 10/07/2020 1003   ALBUMIN 3.0 (L) 10/07/2020 1003   AST 16 10/07/2020 1003   ALT 35 10/07/2020 1003   ALKPHOS 283 (H) 10/07/2020 1003   BILITOT 0.3 10/07/2020 1003   GFRNONAA >60 10/07/2020 1003   GFRAA 74 05/29/2019 1143    No results found for: SPEP, UPEP  Lab Results  Component Value Date   WBC 5.0 10/07/2020   NEUTROABS 3.7 10/07/2020   HGB 11.2 (L) 10/07/2020   HCT  33.5 (L) 10/07/2020   MCV 86.1 10/07/2020   PLT 305 10/07/2020      Chemistry      Component Value Date/Time   NA 138 10/07/2020 1003   NA 141 05/29/2019 1143   K 3.9 10/07/2020 1003   CL 102 10/07/2020 1003   CO2 23 10/07/2020 1003   BUN 22 10/07/2020 1003   BUN 29 (H) 05/29/2019 1143   CREATININE 0.71 10/07/2020 1003      Component Value Date/Time   CALCIUM 8.9 10/07/2020 1003   ALKPHOS 283 (H) 10/07/2020 1003   AST 16 10/07/2020 1003   ALT 35 10/07/2020 1003   BILITOT 0.3 10/07/2020 1003

## 2020-10-07 NOTE — Assessment & Plan Note (Signed)
I have numerous goals of care discussions with the patient and her husband many times The patient spent more than 50% of today's time ruminating about her negative past experience while being hospitalized The patient and her husband did not recall half the conversation that has happened over the past few weeks She could not accept the fact that she had partial small bowel obstruction that led to hospitalization The patient has difficulties understanding about the expected benefit to be gained by chemotherapy I told the patient she might develop complications from chemotherapy and there is no guarantee whether these complications can be managed successfully as an outpatient or not I stop her several times today because we ran out of time during the appointment; she still ruminates over her unpleasant experience in the hospital and I offered her another visit if needed in the future to address all her concerns

## 2020-10-07 NOTE — Patient Instructions (Signed)
[facility] Discharge Instructions for Patients Receiving Chemotherapy  Today you received the following chemotherapy agents: gemcitabine.  To help prevent nausea and vomiting after your treatment, we encourage you to take your nausea medication as directed.   If you develop nausea and vomiting that is not controlled by your nausea medication, call the clinic.   BELOW ARE SYMPTOMS THAT SHOULD BE REPORTED IMMEDIATELY:  *FEVER GREATER THAN 100.5 F  *CHILLS WITH OR WITHOUT FEVER  NAUSEA AND VOMITING THAT IS NOT CONTROLLED WITH YOUR NAUSEA MEDICATION  *UNUSUAL SHORTNESS OF BREATH  *UNUSUAL BRUISING OR BLEEDING  TENDERNESS IN MOUTH AND THROAT WITH OR WITHOUT PRESENCE OF ULCERS  *URINARY PROBLEMS  *BOWEL PROBLEMS  UNUSUAL RASH Items with * indicate a potential emergency and should be followed up as soon as possible.  Feel free to call the clinic should you have any questions or concerns. The clinic phone number is [phone].  Please show the [facility] at check-in to the Emergency Department and triage nurse.  Gemcitabine injection What is this medicine? GEMCITABINE (jem SYE ta been) is a chemotherapy drug. This medicine is used to treat many types of cancer like breast cancer, lung cancer, pancreatic cancer, and ovarian cancer. This medicine may be used for other purposes; ask your health care provider or pharmacist if you have questions. COMMON BRAND NAME(S): Gemzar, Infugem What should I tell my health care provider before I take this medicine? They need to know if you have any of these conditions:  blood disorders  infection  kidney disease  liver disease  lung or breathing disease, like asthma  recent or ongoing radiation therapy  an unusual or allergic reaction to gemcitabine, other chemotherapy, other medicines, foods, dyes, or preservatives  pregnant or trying to get pregnant  breast-feeding How should I use this medicine? This  drug is given as an infusion into a vein. It is administered in a hospital or clinic by a specially trained health care professional. Talk to your pediatrician regarding the use of this medicine in children. Special care may be needed. Overdosage: If you think you have taken too much of this medicine contact a poison control center or emergency room at once. NOTE: This medicine is only for you. Do not share this medicine with others. What if I miss a dose? It is important not to miss your dose. Call your doctor or health care professional if you are unable to keep an appointment. What may interact with this medicine?  medicines to increase blood counts like filgrastim, pegfilgrastim, sargramostim  some other chemotherapy drugs like cisplatin  vaccines Talk to your doctor or health care professional before taking any of these medicines:  acetaminophen  aspirin  ibuprofen  ketoprofen  naproxen This list may not describe all possible interactions. Give your health care provider a list of all the medicines, herbs, non-prescription drugs, or dietary supplements you use. Also tell them if you smoke, drink alcohol, or use illegal drugs. Some items may interact with your medicine. What should I watch for while using this medicine? Visit your doctor for checks on your progress. This drug may make you feel generally unwell. This is not uncommon, as chemotherapy can affect healthy cells as well as cancer cells. Report any side effects. Continue your course of treatment even though you feel ill unless your doctor tells you to stop. In some cases, you may be given additional medicines to help with side effects. Follow all directions for their use. Call  your doctor or health care professional for advice if you get a fever, chills or sore throat, or other symptoms of a cold or flu. Do not treat yourself. This drug decreases your body's ability to fight infections. Try to avoid being around people who  are sick. This medicine may increase your risk to bruise or bleed. Call your doctor or health care professional if you notice any unusual bleeding. Be careful brushing and flossing your teeth or using a toothpick because you may get an infection or bleed more easily. If you have any dental work done, tell your dentist you are receiving this medicine. Avoid taking products that contain aspirin, acetaminophen, ibuprofen, naproxen, or ketoprofen unless instructed by your doctor. These medicines may hide a fever. Do not become pregnant while taking this medicine or for 6 months after stopping it. Women should inform their doctor if they wish to become pregnant or think they might be pregnant. Men should not father a child while taking this medicine and for 3 months after stopping it. There is a potential for serious side effects to an unborn child. Talk to your health care professional or pharmacist for more information. Do not breast-feed an infant while taking this medicine or for at least 1 week after stopping it. Men should inform their doctors if they wish to father a child. This medicine may lower sperm counts. Talk with your doctor or health care professional if you are concerned about your fertility. What side effects may I notice from receiving this medicine? Side effects that you should report to your doctor or health care professional as soon as possible:  allergic reactions like skin rash, itching or hives, swelling of the face, lips, or tongue  breathing problems  pain, redness, or irritation at site where injected  signs and symptoms of a dangerous change in heartbeat or heart rhythm like chest pain; dizziness; fast or irregular heartbeat; palpitations; feeling faint or lightheaded, falls; breathing problems  signs of decreased platelets or bleeding - bruising, pinpoint red spots on the skin, black, tarry stools, blood in the urine  signs of decreased red blood cells - unusually weak or  tired, feeling faint or lightheaded, falls  signs of infection - fever or chills, cough, sore throat, pain or difficulty passing urine  signs and symptoms of kidney injury like trouble passing urine or change in the amount of urine  signs and symptoms of liver injury like dark yellow or brown urine; general ill feeling or flu-like symptoms; light-colored stools; loss of appetite; nausea; right upper belly pain; unusually weak or tired; yellowing of the eyes or skin  swelling of ankles, feet, hands Side effects that usually do not require medical attention (report to your doctor or health care professional if they continue or are bothersome):  constipation  diarrhea  hair loss  loss of appetite  nausea  rash  vomiting This list may not describe all possible side effects. Call your doctor for medical advice about side effects. You may report side effects to FDA at 1-800-FDA-1088. Where should I keep my medicine? This drug is given in a hospital or clinic and will not be stored at home. NOTE: This sheet is a summary. It may not cover all possible information. If you have questions about this medicine, talk to your doctor, pharmacist, or health care provider.  2021 Elsevier/Gold Standard (2017-09-08 18:06:11)

## 2020-10-07 NOTE — Progress Notes (Signed)
Nutrition follow-up was completed with patient during infusion for ovarian cancer. Patient's weight has improved and was documented as 133.8 pounds April 11.  This is increased from 125.6 pounds March 23 and 129.4 pounds March 30. Labs were reviewed. Patient reports she is trying to eat more often and include higher calorie higher protein foods.  She cannot tell me if she is doing this or not however her weight does reflect significant increase.  Reports bowel movements are within normal limits by taking MiraLAX.  She does not remember if she received mailed nutrition fact sheets.  Estimated nutrition needs: 1800-2000 cal, 90-100 g protein, 2.0 L fluid.  Nutrition diagnosis: Unintended weight loss improved.  Intervention: Patient educated to continue strategies for increased calorie and protein intake and small frequent meals and snacks.  Provided additional copies of nutrition facts sheets today. Encouraged increased fluid intake to help with bowel regimen. Questions were answered.  Teach back method used.  Contact information provided.  Monitoring, evaluation, goals: Patient will tolerate sufficient calories and protein to minimize weight loss and promote weight stabilization/weight gain.  Next visit: To be scheduled with upcoming treatment as needed.  **Disclaimer: This note was dictated with voice recognition software. Similar sounding words can inadvertently be transcribed and this note may contain transcription errors which may not have been corrected upon publication of note.**

## 2020-10-07 NOTE — Assessment & Plan Note (Signed)
She is not symptomatic. Observe for now 

## 2020-10-08 LAB — CA 125: Cancer Antigen (CA) 125: 544 U/mL — ABNORMAL HIGH (ref 0.0–38.1)

## 2020-10-09 ENCOUNTER — Telehealth: Payer: Self-pay

## 2020-10-09 NOTE — Telephone Encounter (Signed)
Husband called and left a message to call them.  Called back and spoke with husband and [name] over speaker phone. [name] has not had a bm since Monday and it was a small constipated stool. She is taking Miralax daily and x1 Senokot daily. She is able to eat & drink. Instructed to take Miralax BID and Senokot 2 tabs TID. Ask them to call back in the am with a update. They verbalized understanding.

## 2020-10-10 ENCOUNTER — Telehealth: Payer: Self-pay

## 2020-10-10 NOTE — Telephone Encounter (Signed)
She can reduce back to just miralax once a day

## 2020-10-10 NOTE — Telephone Encounter (Signed)
Patient notified, no further needs at this time.

## 2020-10-10 NOTE — Telephone Encounter (Signed)
Patient's husband called to report that patient had 1 liquid bowel movement last night, 4/13, and 1 formed bowel movement this morning.   Per husband they are using Miralax BID and Senokot 2 tablets TID as recommended.   Husband inquiring if this recommendation should continue since she is having normal BM's or resume previous regiment of Miralax daily/Senokot daily.

## 2020-10-11 ENCOUNTER — Telehealth: Payer: Self-pay

## 2020-10-11 NOTE — Telephone Encounter (Signed)
Returned call from husband and spoke with [name]. She is having edema to lower legs from the knees down. More edema in the left than the right. She is unable to wear normal shoes. It started a few days ago. Reported above to Dr. Alvy Bimler. Instructed to walk around more and elevated lower legs when she sits down. Reminded of appts Monday. She verbalized understanding.

## 2020-10-14 ENCOUNTER — Encounter: Payer: Self-pay | Admitting: Hematology and Oncology

## 2020-10-14 ENCOUNTER — Other Ambulatory Visit: Payer: Self-pay

## 2020-10-14 ENCOUNTER — Encounter: Payer: Self-pay | Admitting: Licensed Clinical Social Worker

## 2020-10-14 ENCOUNTER — Inpatient Hospital Stay: Payer: Medicare Other

## 2020-10-14 ENCOUNTER — Telehealth: Payer: Self-pay | Admitting: Hematology and Oncology

## 2020-10-14 ENCOUNTER — Inpatient Hospital Stay (HOSPITAL_BASED_OUTPATIENT_CLINIC_OR_DEPARTMENT_OTHER): Payer: Medicare Other | Admitting: Hematology and Oncology

## 2020-10-14 DIAGNOSIS — R180 Malignant ascites: Secondary | ICD-10-CM

## 2020-10-14 DIAGNOSIS — C569 Malignant neoplasm of unspecified ovary: Secondary | ICD-10-CM

## 2020-10-14 DIAGNOSIS — R64 Cachexia: Secondary | ICD-10-CM

## 2020-10-14 DIAGNOSIS — E441 Mild protein-calorie malnutrition: Secondary | ICD-10-CM

## 2020-10-14 DIAGNOSIS — M7989 Other specified soft tissue disorders: Secondary | ICD-10-CM | POA: Diagnosis not present

## 2020-10-14 DIAGNOSIS — F32A Depression, unspecified: Secondary | ICD-10-CM

## 2020-10-14 DIAGNOSIS — D638 Anemia in other chronic diseases classified elsewhere: Secondary | ICD-10-CM

## 2020-10-14 DIAGNOSIS — Z5111 Encounter for antineoplastic chemotherapy: Secondary | ICD-10-CM | POA: Diagnosis not present

## 2020-10-14 DIAGNOSIS — F320 Major depressive disorder, single episode, mild: Secondary | ICD-10-CM

## 2020-10-14 LAB — CBC WITH DIFFERENTIAL (CANCER CENTER ONLY)
Abs Immature Granulocytes: 0.02 10*3/uL (ref 0.00–0.07)
Basophils Absolute: 0 10*3/uL (ref 0.0–0.1)
Basophils Relative: 0 %
Eosinophils Absolute: 0 10*3/uL (ref 0.0–0.5)
Eosinophils Relative: 0 %
HCT: 30 % — ABNORMAL LOW (ref 36.0–46.0)
Hemoglobin: 9.9 g/dL — ABNORMAL LOW (ref 12.0–15.0)
Immature Granulocytes: 0 %
Lymphocytes Relative: 15 %
Lymphs Abs: 0.7 10*3/uL (ref 0.7–4.0)
MCH: 28.5 pg (ref 26.0–34.0)
MCHC: 33 g/dL (ref 30.0–36.0)
MCV: 86.5 fL (ref 80.0–100.0)
Monocytes Absolute: 0.4 10*3/uL (ref 0.1–1.0)
Monocytes Relative: 8 %
Neutro Abs: 3.5 10*3/uL (ref 1.7–7.7)
Neutrophils Relative %: 77 %
Platelet Count: 198 10*3/uL (ref 150–400)
RBC: 3.47 MIL/uL — ABNORMAL LOW (ref 3.87–5.11)
RDW: 15.9 % — ABNORMAL HIGH (ref 11.5–15.5)
WBC Count: 4.6 10*3/uL (ref 4.0–10.5)
nRBC: 0 % (ref 0.0–0.2)

## 2020-10-14 LAB — CMP (CANCER CENTER ONLY)
ALT: 16 U/L (ref 0–44)
AST: 14 U/L — ABNORMAL LOW (ref 15–41)
Albumin: 2.8 g/dL — ABNORMAL LOW (ref 3.5–5.0)
Alkaline Phosphatase: 150 U/L — ABNORMAL HIGH (ref 38–126)
Anion gap: 12 (ref 5–15)
BUN: 17 mg/dL (ref 8–23)
CO2: 25 mmol/L (ref 22–32)
Calcium: 8.5 mg/dL — ABNORMAL LOW (ref 8.9–10.3)
Chloride: 103 mmol/L (ref 98–111)
Creatinine: 0.73 mg/dL (ref 0.44–1.00)
GFR, Estimated: >60 mL/min (ref >60)
Glucose, Bld: 131 mg/dL — ABNORMAL HIGH (ref 70–99)
Potassium: 4 mmol/L (ref 3.5–5.1)
Sodium: 140 mmol/L (ref 135–145)
Total Bilirubin: <0.2 mg/dL — ABNORMAL LOW (ref 0.3–1.2)
Total Protein: 5.9 g/dL — ABNORMAL LOW (ref 6.5–8.1)

## 2020-10-14 MED ORDER — SODIUM CHLORIDE 0.9 % IV SOLN
Freq: Once | INTRAVENOUS | Status: AC
  Filled 2020-10-14: qty 250

## 2020-10-14 MED ORDER — MIRTAZAPINE 15 MG PO TABS: 15.0000 mg | ORAL_TABLET | Freq: Every day | ORAL | 2 refills | Status: DC

## 2020-10-14 MED ORDER — PROCHLORPERAZINE MALEATE 10 MG PO TABS
ORAL_TABLET | ORAL | Status: AC
  Filled 2020-10-14: qty 1

## 2020-10-14 MED ORDER — PROCHLORPERAZINE MALEATE 10 MG PO TABS
10.0000 mg | ORAL_TABLET | Freq: Once | ORAL | Status: AC
  Administered 2020-10-14: 10 mg via ORAL

## 2020-10-14 MED ORDER — SODIUM CHLORIDE 0.9 % IV SOLN
800.0000 mg/m2 | Freq: Once | INTRAVENOUS | Status: AC
  Administered 2020-10-14: 1254 mg via INTRAVENOUS
  Filled 2020-10-14: qty 32.98

## 2020-10-14 NOTE — Assessment & Plan Note (Signed)
She is not symptomatic. Observe for now 

## 2020-10-14 NOTE — Telephone Encounter (Signed)
Scheduled appts per 4/18 sch msg. Updated calendar printed for pt in infusion today per RN Lonn [place].

## 2020-10-14 NOTE — Assessment & Plan Note (Signed)
She has feeling of sadness, hopelessness and admits she felt somewhat depressed We discussed the importance of counseling She is willing to try medication She is made aware that typical antidepressant will take several weeks to take effect Given her struggle with malignant cachexia, I will start her on low-dose mirtazapine daily in the evening We discussed side effects of mirtazapine and she is willing to try We also arrange for her to meet with chaplain for spiritual support

## 2020-10-14 NOTE — Assessment & Plan Note (Signed)
The patient asked the same question 4 different ways today and does not seem to remember the answer to her question Her husband is present and wrote down the answer Repeatedly, she inquire when she can be done with chemotherapy, if she needs chemotherapy for the rest of her life, whether she stop coming here for chemotherapy and how many more doses of chemotherapy she needs? I reinforced the answer to the patient and her husband She need at least 3 cycles of gemcitabine, meaning 6 doses in total before repeat CT imaging Clinically, she tolerated gemcitabine better The only side effects are seen is mild anemia I will see her weekly for supportive care and follow-up

## 2020-10-14 NOTE — Assessment & Plan Note (Signed)
She has persistent ascites The patient felt it is causing discomfort when she put on her clothes but not causing shortness of breath, pain or changes in her bowel habits She is made aware that therapeutic paracentesis could cause her to lose some protein After much discussion, the patient and her husband made informed decision not to pursue paracentesis this week

## 2020-10-14 NOTE — Progress Notes (Signed)
[place] OFFICE PROGRESS NOTE  Patient Care Team: [name], MD as PCP - General (Family Medicine) [name], MD as Consulting Physician (Cardiology)  ASSESSMENT & PLAN:  Ovarian ca [name]) The patient asked the same question 4 different ways today and does not seem to remember the answer to her question Her husband is present and wrote down the answer Repeatedly, she inquire when she can be done with chemotherapy, if she needs chemotherapy for the rest of her life, whether she stop coming here for chemotherapy and how many more doses of chemotherapy she needs? I reinforced the answer to the patient and her husband She need at least 3 cycles of gemcitabine, meaning 6 doses in total before repeat CT imaging Clinically, she tolerated gemcitabine better The only side effects are seen is mild anemia I will see her weekly for supportive care and follow-up  Anemia due to chronic illness She is not symptomatic Observe for now  Malignant ascites She has persistent ascites The patient felt it is causing discomfort when she put on her clothes but not causing shortness of breath, pain or changes in her bowel habits She is made aware that therapeutic paracentesis could cause her to lose some protein After much discussion, the patient and her husband made informed decision not to pursue paracentesis this week  Mild depression ([place]) She has feeling of sadness, hopelessness and admits she felt somewhat depressed We discussed the importance of counseling She is willing to try medication She is made aware that typical antidepressant will take several weeks to take effect Given her struggle with malignant cachexia, I will start her on low-dose mirtazapine daily in the evening We discussed side effects of mirtazapine and she is willing to try We also arrange for her to meet with chaplain for spiritual support  Mild protein malnutrition ([place]) Despite some weight gain, her  protein status has declined Reinforced again the importance of adequate oral intake and high-protein diet  Leg swelling She has diffuse bilateral lower extremity edema This is due to low protein status and reduced activity Recommend observation only at this point I do not recommend diuretic therapy She is not likely to tolerate elastic compression hose   No orders of the defined types were placed in this encounter.   All questions were answered. The patient knows to call the clinic with any problems, questions or concerns. The total time spent in the appointment was 30 minutes encounter with patients including review of chart and various tests results, discussions about plan of care and coordination of care plan   [name], MD 10/14/2020 11:46 AM  INTERVAL HISTORY: Please see below for problem oriented charting. She returns to get cycle 1, day 8 of treatment with her husband She had several questions which she as related to how long she needs to be on treatment She denies side effects from therapy Denies nausea No recent changes in bowel habits Her husband has noted leg swelling and persistent ascites This past weekend, she felt hopeless at the prospect of continuing treatment  SUMMARY OF ONCOLOGIC HISTORY: Oncology History  Ovarian ca ([place])  07/22/2020 Imaging   Ct abdomen and pelvis 1. Large calcified mass in the pelvis that is new from 2010. There is associated large volume ascites with mild peritoneal nodularity/calcification, primarily worrisome for a calcifying ovarian malignancy. 2. Small right pleural effusion with few calcifications in the anterior juxta diaphragmatic space, possibly related to #1. 3. Nonenlarged but calcified periaortic lymph nodes  which are also worrisome for spread of disease.   07/29/2020 Procedure   Successful ultrasound-guided diagnostic and therapeutic paracentesis yielding 5 liters of peritoneal fluid.   07/29/2020 Tumor Marker   Patient's  tumor was tested for the following markers: CA-125 Results of the tumor marker test revealed 771   07/30/2020 Initial Diagnosis   Ovarian ca ([place])   07/31/2020 Imaging   1. Calcified periesophageal and juxtadiaphragmatic nodules are indicative of metastatic disease. 2. Small right and trace left pleural effusions. 3. Ascites and calcified upper abdominal adenopathy/peritoneal nodules, better assessed on 07/22/2020. 4. Aortic atherosclerosis (ICD10-I70.0). Coronary artery calcification.   07/31/2020 Cancer Staging   Staging form: Ovary, Fallopian Tube, and Primary Peritoneal Carcinoma, AJCC 8th Edition - Clinical stage from 07/31/2020: Stage IV (cT3, cN1, cM1) - Signed by [name], MD on 07/31/2020 Stage prefix: Initial diagnosis   08/01/2020 Tumor Marker   Patient's tumor was tested for the following markers: CA-125 Results of the tumor marker test revealed 641   08/05/2020 - 08/26/2020 Chemotherapy         08/26/2020 Tumor Marker   Patient's tumor was tested for the following markers: CA-125 Results of the tumor marker test revealed 663.   09/03/2020 Procedure   Successful ultrasound-guided therapeutic paracentesis yielding 5 liters of peritoneal fluid.   09/09/2020 - 09/14/2020 [facility]   She was [facility] to [facility] for management of subacute bowel obstruction   09/10/2020 Imaging   A total of approximately 2.9 L of clear yellow fluid was removed.   09/11/2020 Imaging   1. Small bowel obstruction with a transition zone seen within the posterolateral aspect of the left lower quadrant. 2. Large, stable calcified pelvic mass consistent with the patient's known history of ovarian cancer. 3. Mild-to-moderate amount of free fluid within the abdomen and pelvis. 4. Stable hepatic and left renal cysts. 5. Total right hip replacement. 6. Approximately 5 mm anterolisthesis of the L5 vertebral body on S1.   09/13/2020 Imaging   B/L venous Doppler US Right:  No evidence of deep vein  thrombosis in the upper extremity. Findings consistent with acute superficial vein thrombosis involving the right forearm portion of the basilic vein.     Left:  No evidence of thrombosis in the subclavian.   10/07/2020 -  Chemotherapy    Patient is on Treatment Plan: OVARIAN GEMCITABINE D1,8 Q21D      10/07/2020 Tumor Marker   Patient's tumor was tested for the following markers: CA-125. Results of the tumor marker test revealed 544.     REVIEW OF SYSTEMS:   Constitutional: Denies fevers, chills or abnormal weight loss Eyes: Denies blurriness of vision Ears, nose, mouth, throat, and face: Denies mucositis or sore throat Respiratory: Denies cough, dyspnea or wheezes Cardiovascular: Denies palpitation, chest discomfort  Skin: Denies abnormal skin rashes Lymphatics: Denies new lymphadenopathy or easy bruising Neurological:Denies numbness, tingling or new weaknesses All other systems were reviewed with the patient and are negative.  I have reviewed the past medical history, past surgical history, social history and family history with the patient and they are unchanged from previous note.  ALLERGIES:  is allergic to cephalexin, hydrocodone-guaifenesin, tape, and tramadol hcl.  MEDICATIONS:  Current Outpatient Medications  Medication Sig Dispense Refill  . mirtazapine (REMERON) 15 MG tablet Take 1 tablet (15 mg total) by mouth at bedtime. 30 tablet 2  . magnesium oxide (MAG-OX) 400 MG tablet Take 400 mg by mouth daily.    . Multiple Vitamin (MULTI-VITAMINS) TABS Take 1 tablet  by mouth daily.    . ondansetron (ZOFRAN) 8 MG tablet TAKE 1 TABLET BY MOUTH EVERY 8 HOURS AS NEEDED FOR REFRACTORY NAUSEA AND/OR VOMITING. START ON DAY 3 AFTER CARBOPLATIN CHEMO (Patient not taking: No sig reported) 30 tablet 1  . oxyCODONE (ROXICODONE) 5 MG immediate release tablet Take 1 tablet (5 mg total) by mouth every 4 (four) hours as needed for severe pain. 30 tablet 0  . polyethylene glycol (MIRALAX /  GLYCOLAX) 17 g packet Take 17 g by mouth daily as needed for severe constipation. 14 each 0  . pravastatin (PRAVACHOL) 20 MG tablet Take 1 tablet (20 mg total) by mouth daily. (Patient not taking: No sig reported) 90 tablet 3  . prochlorperazine (COMPAZINE) 10 MG tablet Take 1 tablet (10 mg total) by mouth every 6 (six) hours as needed (Nausea or vomiting). 5 tablet 0   No current facility-administered medications for this visit.   Facility-Administered Medications Ordered in Other Visits  Medication Dose Route Frequency Provider Last Rate Last Admin  . gemcitabine (GEMZAR) 1,254 mg in sodium chloride 0.9 % 250 mL chemo infusion  800 mg/m2 (Treatment Plan Recorded) Intravenous Once [name], MD        PHYSICAL EXAMINATION: ECOG PERFORMANCE STATUS: 2 - Symptomatic, <50% confined to bed  Vitals:   10/14/20 1028  BP: 132/70  Pulse: 77  Resp: 18  Temp: 98.2 F (36.8 C)  SpO2: 100%   Filed Weights   10/14/20 1028  Weight: 138 lb 6.4 oz (62.8 kg)    GENERAL:alert, no distress and comfortable SKIN: skin color, texture, turgor are normal, no rashes or significant lesions EYES: normal, Conjunctiva are pink and non-injected, sclera clear OROPHARYNX:no exudate, no erythema and lips, buccal mucosa, and tongue normal  NECK: supple, thyroid normal size, non-tender, without nodularity LYMPH:  no palpable lymphadenopathy in the cervical, axillary or inguinal LUNGS: clear to auscultation and percussion with normal breathing effort HEART: regular rate & rhythm and no murmurs with moderate bilateral lower extremity edema ABDOMEN:abdomen soft, distended with ascites Musculoskeletal:no cyanosis of digits and no clubbing  NEURO: alert & oriented x 3 with fluent speech, no focal motor/sensory deficits  LABORATORY DATA:  I have reviewed the data as listed    Component Value Date/Time   NA 140 10/14/2020 0932   NA 141 05/29/2019 1143   K 4.0 10/14/2020 0932   CL 103 10/14/2020 0932   CO2 25  10/14/2020 0932   GLUCOSE 131 (H) 10/14/2020 0932   BUN 17 10/14/2020 0932   BUN 29 (H) 05/29/2019 1143   CREATININE 0.73 10/14/2020 0932   CALCIUM 8.5 (L) 10/14/2020 0932   PROT 5.9 (L) 10/14/2020 0932   ALBUMIN 2.8 (L) 10/14/2020 0932   AST 14 (L) 10/14/2020 0932   ALT 16 10/14/2020 0932   ALKPHOS 150 (H) 10/14/2020 0932   BILITOT <0.2 (L) 10/14/2020 0932   GFRNONAA >60 10/14/2020 0932   GFRAA 74 05/29/2019 1143    No results found for: SPEP, UPEP  Lab Results  Component Value Date   WBC 4.6 10/14/2020   NEUTROABS 3.5 10/14/2020   HGB 9.9 (L) 10/14/2020   HCT 30.0 (L) 10/14/2020   MCV 86.5 10/14/2020   PLT 198 10/14/2020      Chemistry      Component Value Date/Time   NA 140 10/14/2020 0932   NA 141 05/29/2019 1143   K 4.0 10/14/2020 0932   CL 103 10/14/2020 0932   CO2 25 10/14/2020 0932   BUN  17 10/14/2020 0932   BUN 29 (H) 05/29/2019 1143   CREATININE 0.73 10/14/2020 0932      Component Value Date/Time   CALCIUM 8.5 (L) 10/14/2020 0932   ALKPHOS 150 (H) 10/14/2020 0932   AST 14 (L) 10/14/2020 0932   ALT 16 10/14/2020 0932   BILITOT <0.2 (L) 10/14/2020 0932

## 2020-10-14 NOTE — Assessment & Plan Note (Signed)
She has diffuse bilateral lower extremity edema This is due to low protein status and reduced activity Recommend observation only at this point I do not recommend diuretic therapy She is not likely to tolerate elastic compression hose

## 2020-10-14 NOTE — Progress Notes (Signed)
[place] Work  Clinical Social Work was referred by Therapist, sports for assessment of psychosocial needs.  Clinical Social Worker contacted patient by phone and spoke with pt and husband  to offer support and assess for needs.    Per patient, she has been struggling with knowing what hope to have due to changes in information. She is also frustrated with what she voiced as lack of clarity around length of treatments and with process of even being diagnosed to begin with with having to go to her PCP multiple times to find out anything.  Husband voiced concern that Alee is having increased depression with lack of hope and becoming weepy. They are working on getting a Nutritional therapist through [employer] per husband.    CSW offered information on support services, including support groups for pt and husband. Unfortunately due to groups being virtual, they declined as they are unable to do Zoom.  CSW also reviewed options for one-on-one supportive counseling through social work or spiritual care. They opted to speak with chaplain and stated that they have her information and will call directly.    [place], [place] Worker [employer]

## 2020-10-14 NOTE — Assessment & Plan Note (Signed)
Despite some weight gain, her protein status has declined Reinforced again the importance of adequate oral intake and high-protein diet

## 2020-10-14 NOTE — Patient Instructions (Signed)
[facility] Discharge Instructions for Patients Receiving Chemotherapy  Today you received the following chemotherapy agents: gemcitabine.  To help prevent nausea and vomiting after your treatment, we encourage you to take your nausea medication as directed.   If you develop nausea and vomiting that is not controlled by your nausea medication, call the clinic.   BELOW ARE SYMPTOMS THAT SHOULD BE REPORTED IMMEDIATELY:  *FEVER GREATER THAN 100.5 F  *CHILLS WITH OR WITHOUT FEVER  NAUSEA AND VOMITING THAT IS NOT CONTROLLED WITH YOUR NAUSEA MEDICATION  *UNUSUAL SHORTNESS OF BREATH  *UNUSUAL BRUISING OR BLEEDING  TENDERNESS IN MOUTH AND THROAT WITH OR WITHOUT PRESENCE OF ULCERS  *URINARY PROBLEMS  *BOWEL PROBLEMS  UNUSUAL RASH Items with * indicate a potential emergency and should be followed up as soon as possible.  Feel free to call the clinic should you have any questions or concerns. The clinic phone number is [phone].  Please show the [facility] at check-in to the Emergency Department and triage nurse.  Gemcitabine injection What is this medicine? GEMCITABINE (jem SYE ta been) is a chemotherapy drug. This medicine is used to treat many types of cancer like breast cancer, lung cancer, pancreatic cancer, and ovarian cancer. This medicine may be used for other purposes; ask your health care provider or pharmacist if you have questions. COMMON BRAND NAME(S): Gemzar, Infugem What should I tell my health care provider before I take this medicine? They need to know if you have any of these conditions:  blood disorders  infection  kidney disease  liver disease  lung or breathing disease, like asthma  recent or ongoing radiation therapy  an unusual or allergic reaction to gemcitabine, other chemotherapy, other medicines, foods, dyes, or preservatives  pregnant or trying to get pregnant  breast-feeding How should I use this medicine? This  drug is given as an infusion into a vein. It is administered in a hospital or clinic by a specially trained health care professional. Talk to your pediatrician regarding the use of this medicine in children. Special care may be needed. Overdosage: If you think you have taken too much of this medicine contact a poison control center or emergency room at once. NOTE: This medicine is only for you. Do not share this medicine with others. What if I miss a dose? It is important not to miss your dose. Call your doctor or health care professional if you are unable to keep an appointment. What may interact with this medicine?  medicines to increase blood counts like filgrastim, pegfilgrastim, sargramostim  some other chemotherapy drugs like cisplatin  vaccines Talk to your doctor or health care professional before taking any of these medicines:  acetaminophen  aspirin  ibuprofen  ketoprofen  naproxen This list may not describe all possible interactions. Give your health care provider a list of all the medicines, herbs, non-prescription drugs, or dietary supplements you use. Also tell them if you smoke, drink alcohol, or use illegal drugs. Some items may interact with your medicine. What should I watch for while using this medicine? Visit your doctor for checks on your progress. This drug may make you feel generally unwell. This is not uncommon, as chemotherapy can affect healthy cells as well as cancer cells. Report any side effects. Continue your course of treatment even though you feel ill unless your doctor tells you to stop. In some cases, you may be given additional medicines to help with side effects. Follow all directions for their use. Call  your doctor or health care professional for advice if you get a fever, chills or sore throat, or other symptoms of a cold or flu. Do not treat yourself. This drug decreases your body's ability to fight infections. Try to avoid being around people who  are sick. This medicine may increase your risk to bruise or bleed. Call your doctor or health care professional if you notice any unusual bleeding. Be careful brushing and flossing your teeth or using a toothpick because you may get an infection or bleed more easily. If you have any dental work done, tell your dentist you are receiving this medicine. Avoid taking products that contain aspirin, acetaminophen, ibuprofen, naproxen, or ketoprofen unless instructed by your doctor. These medicines may hide a fever. Do not become pregnant while taking this medicine or for 6 months after stopping it. Women should inform their doctor if they wish to become pregnant or think they might be pregnant. Men should not father a child while taking this medicine and for 3 months after stopping it. There is a potential for serious side effects to an unborn child. Talk to your health care professional or pharmacist for more information. Do not breast-feed an infant while taking this medicine or for at least 1 week after stopping it. Men should inform their doctors if they wish to father a child. This medicine may lower sperm counts. Talk with your doctor or health care professional if you are concerned about your fertility. What side effects may I notice from receiving this medicine? Side effects that you should report to your doctor or health care professional as soon as possible:  allergic reactions like skin rash, itching or hives, swelling of the face, lips, or tongue  breathing problems  pain, redness, or irritation at site where injected  signs and symptoms of a dangerous change in heartbeat or heart rhythm like chest pain; dizziness; fast or irregular heartbeat; palpitations; feeling faint or lightheaded, falls; breathing problems  signs of decreased platelets or bleeding - bruising, pinpoint red spots on the skin, black, tarry stools, blood in the urine  signs of decreased red blood cells - unusually weak or  tired, feeling faint or lightheaded, falls  signs of infection - fever or chills, cough, sore throat, pain or difficulty passing urine  signs and symptoms of kidney injury like trouble passing urine or change in the amount of urine  signs and symptoms of liver injury like dark yellow or brown urine; general ill feeling or flu-like symptoms; light-colored stools; loss of appetite; nausea; right upper belly pain; unusually weak or tired; yellowing of the eyes or skin  swelling of ankles, feet, hands Side effects that usually do not require medical attention (report to your doctor or health care professional if they continue or are bothersome):  constipation  diarrhea  hair loss  loss of appetite  nausea  rash  vomiting This list may not describe all possible side effects. Call your doctor for medical advice about side effects. You may report side effects to FDA at 1-800-FDA-1088. Where should I keep my medicine? This drug is given in a hospital or clinic and will not be stored at home. NOTE: This sheet is a summary. It may not cover all possible information. If you have questions about this medicine, talk to your doctor, pharmacist, or health care provider.  2021 Elsevier/Gold Standard (2017-09-08 18:06:11)

## 2020-10-15 ENCOUNTER — Telehealth: Payer: Self-pay

## 2020-10-15 NOTE — Telephone Encounter (Signed)
Returned call to husband. He is asking about different proteins his wife can drink to increase albumin levels. Reviewed different proteins. He verbalized understanding.

## 2020-10-16 ENCOUNTER — Ambulatory Visit: Payer: Medicare Other | Admitting: Physical Therapy

## 2020-10-16 ENCOUNTER — Telehealth: Payer: Self-pay | Admitting: Hematology and Oncology

## 2020-10-16 ENCOUNTER — Other Ambulatory Visit: Payer: Self-pay

## 2020-10-16 DIAGNOSIS — R269 Unspecified abnormalities of gait and mobility: Secondary | ICD-10-CM

## 2020-10-16 DIAGNOSIS — Z9181 History of falling: Secondary | ICD-10-CM

## 2020-10-16 DIAGNOSIS — M6281 Muscle weakness (generalized): Secondary | ICD-10-CM | POA: Diagnosis not present

## 2020-10-16 NOTE — Therapy (Signed)
[place], [place], [address] Phone: [phone]   Fax:  [phone]  Physical Therapy Treatment  Patient Details  Name: [name] MRN: [id number] Date of Birth: [birth year] Referring Provider (PT): Dr. Alvy Bimler   Encounter Date: 10/16/2020   PT End of Session - 10/16/20 1626    Visit Number 3    Number of Visits 17    Date for PT Re-Evaluation 12/02/20    PT Start Time 1200    PT Stop Time 1240    PT Time Calculation (min) 40 min    Activity Tolerance Patient tolerated treatment well    Behavior During Therapy [facility] for tasks assessed/performed           Past Medical History:  Diagnosis Date  . Arthritis    right shoulder replacement, right replacement   . Atrial fibrillation ([place])   . Cancer ([place])   . Chronic pancreatitis ([place])   . Constipation   . Osteopenia   . Pancreatitis 1990s  . Syncope 03/2011    Past Surgical History:  Procedure Laterality Date  . APPENDECTOMY  1955  . CHOLECYSTECTOMY  1998  . CYSTECTOMY  1981   [place]  . JOINT REPLACEMENT     right shoulder 2008; right hip 2016  . [place]  . TOTAL HIP ARTHROPLASTY Right 11/2014   [facility], [place], [place]    There were no vitals filed for this visit.   Subjective Assessment - 10/16/20 1206    Subjective Pt was able to open up the [place] exercises at home and was able to do them until she lost her energy during the chemo therapy    Pertinent History Ovarian cancer diagnosed 2021, has had chemotherapy with 2 doses and plans to have more.Pt has recently has malignant ascites with hospitalization for possible bowel obstruction. She has had weight loss. Past history included right hip replacement 2016; right shoulder replacement 2008.    Patient Stated Goals Pt says she wants to get back to doing the things she was able to do before.    Currently in Pain? No/denies                              [facility] Adult PT Treatment/Exercise - 10/16/20 0001      Self-Care   Self-Care Other Self-Care Comments    Other Self-Care Comments  discussed energy conservation techniques and scheduling in rest periods as well as increasing exercise on her off weeks of chemo "chemo-cycling"      Exercises   Exercises Shoulder;Lumbar;Knee/Hip;Ankle;Elbow      Elbow Exercises   Elbow Flexion Strengthening;Right;Left;15 reps      Knee/Hip Exercises: Supine   Terminal Knee Extension Strengthening;Right;Left;3 sets;5 reps    Bridges Strengthening;5 reps    Other Supine Knee/Hip Exercises marching each leg    Other Supine Knee/Hip Exercises isometrics 5 reps of abduction and adduction      Shoulder Exercises: Supine   Other Supine Exercises chest press with 5# 4 sets of 3 reps   also did chest press with dowel with not weight and 5 reps with 3#                 PT Education - 10/16/20 1625    Education Details energy conservation, scheduling rest breaks, trying to stay mobile for short bouts throughout the day. Elevation and exercise for lower extremity edema  managment    Person(s) Educated Patient;Spouse    Methods Explanation    Comprehension Verbalized understanding            PT Short Term Goals - 10/02/20 2036      PT SHORT TERM GOAL #1   Title Pt will report she is be able to do an intital HEP 5 times a week    Time 4    Period Weeks    Status New      PT SHORT TERM GOAL #2   Title Pt will be able to do 7 reps of sit to stand in 30 seconds indicting and improvment in functional strength    Baseline 5 reps  on 10/02/2020    Time 4    Period Weeks    Status New      PT SHORT TERM GOAL #3   Title Pt will be able to do a normal TUG in less than 14 seconds    Baseline 15.51 on 10/02/2020    Time 4    Period Weeks    Status New             PT Long Term Goals - 10/02/20 2038      PT LONG TERM GOAL #1   Title Pt will report she is able  to get in and out of bed with minimal assit from her husband .    Baseline pt needs mod assit to get in and out of bed on 10/02/2020    Time 8    Period Weeks    Status New      PT LONG TERM GOAL #2   Title Pt will be able to do 10 repetitions of sit to stand in 30 seconds    Baseline 5 reps on 10/02/2020    Time 8    Period Weeks    Status New      PT LONG TERM GOAL #3   Title Pt will increase gait speed to .8 meters per second demonstrating a decreased risk for fall compared to baseline    Baseline .65 meters per second on 10/01/2020    Time 8    Period Weeks    Status New      PT LONG TERM GOAL #4   Title Pt will have a home exercise program that she can follow through with on her own at home.    Time 8    Period Weeks    Status New                 Plan - 10/16/20 1626    Clinical Impression Statement Pt was able to exericse with good speed, effort and exericse quality and then, after about 25 minutes, she said she was tired and couldnt do any more.  She was assisted to sitting, rested and had some water and listened attentively to suggestions to do her activities of daily living in about 20 minutes blocks with a scheduled rest. She was also instruted in elevation and exercise for lower extremity edema managment . Pt acknowledged understanding    Personal Factors and Comorbidities Comorbidity 3+    Comorbidities ovarian cancer, malignant ascites, past hip and shoulder replacement, falls    Examination-Activity Limitations Bed Mobility;Bathing;Dressing;Transfers;Locomotion Level;Reach Overhead    Examination-Participation Restrictions Cleaning;Laundry;Community Activity;Driving    Stability/Clinical Decision Making Evolving/Moderate complexity    Rehab Potential Good    Clinical Impairments Affecting Rehab Potential ongoing chemo    PT Frequency 2x / week  PT Duration 8 weeks    PT Treatment/Interventions ADLs/Self Care Home Management;Therapeutic exercise;Therapeutic  activities;Functional mobility training;Stair training;Gait training;Patient/family education;Manual techniques;Energy conservation;Splinting;Passive range of motion;Neuromuscular re-education;Balance training;Manual lymph drainage;Compression bandaging    PT Next Visit Plan consider tg soft for legs if edema continues, perhaps edemawear???continue with  HEP progression, work on bed mobility, talk about energy conservation, time mangement in her day to include exercise and decrease sedentary behavior at home. progress to gait and balance training and more strengthening.    Consulted and Agree with Plan of Care Patient;Family member/caregiver    Family Member Consulted husband           Patient will benefit from skilled therapeutic intervention in order to improve the following deficits and impairments:  Decreased strength,Decreased mobility,Postural dysfunction,Improper body mechanics,Impaired flexibility,Decreased activity tolerance,Decreased endurance,Difficulty walking,Decreased range of motion,Abnormal gait,Decreased balance,Impaired sensation,Increased edema,Hypomobility,Decreased knowledge of use of DME,Decreased safety awareness,Impaired tone,Pain  Visit Diagnosis: Muscle weakness (generalized)  History of falling  Gait disturbance     Problem List Patient Active Problem List   Diagnosis Date Noted  . Malignant cachexia ([place]) 10/14/2020  . Mild depression ([place]) 10/14/2020  . Leg swelling 10/14/2020  . Anemia due to chronic illness 10/07/2020  . Mild protein malnutrition ([place]) 10/07/2020  . Physical debility 09/25/2020  . Lower back pain 09/18/2020  . Goals of care, counseling/discussion 09/18/2020  . Abdominal pain 09/09/2020  . Intractable nausea and vomiting 09/09/2020  . Elevated LFTs 09/09/2020  . Nasal congestion 09/02/2020  . Anorexia 09/02/2020  . Generalized weakness 09/02/2020  . Weight loss, abnormal 07/31/2020  . Ovarian ca ([place]) 07/30/2020  . Pelvic mass  07/26/2020  . Malignant neoplasm of other specified female genital organs ([place])  07/26/2020  . Chronic kidney disease, stage 3 unspecified ([place]) 07/24/2020  . Allergic rhinitis 07/24/2020  . Amnesia 07/24/2020  . Malignant ascites 07/24/2020  . Chronic sinusitis 07/24/2020  . Constipation 07/24/2020  . Eczema 07/24/2020  . Hearing loss 07/24/2020  . History of pancreatitis 07/24/2020  . Iron deficiency 07/24/2020  . Knee pain 07/24/2020  . Nocturia 07/24/2020  . Osteopenia 07/24/2020  . Polymyalgia rheumatica ([place]) 07/24/2020  . Pure hypercholesterolemia 07/24/2020  . Rosacea 07/24/2020  . Sacroiliitis, not elsewhere classified ([place]) 07/24/2020  . Sleep disturbance 07/24/2020  . Spontaneous ecchymosis 07/24/2020  . Vitamin D deficiency 07/24/2020  . Prolapsed internal hemorrhoids 11/04/2017  . Primary osteoarthritis of both shoulders 07/09/2016  . Status post replacement of right shoulder joint 07/09/2016  . Primary osteoarthritis of one hip, right 12/25/2014  . Dermatochalasis 07/22/2012  . Atrial fibrillation ([place]) 09/17/2011  . Hyperlipidemia 09/17/2011   Donato Heinz. Owens Shark PT  [place]o 10/16/2020, 4:31 PM  [place], [place], [address] Phone: [phone]   Fax:  [phone]  Name: DEALVA LAFOY MRN: [id number] Date of Birth: March 21, 1941

## 2020-10-16 NOTE — Telephone Encounter (Signed)
Appts scheduled per 4/18 sch msg. Pt's husband is aware.

## 2020-10-21 ENCOUNTER — Other Ambulatory Visit: Payer: Self-pay | Admitting: Hematology and Oncology

## 2020-10-21 ENCOUNTER — Telehealth: Payer: Self-pay

## 2020-10-21 DIAGNOSIS — C569 Malignant neoplasm of unspecified ovary: Secondary | ICD-10-CM

## 2020-10-21 DIAGNOSIS — R180 Malignant ascites: Secondary | ICD-10-CM

## 2020-10-21 NOTE — Telephone Encounter (Signed)
I placed order for urgent paracentesis today or tomorrow or Wed

## 2020-10-21 NOTE — Telephone Encounter (Signed)
Called husband and given appt for Paracentesis tomorrow at 1:30 pm at [facility], instructed to go to main entrance of [facility] and after paracentesis come to [facility] for appt with Dr. Alvy Bimler. Husband verbalized understanding.

## 2020-10-21 NOTE — Telephone Encounter (Signed)
Husband called and left a message to call him.  Called back. [name] started having pain on the right side of abdomen yesterday. She was able to take tylenol and oxycodone Rx and it helped. Yesterday she started spitting up clear liquids into a tissue. She is not able to eat/ drink today. Denies constipation and [name] thinks the last BM was yesterday.She took Compazine at 7 am today. She has not taken any Zofran Rx. Instructed to take compazine and Zofran together/ alternate per Rx instructions. Her abdomen is distended and her thinks she may need a paracentesis, but will wait and discuss with Dr. Alvy Bimler tomorrow. Husband verbalized understanding. Reminded of appt time tomorrow.

## 2020-10-22 ENCOUNTER — Ambulatory Visit (HOSPITAL_COMMUNITY)
Admission: RE | Admit: 2020-10-22 | Discharge: 2020-10-22 | Disposition: A | Discharge status: Home / Self Care | Payer: Medicare Other | Source: Ambulatory Visit | Attending: Hematology and Oncology | Admitting: Hematology and Oncology

## 2020-10-22 ENCOUNTER — Inpatient Hospital Stay (HOSPITAL_BASED_OUTPATIENT_CLINIC_OR_DEPARTMENT_OTHER): Payer: Medicare Other | Admitting: Hematology and Oncology

## 2020-10-22 ENCOUNTER — Encounter: Payer: Self-pay | Admitting: Hematology and Oncology

## 2020-10-22 ENCOUNTER — Other Ambulatory Visit: Payer: Self-pay

## 2020-10-22 DIAGNOSIS — R180 Malignant ascites: Secondary | ICD-10-CM

## 2020-10-22 DIAGNOSIS — M7989 Other specified soft tissue disorders: Secondary | ICD-10-CM

## 2020-10-22 DIAGNOSIS — R112 Nausea with vomiting, unspecified: Secondary | ICD-10-CM | POA: Diagnosis not present

## 2020-10-22 DIAGNOSIS — Z5111 Encounter for antineoplastic chemotherapy: Secondary | ICD-10-CM | POA: Diagnosis not present

## 2020-10-22 DIAGNOSIS — C569 Malignant neoplasm of unspecified ovary: Secondary | ICD-10-CM | POA: Diagnosis not present

## 2020-10-22 DIAGNOSIS — R634 Abnormal weight loss: Secondary | ICD-10-CM | POA: Diagnosis not present

## 2020-10-22 MED ORDER — OXYCODONE HCL 5 MG PO TABS: 5.0000 mg | ORAL_TABLET | ORAL | 0 refills | Status: DC | PRN

## 2020-10-22 MED ORDER — LIDOCAINE HCL 1 % IJ SOLN
INTRAMUSCULAR | Status: AC
  Filled 2020-10-22: qty 20

## 2020-10-22 NOTE — Assessment & Plan Note (Signed)
She has declined rapidly Within the span of a week, she has lost a lot of weight, combination from loss of appetite, nausea with vomiting and recent paracentesis She would like to continue on aggressive supportive care I will see her again next week for further follow-up

## 2020-10-22 NOTE — Assessment & Plan Note (Signed)
She had recent nausea with vomiting She has not been taking her antiemetics on a regular basis I encouraged her to do so

## 2020-10-22 NOTE — Assessment & Plan Note (Signed)
Leg swelling is caused by severe protein calorie malnutrition and poor mobility Observe for now

## 2020-10-22 NOTE — Procedures (Signed)
Ultrasound-guided  therapeutic paracentesis performed yielding 5 liters (maximum ordered) of yellow fluid. No immediate complications.EBL none.   

## 2020-10-22 NOTE — Assessment & Plan Note (Signed)
She had approximately 5 L of malignant ascites removed Hopefully, this will allow her to eat better

## 2020-10-22 NOTE — Assessment & Plan Note (Signed)
That would explain approximately 11 pounds weight loss but the patient has lost more than that Her husband stated that she also have 2 heavy jackets the last time she was weight Moving forward, we discussed the importance of frequent small meals She will continue Remeron We can potentially increase dose as needed in her next visit

## 2020-10-22 NOTE — Progress Notes (Signed)
[facility] OFFICE PROGRESS NOTE  Patient Care Team: [name], MD as PCP - General (Family Medicine) [name], MD as Consulting Physician (Cardiology)  ASSESSMENT & PLAN:  Ovarian ca [facility]) She has declined rapidly Within the span of a week, she has lost a lot of weight, combination from loss of appetite, nausea with vomiting and recent paracentesis She would like to continue on aggressive supportive care I will see her again next week for further follow-up  Malignant ascites She had approximately 5 L of malignant ascites removed Hopefully, this will allow her to eat better  Weight loss, abnormal That would explain approximately 11 pounds weight loss but the patient has lost more than that Her husband stated that she also have 2 heavy jackets the last time she was weight Moving forward, we discussed the importance of frequent small meals She will continue Remeron We can potentially increase dose as needed in her next visit   Leg swelling Leg swelling is caused by severe protein calorie malnutrition and poor mobility Observe for now  Nausea with vomiting She had recent nausea with vomiting She has not been taking her antiemetics on a regular basis I encouraged her to do so   No orders of the defined types were placed in this encounter.   All questions were answered. The patient knows to call the clinic with any problems, questions or concerns. The total time spent in the appointment was 40 minutes encounter with patients including review of chart and various tests results, discussions about plan of care and coordination of care plan   [name], MD 10/22/2020 3:27 PM  INTERVAL HISTORY: Please see below for problem oriented charting. She returns with her husband and daughter, [name] for follow-up She just had paracentesis performed prior to the appointment She is noted to have lost 15-1/2 pounds Her husband stated she had some problems with  nausea and vomiting Sunday night and Monday morning Her nausea vomiting has resolved She has intermittent constipation She takes oxycodone as needed for pain once a day She does not sleep well She still have a very major difficulties tolerating oral diet She does not eat much with each meal and is difficult for family to choose the right foot for her of that with appeal to her She also does not want to eat when her stomach does not feel well but she has not tried taking antiemetics before meals At the end of the meeting, her husband left and I have further discussion with her daughter, [name]  SUMMARY OF ONCOLOGIC HISTORY: Oncology History  Ovarian ca ([place])  07/22/2020 Imaging   Ct abdomen and pelvis 1. Large calcified mass in the pelvis that is new from 2010. There is associated large volume ascites with mild peritoneal nodularity/calcification, primarily worrisome for a calcifying ovarian malignancy. 2. Small right pleural effusion with few calcifications in the anterior juxta diaphragmatic space, possibly related to #1. 3. Nonenlarged but calcified periaortic lymph nodes which are also worrisome for spread of disease.   07/29/2020 Procedure   Successful ultrasound-guided diagnostic and therapeutic paracentesis yielding 5 liters of peritoneal fluid.   07/29/2020 Tumor Marker   Patient's tumor was tested for the following markers: CA-125 Results of the tumor marker test revealed 771   07/30/2020 Initial Diagnosis   Ovarian ca ([place])   07/31/2020 Imaging   1. Calcified periesophageal and juxtadiaphragmatic nodules are indicative of metastatic disease. 2. Small right and trace left pleural effusions. 3. Ascites and calcified upper  abdominal adenopathy/peritoneal nodules, better assessed on 07/22/2020. 4. Aortic atherosclerosis (ICD10-I70.0). Coronary artery calcification.   07/31/2020 Cancer Staging   Staging form: Ovary, Fallopian Tube, and Primary Peritoneal Carcinoma, AJCC 8th Edition -  Clinical stage from 07/31/2020: Stage IV (cT3, cN1, cM1) - Signed by [name], MD on 07/31/2020 Stage prefix: Initial diagnosis   08/01/2020 Tumor Marker   Patient's tumor was tested for the following markers: CA-125 Results of the tumor marker test revealed 641   08/05/2020 - 08/26/2020 Chemotherapy         08/26/2020 Tumor Marker   Patient's tumor was tested for the following markers: CA-125 Results of the tumor marker test revealed 663.   09/03/2020 Procedure   Successful ultrasound-guided therapeutic paracentesis yielding 5 liters of peritoneal fluid.   09/09/2020 - 09/14/2020 Hospital Admission   She was admitted to the hospital for management of subacute bowel obstruction   09/10/2020 Imaging   A total of approximately 2.9 L of clear yellow fluid was removed.   09/11/2020 Imaging   1. Small bowel obstruction with a transition zone seen within the posterolateral aspect of the left lower quadrant. 2. Large, stable calcified pelvic mass consistent with the patient's known history of ovarian cancer. 3. Mild-to-moderate amount of free fluid within the abdomen and pelvis. 4. Stable hepatic and left renal cysts. 5. Total right hip replacement. 6. Approximately 5 mm anterolisthesis of the L5 vertebral body on S1.   09/13/2020 Imaging   B/L venous Doppler US Right:  No evidence of deep vein thrombosis in the upper extremity. Findings consistent with acute superficial vein thrombosis involving the right forearm portion of the basilic vein.     Left:  No evidence of thrombosis in the subclavian.   10/07/2020 -  Chemotherapy    Patient is on Treatment Plan: OVARIAN GEMCITABINE D1,8 Q21D      10/07/2020 Tumor Marker   Patient's tumor was tested for the following markers: CA-125. Results of the tumor marker test revealed 544.     REVIEW OF SYSTEMS:   Constitutional: Denies fevers, chills  Eyes: Denies blurriness of vision Ears, nose, mouth, throat, and face: Denies mucositis or sore  throat Respiratory: Denies cough, dyspnea or wheezes Cardiovascular: Denies palpitation, chest discomfort Skin: Denies abnormal skin rashes Lymphatics: Denies new lymphadenopathy or easy bruising Neurological:Denies numbness, tingling or new weaknesses Behavioral/Psych: Mood is stable, no new changes  All other systems were reviewed with the patient and are negative.  I have reviewed the past medical history, past surgical history, social history and family history with the patient and they are unchanged from previous note.  ALLERGIES:  is allergic to cephalexin, hydrocodone-guaifenesin, tape, and tramadol hcl.  MEDICATIONS:  Current Outpatient Medications  Medication Sig Dispense Refill  . magnesium oxide (MAG-OX) 400 MG tablet Take 400 mg by mouth daily.    . mirtazapine (REMERON) 15 MG tablet Take 1 tablet (15 mg total) by mouth at bedtime. 30 tablet 2  . Multiple Vitamin (MULTI-VITAMINS) TABS Take 1 tablet by mouth daily.    . ondansetron (ZOFRAN) 8 MG tablet TAKE 1 TABLET BY MOUTH EVERY 8 HOURS AS NEEDED FOR REFRACTORY NAUSEA AND/OR VOMITING. START ON DAY 3 AFTER CARBOPLATIN CHEMO (Patient not taking: No sig reported) 30 tablet 1  . oxyCODONE (ROXICODONE) 5 MG immediate release tablet Take 1 tablet (5 mg total) by mouth every 4 (four) hours as needed for severe pain. 30 tablet 0  . polyethylene glycol (MIRALAX / GLYCOLAX) 17 g packet Take 17 g by  mouth daily as needed for severe constipation. 14 each 0  . pravastatin (PRAVACHOL) 20 MG tablet Take 1 tablet (20 mg total) by mouth daily. (Patient not taking: No sig reported) 90 tablet 3  . prochlorperazine (COMPAZINE) 10 MG tablet Take 1 tablet (10 mg total) by mouth every 6 (six) hours as needed (Nausea or vomiting). 5 tablet 0   No current facility-administered medications for this visit.   Facility-Administered Medications Ordered in Other Visits  Medication Dose Route Frequency Provider Last Rate Last Admin  . lidocaine (XYLOCAINE)  1 % (with pres) injection             PHYSICAL EXAMINATION: ECOG PERFORMANCE STATUS: 2 - Symptomatic, <50% confined to bed  Vitals:   10/22/20 1433  BP: 117/73  Pulse: 88  Resp: 16  Temp: (!) 97.2 F (36.2 C)  SpO2: 100%   Filed Weights   10/22/20 1433  Weight: 122 lb 9.6 oz (55.6 kg)    GENERAL:alert, no distress and comfortable.  She looks thin and cachectic ABDOMEN:abdomen is still distended with ascites.  Bilateral lower extremity edema is noted NEURO: alert & oriented x 3 with fluent speech, no focal motor/sensory deficits  LABORATORY DATA:  I have reviewed the data as listed    Component Value Date/Time   NA 140 10/14/2020 0932   NA 141 05/29/2019 1143   K 4.0 10/14/2020 0932   CL 103 10/14/2020 0932   CO2 25 10/14/2020 0932   GLUCOSE 131 (H) 10/14/2020 0932   BUN 17 10/14/2020 0932   BUN 29 (H) 05/29/2019 1143   CREATININE 0.73 10/14/2020 0932   CALCIUM 8.5 (L) 10/14/2020 0932   PROT 5.9 (L) 10/14/2020 0932   ALBUMIN 2.8 (L) 10/14/2020 0932   AST 14 (L) 10/14/2020 0932   ALT 16 10/14/2020 0932   ALKPHOS 150 (H) 10/14/2020 0932   BILITOT <0.2 (L) 10/14/2020 0932   GFRNONAA >60 10/14/2020 0932   GFRAA 74 05/29/2019 1143    No results found for: SPEP, UPEP  Lab Results  Component Value Date   WBC 4.6 10/14/2020   NEUTROABS 3.5 10/14/2020   HGB 9.9 (L) 10/14/2020   HCT 30.0 (L) 10/14/2020   MCV 86.5 10/14/2020   PLT 198 10/14/2020      Chemistry      Component Value Date/Time   NA 140 10/14/2020 0932   NA 141 05/29/2019 1143   K 4.0 10/14/2020 0932   CL 103 10/14/2020 0932   CO2 25 10/14/2020 0932   BUN 17 10/14/2020 0932   BUN 29 (H) 05/29/2019 1143   CREATININE 0.73 10/14/2020 0932      Component Value Date/Time   CALCIUM 8.5 (L) 10/14/2020 0932   ALKPHOS 150 (H) 10/14/2020 0932   AST 14 (L) 10/14/2020 0932   ALT 16 10/14/2020 0932   BILITOT <0.2 (L) 10/14/2020 0932

## 2020-10-23 ENCOUNTER — Ambulatory Visit: Payer: Medicare Other | Admitting: Physical Therapy

## 2020-10-23 ENCOUNTER — Encounter: Payer: Self-pay | Admitting: Physical Therapy

## 2020-10-23 DIAGNOSIS — R269 Unspecified abnormalities of gait and mobility: Secondary | ICD-10-CM | POA: Diagnosis not present

## 2020-10-23 DIAGNOSIS — Z9181 History of falling: Secondary | ICD-10-CM

## 2020-10-23 DIAGNOSIS — M6281 Muscle weakness (generalized): Secondary | ICD-10-CM | POA: Diagnosis not present

## 2020-10-23 NOTE — Therapy (Signed)
[place], [place], [address] Phone: [phone]   Fax:  [phone]  Physical Therapy Treatment  Patient Details  Name: [name] MRN: [id number] Date of Birth: 11/12/1940 Referring Provider (PT): Dr. Alvy Bimler   Encounter Date: 10/23/2020   PT End of Session - 10/23/20 1437    Visit Number 4    Number of Visits 17    Date for PT Re-Evaluation 12/02/20    PT Start Time 1400    PT Stop Time 1430   pt fatigued, needed to stop PT   PT Time Calculation (min) 30 min    Activity Tolerance Patient tolerated treatment well    Behavior During Therapy [facility] for tasks assessed/performed           Past Medical History:  Diagnosis Date  . Arthritis    right shoulder replacement, right replacement   . Atrial fibrillation ([place])   . Cancer ([place])   . Chronic pancreatitis ([place])   . Constipation   . Osteopenia   . Pancreatitis 1990s  . Syncope 03/2011    Past Surgical History:  Procedure Laterality Date  . APPENDECTOMY  1955  . CHOLECYSTECTOMY  1998  . CYSTECTOMY  1981   [place]  . JOINT REPLACEMENT     right shoulder 2008; right hip 2016  . [place]  . TOTAL HIP ARTHROPLASTY Right 11/2014   [facility], [place], [place]    There were no vitals filed for this visit.   Subjective Assessment - 10/23/20 1411    Subjective Pt said that she has 5 liters drained off her stomach yesterday.  She is feeling better today.  She is still having swelling in her legs    Pertinent History Ovarian cancer diagnosed 2021, has had chemotherapy with 2 doses and plans to have more.Pt has recently has malignant ascites with hospitalization for possible bowel obstruction. She has had weight loss. Past history included right hip replacement 2016; right shoulder replacement 2008.    Patient Stated Goals Pt says she wants to get back to doing the things she was able to do before.    Currently in  Pain? Yes    Pain Score 2     Pain Location Back                             [facility] Adult PT Treatment/Exercise - 10/23/20 0001      Bed Mobility   Bed Mobility Rolling Right;Rolling Left    Rolling Right Minimal Assistance - Patient > 75%    Rolling Left Minimal Assistance - Patient > 75%   educated on doing a half bridge to push hip up and over to initiate rolling     Self-Care   Other Self-Care Comments  medicum tg soft to left leg with large fold at the top for comfort to see if that would help decrease swelling. Pt instructed to avoid letting it constrict on arm and to take it off after dinner to assess if it helped decrease edema and to use as needed for comfort.      Knee/Hip Exercises: Standing   Functional Squat 1 set;5 reps    Other Standing Knee Exercises hip hinge x 5 reps    Other Standing Knee Exercises lateral stepping x 5 steps with fingers on counter for balance      Knee/Hip Exercises: Supine   Short Arc Quad  Sets Strengthening;Right;Left;10 reps    Bridges Strengthening;5 reps    Single Leg Bridge Strengthening;Right;Left;5 reps    Other Supine Knee/Hip Exercises knee to chest 2 sets of 5 wtih each leg      Knee/Hip Exercises: Sidelying   Hip ABduction Right;Left;Strengthening;5 reps                    PT Short Term Goals - 10/02/20 2036      PT SHORT TERM GOAL #1   Title Pt will report she is be able to do an intital HEP 5 times a week    Time 4    Period Weeks    Status New      PT SHORT TERM GOAL #2   Title Pt will be able to do 7 reps of sit to stand in 30 seconds indicting and improvment in functional strength    Baseline 5 reps  on 10/02/2020    Time 4    Period Weeks    Status New      PT SHORT TERM GOAL #3   Title Pt will be able to do a normal TUG in less than 14 seconds    Baseline 15.51 on 10/02/2020    Time 4    Period Weeks    Status New             PT Long Term Goals - 10/02/20 2038      PT LONG TERM  GOAL #1   Title Pt will report she is able to get in and out of bed with minimal assit from her husband .    Baseline pt needs mod assit to get in and out of bed on 10/02/2020    Time 8    Period Weeks    Status New      PT LONG TERM GOAL #2   Title Pt will be able to do 10 repetitions of sit to stand in 30 seconds    Baseline 5 reps on 10/02/2020    Time 8    Period Weeks    Status New      PT LONG TERM GOAL #3   Title Pt will increase gait speed to .8 meters per second demonstrating a decreased risk for fall compared to baseline    Baseline .65 meters per second on 10/01/2020    Time 8    Period Weeks    Status New      PT LONG TERM GOAL #4   Title Pt will have a home exercise program that she can follow through with on her own at home.    Time 8    Period Weeks    Status New                 Plan - 10/23/20 1437    Clinical Impression Statement Pt still with limited tolerance to activity but able to perform 5 reps of several exericses and bed mobility activities. Tried TG soft to see if it will help with left left swelling as that is still bothering her    Personal Factors and Comorbidities Comorbidity 3+    Comorbidities ovarian cancer, malignant ascites, past hip and shoulder replacement, falls    Examination-Activity Limitations Bed Mobility;Bathing;Dressing;Transfers;Locomotion Level;Reach Overhead    Examination-Participation Restrictions Cleaning;Laundry;Community Activity;Driving    Stability/Clinical Decision Making Evolving/Moderate complexity    Clinical Impairments Affecting Rehab Potential ongoing chemo    PT Frequency 2x / week    PT Duration 8  weeks    PT Treatment/Interventions ADLs/Self Care Home Management;Therapeutic exercise;Therapeutic activities;Functional mobility training;Stair training;Gait training;Patient/family education;Manual techniques;Energy conservation;Splinting;Passive range of motion;Neuromuscular re-education;Balance training;Manual lymph  drainage;Compression bandaging    PT Next Visit Plan how did tg soft work?  perhaps [place]???continue with  HEP progression, work on bed mobility, talk about energy conservation, time mangement in her day to include exercise and decrease sedentary behavior at home. progress to gait and balance training and more strengthening.    Consulted and Agree with Plan of Care Patient           Patient will benefit from skilled therapeutic intervention in order to improve the following deficits and impairments:  Decreased strength,Decreased mobility,Postural dysfunction,Improper body mechanics,Impaired flexibility,Decreased activity tolerance,Decreased endurance,Difficulty walking,Decreased range of motion,Abnormal gait,Decreased balance,Impaired sensation,Increased edema,Hypomobility,Decreased knowledge of use of DME,Decreased safety awareness,Impaired tone,Pain  Visit Diagnosis: Muscle weakness (generalized)  History of falling  Gait disturbance     Problem List Patient Active Problem List   Diagnosis Date Noted  . Nausea with vomiting 10/22/2020  . Malignant cachexia ([place]) 10/14/2020  . Mild depression ([place]) 10/14/2020  . Leg swelling 10/14/2020  . Anemia due to chronic illness 10/07/2020  . Mild protein malnutrition ([place]) 10/07/2020  . Physical debility 09/25/2020  . Lower back pain 09/18/2020  . Goals of care, counseling/discussion 09/18/2020  . Abdominal pain 09/09/2020  . Intractable nausea and vomiting 09/09/2020  . Elevated LFTs 09/09/2020  . Nasal congestion 09/02/2020  . Anorexia 09/02/2020  . Generalized weakness 09/02/2020  . Weight loss, abnormal 07/31/2020  . Ovarian ca ([place]) 07/30/2020  . Pelvic mass 07/26/2020  . Malignant neoplasm of other specified female genital organs ([place])  07/26/2020  . Chronic kidney disease, stage 3 unspecified ([place]) 07/24/2020  . Allergic rhinitis 07/24/2020  . Amnesia 07/24/2020  . Malignant ascites 07/24/2020  . Chronic sinusitis  07/24/2020  . Constipation 07/24/2020  . Eczema 07/24/2020  . Hearing loss 07/24/2020  . History of pancreatitis 07/24/2020  . Iron deficiency 07/24/2020  . Knee pain 07/24/2020  . Nocturia 07/24/2020  . Osteopenia 07/24/2020  . Polymyalgia rheumatica ([place]) 07/24/2020  . Pure hypercholesterolemia 07/24/2020  . Rosacea 07/24/2020  . Sacroiliitis, not elsewhere classified ([place]) 07/24/2020  . Sleep disturbance 07/24/2020  . Spontaneous ecchymosis 07/24/2020  . Vitamin D deficiency 07/24/2020  . Prolapsed internal hemorrhoids 11/04/2017  . Primary osteoarthritis of both shoulders 07/09/2016  . Status post replacement of right shoulder joint 07/09/2016  . Primary osteoarthritis of one hip, right 12/25/2014  . Dermatochalasis 07/22/2012  . Atrial fibrillation ([place]) 09/17/2011  . Hyperlipidemia 09/17/2011   Donato Heinz. Owens Shark PT  [place]o 10/23/2020, 2:40 PM  [place], [place], [address] Phone: [phone]   Fax:  [phone]  Name: GARNELL BEGEMAN MRN: [id number] Date of Birth: 30-Sep-1940

## 2020-10-25 ENCOUNTER — Ambulatory Visit: Payer: Medicare Other | Admitting: Physical Therapy

## 2020-10-28 ENCOUNTER — Inpatient Hospital Stay: Payer: Medicare Other

## 2020-10-28 ENCOUNTER — Other Ambulatory Visit: Payer: Self-pay

## 2020-10-28 ENCOUNTER — Inpatient Hospital Stay (HOSPITAL_BASED_OUTPATIENT_CLINIC_OR_DEPARTMENT_OTHER): Payer: Medicare Other | Admitting: Hematology and Oncology

## 2020-10-28 ENCOUNTER — Inpatient Hospital Stay: Payer: Medicare Other | Attending: Hematology and Oncology

## 2020-10-28 ENCOUNTER — Other Ambulatory Visit: Payer: Medicare Other

## 2020-10-28 ENCOUNTER — Encounter: Payer: Self-pay | Admitting: Hematology and Oncology

## 2020-10-28 DIAGNOSIS — C569 Malignant neoplasm of unspecified ovary: Secondary | ICD-10-CM

## 2020-10-28 DIAGNOSIS — Z5111 Encounter for antineoplastic chemotherapy: Secondary | ICD-10-CM | POA: Insufficient documentation

## 2020-10-28 DIAGNOSIS — E441 Mild protein-calorie malnutrition: Secondary | ICD-10-CM

## 2020-10-28 DIAGNOSIS — R630 Anorexia: Secondary | ICD-10-CM | POA: Diagnosis not present

## 2020-10-28 DIAGNOSIS — D638 Anemia in other chronic diseases classified elsewhere: Secondary | ICD-10-CM

## 2020-10-28 DIAGNOSIS — R180 Malignant ascites: Secondary | ICD-10-CM | POA: Diagnosis not present

## 2020-10-28 DIAGNOSIS — M7989 Other specified soft tissue disorders: Secondary | ICD-10-CM | POA: Diagnosis not present

## 2020-10-28 LAB — CBC WITH DIFFERENTIAL (CANCER CENTER ONLY)
Abs Immature Granulocytes: 0.03 10*3/uL (ref 0.00–0.07)
Basophils Absolute: 0 10*3/uL (ref 0.0–0.1)
Basophils Relative: 1 %
Eosinophils Absolute: 0.1 10*3/uL (ref 0.0–0.5)
Eosinophils Relative: 2 %
HCT: 30.8 % — ABNORMAL LOW (ref 36.0–46.0)
Hemoglobin: 10.5 g/dL — ABNORMAL LOW (ref 12.0–15.0)
Immature Granulocytes: 1 %
Lymphocytes Relative: 14 %
Lymphs Abs: 0.7 10*3/uL (ref 0.7–4.0)
MCH: 29.2 pg (ref 26.0–34.0)
MCHC: 34.1 g/dL (ref 30.0–36.0)
MCV: 85.6 fL (ref 80.0–100.0)
Monocytes Absolute: 0.6 10*3/uL (ref 0.1–1.0)
Monocytes Relative: 12 %
Neutro Abs: 3.6 10*3/uL (ref 1.7–7.7)
Neutrophils Relative %: 70 %
Platelet Count: 528 10*3/uL — ABNORMAL HIGH (ref 150–400)
RBC: 3.6 MIL/uL — ABNORMAL LOW (ref 3.87–5.11)
RDW: 15.9 % — ABNORMAL HIGH (ref 11.5–15.5)
WBC Count: 5.1 10*3/uL (ref 4.0–10.5)
nRBC: 0 % (ref 0.0–0.2)

## 2020-10-28 LAB — CMP (CANCER CENTER ONLY)
ALT: 10 U/L (ref 0–44)
AST: 13 U/L — ABNORMAL LOW (ref 15–41)
Albumin: 2.7 g/dL — ABNORMAL LOW (ref 3.5–5.0)
Alkaline Phosphatase: 100 U/L (ref 38–126)
Anion gap: 10 (ref 5–15)
BUN: 33 mg/dL — ABNORMAL HIGH (ref 8–23)
CO2: 26 mmol/L (ref 22–32)
Calcium: 8.5 mg/dL — ABNORMAL LOW (ref 8.9–10.3)
Chloride: 101 mmol/L (ref 98–111)
Creatinine: 0.73 mg/dL (ref 0.44–1.00)
GFR, Estimated: >60 mL/min (ref >60)
Glucose, Bld: 103 mg/dL — ABNORMAL HIGH (ref 70–99)
Potassium: 4 mmol/L (ref 3.5–5.1)
Sodium: 137 mmol/L (ref 135–145)
Total Bilirubin: <0.2 mg/dL — ABNORMAL LOW (ref 0.3–1.2)
Total Protein: 5.8 g/dL — ABNORMAL LOW (ref 6.5–8.1)

## 2020-10-28 MED ORDER — SODIUM CHLORIDE 0.9 % IV SOLN
800.0000 mg/m2 | Freq: Once | INTRAVENOUS | Status: AC
  Administered 2020-10-28: 1254 mg via INTRAVENOUS
  Filled 2020-10-28: qty 32.98

## 2020-10-28 MED ORDER — PROCHLORPERAZINE MALEATE 10 MG PO TABS
10.0000 mg | ORAL_TABLET | Freq: Once | ORAL | Status: AC
Start: 2020-10-28 — End: 2020-10-28
  Administered 2020-10-28: 10 mg via ORAL

## 2020-10-28 MED ORDER — PROCHLORPERAZINE MALEATE 10 MG PO TABS
ORAL_TABLET | ORAL | Status: AC
  Filled 2020-10-28: qty 1

## 2020-10-28 MED ORDER — SODIUM CHLORIDE 0.9 % IV SOLN
Freq: Once | INTRAVENOUS | Status: AC
  Filled 2020-10-28: qty 250

## 2020-10-28 NOTE — Assessment & Plan Note (Signed)
Her albumin status is low from multiple visits I continue to encourage the patient and her husband high-protein intake as tolerated 

## 2020-10-28 NOTE — Progress Notes (Signed)
[place] OFFICE PROGRESS NOTE  Patient Care Team: [name], MD as PCP - General (Family Medicine) [name], MD as Consulting Physician (Cardiology)  ASSESSMENT & PLAN:  Ovarian ca [facility]) Since the last time I saw her, she has gained back a little weight although I suspect part of the weight gain is due to fluid retention Clinically, she has detectable ascites, slightly more than last week and significant bilateral lower extremity edema The fluid retention is likely due to low albumin status and her disease We will proceed with treatment as scheduled I will see her again next week for further follow-up I reminded the patient and her husband we need minimum 6 doses of treatment before repeat CT imaging  Malignant ascites She has detectable ascites on exam but it does not bother her Observe closely She does not need paracentesis today  Anemia due to chronic illness She is not symptomatic Observe for now  Anorexia The patient and her husband start monitoring oral intake She will continue mirtazapine for now  Leg swelling Leg swelling is caused by severe protein calorie malnutrition and poor mobility Observe for now  Mild protein malnutrition ([place]) Her albumin status is low from multiple visits I continue to encourage the patient and her husband high-protein intake as tolerated   No orders of the defined types were placed in this encounter.   All questions were answered. The patient knows to call the clinic with any problems, questions or concerns. The total time spent in the appointment was 30 minutes encounter with patients including review of chart and various tests results, discussions about plan of care and coordination of care plan   [name], MD 10/28/2020 12:06 PM  INTERVAL HISTORY: Please see below for problem oriented charting. She returns with her husband for further follow-up Since last time I saw her, she felt fair She denies  nausea or vomiting No abdominal pain She has not been documenting her oral fluid intake When I inquired her husband about oral intake, he thinks she is eating reasonably well Denies recent constipation  SUMMARY OF ONCOLOGIC HISTORY: Oncology History  Ovarian ca ([place])  07/22/2020 Imaging   Ct abdomen and pelvis 1. Large calcified mass in the pelvis that is new from 2010. There is associated large volume ascites with mild peritoneal nodularity/calcification, primarily worrisome for a calcifying ovarian malignancy. 2. Small right pleural effusion with few calcifications in the anterior juxta diaphragmatic space, possibly related to #1. 3. Nonenlarged but calcified periaortic lymph nodes which are also worrisome for spread of disease.   07/29/2020 Procedure   Successful ultrasound-guided diagnostic and therapeutic paracentesis yielding 5 liters of peritoneal fluid.   07/29/2020 Tumor Marker   Patient's tumor was tested for the following markers: CA-125 Results of the tumor marker test revealed 771   07/30/2020 Initial Diagnosis   Ovarian ca ([place])   07/31/2020 Imaging   1. Calcified periesophageal and juxtadiaphragmatic nodules are indicative of metastatic disease. 2. Small right and trace left pleural effusions. 3. Ascites and calcified upper abdominal adenopathy/peritoneal nodules, better assessed on 07/22/2020. 4. Aortic atherosclerosis (ICD10-I70.0). Coronary artery calcification.   07/31/2020 Cancer Staging   Staging form: Ovary, Fallopian Tube, and Primary Peritoneal Carcinoma, AJCC 8th Edition - Clinical stage from 07/31/2020: Stage IV (cT3, cN1, cM1) - Signed by [name], MD on 07/31/2020 Stage prefix: Initial diagnosis   08/01/2020 Tumor Marker   Patient's tumor was tested for the following markers: CA-125 Results of the tumor marker test revealed  641   08/05/2020 - 08/26/2020 Chemotherapy         08/26/2020 Tumor Marker   Patient's tumor was tested for the following markers:  CA-125 Results of the tumor marker test revealed 663.   09/03/2020 Procedure   Successful ultrasound-guided therapeutic paracentesis yielding 5 liters of peritoneal fluid.   09/09/2020 - 09/14/2020 Hospital Admission   She was admitted to the hospital for management of subacute bowel obstruction   09/10/2020 Imaging   A total of approximately 2.9 L of clear yellow fluid was removed.   09/11/2020 Imaging   1. Small bowel obstruction with a transition zone seen within the posterolateral aspect of the left lower quadrant. 2. Large, stable calcified pelvic mass consistent with the patient's known history of ovarian cancer. 3. Mild-to-moderate amount of free fluid within the abdomen and pelvis. 4. Stable hepatic and left renal cysts. 5. Total right hip replacement. 6. Approximately 5 mm anterolisthesis of the L5 vertebral body on S1.   09/13/2020 Imaging   B/L venous Doppler US Right:  No evidence of deep vein thrombosis in the upper extremity. Findings consistent with acute superficial vein thrombosis involving the right forearm portion of the basilic vein.     Left:  No evidence of thrombosis in the subclavian.   10/07/2020 -  Chemotherapy    Patient is on Treatment Plan: OVARIAN GEMCITABINE D1,8 Q21D      10/07/2020 Tumor Marker   Patient's tumor was tested for the following markers: CA-125. Results of the tumor marker test revealed 544.   10/22/2020 Procedure   Successful ultrasound-guided therapeutic paracentesis yielding 5 liters of peritoneal fluid.     REVIEW OF SYSTEMS:   Constitutional: Denies fevers, chills or abnormal weight loss Eyes: Denies blurriness of vision Ears, nose, mouth, throat, and face: Denies mucositis or sore throat Respiratory: Denies cough, dyspnea or wheezes Cardiovascular: Denies palpitation, chest discomfort  Gastrointestinal:  Denies nausea, heartburn or change in bowel habits Skin: Denies abnormal skin rashes Lymphatics: Denies new lymphadenopathy  or easy bruising Neurological:Denies numbness, tingling or new weaknesses Behavioral/Psych: Mood is stable, no new changes  All other systems were reviewed with the patient and are negative.  I have reviewed the past medical history, past surgical history, social history and family history with the patient and they are unchanged from previous note.  ALLERGIES:  is allergic to cephalexin, hydrocodone-guaifenesin, tape, and tramadol hcl.  MEDICATIONS:  Current Outpatient Medications  Medication Sig Dispense Refill  . magnesium oxide (MAG-OX) 400 MG tablet Take 400 mg by mouth daily.    . mirtazapine (REMERON) 15 MG tablet Take 1 tablet (15 mg total) by mouth at bedtime. 30 tablet 2  . Multiple Vitamin (MULTI-VITAMINS) TABS Take 1 tablet by mouth daily.    . ondansetron (ZOFRAN) 8 MG tablet TAKE 1 TABLET BY MOUTH EVERY 8 HOURS AS NEEDED FOR REFRACTORY NAUSEA AND/OR VOMITING. START ON DAY 3 AFTER CARBOPLATIN CHEMO (Patient not taking: No sig reported) 30 tablet 1  . oxyCODONE (ROXICODONE) 5 MG immediate release tablet Take 1 tablet (5 mg total) by mouth every 4 (four) hours as needed for severe pain. 30 tablet 0  . polyethylene glycol (MIRALAX / GLYCOLAX) 17 g packet Take 17 g by mouth daily as needed for severe constipation. 14 each 0  . pravastatin (PRAVACHOL) 20 MG tablet Take 1 tablet (20 mg total) by mouth daily. (Patient not taking: No sig reported) 90 tablet 3  . prochlorperazine (COMPAZINE) 10 MG tablet Take 1 tablet (10 mg total)  by mouth every 6 (six) hours as needed (Nausea or vomiting). 5 tablet 0   No current facility-administered medications for this visit.    PHYSICAL EXAMINATION: ECOG PERFORMANCE STATUS: 2 - Symptomatic, <50% confined to bed  Vitals:   10/28/20 1143  BP: 115/65  Pulse: (!) 18  Resp: (!) 98  Temp: 97.8 F (36.6 C)  SpO2: 100%   Filed Weights   10/28/20 1143  Weight: 128 lb 3.2 oz (58.2 kg)    GENERAL:alert, no distress and comfortable SKIN: skin  color, texture, turgor are normal, no rashes or significant lesions EYES: normal, Conjunctiva are pink and non-injected, sclera clear OROPHARYNX:no exudate, no erythema and lips, buccal mucosa, and tongue normal  NECK: supple, thyroid normal size, non-tender, without nodularity LYMPH:  no palpable lymphadenopathy in the cervical, axillary or inguinal LUNGS: clear to auscultation and percussion with normal breathing effort HEART: regular rate & rhythm and no murmurs with moderate bilateral lower extremity edema ABDOMEN:abdomen soft, non-tender and normal bowel sounds.  Ascites are noted on exam Musculoskeletal:no cyanosis of digits and no clubbing  NEURO: alert & oriented x 3 with fluent speech, no focal motor/sensory deficits  LABORATORY DATA:  I have reviewed the data as listed    Component Value Date/Time   NA 140 10/14/2020 0932   NA 141 05/29/2019 1143   K 4.0 10/14/2020 0932   CL 103 10/14/2020 0932   CO2 25 10/14/2020 0932   GLUCOSE 131 (H) 10/14/2020 0932   BUN 17 10/14/2020 0932   BUN 29 (H) 05/29/2019 1143   CREATININE 0.73 10/14/2020 0932   CALCIUM 8.5 (L) 10/14/2020 0932   PROT 5.9 (L) 10/14/2020 0932   ALBUMIN 2.8 (L) 10/14/2020 0932   AST 14 (L) 10/14/2020 0932   ALT 16 10/14/2020 0932   ALKPHOS 150 (H) 10/14/2020 0932   BILITOT <0.2 (L) 10/14/2020 0932   GFRNONAA >60 10/14/2020 0932   GFRAA 74 05/29/2019 1143    No results found for: SPEP, UPEP  Lab Results  Component Value Date   WBC 5.1 10/28/2020   NEUTROABS 3.6 10/28/2020   HGB 10.5 (L) 10/28/2020   HCT 30.8 (L) 10/28/2020   MCV 85.6 10/28/2020   PLT 528 (H) 10/28/2020      Chemistry      Component Value Date/Time   NA 140 10/14/2020 0932   NA 141 05/29/2019 1143   K 4.0 10/14/2020 0932   CL 103 10/14/2020 0932   CO2 25 10/14/2020 0932   BUN 17 10/14/2020 0932   BUN 29 (H) 05/29/2019 1143   CREATININE 0.73 10/14/2020 0932      Component Value Date/Time   CALCIUM 8.5 (L) 10/14/2020 0932    ALKPHOS 150 (H) 10/14/2020 0932   AST 14 (L) 10/14/2020 0932   ALT 16 10/14/2020 0932   BILITOT <0.2 (L) 10/14/2020 0932       RADIOGRAPHIC STUDIES: I have personally reviewed the radiological images as listed and agreed with the findings in the report. US Paracentesis  Result Date: 10/22/2020 INDICATION: Patient with history of ovarian cancer with recurrent malignant ascites. Request received for therapeutic paracentesis up to 5 liters. EXAM: ULTRASOUND GUIDED THERAPEUTIC PARACENTESIS MEDICATIONS: 1% lidocaine to skin and subcutaneous tissue COMPLICATIONS: None immediate. PROCEDURE: Informed written consent was obtained from the patient after a discussion of the risks, benefits and alternatives to treatment. A timeout was performed prior to the initiation of the procedure. Initial ultrasound scanning demonstrates a large amount of ascites within the right mid to lower  abdominal quadrant. The right mid to lower abdomen was prepped and draped in the usual sterile fashion. 1% lidocaine was used for local anesthesia. Following this, a 19 gauge, 10-cm, Yueh catheter was introduced. An ultrasound image was saved for documentation purposes. The paracentesis was performed. The catheter was removed and a dressing was applied. The patient tolerated the procedure well without immediate post procedural complication. FINDINGS: A total of approximately 5 liters of yellow fluid was removed. IMPRESSION: Successful ultrasound-guided therapeutic paracentesis yielding 5 liters of peritoneal fluid. Read by: Rowe Robert, PA-C Electronically Signed   By: Markus Daft M.D.   On: 10/22/2020 14:30

## 2020-10-28 NOTE — Assessment & Plan Note (Signed)
She is not symptomatic. Observe for now 

## 2020-10-28 NOTE — Assessment & Plan Note (Signed)
Leg swelling is caused by severe protein calorie malnutrition and poor mobility Observe for now 

## 2020-10-28 NOTE — Assessment & Plan Note (Signed)
She has detectable ascites on exam but it does not bother her Observe closely She does not need paracentesis today

## 2020-10-28 NOTE — Assessment & Plan Note (Signed)
The patient and her husband start monitoring oral intake She will continue mirtazapine for now

## 2020-10-28 NOTE — Patient Instructions (Signed)
[facility] MEDICAL ONCOLOGY  Discharge Instructions: Thank you for choosing [facility] to provide your oncology and hematology care.   If you have a lab appointment with the Cancer Center, please go directly to the Cancer Center and check in at the registration area.   Wear comfortable clothing and clothing appropriate for easy access to any Portacath or PICC line.   We strive to give you quality time with your provider. You may need to reschedule your appointment if you arrive late (15 or more minutes).  Arriving late affects you and other patients whose appointments are after yours.  Also, if you miss three or more appointments without notifying the office, you may be dismissed from the clinic at the provider's discretion.      For prescription refill requests, have your pharmacy contact our office and allow 72 hours for refills to be completed.    Today you received the following chemotherapy and/or immunotherapy agents gemzar      To help prevent nausea and vomiting after your treatment, we encourage you to take your nausea medication as directed.  BELOW ARE SYMPTOMS THAT SHOULD BE REPORTED IMMEDIATELY: *FEVER GREATER THAN 100.4 F (38 C) OR HIGHER *CHILLS OR SWEATING *NAUSEA AND VOMITING THAT IS NOT CONTROLLED WITH YOUR NAUSEA MEDICATION *UNUSUAL SHORTNESS OF BREATH *UNUSUAL BRUISING OR BLEEDING *URINARY PROBLEMS (pain or burning when urinating, or frequent urination) *BOWEL PROBLEMS (unusual diarrhea, constipation, pain near the anus) TENDERNESS IN MOUTH AND THROAT WITH OR WITHOUT PRESENCE OF ULCERS (sore throat, sores in mouth, or a toothache) UNUSUAL RASH, SWELLING OR PAIN  UNUSUAL VAGINAL DISCHARGE OR ITCHING   Items with * indicate a potential emergency and should be followed up as soon as possible or go to the Emergency Department if any problems should occur.  Please show the CHEMOTHERAPY ALERT CARD or IMMUNOTHERAPY ALERT CARD at check-in to the  Emergency Department and triage nurse.  Should you have questions after your visit or need to cancel or reschedule your appointment, please contact [facility]  Dept: [phone]  and follow the prompts.  Office hours are 8:00 a.m. to 4:30 p.m. Monday - Friday. Please note that voicemails left after 4:00 p.m. may not be returned until the following business day.  We are closed weekends and major holidays. You have access to a nurse at all times for urgent questions. Please call the main number to the clinic Dept: [phone] and follow the prompts.   For any non-urgent questions, you may also contact your provider using MyChart. We now offer e-Visits for anyone 18 and older to request care online for non-urgent symptoms. For details visit [email or web address].   Also download the MyChart app! Go to the app store, search "MyChart", open the app, select , and log in with your MyChart username and password.  Due to Covid, a mask is required upon entering the hospital/clinic. If you do not have a mask, one will be given to you upon arrival. For doctor visits, patients may have 1 support person aged 18 or older with them. For treatment visits, patients cannot have anyone with them due to current Covid guidelines and our immunocompromised population.   

## 2020-10-28 NOTE — Assessment & Plan Note (Signed)
Since the last time I saw her, she has gained back a little weight although I suspect part of the weight gain is due to fluid retention Clinically, she has detectable ascites, slightly more than last week and significant bilateral lower extremity edema The fluid retention is likely due to low albumin status and her disease We will proceed with treatment as scheduled I will see her again next week for further follow-up I reminded the patient and her husband we need minimum 6 doses of treatment before repeat CT imaging

## 2020-10-31 ENCOUNTER — Telehealth: Payer: Self-pay

## 2020-10-31 NOTE — Telephone Encounter (Signed)
Returned pt's husband's call as requested. Discussed with pt's husband latest albumin and total protein results from recent lab work as requested. RN encouraged continued oral intake of high protein. Pt's husband verbalizes understanding

## 2020-11-01 DIAGNOSIS — C563 Malignant neoplasm of bilateral ovaries: Secondary | ICD-10-CM | POA: Diagnosis not present

## 2020-11-01 DIAGNOSIS — Z7189 Other specified counseling: Secondary | ICD-10-CM | POA: Diagnosis not present

## 2020-11-01 DIAGNOSIS — R180 Malignant ascites: Secondary | ICD-10-CM | POA: Diagnosis not present

## 2020-11-04 ENCOUNTER — Inpatient Hospital Stay (HOSPITAL_BASED_OUTPATIENT_CLINIC_OR_DEPARTMENT_OTHER): Payer: Medicare Other | Admitting: Hematology and Oncology

## 2020-11-04 ENCOUNTER — Inpatient Hospital Stay: Payer: Medicare Other

## 2020-11-04 ENCOUNTER — Encounter: Payer: Self-pay | Admitting: Oncology

## 2020-11-04 ENCOUNTER — Inpatient Hospital Stay: Payer: Medicare Other | Admitting: Nutrition

## 2020-11-04 ENCOUNTER — Other Ambulatory Visit: Payer: Self-pay

## 2020-11-04 ENCOUNTER — Other Ambulatory Visit: Payer: Medicare Other

## 2020-11-04 ENCOUNTER — Encounter: Payer: Self-pay | Admitting: Hematology and Oncology

## 2020-11-04 DIAGNOSIS — D61818 Other pancytopenia: Secondary | ICD-10-CM | POA: Diagnosis not present

## 2020-11-04 DIAGNOSIS — C569 Malignant neoplasm of unspecified ovary: Secondary | ICD-10-CM

## 2020-11-04 DIAGNOSIS — R180 Malignant ascites: Secondary | ICD-10-CM

## 2020-11-04 DIAGNOSIS — R634 Abnormal weight loss: Secondary | ICD-10-CM

## 2020-11-04 DIAGNOSIS — Z5111 Encounter for antineoplastic chemotherapy: Secondary | ICD-10-CM | POA: Diagnosis not present

## 2020-11-04 LAB — CBC WITH DIFFERENTIAL (CANCER CENTER ONLY)
Abs Immature Granulocytes: 0.02 10*3/uL (ref 0.00–0.07)
Basophils Absolute: 0 10*3/uL (ref 0.0–0.1)
Basophils Relative: 1 %
Eosinophils Absolute: 0 10*3/uL (ref 0.0–0.5)
Eosinophils Relative: 1 %
HCT: 28.5 % — ABNORMAL LOW (ref 36.0–46.0)
Hemoglobin: 9.7 g/dL — ABNORMAL LOW (ref 12.0–15.0)
Immature Granulocytes: 1 %
Lymphocytes Relative: 24 %
Lymphs Abs: 0.7 10*3/uL (ref 0.7–4.0)
MCH: 29.4 pg (ref 26.0–34.0)
MCHC: 34 g/dL (ref 30.0–36.0)
MCV: 86.4 fL (ref 80.0–100.0)
Monocytes Absolute: 0.4 10*3/uL (ref 0.1–1.0)
Monocytes Relative: 15 %
Neutro Abs: 1.8 10*3/uL (ref 1.7–7.7)
Neutrophils Relative %: 58 %
Platelet Count: 295 10*3/uL (ref 150–400)
RBC: 3.3 MIL/uL — ABNORMAL LOW (ref 3.87–5.11)
RDW: 15.4 % (ref 11.5–15.5)
WBC Count: 3 10*3/uL — ABNORMAL LOW (ref 4.0–10.5)
nRBC: 0 % (ref 0.0–0.2)

## 2020-11-04 LAB — CMP (CANCER CENTER ONLY)
ALT: 14 U/L (ref 0–44)
AST: 14 U/L — ABNORMAL LOW (ref 15–41)
Albumin: 2.5 g/dL — ABNORMAL LOW (ref 3.5–5.0)
Alkaline Phosphatase: 88 U/L (ref 38–126)
Anion gap: 11 (ref 5–15)
BUN: 25 mg/dL — ABNORMAL HIGH (ref 8–23)
CO2: 23 mmol/L (ref 22–32)
Calcium: 8.4 mg/dL — ABNORMAL LOW (ref 8.9–10.3)
Chloride: 102 mmol/L (ref 98–111)
Creatinine: 0.75 mg/dL (ref 0.44–1.00)
GFR, Estimated: >60 mL/min (ref >60)
Glucose, Bld: 142 mg/dL — ABNORMAL HIGH (ref 70–99)
Potassium: 3.9 mmol/L (ref 3.5–5.1)
Sodium: 136 mmol/L (ref 135–145)
Total Bilirubin: <0.2 mg/dL — ABNORMAL LOW (ref 0.3–1.2)
Total Protein: 5.6 g/dL — ABNORMAL LOW (ref 6.5–8.1)

## 2020-11-04 MED ORDER — PROCHLORPERAZINE MALEATE 10 MG PO TABS
10.0000 mg | ORAL_TABLET | Freq: Once | ORAL | Status: AC
  Administered 2020-11-04: 10 mg via ORAL

## 2020-11-04 MED ORDER — SODIUM CHLORIDE 0.9 % IV SOLN
Freq: Once | INTRAVENOUS | Status: AC
  Filled 2020-11-04: qty 250

## 2020-11-04 MED ORDER — SODIUM CHLORIDE 0.9 % IV SOLN
800.0000 mg/m2 | Freq: Once | INTRAVENOUS | Status: AC
  Administered 2020-11-04: 1254 mg via INTRAVENOUS
  Filled 2020-11-04: qty 32.98

## 2020-11-04 MED ORDER — PROCHLORPERAZINE MALEATE 10 MG PO TABS
ORAL_TABLET | ORAL | Status: AC
  Filled 2020-11-04: qty 1

## 2020-11-04 NOTE — Progress Notes (Signed)
[facility] OFFICE PROGRESS NOTE  Patient Care Team: [name], MD as PCP - General (Family Medicine) [name], MD as Consulting Physician (Cardiology)  ASSESSMENT & PLAN:  Ovarian ca [facility]) I have reviewed her case with the physician from [place] The patient and her husband have difficulties excepting recommendation She question the ability of the [place] physician repeatedly that they were able to gather all the information and gave her the right recommendation She found it hard to believe when she was recommended palliative care/hospice She found that nobody is giving her hope I told the patient and her husband realistically, she does not appear to be responding well to treatment and it is an acceptable route for patients not to pursue further chemotherapy and focus on palliative care with hospice Unfortunately, this is not an acceptable route for her We will pursue further chemotherapy as previously discussed I will refer her to see genetic counselor for genetic testing  Pancytopenia, acquired [facility]) She has progressive pancytopenia due to treatment She felt fine We will proceed with treatment without delay  Malignant ascites She has progressive malignant ascites but it does not bother her today I do not recommend we proceed with paracentesis  Weight loss, abnormal While she has not lost weight due to accumulation of ascites and fluid retention around her ankles, she is getting more profound protein calorie malnutrition She did not find mirtazapine helpful and stopped it herself I recommend consideration to increase the dose but she is not interested to continue She is scheduled to see dietitian today for further evaluation and management   No orders of the defined types were placed in this encounter.   All questions were answered. The patient knows to call the clinic with any problems, questions or concerns. The total time spent in the appointment was 30  minutes encounter with patients including review of chart and various tests results, discussions about plan of care and coordination of care plan   [name], MD 11/04/2020 3:01 PM  INTERVAL HISTORY: Please see below for problem oriented charting. She returns with her husband for further follow-up She had second opinion at [place] last week and I reviewed the plan of care with the physician then She denies abdominal pain She denies shortness of breath or discomfort with recurrent ascites Her energy level is fair The patient continues to ruminate over her previous experience while she was hospitalized and her recent experience at [place]: Oncology History  Ovarian ca ([place])  07/22/2020 Imaging   Ct abdomen and pelvis 1. Large calcified mass in the pelvis that is new from 2010. There is associated large volume ascites with mild peritoneal nodularity/calcification, primarily worrisome for a calcifying ovarian malignancy. 2. Small right pleural effusion with few calcifications in the anterior juxta diaphragmatic space, possibly related to #1. 3. Nonenlarged but calcified periaortic lymph nodes which are also worrisome for spread of disease.   07/29/2020 Procedure   Successful ultrasound-guided diagnostic and therapeutic paracentesis yielding 5 liters of peritoneal fluid.   07/29/2020 Tumor Marker   Patient's tumor was tested for the following markers: CA-125 Results of the tumor marker test revealed 771   07/30/2020 Initial Diagnosis   Ovarian ca ([place])   07/31/2020 Imaging   1. Calcified periesophageal and juxtadiaphragmatic nodules are indicative of metastatic disease. 2. Small right and trace left pleural effusions. 3. Ascites and calcified upper abdominal adenopathy/peritoneal nodules, better assessed on 07/22/2020. 4. Aortic atherosclerosis (ICD10-I70.0). Coronary artery calcification.  07/31/2020 Cancer Staging   Staging form: Ovary, Fallopian Tube, and Primary  Peritoneal Carcinoma, AJCC 8th Edition - Clinical stage from 07/31/2020: Stage IV (cT3, cN1, cM1) - Signed by [name], MD on 07/31/2020 Stage prefix: Initial diagnosis   08/01/2020 Tumor Marker   Patient's tumor was tested for the following markers: CA-125 Results of the tumor marker test revealed 641   08/05/2020 - 08/26/2020 Chemotherapy         08/26/2020 Tumor Marker   Patient's tumor was tested for the following markers: CA-125 Results of the tumor marker test revealed 663.   09/03/2020 Procedure   Successful ultrasound-guided therapeutic paracentesis yielding 5 liters of peritoneal fluid.   09/09/2020 - 09/14/2020 Hospital Admission   She was admitted to the hospital for management of subacute bowel obstruction   09/10/2020 Imaging   A total of approximately 2.9 L of clear yellow fluid was removed.   09/11/2020 Imaging   1. Small bowel obstruction with a transition zone seen within the posterolateral aspect of the left lower quadrant. 2. Large, stable calcified pelvic mass consistent with the patient's known history of ovarian cancer. 3. Mild-to-moderate amount of free fluid within the abdomen and pelvis. 4. Stable hepatic and left renal cysts. 5. Total right hip replacement. 6. Approximately 5 mm anterolisthesis of the L5 vertebral body on S1.   09/13/2020 Imaging   B/L venous Doppler US Right:  No evidence of deep vein thrombosis in the upper extremity. Findings consistent with acute superficial vein thrombosis involving the right forearm portion of the basilic vein.     Left:  No evidence of thrombosis in the subclavian.   10/07/2020 -  Chemotherapy    Patient is on Treatment Plan: OVARIAN GEMCITABINE D1,8 Q21D      10/07/2020 Tumor Marker   Patient's tumor was tested for the following markers: CA-125. Results of the tumor marker test revealed 544.   10/22/2020 Procedure   Successful ultrasound-guided therapeutic paracentesis yielding 5 liters of peritoneal fluid.      REVIEW OF SYSTEMS:   Constitutional: Denies fevers, chills or abnormal weight loss Eyes: Denies blurriness of vision Ears, nose, mouth, throat, and face: Denies mucositis or sore throat Respiratory: Denies cough, dyspnea or wheezes Cardiovascular: Denies palpitation, chest discomfort or lower extremity swelling Gastrointestinal:  Denies nausea, heartburn or change in bowel habits Skin: Denies abnormal skin rashes Lymphatics: Denies new lymphadenopathy or easy bruising Neurological:Denies numbness, tingling or new weaknesses Behavioral/Psych: Mood is stable, no new changes  All other systems were reviewed with the patient and are negative.  I have reviewed the past medical history, past surgical history, social history and family history with the patient and they are unchanged from previous note.  ALLERGIES:  is allergic to cephalexin, hydrocodone-guaifenesin, tape, and tramadol hcl.  MEDICATIONS:  Current Outpatient Medications  Medication Sig Dispense Refill  . magnesium oxide (MAG-OX) 400 MG tablet Take 400 mg by mouth daily.    . Multiple Vitamin (MULTI-VITAMINS) TABS Take 1 tablet by mouth daily.    . ondansetron (ZOFRAN) 8 MG tablet TAKE 1 TABLET BY MOUTH EVERY 8 HOURS AS NEEDED FOR REFRACTORY NAUSEA AND/OR VOMITING. START ON DAY 3 AFTER CARBOPLATIN CHEMO (Patient not taking: No sig reported) 30 tablet 1  . oxyCODONE (ROXICODONE) 5 MG immediate release tablet Take 1 tablet (5 mg total) by mouth every 4 (four) hours as needed for severe pain. 30 tablet 0  . polyethylene glycol (MIRALAX / GLYCOLAX) 17 g packet Take 17 g by mouth daily as  needed for severe constipation. 14 each 0  . pravastatin (PRAVACHOL) 20 MG tablet Take 1 tablet (20 mg total) by mouth daily. (Patient not taking: No sig reported) 90 tablet 3  . prochlorperazine (COMPAZINE) 10 MG tablet Take 1 tablet (10 mg total) by mouth every 6 (six) hours as needed (Nausea or vomiting). 5 tablet 0   No current  facility-administered medications for this visit.    PHYSICAL EXAMINATION: ECOG PERFORMANCE STATUS: 2 - Symptomatic, <50% confined to bed  Vitals:   11/04/20 1112  BP: 125/73  Pulse: 71  Resp: 18  Temp: (!) 97.5 F (36.4 C)  SpO2: 100%   Filed Weights   11/04/20 1112  Weight: 130 lb 3.2 oz (59.1 kg)    GENERAL:alert, no distress and comfortable SKIN: skin color, texture, turgor are normal, no rashes or significant lesions EYES: normal, Conjunctiva are pink and non-injected, sclera clear OROPHARYNX:no exudate, no erythema and lips, buccal mucosa, and tongue normal  NECK: supple, thyroid normal size, non-tender, without nodularity LYMPH:  no palpable lymphadenopathy in the cervical, axillary or inguinal LUNGS: clear to auscultation and percussion with normal breathing effort HEART: regular rate & rhythm and no murmurs with moderate bilateral lower extremity edema ABDOMEN:abdomen soft, noted reaccumulation of ascites compared to previous visit Musculoskeletal:no cyanosis of digits and no clubbing  NEURO: alert & oriented x 3 with fluent speech, no focal motor/sensory deficits  LABORATORY DATA:  I have reviewed the data as listed    Component Value Date/Time   NA 136 11/04/2020 1038   NA 141 05/29/2019 1143   K 3.9 11/04/2020 1038   CL 102 11/04/2020 1038   CO2 23 11/04/2020 1038   GLUCOSE 142 (H) 11/04/2020 1038   BUN 25 (H) 11/04/2020 1038   BUN 29 (H) 05/29/2019 1143   CREATININE 0.75 11/04/2020 1038   CALCIUM 8.4 (L) 11/04/2020 1038   PROT 5.6 (L) 11/04/2020 1038   ALBUMIN 2.5 (L) 11/04/2020 1038   AST 14 (L) 11/04/2020 1038   ALT 14 11/04/2020 1038   ALKPHOS 88 11/04/2020 1038   BILITOT <0.2 (L) 11/04/2020 1038   GFRNONAA >60 11/04/2020 1038   GFRAA 74 05/29/2019 1143    No results found for: SPEP, UPEP  Lab Results  Component Value Date   WBC 3.0 (L) 11/04/2020   NEUTROABS 1.8 11/04/2020   HGB 9.7 (L) 11/04/2020   HCT 28.5 (L) 11/04/2020   MCV 86.4  11/04/2020   PLT 295 11/04/2020      Chemistry      Component Value Date/Time   NA 136 11/04/2020 1038   NA 141 05/29/2019 1143   K 3.9 11/04/2020 1038   CL 102 11/04/2020 1038   CO2 23 11/04/2020 1038   BUN 25 (H) 11/04/2020 1038   BUN 29 (H) 05/29/2019 1143   CREATININE 0.75 11/04/2020 1038      Component Value Date/Time   CALCIUM 8.4 (L) 11/04/2020 1038   ALKPHOS 88 11/04/2020 1038   AST 14 (L) 11/04/2020 1038   ALT 14 11/04/2020 1038   BILITOT <0.2 (L) 11/04/2020 1038

## 2020-11-04 NOTE — Patient Instructions (Signed)
[facility]ICAL [facility]COL[facility]  Discharge Instructions: Thank you for choosing [facility] to provide your oncology and hematology care.   If you have a lab appointment with the Cancer Center, please go directly to the Cancer Center and check in at the registration area.   Wear comfortable clothing and clothing appropriate for easy access to any Portacath or PICC line.   We strive to give you quality time with your provider. You may need to reschedule your appointment if you arrive late (15 or more minutes).  Arriving late affects you and other patients whose appointments are after yours.  Also, if you miss three or more appointments without notifying the office, you may be dismissed from the clinic at the provider's discretion.      For prescription refill requests, have your pharmacy contact our office and allow 72 hours for refills to be completed.    Today you received the following chemotherapy and/or immunotherapy agents Gemzar      To help prevent nausea and vomiting after your treatment, we encourage you to take your nausea medication as directed.  BELOW ARE SYMPTOMS THAT SHOULD BE REPORTED IMMEDIATELY: *FEVER GREATER THAN 100.4 F (38 C) OR HIGHER *CHILLS OR SWEATING *NAUSEA AND VOMITING THAT IS NOT CONTROLLED WITH YOUR NAUSEA MEDICATION *UNUSUAL SHORTNESS OF BREATH *UNUSUAL BRUISING OR BLEEDING *URINARY PROBLEMS (pain or burning when urinating, or frequent urination) *BOWEL PROBLEMS (unusual diarrhea, constipation, pain near the anus) TENDERNESS IN MOUTH AND THROAT WITH OR WITHOUT PRESENCE OF ULCERS (sore throat, sores in mouth, or a toothache) UNUSUAL RASH, SWELLING OR PAIN  UNUSUAL VAGINAL DISCHARGE OR ITCHING   Items with * indicate a potential emergency and should be followed up as soon as possible or go to the Emergency Department if any problems should occur.  Please show the CHEMOTHERAPY ALERT CARD or IMMUNOTHERAPY ALERT CARD at check-in to the  Emergency Department and triage nurse.  Should you have questions after your visit or need to cancel or reschedule your appointment, please contact [facility]  Dept: [phone]  and follow the prompts.  Office hours are 8:00 a.m. to 4:30 p.m. Monday - Friday. Please note that voicemails left after 4:00 p.m. may not be returned until the following business day.  We are closed weekends and major holidays. You have access to a nurse at all times for urgent questions. Please call the main number to the clinic Dept: [phone] and follow the prompts.   For any non-urgent questions, you may also contact your provider using MyChart. We now offer e-Visits for anyone 18 and older to request care online for non-urgent symptoms. For details visit [email or web address].   Also download the MyChart app! Go to the app store, search "MyChart", open the app, select [facility], and log in with your MyChart username and password.  Due to Covid, a mask is required upon entering the hospital/clinic. If you do not have a mask, one will be given to you upon arrival. For doctor visits, patients may have 1 support person aged 18 or older with them. For treatment visits, patients cannot have anyone with them due to current Covid guidelines and our immunocompromised population.   

## 2020-11-04 NOTE — Assessment & Plan Note (Signed)
She has progressive malignant ascites but it does not bother her today I do not recommend we proceed with paracentesis

## 2020-11-04 NOTE — Assessment & Plan Note (Signed)
I have reviewed her case with the physician from [place] The patient and her husband have difficulties excepting recommendation She question the ability of the [facility] physician repeatedly that they were able to gather all the information and gave her the right recommendation She found it hard to believe when she was recommended palliative care/hospice She found that nobody is giving her hope I told the patient and her husband realistically, she does not appear to be responding well to treatment and it is an acceptable route for patients not to pursue further chemotherapy and focus on palliative care with hospice Unfortunately, this is not an acceptable route for her We will pursue further chemotherapy as previously discussed I will refer her to see genetic counselor for genetic testing

## 2020-11-04 NOTE — Assessment & Plan Note (Signed)
She has progressive pancytopenia due to treatment She felt fine We will proceed with treatment without delay

## 2020-11-04 NOTE — Progress Notes (Signed)
Notified [name] of genetic counseling appointment on 11/18/20 at 11:00.  Provided her with a new calender of appointments.

## 2020-11-04 NOTE — Progress Notes (Signed)
Nutrition follow-up completed with patient during infusion for ovarian cancer. Patient reports she isn't really doing well. She reports a poor appetite with early satiety. Weight increased to 130.2 pounds (?)  Abdominal ascites. She lives with her husband but would like suggestions on easily prepared protein foods.  Estimated Nutrition Needs: 1800-2000 kcal, 90-100 gr protein, 2.0 L fluid  Nutrition Diagnosis: Unintended weight loss difficult to assess secondary to ascites.  Intervention: Educated patient to increase calories and protein in small frequent meals.  Provided snack ideas and easy to purchase/prepare foods. Provided nutrition fact sheets and reminded patient to try nutrition supplements. Encouraged bowel regimen.  Monitoring, evaluation, goals: Patient will work to increase calories and protein to minimize loss of lean body mass.  Next visit: Thursday, June 2 during infusion.  **Disclaimer: This note was dictated with voice recognition software. Similar sounding words can inadvertently be transcribed and this note may contain transcription errors which may not have been corrected upon publication of note.**

## 2020-11-04 NOTE — Assessment & Plan Note (Signed)
While she has not lost weight due to accumulation of ascites and fluid retention around her ankles, she is getting more profound protein calorie malnutrition She did not find mirtazapine helpful and stopped it herself I recommend consideration to increase the dose but she is not interested to continue She is scheduled to see dietitian today for further evaluation and management

## 2020-11-05 LAB — CA 125: Cancer Antigen (CA) 125: 306 U/mL — ABNORMAL HIGH (ref 0.0–38.1)

## 2020-11-12 DIAGNOSIS — Z23 Encounter for immunization: Secondary | ICD-10-CM | POA: Diagnosis not present

## 2020-11-13 ENCOUNTER — Telehealth: Payer: Self-pay

## 2020-11-13 NOTE — Telephone Encounter (Signed)
Pt called stating she has a mix between constipation and loose stools. Pt states when she takes Miralax she has loose stools and can not make it to the bathroom, but if she does not take it she is constipated. This LPN recommended to pt she takes a stool softener and plenty of fluids. Pt verbalized thanks and understanding.

## 2020-11-14 ENCOUNTER — Telehealth: Payer: Self-pay

## 2020-11-14 ENCOUNTER — Other Ambulatory Visit (HOSPITAL_COMMUNITY): Payer: Self-pay

## 2020-11-14 ENCOUNTER — Other Ambulatory Visit: Payer: Self-pay

## 2020-11-14 ENCOUNTER — Other Ambulatory Visit: Payer: Self-pay | Admitting: Licensed Clinical Social Worker

## 2020-11-14 DIAGNOSIS — C569 Malignant neoplasm of unspecified ovary: Secondary | ICD-10-CM

## 2020-11-14 MED ORDER — ONDANSETRON HCL 8 MG PO TABS
ORAL_TABLET | ORAL | 1 refills | Status: DC
  Filled 2020-11-14: qty 30, 10d supply, fill #0

## 2020-11-14 NOTE — Telephone Encounter (Signed)
Returned call to husband. Misplaced Zofran Rx and needing a refill. Rx sent to [facility] outpatient pharmacy.

## 2020-11-18 ENCOUNTER — Other Ambulatory Visit: Payer: Self-pay

## 2020-11-18 ENCOUNTER — Inpatient Hospital Stay: Payer: Medicare Other

## 2020-11-18 ENCOUNTER — Inpatient Hospital Stay (HOSPITAL_BASED_OUTPATIENT_CLINIC_OR_DEPARTMENT_OTHER): Payer: Medicare Other | Admitting: Hematology and Oncology

## 2020-11-18 ENCOUNTER — Encounter: Payer: Self-pay | Admitting: Licensed Clinical Social Worker

## 2020-11-18 ENCOUNTER — Inpatient Hospital Stay (HOSPITAL_BASED_OUTPATIENT_CLINIC_OR_DEPARTMENT_OTHER): Payer: Medicare Other | Admitting: Licensed Clinical Social Worker

## 2020-11-18 VITALS — BP 128/71 | HR 77 | Temp 97.6°F | Resp 18 | Ht 61.0 in | Wt 132.8 lb

## 2020-11-18 DIAGNOSIS — C569 Malignant neoplasm of unspecified ovary: Secondary | ICD-10-CM

## 2020-11-18 DIAGNOSIS — G4700 Insomnia, unspecified: Secondary | ICD-10-CM

## 2020-11-18 DIAGNOSIS — D638 Anemia in other chronic diseases classified elsewhere: Secondary | ICD-10-CM

## 2020-11-18 DIAGNOSIS — E441 Mild protein-calorie malnutrition: Secondary | ICD-10-CM

## 2020-11-18 DIAGNOSIS — Z807 Family history of other malignant neoplasms of lymphoid, hematopoietic and related tissues: Secondary | ICD-10-CM | POA: Insufficient documentation

## 2020-11-18 DIAGNOSIS — R630 Anorexia: Secondary | ICD-10-CM

## 2020-11-18 DIAGNOSIS — K5909 Other constipation: Secondary | ICD-10-CM

## 2020-11-18 DIAGNOSIS — R180 Malignant ascites: Secondary | ICD-10-CM

## 2020-11-18 DIAGNOSIS — Z5111 Encounter for antineoplastic chemotherapy: Secondary | ICD-10-CM | POA: Diagnosis not present

## 2020-11-18 LAB — CBC WITH DIFFERENTIAL (CANCER CENTER ONLY)
Abs Immature Granulocytes: 0.02 10*3/uL (ref 0.00–0.07)
Basophils Absolute: 0 10*3/uL (ref 0.0–0.1)
Basophils Relative: 1 %
Eosinophils Absolute: 0.1 10*3/uL (ref 0.0–0.5)
Eosinophils Relative: 1 %
HCT: 31.3 % — ABNORMAL LOW (ref 36.0–46.0)
Hemoglobin: 10.4 g/dL — ABNORMAL LOW (ref 12.0–15.0)
Immature Granulocytes: 0 %
Lymphocytes Relative: 15 %
Lymphs Abs: 0.8 10*3/uL (ref 0.7–4.0)
MCH: 29.1 pg (ref 26.0–34.0)
MCHC: 33.2 g/dL (ref 30.0–36.0)
MCV: 87.7 fL (ref 80.0–100.0)
Monocytes Absolute: 0.5 10*3/uL (ref 0.1–1.0)
Monocytes Relative: 9 %
Neutro Abs: 4 10*3/uL (ref 1.7–7.7)
Neutrophils Relative %: 74 %
Platelet Count: 420 10*3/uL — ABNORMAL HIGH (ref 150–400)
RBC: 3.57 MIL/uL — ABNORMAL LOW (ref 3.87–5.11)
RDW: 15.9 % — ABNORMAL HIGH (ref 11.5–15.5)
WBC Count: 5.5 10*3/uL (ref 4.0–10.5)
nRBC: 0 % (ref 0.0–0.2)

## 2020-11-18 LAB — CMP (CANCER CENTER ONLY)
ALT: 7 U/L (ref 0–44)
AST: 11 U/L — ABNORMAL LOW (ref 15–41)
Albumin: 2.5 g/dL — ABNORMAL LOW (ref 3.5–5.0)
Alkaline Phosphatase: 77 U/L (ref 38–126)
Anion gap: 8 (ref 5–15)
BUN: 27 mg/dL — ABNORMAL HIGH (ref 8–23)
CO2: 25 mmol/L (ref 22–32)
Calcium: 8.7 mg/dL — ABNORMAL LOW (ref 8.9–10.3)
Chloride: 103 mmol/L (ref 98–111)
Creatinine: 0.8 mg/dL (ref 0.44–1.00)
GFR, Estimated: >60 mL/min (ref >60)
Glucose, Bld: 159 mg/dL — ABNORMAL HIGH (ref 70–99)
Potassium: 3.8 mmol/L (ref 3.5–5.1)
Sodium: 136 mmol/L (ref 135–145)
Total Bilirubin: 0.2 mg/dL — ABNORMAL LOW (ref 0.3–1.2)
Total Protein: 5.6 g/dL — ABNORMAL LOW (ref 6.5–8.1)

## 2020-11-18 LAB — GENETIC SCREENING ORDER

## 2020-11-18 MED ORDER — SODIUM CHLORIDE 0.9 % IV SOLN
800.0000 mg/m2 | Freq: Once | INTRAVENOUS | Status: AC
  Administered 2020-11-18: 1254 mg via INTRAVENOUS
  Filled 2020-11-18: qty 32.98

## 2020-11-18 MED ORDER — SODIUM CHLORIDE 0.9 % IV SOLN
Freq: Once | INTRAVENOUS | Status: AC
  Filled 2020-11-18: qty 250

## 2020-11-18 MED ORDER — PROCHLORPERAZINE MALEATE 10 MG PO TABS
10.0000 mg | ORAL_TABLET | Freq: Once | ORAL | Status: AC
  Administered 2020-11-18: 10 mg via ORAL

## 2020-11-18 MED ORDER — PROCHLORPERAZINE MALEATE 10 MG PO TABS
ORAL_TABLET | ORAL | Status: AC
  Filled 2020-11-18: qty 1

## 2020-11-18 MED ORDER — SODIUM CHLORIDE 0.9% FLUSH
10.0000 mL | INTRAVENOUS | Status: DC | PRN
  Filled 2020-11-18: qty 10

## 2020-11-18 NOTE — Progress Notes (Addendum)
REFERRING PROVIDER: [name], MD [address] [place],  [place] [address]  PRIMARY PROVIDER:  [name], MD  PRIMARY REASON FOR VISIT:  1. Malignant neoplasm of ovary, unspecified laterality ([place])   2. Family history of Hodgkin's lymphoma      HISTORY OF PRESENT ILLNESS:   [name], a 80 y.o. female, was seen for a [place] cancer genetics consultation at the request of Dr. Alvy Bimler due to a personal and family history of cancer. [name] presents to clinic today to discuss the possibility of a hereditary predisposition to cancer, genetic testing, and to further clarify her future cancer risks, as well as potential cancer risks for family members.   In 2022, at the age of 31, [name] was diagnosed with stage IV ovarian cancer. The treatment plan includes chemotherapy.   CANCER HISTORY:  Oncology History  Ovarian ca ([place])  07/22/2020 Imaging   Ct abdomen and pelvis 1. Large calcified mass in the pelvis that is new from 2010. There is associated large volume ascites with mild peritoneal nodularity/calcification, primarily worrisome for a calcifying ovarian malignancy. 2. Small right pleural effusion with few calcifications in the anterior juxta diaphragmatic space, possibly related to #1. 3. Nonenlarged but calcified periaortic lymph nodes which are also worrisome for spread of disease.   07/29/2020 Procedure   Successful ultrasound-guided diagnostic and therapeutic paracentesis yielding 5 liters of peritoneal fluid.   07/29/2020 Tumor Marker   Patient's tumor was tested for the following markers: CA-125 Results of the tumor marker test revealed 771   07/30/2020 Initial Diagnosis   Ovarian ca ([place])   07/31/2020 Imaging   1. Calcified periesophageal and juxtadiaphragmatic nodules are indicative of metastatic disease. 2. Small right and trace left pleural effusions. 3. Ascites and calcified upper abdominal adenopathy/peritoneal nodules, better  assessed on 07/22/2020. 4. Aortic atherosclerosis (ICD10-I70.0). Coronary artery calcification.   07/31/2020 Cancer Staging   Staging form: Ovary, Fallopian Tube, and Primary Peritoneal Carcinoma, AJCC 8th Edition - Clinical stage from 07/31/2020: Stage IV (cT3, cN1, cM1) - Signed by [name], MD on 07/31/2020 Stage prefix: Initial diagnosis   08/01/2020 Tumor Marker   Patient's tumor was tested for the following markers: CA-125 Results of the tumor marker test revealed 641   08/05/2020 - 08/26/2020 Chemotherapy         08/26/2020 Tumor Marker   Patient's tumor was tested for the following markers: CA-125 Results of the tumor marker test revealed 663.   09/03/2020 Procedure   Successful ultrasound-guided therapeutic paracentesis yielding 5 liters of peritoneal fluid.   09/09/2020 - 09/14/2020 Hospital Admission   She was admitted to the hospital for management of subacute bowel obstruction   09/10/2020 Imaging   A total of approximately 2.9 L of clear yellow fluid was removed.   09/11/2020 Imaging   1. Small bowel obstruction with a transition zone seen within the posterolateral aspect of the left lower quadrant. 2. Large, stable calcified pelvic mass consistent with the patient's known history of ovarian cancer. 3. Mild-to-moderate amount of free fluid within the abdomen and pelvis. 4. Stable hepatic and left renal cysts. 5. Total right hip replacement. 6. Approximately 5 mm anterolisthesis of the L5 vertebral body on S1.   09/13/2020 Imaging   B/L venous Doppler US Right:  No evidence of deep vein thrombosis in the upper extremity. Findings consistent with acute superficial vein thrombosis involving the right forearm portion of the basilic vein.     Left:  No evidence of thrombosis in  the subclavian.   10/07/2020 -  Chemotherapy    Patient is on Treatment Plan: OVARIAN GEMCITABINE D1,8 Q21D      10/07/2020 Tumor Marker   Patient's tumor was tested for the following markers:  CA-125. Results of the tumor marker test revealed 544.   10/22/2020 Procedure   Successful ultrasound-guided therapeutic paracentesis yielding 5 liters of peritoneal fluid.   11/04/2020 Tumor Marker   Patient's tumor was tested for the following markers: CA-125 Results of the tumor marker test revealed 306      RISK FACTORS:  Menarche was at age 6.  First live birth at age 31.  Ovaries intact: yes.  Hysterectomy: no.  Menopausal status: postmenopausal.  HRT use: 0 years. Colonoscopy: yes; not sure if she had one/if she had polyps Mammogram within the last year: no. Number of breast biopsies: 1.   Past Medical History:  Diagnosis Date  . Arthritis    right shoulder replacement, right replacement   . Atrial fibrillation ([place])   . Cancer ([place])   . Chronic pancreatitis ([place])   . Constipation   . Family history of Hodgkin's lymphoma   . Osteopenia   . Pancreatitis 1990s  . Syncope 03/2011    Past Surgical History:  Procedure Laterality Date  . APPENDECTOMY  1955  . CHOLECYSTECTOMY  1998  . CYSTECTOMY  1981   [place]  . JOINT REPLACEMENT     right shoulder 2008; right hip 2016  . [place]  . TOTAL HIP ARTHROPLASTY Right 11/2014   [facility], [place], [place] History   Socioeconomic History  . Marital status: Married    Spouse name: Not on file  . Number of children: 2  . Years of education: 36  . Highest education level: Some college, no degree  Occupational History  . Occupation: retired  Tobacco Use  . Smoking status: Former Smoker    Packs/day: 2.50    Types: Cigarettes    Quit date: 1980    Years since quitting: 42.4  . Smokeless tobacco: Never Used  Vaping Use  . Vaping Use: Never used  Substance and Sexual Activity  . Alcohol use: Not Currently  . Drug use: Never  . Sexual activity: Not on file  Other Topics Concern  . Not on file  Social History Narrative   Lives at home with husband   Social Determinants  of Health   Financial Resource Strain: Not on file  Food Insecurity: Not on file  Transportation Needs: Not on file  Physical Activity: Not on file  Stress: Not on file  Social Connections: Not on file     FAMILY HISTORY:  We obtained a detailed, 4-generation family history.  Significant diagnoses are listed below: Family History  Problem Relation Age of Onset  . Dementia Mother   . Memory loss Brother   . Hodgkin's lymphoma Father   . Kidney failure Brother    [name] has 2 daughters, Erline Levine and [name]. She had 3 brothers and 2 sisters, none have had cancer.  [name] father had Hodgkin lymphoma an died at 60. Patient had 3 paternal aunts, 4 paternal uncles, no cancers for them or for cousins. No cancer for paternal grandparents.  Ms. Chipman mother died at 69, no cancers. Patient had 2 maternal uncles, 3 maternal aunts, no cancers. No cousins with cancer, and maternal grandparents did not have cancer.  [name] is unaware of previous family history of genetic testing for  hereditary cancer risks. Patient's maternal ancestors are of [place] descent, and paternal ancestors are of [place] descent. There is no reported Ashkenazi Jewish ancestry. There is no known consanguinity.    GENETIC COUNSELING ASSESSMENT: [name] is a 80 y.o. female with a personal history of ovarian cancer which is somewhat suggestive of a hereditary cancer syndrome and predisposition to cancer. We, therefore, discussed and recommended the following at today's visit.   DISCUSSION: We discussed that approximately 15-20% of ovarian cancer is hereditary  Most cases of hereditary ovarian cancer are associated with BRCA1/BRCA2 genes, although there are other genes associated with hereditary ovarian cancer as well as other genes associated with other types of cancer.  We discussed that testing is beneficial for several reasons including  knowing about other cancer risks, identifying potential screening and  risk-reduction options that may be appropriate, and to understand if other family members could be at risk for cancer and allow them to undergo genetic testing.   We reviewed the characteristics, features and inheritance patterns of hereditary cancer syndromes. We also discussed genetic testing, including the appropriate family members to test, the process of testing, insurance coverage and turn-around-time for results. We discussed the implications of a negative, positive and/or variant of uncertain significant result. We recommended [name] pursue genetic testing for the [facility] CancerNext-Expanded+RNA panel.   Based on [name]'s personal history of cancer, she meets medical criteria for genetic testing. Despite that she meets criteria, she may still have an out of pocket cost. We discussed that if her out of pocket cost for testing is over $100, the laboratory will call and confirm whether she wants to proceed with testing.  If the out of pocket cost of testing is less than $100 she will be billed by the genetic testing laboratory.   PLAN: After considering the risks, benefits, and limitations, [name] provided informed consent to pursue genetic testing and the blood sample was sent to [facility] for analysis of the CancerNext-Expanded+RNA panel. Results should be available within approximately 2-3 weeks' time, at which point they will be disclosed by telephone to [name], as will any additional recommendations warranted by these results. [name] will receive a summary of her genetic counseling visit and a copy of her results once available. This information will also be available in Epic.   [name]'s questions were answered to her satisfaction today. Our contact information was provided should additional questions or concerns arise. Thank you for the referral and allowing [place] to share in the care of your patient.   Faith Rogue, MS, [facility] Genetic  Counselor [email or web address] Phone: [phone]  The patient was seen for a total of 40 minutes in face-to-face genetic counseling. Patient's husband was present as well as Freight forwarder.  Dr. Grayland Ormond was available for discussion regarding this case.   _______________________________________________________________________ For Office Staff:  Number of people involved in session: 3 Was an Intern/ student involved with case: yes

## 2020-11-18 NOTE — Assessment & Plan Note (Signed)
She was seen by genetic counselor for genetic testing We will proceed with treatment as scheduled She will have repeat CT imaging in 2 weeks

## 2020-11-18 NOTE — Assessment & Plan Note (Signed)
She is not symptomatic. Observe for now 

## 2020-11-18 NOTE — Assessment & Plan Note (Signed)
Her albumin status is low from multiple visits I continue to encourage the patient and her husband high-protein intake as tolerated

## 2020-11-18 NOTE — Patient Instructions (Signed)
[facility]  Discharge Instructions: Thank you for choosing [facility] to provide your oncology and hematology care.   If you have a lab appointment with the Cancer Center, please go directly to the Cancer Center and check in at the registration area.   Wear comfortable clothing and clothing appropriate for easy access to any Portacath or PICC line.   We strive to give you quality time with your provider. You may need to reschedule your appointment if you arrive late (15 or more minutes).  Arriving late affects you and other patients whose appointments are after yours.  Also, if you miss three or more appointments without notifying the office, you may be dismissed from the clinic at the provider's discretion.      For prescription refill requests, have your pharmacy contact our office and allow 72 hours for refills to be completed.    Today you received the following chemotherapy and/or immunotherapy agents Gemzar      To help prevent nausea and vomiting after your treatment, we encourage you to take your nausea medication as directed.  BELOW ARE SYMPTOMS THAT SHOULD BE REPORTED IMMEDIATELY: *FEVER GREATER THAN 100.4 F (38 C) OR HIGHER *CHILLS OR SWEATING *NAUSEA AND VOMITING THAT IS NOT CONTROLLED WITH YOUR NAUSEA MEDICATION *UNUSUAL SHORTNESS OF BREATH *UNUSUAL BRUISING OR BLEEDING *URINARY PROBLEMS (pain or burning when urinating, or frequent urination) *BOWEL PROBLEMS (unusual diarrhea, constipation, pain near the anus) TENDERNESS IN MOUTH AND THROAT WITH OR WITHOUT PRESENCE OF ULCERS (sore throat, sores in mouth, or a toothache) UNUSUAL RASH, SWELLING OR PAIN  UNUSUAL VAGINAL DISCHARGE OR ITCHING   Items with * indicate a potential emergency and should be followed up as soon as possible or go to the Emergency Department if any problems should occur.  Please show the CHEMOTHERAPY ALERT CARD or IMMUNOTHERAPY ALERT CARD at check-in to the  Emergency Department and triage nurse.  Should you have questions after your visit or need to cancel or reschedule your appointment, please contact [facility]  Dept: [phone]  and follow the prompts.  Office hours are 8:00 a.m. to 4:30 p.m. Monday - Friday. Please note that voicemails left after 4:00 p.m. may not be returned until the following business day.  We are closed weekends and major holidays. You have access to a nurse at all times for urgent questions. Please call the main number to the clinic Dept: [phone] and follow the prompts.   For any non-urgent questions, you may also contact your provider using MyChart. We now offer e-Visits for anyone 18 and older to request care online for non-urgent symptoms. For details visit [email or web address].   Also download the MyChart app! Go to the app store, search "MyChart", open the app, select , and log in with your MyChart username and password.  Due to Covid, a mask is required upon entering the hospital/clinic. If you do not have a mask, one will be given to you upon arrival. For doctor visits, patients may have 1 support person aged 18 or older with them. For treatment visits, patients cannot have anyone with them due to current Covid guidelines and our immunocompromised population.   

## 2020-11-19 ENCOUNTER — Encounter: Payer: Self-pay | Admitting: Hematology and Oncology

## 2020-11-19 ENCOUNTER — Ambulatory Visit (HOSPITAL_COMMUNITY)
Admission: RE | Admit: 2020-11-19 | Discharge: 2020-11-19 | Disposition: A | Discharge status: Home / Self Care | Payer: Medicare Other | Source: Ambulatory Visit | Attending: Hematology and Oncology | Admitting: Hematology and Oncology

## 2020-11-19 DIAGNOSIS — C569 Malignant neoplasm of unspecified ovary: Secondary | ICD-10-CM | POA: Insufficient documentation

## 2020-11-19 DIAGNOSIS — R180 Malignant ascites: Secondary | ICD-10-CM | POA: Diagnosis not present

## 2020-11-19 DIAGNOSIS — G4700 Insomnia, unspecified: Secondary | ICD-10-CM | POA: Insufficient documentation

## 2020-11-19 HISTORY — PX: IR PARACENTESIS: IMG2679

## 2020-11-19 MED ORDER — LIDOCAINE HCL (PF) 1 % IJ SOLN
INTRAMUSCULAR | Status: AC
  Filled 2020-11-19: qty 30

## 2020-11-19 NOTE — Progress Notes (Signed)
[place] OFFICE PROGRESS NOTE  Patient Care Team: [name], MD as PCP - General (Family Medicine) [name], MD as Consulting Physician (Cardiology)  ASSESSMENT & PLAN:  Ovarian ca [facility]) She was seen by genetic counselor for genetic testing We will proceed with treatment as scheduled She will have repeat CT imaging in 2 weeks  Anemia due to chronic illness She is not symptomatic Observe for now  Mild protein malnutrition ([place]) Her albumin status is low from multiple visits I continue to encourage the patient and her husband high-protein intake as tolerated  Anorexia She continues to have problems with eating, predominantly anorexia We discussed again the risk and benefits of appetite stimulant She is in agreement to try Remeron for sleep purposes and I am hopeful 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  Malignant ascites She has recurrent ascites and is symptomatic We will proceed with therapeutic paracentesis  Constipation She has recurrent chronic constipation The patient is somewhat leery about taking regular MiraLAX because she is afraid she will not make it to the bathroom Unfortunately, stool softener is not helpful I recommend her to resume taking MiraLAX daily  Insomnia disorder She is profoundly bothered by poor quality sleep It is causing significant quality of life issues Previously, we have recommended the patient to try mirtazapine but due to product label warning, she did not take it as instructed Again, we discussed the risk and benefits of over-the-counter remedies such as Benadryl versus another trial of mirtazapine and ultimately, she is in agreement to try   Orders Placed This Encounter  Procedures  . IR Paracentesis    Standing Status:   Future    Standing Expiration Date:   11/18/2021    Order Specific Question:   If therapeutic, is there a maximum amount of fluid to be removed?    Answer:   Yes    Order  Specific Question:   What is the maximum amount of fluide to be removed?    Answer:   5 liters    Order Specific Question:   Are labs required for specimen collection?    Answer:   No    Order Specific Question:   Is Albumin medication needed?    Answer:   No    Order Specific Question:   Reason for Exam (SYMPTOM  OR DIAGNOSIS REQUIRED)    Answer:   recurrent ascites    Order Specific Question:   Preferred Imaging Location?    Answer:   [facility]  . CT Abdomen Pelvis Wo Contrast    Standing Status:   Future    Standing Expiration Date:   11/18/2021    Order Specific Question:   Preferred imaging location?    Answer:   [facility]    Order Specific Question:   Is Oral Contrast requested for this exam?    Answer:   Yes, Per Radiology protocol    All questions were answered. The patient knows to call the clinic with any problems, questions or concerns. The total time spent in the appointment was 40 minutes encounter with patients including review of chart and various tests results, discussions about plan of care and coordination of care plan   [name], MD 11/19/2020 10:37 AM  INTERVAL HISTORY: Please see below for problem oriented charting. She returns with her husband for further follow-up 2 of her daughters are also present over the phone to collaborate history 2 #1 complaint she has is  difficulties with sleep She also have very poor appetite and unable to eat because the thought of eating is prohibitive She also have intermittent constipation to the point she had mild hemorrhoids She denies abdominal pain but is getting uncomfortable with recurrent ascites She denies recent falls No recent nausea or vomiting  SUMMARY OF ONCOLOGIC HISTORY: Oncology History  Ovarian ca ([place])  07/22/2020 Imaging   Ct abdomen and pelvis 1. Large calcified mass in the pelvis that is new from 2010. There is associated large volume ascites with mild peritoneal  nodularity/calcification, primarily worrisome for a calcifying ovarian malignancy. 2. Small right pleural effusion with few calcifications in the anterior juxta diaphragmatic space, possibly related to #1. 3. Nonenlarged but calcified periaortic lymph nodes which are also worrisome for spread of disease.   07/29/2020 Procedure   Successful ultrasound-guided diagnostic and therapeutic paracentesis yielding 5 liters of peritoneal fluid.   07/29/2020 Tumor Marker   Patient's tumor was tested for the following markers: CA-125 Results of the tumor marker test revealed 771   07/30/2020 Initial Diagnosis   Ovarian ca ([place])   07/31/2020 Imaging   1. Calcified periesophageal and juxtadiaphragmatic nodules are indicative of metastatic disease. 2. Small right and trace left pleural effusions. 3. Ascites and calcified upper abdominal adenopathy/peritoneal nodules, better assessed on 07/22/2020. 4. Aortic atherosclerosis (ICD10-I70.0). Coronary artery calcification.   07/31/2020 Cancer Staging   Staging form: Ovary, Fallopian Tube, and Primary Peritoneal Carcinoma, AJCC 8th Edition - Clinical stage from 07/31/2020: Stage IV (cT3, cN1, cM1) - Signed by [name], MD on 07/31/2020 Stage prefix: Initial diagnosis   08/01/2020 Tumor Marker   Patient's tumor was tested for the following markers: CA-125 Results of the tumor marker test revealed 641   08/05/2020 - 08/26/2020 Chemotherapy         08/26/2020 Tumor Marker   Patient's tumor was tested for the following markers: CA-125 Results of the tumor marker test revealed 663.   09/03/2020 Procedure   Successful ultrasound-guided therapeutic paracentesis yielding 5 liters of peritoneal fluid.   09/09/2020 - 09/14/2020 Hospital Admission   She was admitted to the hospital for management of subacute bowel obstruction   09/10/2020 Imaging   A total of approximately 2.9 L of clear yellow fluid was removed.   09/11/2020 Imaging   1. Small bowel obstruction with a  transition zone seen within the posterolateral aspect of the left lower quadrant. 2. Large, stable calcified pelvic mass consistent with the patient's known history of ovarian cancer. 3. Mild-to-moderate amount of free fluid within the abdomen and pelvis. 4. Stable hepatic and left renal cysts. 5. Total right hip replacement. 6. Approximately 5 mm anterolisthesis of the L5 vertebral body on S1.   09/13/2020 Imaging   B/L venous Doppler US Right:  No evidence of deep vein thrombosis in the upper extremity. Findings consistent with acute superficial vein thrombosis involving the right forearm portion of the basilic vein.     Left:  No evidence of thrombosis in the subclavian.   10/07/2020 -  Chemotherapy    Patient is on Treatment Plan: OVARIAN GEMCITABINE D1,8 Q21D      10/07/2020 Tumor Marker   Patient's tumor was tested for the following markers: CA-125. Results of the tumor marker test revealed 544.   10/22/2020 Procedure   Successful ultrasound-guided therapeutic paracentesis yielding 5 liters of peritoneal fluid.   11/04/2020 Tumor Marker   Patient's tumor was tested for the following markers: CA-125 Results of the tumor marker test revealed 306  REVIEW OF SYSTEMS:   Constitutional: Denies fevers, chills or abnormal weight loss Eyes: Denies blurriness of vision Ears, nose, mouth, throat, and face: Denies mucositis or sore throat Respiratory: Denies cough, dyspnea or wheezes Cardiovascular: Denies palpitation, chest discomfort or lower extremity swelling Skin: Denies abnormal skin rashes Lymphatics: Denies new lymphadenopathy or easy bruising Neurological:Denies numbness, tingling or new weaknesses Behavioral/Psych: Mood is stable, no new changes  All other systems were reviewed with the patient and are negative.  I have reviewed the past medical history, past surgical history, social history and family history with the patient and they are unchanged from previous  note.  ALLERGIES:  is allergic to cephalexin, hydrocodone-guaifenesin, other, tape, and tramadol hcl.  MEDICATIONS:  Current Outpatient Medications  Medication Sig Dispense Refill  . mirtazapine (REMERON) 15 MG tablet Take 15 mg by mouth at bedtime.    . Ensure (ENSURE) 219ml    . magnesium oxide (MAG-OX) 400 (240 Mg) MG tablet Take by mouth.    . megestrol (MEGACE) 40 MG/ML suspension 1 ml    . OLANZapine (ZYPREXA) 2.5 MG tablet Take by mouth.    . ondansetron (ZOFRAN) 8 MG tablet TAKE 1 TABLET BY MOUTH EVERY 8 HOURS AS NEEDED FOR REFRACTORY NAUSEA AND/OR VOMITING. START ON DAY 3 AFTER CARBOPLATIN CHEMO 30 tablet 1  . oxycodone (OXY-IR) 5 MG capsule 1 Tablet    . oxyCODONE (ROXICODONE) 5 MG immediate release tablet Take 1 tablet (5 mg total) by mouth every 4 (four) hours as needed for severe pain. 30 tablet 0  . polyethylene glycol (MIRALAX / GLYCOLAX) 17 g packet Take 17 g by mouth daily as needed for severe constipation. 14 each 0  . prochlorperazine (COMPAZINE) 10 MG tablet Take 1 tablet (10 mg total) by mouth every 6 (six) hours as needed (Nausea or vomiting). 5 tablet 0  . prochlorperazine (COMPAZINE) 10 MG tablet 1 tablet as needed    . Prochlorperazine Maleate (COMPAZINE PO)     . traMADol (ULTRAM) 50 MG tablet Take by mouth.     No current facility-administered medications for this visit.    PHYSICAL EXAMINATION: ECOG PERFORMANCE STATUS: 2 - Symptomatic, <50% confined to bed  Vitals:   11/18/20 1310  BP: 128/71  Pulse: 77  Resp: 18  Temp: 97.6 F (36.4 C)  SpO2: 99%   Filed Weights   11/18/20 1310  Weight: 132 lb 12.8 oz (60.2 kg)    GENERAL:alert, no distress and comfortable.  She looks cachectic SKIN: skin color, texture, turgor are normal, no rashes or significant lesions EYES: normal, Conjunctiva are pink and non-injected, sclera clear OROPHARYNX:no exudate, no erythema and lips, buccal mucosa, and tongue normal  NECK: supple, thyroid normal size, non-tender,  without nodularity LYMPH:  no palpable lymphadenopathy in the cervical, axillary or inguinal LUNGS: clear to auscultation and percussion with normal breathing effort HEART: regular rate & rhythm and no murmurs and no lower extremity edema ABDOMEN:abdomen distended with ascites Musculoskeletal:no cyanosis of digits and no clubbing  NEURO: alert & oriented x 3 with fluent speech, no focal motor/sensory deficits  LABORATORY DATA:  I have reviewed the data as listed    Component Value Date/Time   NA 136 11/18/2020 1245   NA 141 05/29/2019 1143   K 3.8 11/18/2020 1245   CL 103 11/18/2020 1245   CO2 25 11/18/2020 1245   GLUCOSE 159 (H) 11/18/2020 1245   BUN 27 (H) 11/18/2020 1245   BUN 29 (H) 05/29/2019 1143   CREATININE 0.80 11/18/2020  1245   CALCIUM 8.7 (L) 11/18/2020 1245   PROT 5.6 (L) 11/18/2020 1245   ALBUMIN 2.5 (L) 11/18/2020 1245   AST 11 (L) 11/18/2020 1245   ALT 7 11/18/2020 1245   ALKPHOS 77 11/18/2020 1245   BILITOT 0.2 (L) 11/18/2020 1245   GFRNONAA >60 11/18/2020 1245   GFRAA 74 05/29/2019 1143    No results found for: SPEP, UPEP  Lab Results  Component Value Date   WBC 5.5 11/18/2020   NEUTROABS 4.0 11/18/2020   HGB 10.4 (L) 11/18/2020   HCT 31.3 (L) 11/18/2020   MCV 87.7 11/18/2020   PLT 420 (H) 11/18/2020      Chemistry      Component Value Date/Time   NA 136 11/18/2020 1245   NA 141 05/29/2019 1143   K 3.8 11/18/2020 1245   CL 103 11/18/2020 1245   CO2 25 11/18/2020 1245   BUN 27 (H) 11/18/2020 1245   BUN 29 (H) 05/29/2019 1143   CREATININE 0.80 11/18/2020 1245      Component Value Date/Time   CALCIUM 8.7 (L) 11/18/2020 1245   ALKPHOS 77 11/18/2020 1245   AST 11 (L) 11/18/2020 1245   ALT 7 11/18/2020 1245   BILITOT 0.2 (L) 11/18/2020 1245

## 2020-11-19 NOTE — Procedures (Signed)
PROCEDURE SUMMARY:  Successful image-guided paracentesis from the right lower abdomen.  Yielded 5 liters of clear yellow fluid.  No immediate complications.  EBL = trace. Patient tolerated well.   Specimen was not sent for labs.  Please see imaging section of Epic for full dictation.   Armando Gang Nadirah Socorro PA-C 11/19/2020 2:33 PM

## 2020-11-19 NOTE — Assessment & Plan Note (Signed)
She has recurrent ascites and is symptomatic We will proceed with therapeutic paracentesis

## 2020-11-19 NOTE — Assessment & Plan Note (Signed)
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

## 2020-11-19 NOTE — Assessment & Plan Note (Signed)
She has recurrent chronic constipation The patient is somewhat leery about taking regular MiraLAX because she is afraid she will not make it to the bathroom Unfortunately, stool softener is not helpful I recommend her to resume taking MiraLAX daily

## 2020-11-19 NOTE — Progress Notes (Signed)
Update 5/24: Discussed with Ms. Auble that we can also do somatic testing, called HRD testing through a different lab. Ms. Stallone agreed to this testing and we will send the order in today for [facility].

## 2020-11-19 NOTE — Assessment & Plan Note (Signed)
She is profoundly bothered by poor quality sleep It is causing significant quality of life issues Previously, we have recommended the patient to try mirtazapine but due to product label warning, she did not take it as instructed Again, we discussed the risk and benefits of over-the-counter remedies such as Benadryl versus another trial of mirtazapine and ultimately, she is in agreement to try

## 2020-11-26 ENCOUNTER — Telehealth: Payer: Self-pay

## 2020-11-26 NOTE — Telephone Encounter (Signed)
Patient's spouse notified, verbalized understanding and agreement.

## 2020-11-26 NOTE — Telephone Encounter (Signed)
I agree with assessment Nothing further to add except would be helpful to see what she eats; if husband can make a food diary again that would be helpful

## 2020-11-26 NOTE — Telephone Encounter (Signed)
Pt's spouse called regarding constipation.    Patient is s/p D1C3 Gemzar.   Patient told spouse "it's been weeks since I have had a regular bowel movement, I have little ones here and there."  Patient able to pass gas.  Drinking approximally 32 oz of fluids daily.     RN reviewed medications, MiraLAX AM/PM, along with sennakot 2 tablets BID.  Spouse held sennakot yesterday.    RN encouraged fluid intake of 64 oz.  Will forward to MD for review.  Continue Miralax, and add sennakot 2 tablets TID?

## 2020-11-27 ENCOUNTER — Telehealth: Payer: Self-pay

## 2020-11-27 NOTE — Telephone Encounter (Signed)
Husband called and left a message to call him.  Called back. He wanted to give a update on Yesha. X 2 bm's yesterday, no bm today. He is questioning if they should continue Miralax and Senokot. Instructed to continue Miralax and Senokot. Reminded of appts tomorrow. Husband verbalized understanding.

## 2020-11-28 ENCOUNTER — Inpatient Hospital Stay: Payer: Medicare Other | Admitting: Nutrition

## 2020-11-28 ENCOUNTER — Other Ambulatory Visit: Payer: Self-pay

## 2020-11-28 ENCOUNTER — Other Ambulatory Visit: Payer: Medicare Other

## 2020-11-28 ENCOUNTER — Inpatient Hospital Stay: Payer: Medicare Other | Attending: Hematology and Oncology

## 2020-11-28 ENCOUNTER — Inpatient Hospital Stay: Payer: Medicare Other

## 2020-11-28 ENCOUNTER — Inpatient Hospital Stay (HOSPITAL_BASED_OUTPATIENT_CLINIC_OR_DEPARTMENT_OTHER): Payer: Medicare Other | Admitting: Hematology and Oncology

## 2020-11-28 ENCOUNTER — Encounter: Payer: Self-pay | Admitting: Hematology and Oncology

## 2020-11-28 DIAGNOSIS — K219 Gastro-esophageal reflux disease without esophagitis: Secondary | ICD-10-CM | POA: Diagnosis not present

## 2020-11-28 DIAGNOSIS — R1011 Right upper quadrant pain: Secondary | ICD-10-CM | POA: Diagnosis not present

## 2020-11-28 DIAGNOSIS — C569 Malignant neoplasm of unspecified ovary: Secondary | ICD-10-CM

## 2020-11-28 DIAGNOSIS — K449 Diaphragmatic hernia without obstruction or gangrene: Secondary | ICD-10-CM | POA: Diagnosis not present

## 2020-11-28 DIAGNOSIS — C561 Malignant neoplasm of right ovary: Secondary | ICD-10-CM | POA: Diagnosis not present

## 2020-11-28 DIAGNOSIS — K436 Other and unspecified ventral hernia with obstruction, without gangrene: Secondary | ICD-10-CM | POA: Diagnosis not present

## 2020-11-28 DIAGNOSIS — N281 Cyst of kidney, acquired: Secondary | ICD-10-CM | POA: Diagnosis not present

## 2020-11-28 DIAGNOSIS — Z5111 Encounter for antineoplastic chemotherapy: Secondary | ICD-10-CM | POA: Diagnosis not present

## 2020-11-28 DIAGNOSIS — R748 Abnormal levels of other serum enzymes: Secondary | ICD-10-CM | POA: Diagnosis not present

## 2020-11-28 DIAGNOSIS — R634 Abnormal weight loss: Secondary | ICD-10-CM | POA: Diagnosis not present

## 2020-11-28 DIAGNOSIS — E43 Unspecified severe protein-calorie malnutrition: Secondary | ICD-10-CM

## 2020-11-28 DIAGNOSIS — R1909 Other intra-abdominal and pelvic swelling, mass and lump: Secondary | ICD-10-CM | POA: Diagnosis not present

## 2020-11-28 DIAGNOSIS — R180 Malignant ascites: Secondary | ICD-10-CM

## 2020-11-28 DIAGNOSIS — K56609 Unspecified intestinal obstruction, unspecified as to partial versus complete obstruction: Secondary | ICD-10-CM | POA: Diagnosis not present

## 2020-11-28 DIAGNOSIS — K7290 Hepatic failure, unspecified without coma: Secondary | ICD-10-CM | POA: Diagnosis not present

## 2020-11-28 DIAGNOSIS — D638 Anemia in other chronic diseases classified elsewhere: Secondary | ICD-10-CM

## 2020-11-28 DIAGNOSIS — K7200 Acute and subacute hepatic failure without coma: Secondary | ICD-10-CM | POA: Diagnosis not present

## 2020-11-28 DIAGNOSIS — C562 Malignant neoplasm of left ovary: Secondary | ICD-10-CM | POA: Diagnosis not present

## 2020-11-28 DIAGNOSIS — R188 Other ascites: Secondary | ICD-10-CM | POA: Diagnosis not present

## 2020-11-28 DIAGNOSIS — E871 Hypo-osmolality and hyponatremia: Secondary | ICD-10-CM | POA: Diagnosis not present

## 2020-11-28 LAB — CMP (CANCER CENTER ONLY)
ALT: 74 U/L — ABNORMAL HIGH (ref 0–44)
AST: 43 U/L — ABNORMAL HIGH (ref 15–41)
Albumin: 2.3 g/dL — ABNORMAL LOW (ref 3.5–5.0)
Alkaline Phosphatase: 409 U/L — ABNORMAL HIGH (ref 38–126)
Anion gap: 10 (ref 5–15)
BUN: 24 mg/dL — ABNORMAL HIGH (ref 8–23)
CO2: 24 mmol/L (ref 22–32)
Calcium: 8.5 mg/dL — ABNORMAL LOW (ref 8.9–10.3)
Chloride: 100 mmol/L (ref 98–111)
Creatinine: 0.83 mg/dL (ref 0.44–1.00)
GFR, Estimated: >60 mL/min (ref >60)
Glucose, Bld: 141 mg/dL — ABNORMAL HIGH (ref 70–99)
Potassium: 3.6 mmol/L (ref 3.5–5.1)
Sodium: 134 mmol/L — ABNORMAL LOW (ref 135–145)
Total Bilirubin: 0.2 mg/dL — ABNORMAL LOW (ref 0.3–1.2)
Total Protein: 5.4 g/dL — ABNORMAL LOW (ref 6.5–8.1)

## 2020-11-28 LAB — CBC WITH DIFFERENTIAL (CANCER CENTER ONLY)
Abs Immature Granulocytes: 0.03 10*3/uL (ref 0.00–0.07)
Basophils Absolute: 0 10*3/uL (ref 0.0–0.1)
Basophils Relative: 0 %
Eosinophils Absolute: 0.1 10*3/uL (ref 0.0–0.5)
Eosinophils Relative: 1 %
HCT: 30.9 % — ABNORMAL LOW (ref 36.0–46.0)
Hemoglobin: 10.3 g/dL — ABNORMAL LOW (ref 12.0–15.0)
Immature Granulocytes: 1 %
Lymphocytes Relative: 15 %
Lymphs Abs: 0.8 10*3/uL (ref 0.7–4.0)
MCH: 28.9 pg (ref 26.0–34.0)
MCHC: 33.3 g/dL (ref 30.0–36.0)
MCV: 86.8 fL (ref 80.0–100.0)
Monocytes Absolute: 0.7 10*3/uL (ref 0.1–1.0)
Monocytes Relative: 13 %
Neutro Abs: 3.7 10*3/uL (ref 1.7–7.7)
Neutrophils Relative %: 70 %
Platelet Count: 207 10*3/uL (ref 150–400)
RBC: 3.56 MIL/uL — ABNORMAL LOW (ref 3.87–5.11)
RDW: 15.3 % (ref 11.5–15.5)
WBC Count: 5.2 10*3/uL (ref 4.0–10.5)
nRBC: 0 % (ref 0.0–0.2)

## 2020-11-28 MED ORDER — PROCHLORPERAZINE MALEATE 10 MG PO TABS
10.0000 mg | ORAL_TABLET | Freq: Once | ORAL | Status: AC
  Administered 2020-11-28: 10 mg via ORAL

## 2020-11-28 MED ORDER — SODIUM CHLORIDE 0.9 % IV SOLN
Freq: Once | INTRAVENOUS | Status: AC
  Filled 2020-11-28: qty 250

## 2020-11-28 MED ORDER — SODIUM CHLORIDE 0.9 % IV SOLN
800.0000 mg/m2 | Freq: Once | INTRAVENOUS | Status: AC
  Administered 2020-11-28: 1254 mg via INTRAVENOUS
  Filled 2020-11-28: qty 32.98

## 2020-11-28 MED ORDER — PROCHLORPERAZINE MALEATE 10 MG PO TABS
ORAL_TABLET | ORAL | Status: AC
  Filled 2020-11-28: qty 1

## 2020-11-28 NOTE — Assessment & Plan Note (Signed)
She is not symptomatic. Observe for now 

## 2020-11-28 NOTE — Progress Notes (Signed)
Nutrition follow-up completed with patient during infusion for ovarian cancer. Weight decreased and documented as 124.6 pounds June 2, decreased from 130.2 pounds.  Patient is status post paracentesis on May 24 with 5 L removed.  She reports some ongoing issues with constipation.  Recently had 2 BMs on May 31 and is taking MiraLAX.  She tries to drink increased fluids.  Reports she thinks she lost nutrition facts sheets given to her previously and is requesting another copy.  Patient really does not care much for nutrition supplements.  Nutrition diagnosis: Unintended weight loss continues.  Intervention: Educated patient on strategies for improving constipation.  Provided nutrition facts sheet.   Encouraged increased fluid intake.  Suggested trying 4 ounces of prune juice every morning. Reviewed high-protein snacks and provided additional fact sheets.  Monitoring, evaluation, goals: Patient will tolerate adequate calories and protein to minimize loss of lean body mass.  Next visit: To be scheduled as needed.  Patient has contact information.  **Disclaimer: This note was dictated with voice recognition software. Similar sounding words can inadvertently be transcribed and this note may contain transcription errors which may not have been corrected upon publication of note.**

## 2020-11-28 NOTE — Assessment & Plan Note (Signed)
She continues to have significant weight loss Despite numerous discussions about the use of mirtazapine to help with mood, sleep and appetite, her husband is fearful of side effects and she has not started to take them medication She has appointment to see dietitian

## 2020-11-28 NOTE — Patient Instructions (Signed)
[facility]COL[facility]  Discharge Instructions: Thank you for choosing [facility] to provide your oncology and hematology care.   If you have a lab appointment with the Cancer Center, please go directly to the Cancer Center and check in at the registration area.   Wear comfortable clothing and clothing appropriate for easy access to any Portacath or PICC line.   We strive to give you quality time with your provider. You may need to reschedule your appointment if you arrive late (15 or more minutes).  Arriving late affects you and other patients whose appointments are after yours.  Also, if you miss three or more appointments without notifying the office, you may be dismissed from the clinic at the provider's discretion.      For prescription refill requests, have your pharmacy contact our office and allow 72 hours for refills to be completed.    Today you received the following chemotherapy and/or immunotherapy agents Gemzar      To help prevent nausea and vomiting after your treatment, we encourage you to take your nausea medication as directed.  BELOW ARE SYMPTOMS THAT SHOULD BE REPORTED IMMEDIATELY: *FEVER GREATER THAN 100.4 F (38 C) OR HIGHER *CHILLS OR SWEATING *NAUSEA AND VOMITING THAT IS NOT CONTROLLED WITH YOUR NAUSEA MEDICATION *UNUSUAL SHORTNESS OF BREATH *UNUSUAL BRUISING OR BLEEDING *URINARY PROBLEMS (pain or burning when urinating, or frequent urination) *BOWEL PROBLEMS (unusual diarrhea, constipation, pain near the anus) TENDERNESS IN MOUTH AND THROAT WITH OR WITHOUT PRESENCE OF ULCERS (sore throat, sores in mouth, or a toothache) UNUSUAL RASH, SWELLING OR PAIN  UNUSUAL VAGINAL DISCHARGE OR ITCHING   Items with * indicate a potential emergency and should be followed up as soon as possible or go to the Emergency Department if any problems should occur.  Please show the CHEMOTHERAPY ALERT CARD or IMMUNOTHERAPY ALERT CARD at check-in to the  Emergency Department and triage nurse.  Should you have questions after your visit or need to cancel or reschedule your appointment, please contact [facility]  Dept: [phone]  and follow the prompts.  Office hours are 8:00 a.m. to 4:30 p.m. Monday - Friday. Please note that voicemails left after 4:00 p.m. may not be returned until the following business day.  We are closed weekends and major holidays. You have access to a nurse at all times for urgent questions. Please call the main number to the clinic Dept: [phone] and follow the prompts.   For any non-urgent questions, you may also contact your provider using MyChart. We now offer e-Visits for anyone 18 and older to request care online for non-urgent symptoms. For details visit [email or web address].   Also download the MyChart app! Go to the app store, search "MyChart", open the app, select , and log in with your MyChart username and password.  Due to Covid, a mask is required upon entering the hospital/clinic. If you do not have a mask, one will be given to you upon arrival. For doctor visits, patients may have 1 support person aged 18 or older with them. For treatment visits, patients cannot have anyone with them due to current Covid guidelines and our immunocompromised population.   

## 2020-11-28 NOTE — Assessment & Plan Note (Signed)
Leg edema is due to protein-losing state Despite her best effort to eat more, she continues to have progression of protein calorie malnutrition and weight loss I recommend her husband to document her oral intake She has appointment to see dietitian today

## 2020-11-28 NOTE — Progress Notes (Signed)
[facility] OFFICE PROGRESS NOTE  Patient Care Team: [name], MD as PCP - General (Family Medicine) [name], MD as Consulting Physician (Cardiology)  ASSESSMENT & PLAN:  Ovarian ca [facility]) Clinically, she continues to decline Even though her tumor marker was improving, I suspect it is because of repeat paracentesis She has rapidly reaccumulated malignant ascites on exam Her nutritional status continues to decline I am not hopeful that we do not see much better improvement on her CT imaging next week I encouraged her daughter to be present for next week's meeting so that we can have further discussion about plan of care Due to significant weight loss, I reduce the dose of chemotherapy today  Anemia due to chronic illness She is not symptomatic Observe for now  Malignant ascites She has recurrent malignant ascites on exam She is not symptomatic In the absence of symptoms, I do not recommend repeat paracentesis I explained the rationale to the patient and her husband and they are in agreement  Weight loss, abnormal She continues to have significant weight loss Despite numerous discussions about the use of mirtazapine to help with mood, sleep and appetite, her husband is fearful of side effects and she has not started to take them medication She has appointment to see dietitian  Severe protein-calorie malnutrition ([place]) Leg edema is due to protein-losing state Despite her best effort to eat more, she continues to have progression of protein calorie malnutrition and weight loss I recommend her husband to document her oral intake She has appointment to see dietitian today  Elevated liver enzymes She has abnormal elevated liver enzymes likely due to side effects of chemo As above, I will reduce the dose of her treatment Observe for now   No orders of the defined types were placed in this encounter.   All questions were answered. The patient knows to  call the clinic with any problems, questions or concerns. The total time spent in the appointment was 30 minutes encounter with patients including review of chart and various tests results, discussions about plan of care and coordination of care plan   [name], MD 11/28/2020 3:12 PM  INTERVAL HISTORY: Please see below for problem oriented charting. She returns with her husband for further follow-up I also spoke with her daughter, [name] over the phone According to her husband, she ate very little every day She will eat half a cup of cereal with milk, a small serving of chicken salad and some noodles in the evening She drinks approximately 2 Ensure with protein twice daily and may be some protein powder drink She have lost a lot of weight since last time I saw her She had recurrent ascites She had recent constipation but that resolved with laxatives The patient has not started taking mirtazapine; her husband has not been giving it to her because he was fearful of side effects  SUMMARY OF ONCOLOGIC HISTORY: Oncology History  Ovarian ca ([place])  07/22/2020 Imaging   Ct abdomen and pelvis 1. Large calcified mass in the pelvis that is new from 2010. There is associated large volume ascites with mild peritoneal nodularity/calcification, primarily worrisome for a calcifying ovarian malignancy. 2. Small right pleural effusion with few calcifications in the anterior juxta diaphragmatic space, possibly related to #1. 3. Nonenlarged but calcified periaortic lymph nodes which are also worrisome for spread of disease.   07/29/2020 Procedure   Successful ultrasound-guided diagnostic and therapeutic paracentesis yielding 5 liters of peritoneal fluid.   07/29/2020  Tumor Marker   Patient's tumor was tested for the following markers: CA-125 Results of the tumor marker test revealed 771   07/30/2020 Initial Diagnosis   Ovarian ca ([place])   07/31/2020 Imaging   1. Calcified periesophageal and  juxtadiaphragmatic nodules are indicative of metastatic disease. 2. Small right and trace left pleural effusions. 3. Ascites and calcified upper abdominal adenopathy/peritoneal nodules, better assessed on 07/22/2020. 4. Aortic atherosclerosis (ICD10-I70.0). Coronary artery calcification.   07/31/2020 Cancer Staging   Staging form: Ovary, Fallopian Tube, and Primary Peritoneal Carcinoma, AJCC 8th Edition - Clinical stage from 07/31/2020: Stage IV (cT3, cN1, cM1) - Signed by [name], MD on 07/31/2020 Stage prefix: Initial diagnosis   08/01/2020 Tumor Marker   Patient's tumor was tested for the following markers: CA-125 Results of the tumor marker test revealed 641   08/05/2020 - 08/26/2020 Chemotherapy         08/26/2020 Tumor Marker   Patient's tumor was tested for the following markers: CA-125 Results of the tumor marker test revealed 663.   09/03/2020 Procedure   Successful ultrasound-guided therapeutic paracentesis yielding 5 liters of peritoneal fluid.   09/09/2020 - 09/14/2020 Hospital Admission   She was admitted to the hospital for management of subacute bowel obstruction   09/10/2020 Imaging   A total of approximately 2.9 L of clear yellow fluid was removed.   09/11/2020 Imaging   1. Small bowel obstruction with a transition zone seen within the posterolateral aspect of the left lower quadrant. 2. Large, stable calcified pelvic mass consistent with the patient's known history of ovarian cancer. 3. Mild-to-moderate amount of free fluid within the abdomen and pelvis. 4. Stable hepatic and left renal cysts. 5. Total right hip replacement. 6. Approximately 5 mm anterolisthesis of the L5 vertebral body on S1.   09/13/2020 Imaging   B/L venous Doppler US Right:  No evidence of deep vein thrombosis in the upper extremity. Findings consistent with acute superficial vein thrombosis involving the right forearm portion of the basilic vein.     Left:  No evidence of thrombosis in the  subclavian.   10/07/2020 -  Chemotherapy    Patient is on Treatment Plan: OVARIAN GEMCITABINE D1,8 Q21D      10/07/2020 Tumor Marker   Patient's tumor was tested for the following markers: CA-125. Results of the tumor marker test revealed 544.   10/22/2020 Procedure   Successful ultrasound-guided therapeutic paracentesis yielding 5 liters of peritoneal fluid.   11/04/2020 Tumor Marker   Patient's tumor was tested for the following markers: CA-125 Results of the tumor marker test revealed 306   11/19/2020 Procedure   Successful ultrasound-guided paracentesis yielding 5 liters of peritoneal fluid.     REVIEW OF SYSTEMS:   Constitutional: Denies fevers, chills  Eyes: Denies blurriness of vision Ears, nose, mouth, throat, and face: Denies mucositis or sore throat Respiratory: Denies cough, dyspnea or wheezes Cardiovascular: Denies palpitation, chest discomfort GSkin: Denies abnormal skin rashes Lymphatics: Denies new lymphadenopathy or easy bruising Neurological:Denies numbness, tingling or new weaknesses Behavioral/Psych: Mood is stable, no new changes  All other systems were reviewed with the patient and are negative.  I have reviewed the past medical history, past surgical history, social history and family history with the patient and they are unchanged from previous note.  ALLERGIES:  is allergic to cephalexin, hydrocodone-guaifenesin, other, tape, and tramadol hcl.  MEDICATIONS:  Current Outpatient Medications  Medication Sig Dispense Refill  . Ensure (ENSURE) 214ml    . magnesium oxide (MAG-OX) 400 (240  Mg) MG tablet Take by mouth.    . mirtazapine (REMERON) 15 MG tablet Take 15 mg by mouth at bedtime.    . ondansetron (ZOFRAN) 8 MG tablet TAKE 1 TABLET BY MOUTH EVERY 8 HOURS AS NEEDED FOR REFRACTORY NAUSEA AND/OR VOMITING. START ON DAY 3 AFTER CARBOPLATIN CHEMO 30 tablet 1  . polyethylene glycol (MIRALAX / GLYCOLAX) 17 g packet Take 17 g by mouth daily as needed for severe  constipation. 14 each 0  . prochlorperazine (COMPAZINE) 10 MG tablet Take 1 tablet (10 mg total) by mouth every 6 (six) hours as needed (Nausea or vomiting). 5 tablet 0  . prochlorperazine (COMPAZINE) 10 MG tablet 1 tablet as needed    . Prochlorperazine Maleate (COMPAZINE PO)      No current facility-administered medications for this visit.    PHYSICAL EXAMINATION: ECOG PERFORMANCE STATUS: 2 - Symptomatic, <50% confined to bed  Vitals:   11/28/20 1301  BP: 120/62  Pulse: 72  Resp: 18  Temp: (!) 97.4 F (36.3 C)  SpO2: 100%   Filed Weights   11/28/20 1301  Weight: 124 lb 9.6 oz (56.5 kg)    GENERAL:alert, no distress and comfortable.  She looks malnourished and cachectic SKIN: skin color, texture, turgor are normal, no rashes or significant lesions EYES: normal, Conjunctiva are pink and non-injected, sclera clear OROPHARYNX:no exudate, no erythema and lips, buccal mucosa, and tongue normal  NECK: supple, thyroid normal size, non-tender, without nodularity LYMPH:  no palpable lymphadenopathy in the cervical, axillary or inguinal LUNGS: clear to auscultation and percussion with normal breathing effort HEART: regular rate & rhythm and no murmurs with moderate bilateral lower extremity edema ABDOMEN:abdomen soft, distended with ascites Musculoskeletal:no cyanosis of digits and no clubbing  NEURO: alert & oriented x 3 with fluent speech, no focal motor/sensory deficits  LABORATORY DATA:  I have reviewed the data as listed    Component Value Date/Time   NA 134 (L) 11/28/2020 1229   NA 141 05/29/2019 1143   K 3.6 11/28/2020 1229   CL 100 11/28/2020 1229   CO2 24 11/28/2020 1229   GLUCOSE 141 (H) 11/28/2020 1229   BUN 24 (H) 11/28/2020 1229   BUN 29 (H) 05/29/2019 1143   CREATININE 0.83 11/28/2020 1229   CALCIUM 8.5 (L) 11/28/2020 1229   PROT 5.4 (L) 11/28/2020 1229   ALBUMIN 2.3 (L) 11/28/2020 1229   AST 43 (H) 11/28/2020 1229   ALT 74 (H) 11/28/2020 1229   ALKPHOS 409  (H) 11/28/2020 1229   BILITOT 0.2 (L) 11/28/2020 1229   GFRNONAA >60 11/28/2020 1229   GFRAA 74 05/29/2019 1143    No results found for: SPEP, UPEP  Lab Results  Component Value Date   WBC 5.2 11/28/2020   NEUTROABS 3.7 11/28/2020   HGB 10.3 (L) 11/28/2020   HCT 30.9 (L) 11/28/2020   MCV 86.8 11/28/2020   PLT 207 11/28/2020      Chemistry      Component Value Date/Time   NA 134 (L) 11/28/2020 1229   NA 141 05/29/2019 1143   K 3.6 11/28/2020 1229   CL 100 11/28/2020 1229   CO2 24 11/28/2020 1229   BUN 24 (H) 11/28/2020 1229   BUN 29 (H) 05/29/2019 1143   CREATININE 0.83 11/28/2020 1229      Component Value Date/Time   CALCIUM 8.5 (L) 11/28/2020 1229   ALKPHOS 409 (H) 11/28/2020 1229   AST 43 (H) 11/28/2020 1229   ALT 74 (H) 11/28/2020 1229   BILITOT  0.2 (L) 11/28/2020 1229

## 2020-11-28 NOTE — Assessment & Plan Note (Signed)
She has recurrent malignant ascites on exam She is not symptomatic In the absence of symptoms, I do not recommend repeat paracentesis I explained the rationale to the patient and her husband and they are in agreement

## 2020-11-28 NOTE — Assessment & Plan Note (Signed)
She has abnormal elevated liver enzymes likely due to side effects of chemo As above, I will reduce the dose of her treatment Observe for now

## 2020-11-28 NOTE — Assessment & Plan Note (Signed)
Clinically, she continues to decline Even though her tumor marker was improving, I suspect it is because of repeat paracentesis She has rapidly reaccumulated malignant ascites on exam Her nutritional status continues to decline I am not hopeful that we do not see much better improvement on her CT imaging next week I encouraged her daughter to be present for next week's meeting so that we can have further discussion about plan of care Due to significant weight loss, I reduce the dose of chemotherapy today

## 2020-11-29 ENCOUNTER — Telehealth: Payer: Self-pay

## 2020-11-29 DIAGNOSIS — K219 Gastro-esophageal reflux disease without esophagitis: Secondary | ICD-10-CM | POA: Diagnosis present

## 2020-11-29 DIAGNOSIS — R112 Nausea with vomiting, unspecified: Secondary | ICD-10-CM | POA: Diagnosis not present

## 2020-11-29 DIAGNOSIS — I480 Paroxysmal atrial fibrillation: Secondary | ICD-10-CM | POA: Diagnosis present

## 2020-11-29 DIAGNOSIS — Z96611 Presence of right artificial shoulder joint: Secondary | ICD-10-CM | POA: Diagnosis present

## 2020-11-29 DIAGNOSIS — R180 Malignant ascites: Secondary | ICD-10-CM | POA: Diagnosis not present

## 2020-11-29 DIAGNOSIS — Z9049 Acquired absence of other specified parts of digestive tract: Secondary | ICD-10-CM | POA: Diagnosis not present

## 2020-11-29 DIAGNOSIS — Z79899 Other long term (current) drug therapy: Secondary | ICD-10-CM | POA: Diagnosis not present

## 2020-11-29 DIAGNOSIS — E871 Hypo-osmolality and hyponatremia: Secondary | ICD-10-CM | POA: Diagnosis not present

## 2020-11-29 DIAGNOSIS — R1011 Right upper quadrant pain: Secondary | ICD-10-CM | POA: Diagnosis not present

## 2020-11-29 DIAGNOSIS — Z881 Allergy status to other antibiotic agents status: Secondary | ICD-10-CM | POA: Diagnosis not present

## 2020-11-29 DIAGNOSIS — Z7982 Long term (current) use of aspirin: Secondary | ICD-10-CM | POA: Diagnosis not present

## 2020-11-29 DIAGNOSIS — C569 Malignant neoplasm of unspecified ovary: Secondary | ICD-10-CM | POA: Diagnosis not present

## 2020-11-29 DIAGNOSIS — R188 Other ascites: Secondary | ICD-10-CM | POA: Diagnosis not present

## 2020-11-29 DIAGNOSIS — D630 Anemia in neoplastic disease: Secondary | ICD-10-CM | POA: Diagnosis present

## 2020-11-29 DIAGNOSIS — Z885 Allergy status to narcotic agent status: Secondary | ICD-10-CM | POA: Diagnosis not present

## 2020-11-29 DIAGNOSIS — R7989 Other specified abnormal findings of blood chemistry: Secondary | ICD-10-CM | POA: Diagnosis not present

## 2020-11-29 DIAGNOSIS — K7290 Hepatic failure, unspecified without coma: Secondary | ICD-10-CM | POA: Diagnosis present

## 2020-11-29 DIAGNOSIS — M1990 Unspecified osteoarthritis, unspecified site: Secondary | ICD-10-CM | POA: Diagnosis present

## 2020-11-29 DIAGNOSIS — G43909 Migraine, unspecified, not intractable, without status migrainosus: Secondary | ICD-10-CM | POA: Diagnosis present

## 2020-11-29 DIAGNOSIS — Z888 Allergy status to other drugs, medicaments and biological substances status: Secondary | ICD-10-CM | POA: Diagnosis not present

## 2020-11-29 DIAGNOSIS — K56609 Unspecified intestinal obstruction, unspecified as to partial versus complete obstruction: Secondary | ICD-10-CM | POA: Diagnosis present

## 2020-11-29 DIAGNOSIS — Z91048 Other nonmedicinal substance allergy status: Secondary | ICD-10-CM | POA: Diagnosis not present

## 2020-11-29 DIAGNOSIS — Z9221 Personal history of antineoplastic chemotherapy: Secondary | ICD-10-CM | POA: Diagnosis not present

## 2020-11-29 DIAGNOSIS — H409 Unspecified glaucoma: Secondary | ICD-10-CM | POA: Diagnosis present

## 2020-11-29 DIAGNOSIS — Z791 Long term (current) use of non-steroidal anti-inflammatories (NSAID): Secondary | ICD-10-CM | POA: Diagnosis not present

## 2020-11-29 DIAGNOSIS — K439 Ventral hernia without obstruction or gangrene: Secondary | ICD-10-CM | POA: Diagnosis present

## 2020-11-29 DIAGNOSIS — Z823 Family history of stroke: Secondary | ICD-10-CM | POA: Diagnosis not present

## 2020-11-29 LAB — CA 125: Cancer Antigen (CA) 125: 265 U/mL — ABNORMAL HIGH (ref 0.0–38.1)

## 2020-11-29 MED ORDER — PROCHLORPERAZINE MALEATE 10 MG PO TABS
ORAL_TABLET | ORAL | Status: AC
  Filled 2020-11-29: qty 1

## 2020-11-29 NOTE — Telephone Encounter (Signed)
Attempted to call and follow up on after hours call to the answering service. No answer and voice mail will not pick up.

## 2020-12-02 ENCOUNTER — Other Ambulatory Visit (HOSPITAL_COMMUNITY): Payer: Self-pay

## 2020-12-02 ENCOUNTER — Emergency Department (HOSPITAL_COMMUNITY)
Admission: EM | Admit: 2020-12-02 | Discharge: 2020-12-02 | Disposition: A | Discharge status: Home / Self Care | Payer: Medicare Other | Attending: Emergency Medicine | Admitting: Emergency Medicine

## 2020-12-02 ENCOUNTER — Encounter: Payer: Self-pay | Admitting: Oncology

## 2020-12-02 ENCOUNTER — Encounter: Payer: Self-pay | Admitting: Hematology and Oncology

## 2020-12-02 ENCOUNTER — Telehealth: Payer: Self-pay | Admitting: Oncology

## 2020-12-02 ENCOUNTER — Ambulatory Visit
Admission: RE | Admit: 2020-12-02 | Discharge: 2020-12-02 | Disposition: A | Discharge status: Home / Self Care | Payer: Self-pay | Source: Ambulatory Visit | Attending: Hematology and Oncology | Admitting: Hematology and Oncology

## 2020-12-02 ENCOUNTER — Encounter (HOSPITAL_COMMUNITY): Payer: Self-pay

## 2020-12-02 ENCOUNTER — Telehealth: Payer: Self-pay

## 2020-12-02 ENCOUNTER — Other Ambulatory Visit: Payer: Self-pay

## 2020-12-02 DIAGNOSIS — C569 Malignant neoplasm of unspecified ovary: Secondary | ICD-10-CM | POA: Diagnosis not present

## 2020-12-02 DIAGNOSIS — Z87891 Personal history of nicotine dependence: Secondary | ICD-10-CM | POA: Insufficient documentation

## 2020-12-02 DIAGNOSIS — E860 Dehydration: Secondary | ICD-10-CM | POA: Diagnosis not present

## 2020-12-02 DIAGNOSIS — Z20822 Contact with and (suspected) exposure to covid-19: Secondary | ICD-10-CM | POA: Diagnosis not present

## 2020-12-02 DIAGNOSIS — Z96611 Presence of right artificial shoulder joint: Secondary | ICD-10-CM | POA: Insufficient documentation

## 2020-12-02 DIAGNOSIS — Z96641 Presence of right artificial hip joint: Secondary | ICD-10-CM | POA: Insufficient documentation

## 2020-12-02 DIAGNOSIS — N183 Chronic kidney disease, stage 3 unspecified: Secondary | ICD-10-CM | POA: Diagnosis not present

## 2020-12-02 DIAGNOSIS — R112 Nausea with vomiting, unspecified: Secondary | ICD-10-CM | POA: Diagnosis not present

## 2020-12-02 LAB — CBC WITH DIFFERENTIAL/PLATELET
Abs Immature Granulocytes: 0.05 10*3/uL (ref 0.00–0.07)
Basophils Absolute: 0 10*3/uL (ref 0.0–0.1)
Basophils Relative: 0 %
Eosinophils Absolute: 0 10*3/uL (ref 0.0–0.5)
Eosinophils Relative: 0 %
HCT: 33 % — ABNORMAL LOW (ref 36.0–46.0)
Hemoglobin: 10.7 g/dL — ABNORMAL LOW (ref 12.0–15.0)
Immature Granulocytes: 1 %
Lymphocytes Relative: 4 %
Lymphs Abs: 0.3 10*3/uL — ABNORMAL LOW (ref 0.7–4.0)
MCH: 28.8 pg (ref 26.0–34.0)
MCHC: 32.4 g/dL (ref 30.0–36.0)
MCV: 88.9 fL (ref 80.0–100.0)
Monocytes Absolute: 0.2 10*3/uL (ref 0.1–1.0)
Monocytes Relative: 3 %
Neutro Abs: 6.4 10*3/uL (ref 1.7–7.7)
Neutrophils Relative %: 92 %
Platelets: 291 10*3/uL (ref 150–400)
RBC: 3.71 MIL/uL — ABNORMAL LOW (ref 3.87–5.11)
RDW: 15.1 % (ref 11.5–15.5)
WBC: 7 10*3/uL (ref 4.0–10.5)
nRBC: 0 % (ref 0.0–0.2)

## 2020-12-02 LAB — COMPREHENSIVE METABOLIC PANEL
ALT: 51 U/L — ABNORMAL HIGH (ref 0–44)
AST: 27 U/L (ref 15–41)
Albumin: 2.5 g/dL — ABNORMAL LOW (ref 3.5–5.0)
Alkaline Phosphatase: 233 U/L — ABNORMAL HIGH (ref 38–126)
Anion gap: 9 (ref 5–15)
BUN: 44 mg/dL — ABNORMAL HIGH (ref 8–23)
CO2: 27 mmol/L (ref 22–32)
Calcium: 8 mg/dL — ABNORMAL LOW (ref 8.9–10.3)
Chloride: 95 mmol/L — ABNORMAL LOW (ref 98–111)
Creatinine, Ser: 1.07 mg/dL — ABNORMAL HIGH (ref 0.44–1.00)
GFR, Estimated: 52 mL/min — ABNORMAL LOW (ref >60)
Glucose, Bld: 132 mg/dL — ABNORMAL HIGH (ref 70–99)
Potassium: 3.5 mmol/L (ref 3.5–5.1)
Sodium: 131 mmol/L — ABNORMAL LOW (ref 135–145)
Total Bilirubin: 0.5 mg/dL (ref 0.3–1.2)
Total Protein: 5.3 g/dL — ABNORMAL LOW (ref 6.5–8.1)

## 2020-12-02 LAB — URINALYSIS, ROUTINE W REFLEX MICROSCOPIC
Bacteria, UA: NONE SEEN
Bilirubin Urine: NEGATIVE
Glucose, UA: NEGATIVE mg/dL
Ketones, ur: NEGATIVE mg/dL
Nitrite: NEGATIVE
Protein, ur: NEGATIVE mg/dL
Specific Gravity, Urine: 1.02 (ref 1.005–1.030)
pH: 5 (ref 5.0–8.0)

## 2020-12-02 LAB — LIPASE, BLOOD: Lipase: 22 U/L (ref 11–51)

## 2020-12-02 MED ORDER — SODIUM CHLORIDE 0.9 % IV SOLN
12.5000 mg | Freq: Four times a day (QID) | INTRAVENOUS | Status: DC | PRN
  Administered 2020-12-02: 12.5 mg via INTRAVENOUS
  Filled 2020-12-02: qty 12.5

## 2020-12-02 MED ORDER — ENSURE ENLIVE PO LIQD
237.0000 mL | Freq: Once | ORAL | Status: AC
  Administered 2020-12-02: 237 mL via ORAL
  Filled 2020-12-02: qty 237

## 2020-12-02 MED ORDER — DEXAMETHASONE 4 MG PO TABS
8.0000 mg | ORAL_TABLET | Freq: Two times a day (BID) | ORAL | 0 refills | Status: DC
Start: 2020-12-02 — End: 2020-12-10
  Filled 2020-12-02: qty 30, 8d supply, fill #0

## 2020-12-02 MED ORDER — SODIUM CHLORIDE 0.9 % IV BOLUS
1000.0000 mL | Freq: Once | INTRAVENOUS | Status: AC
  Administered 2020-12-02: 1000 mL via INTRAVENOUS

## 2020-12-02 NOTE — Telephone Encounter (Signed)
Below message given to Dr. Alvy Bimler. Called back and spoke with husband. Okay to take dexamethasone Rx, Rx sent. Reminded him dexamethasone can interfere with sleep. X 2 daughters can come to appt on Friday. He verbalized understanding.

## 2020-12-02 NOTE — ED Provider Notes (Signed)
[name] Provider Note   CSN: [id number] Arrival date & time: 12/02/20  2040     History No chief complaint on file.   [name] is a 80 y.o. female.  Pt presents to the ED today with n/v.  The pt has a hx of ovarian cancer.  She was admitted to [facility] from 6/2-6/4 for a sbo from a henia and large ascites.  She had a paracentesis which allowed the hernia to reduce and pain was better.  Surgery was consulted, but did not feel she needed surgery as sx improved.  Pt d/c with phenergan.  Pt also has zofran.  Neither are helping.  Pt unable to keep down any fluids. Pt denies any pain.  Pt's husband is concerned that she has no appetite.  She was started on Mirtazapine a few days ago to help stimulate her appetite and deal with depression.        Past Medical History:  Diagnosis Date  . Arthritis    right shoulder replacement, right replacement   . Atrial fibrillation ([place])   . Cancer ([place])   . Chronic pancreatitis ([place])   . Constipation   . Family history of Hodgkin's lymphoma   . Osteopenia   . Pancreatitis 1990s  . Syncope 03/2011    Patient Active Problem List   Diagnosis Date Noted  . Elevated liver enzymes 11/28/2020  . Insomnia disorder 11/19/2020  . Family history of Hodgkin's lymphoma   . Pancytopenia, acquired ([place]) 11/04/2020  . Nausea with vomiting 10/22/2020  . Malignant cachexia ([place]) 10/14/2020  . Mild depression ([place]) 10/14/2020  . Leg swelling 10/14/2020  . Anemia due to chronic illness 10/07/2020  . Severe protein-calorie malnutrition ([place]) 10/07/2020  . Physical debility 09/25/2020  . Lower back pain 09/18/2020  . Goals of care, counseling/discussion 09/18/2020  . Abdominal pain 09/09/2020  . Intractable nausea and vomiting 09/09/2020  . Elevated LFTs 09/09/2020  . Nasal congestion 09/02/2020  . Anorexia 09/02/2020  . Generalized weakness 09/02/2020  . Weight loss, abnormal 07/31/2020  . Ovarian ca ([place])  07/30/2020  . Pelvic mass 07/26/2020  . Malignant neoplasm of other specified female genital organs ([place])  07/26/2020  . Chronic kidney disease, stage 3 unspecified ([place]) 07/24/2020  . Allergic rhinitis 07/24/2020  . Amnesia 07/24/2020  . Malignant ascites 07/24/2020  . Chronic sinusitis 07/24/2020  . Constipation 07/24/2020  . Eczema 07/24/2020  . Hearing loss 07/24/2020  . History of pancreatitis 07/24/2020  . Iron deficiency 07/24/2020  . Knee pain 07/24/2020  . Nocturia 07/24/2020  . Osteopenia 07/24/2020  . Polymyalgia rheumatica ([place]) 07/24/2020  . Pure hypercholesterolemia 07/24/2020  . Rosacea 07/24/2020  . Sacroiliitis, not elsewhere classified ([place]) 07/24/2020  . Sleep disturbance 07/24/2020  . Spontaneous ecchymosis 07/24/2020  . Vitamin D deficiency 07/24/2020  . Prolapsed internal hemorrhoids 11/04/2017  . Primary osteoarthritis of both shoulders 07/09/2016  . Status post replacement of right shoulder joint 07/09/2016  . Primary osteoarthritis of one hip, right 12/25/2014  . Dermatochalasis 07/22/2012  . Atrial fibrillation ([place]) 09/17/2011  . Hyperlipidemia 09/17/2011    Past Surgical History:  Procedure Laterality Date  . APPENDECTOMY  1955  . CHOLECYSTECTOMY  1998  . CYSTECTOMY  1981   [facility]  . IR PARACENTESIS  11/19/2020  . JOINT REPLACEMENT     right shoulder 2008; right hip 2016  . [place]  . TOTAL HIP ARTHROPLASTY Right 11/2014   [facility], [place], [place]  OB History    Gravida  2   Para  2   Term      Preterm      AB      Living        SAB      IAB      Ectopic      Multiple      Live Births              Family History  Problem Relation Age of Onset  . Dementia Mother   . Memory loss Brother   . Hodgkin's lymphoma Father   . Kidney failure Brother     Social History   Tobacco Use  . Smoking status: Former Smoker    Packs/day: 2.50    Types: Cigarettes    Quit date: 1980     Years since quitting: 42.4  . Smokeless tobacco: Never Used  Vaping Use  . Vaping Use: Never used  Substance Use Topics  . Alcohol use: Not Currently  . Drug use: Never    Home Medications Prior to Admission medications   Medication Sig Start Date End Date Taking? Authorizing Provider  dexamethasone (DECADRON) 4 MG tablet Take 2 tablets (8 mg total) by mouth 2 (two) times daily with a meal. 12/02/20   [name], MD  Ensure (ENSURE) 220ml 09/14/20   [provider]  magnesium oxide (MAG-OX) 400 (240 Mg) MG tablet Take by mouth.    [provider]  mirtazapine (REMERON) 15 MG tablet Take 15 mg by mouth at bedtime.    [provider]  ondansetron (ZOFRAN) 8 MG tablet TAKE 1 TABLET BY MOUTH EVERY 8 HOURS AS NEEDED FOR REFRACTORY NAUSEA AND/OR VOMITING. START ON DAY 3 AFTER CARBOPLATIN CHEMO 11/14/20 11/14/21  [name], MD  polyethylene glycol (MIRALAX / GLYCOLAX) 17 g packet Take 17 g by mouth daily as needed for severe constipation. 09/14/20   Antonieta Pert, MD  prochlorperazine (COMPAZINE) 10 MG tablet Take 1 tablet (10 mg total) by mouth every 6 (six) hours as needed (Nausea or vomiting). 6/76/72   Delora Fuel, MD  prochlorperazine (COMPAZINE) 10 MG tablet 1 tablet as needed 09/14/20   [provider]  Prochlorperazine Maleate (COMPAZINE PO)     [provider]    Allergies    Cephalexin, Hydrocodone-guaifenesin, Other, Tape, and Tramadol hcl  Review of Systems   Review of Systems  Gastrointestinal: Positive for nausea and vomiting.  All other systems reviewed and are negative.   Physical Exam Updated Vital Signs BP (!) 153/77   Pulse 71   Temp 97.6 F (36.4 C) (Oral)   Resp 19   SpO2 98%   Physical Exam Vitals and nursing note reviewed.  Constitutional:      Appearance: Normal appearance.  HENT:     Head: Normocephalic and atraumatic.     Right Ear: External ear normal.     Left Ear: External ear normal.     Nose: Nose normal.      Mouth/Throat:     Mouth: Mucous membranes are dry.  Eyes:     Extraocular Movements: Extraocular movements intact.     Conjunctiva/sclera: Conjunctivae normal.     Pupils: Pupils are equal, round, and reactive to light.  Cardiovascular:     Rate and Rhythm: Normal rate and regular rhythm.     Pulses: Normal pulses.     Heart sounds: Normal heart sounds.  Pulmonary:     Effort: Pulmonary effort is normal.  Breath sounds: Normal breath sounds.  Abdominal:     General: There is distension.     Palpations: Abdomen is soft.     Tenderness: There is no abdominal tenderness.  Musculoskeletal:        General: Normal range of motion.     Cervical back: Normal range of motion and neck supple.  Skin:    General: Skin is warm.     Capillary Refill: Capillary refill takes less than 2 seconds.  Neurological:     General: No focal deficit present.     Mental Status: She is alert and oriented to person, place, and time.  Psychiatric:        Mood and Affect: Mood normal.        Behavior: Behavior normal.        Thought Content: Thought content normal.        Judgment: Judgment normal.     ED Results / Procedures / Treatments   Labs (all labs ordered are listed, but only abnormal results are displayed) Labs Reviewed  CBC WITH DIFFERENTIAL/PLATELET - Abnormal; Notable for the following components:      Result Value   RBC 3.71 (*)    Hemoglobin 10.7 (*)    HCT 33.0 (*)    Lymphs Abs 0.3 (*)    All other components within normal limits  COMPREHENSIVE METABOLIC PANEL - Abnormal; Notable for the following components:   Sodium 131 (*)    Chloride 95 (*)    Glucose, Bld 132 (*)    BUN 44 (*)    Creatinine, Ser 1.07 (*)    Calcium 8.0 (*)    Total Protein 5.3 (*)    Albumin 2.5 (*)    ALT 51 (*)    Alkaline Phosphatase 233 (*)    GFR, Estimated 52 (*)    All other components within normal limits  RESP PANEL BY RT-PCR (FLU A&B, COVID) ARPGX2  LIPASE, BLOOD  URINALYSIS,  ROUTINE W REFLEX MICROSCOPIC    EKG None  Radiology No results found.  Procedures Procedures   Medications Ordered in ED Medications  promethazine (PHENERGAN) 12.5 mg in sodium chloride 0.9 % 50 mL IVPB (0 mg Intravenous Stopped 12/02/20 2325)  sodium chloride 0.9 % bolus 1,000 mL (0 mLs Intravenous Stopped 12/02/20 2325)  feeding supplement (ENSURE ENLIVE / ENSURE PLUS) liquid 237 mL (237 mLs Oral Given 12/02/20 2323)    ED Course  I have reviewed the triage vital signs and the nursing notes.  Pertinent labs & imaging results that were available during my care of the patient were reviewed by me and considered in my medical decision making (see chart for details).    MDM Rules/Calculators/A&P                          Pt is feeling much better after the fluids and phenergan.  She ate a whole bottle of Ensure and had some crackers.  She is offered admission, but opts to go home.  She has no abdominal pain and the nausea is gone, so I don't think pt needs a CT abd/pelvis.  Pt is stable for d/c.  Return if worse. Final Clinical Impression(s) / ED Diagnoses Final diagnoses:  Non-intractable vomiting with nausea, unspecified vomiting type  Dehydration  Malignant neoplasm of ovary, unspecified laterality [facility])    Rx / DC Orders ED Discharge Orders    None       Isla Pence, MD 12/02/20  2346  

## 2020-12-02 NOTE — ED Triage Notes (Signed)
Patient arrives via EMS from home with complaint of N/V x 3-4 days. Pt reports emesis is brown/gray in color. Hx of ovarian cancer, last tx June 2nd. Pt also reports hernia reduction done earlier this week. Procedure was done outpatient, pt returned home same day. RLQ tenderness, 20 g LH, 4 mg Zofran given en route.   EMS vitals: BP159/90 HR 82 SPO2 96% RR 16

## 2020-12-02 NOTE — Telephone Encounter (Signed)
[name] called and left a message to call him.  Called back. [name] went to [facility] and was admitted for the chest pain, she was discharged yesterday. She has a large abdominal hernia that was causing the pain. She had a paracentesis while admitted and this relieved her pain. She was discharged yesterday and after getting home she was having nausea. Zofran and Compazine did not provide relief. The after hours nurse said that she could take Dexamethasone 8 mg BID. Asking if it is okay to take the dexamethasone Rx and will need Rx.

## 2020-12-02 NOTE — Telephone Encounter (Signed)
Called [name] and spoke to [name].  Advised him that Dr. Alvy Bimler was able to view the CT scan done at [facility] on 11/28/20 and that [name] does not need to have the CT scheduled for 12/05/20.  Let [name] know that I will cancel the appointment for the CT and reviewed that they should still keep the appointment with Dr. Alvy Bimler on 12/06/20 at 2:20.  He verbalized understanding and agreement.

## 2020-12-03 ENCOUNTER — Ambulatory Visit: Payer: Self-pay | Admitting: Licensed Clinical Social Worker

## 2020-12-03 ENCOUNTER — Encounter: Payer: Self-pay | Admitting: Licensed Clinical Social Worker

## 2020-12-03 ENCOUNTER — Telehealth: Payer: Self-pay | Admitting: Licensed Clinical Social Worker

## 2020-12-03 ENCOUNTER — Telehealth: Payer: Self-pay

## 2020-12-03 DIAGNOSIS — Z1379 Encounter for other screening for genetic and chromosomal anomalies: Secondary | ICD-10-CM

## 2020-12-03 DIAGNOSIS — Z807 Family history of other malignant neoplasms of lymphoid, hematopoietic and related tissues: Secondary | ICD-10-CM

## 2020-12-03 DIAGNOSIS — C569 Malignant neoplasm of unspecified ovary: Secondary | ICD-10-CM

## 2020-12-03 LAB — RESP PANEL BY RT-PCR (FLU A&B, COVID) ARPGX2
Influenza A by PCR: NEGATIVE
Influenza B by PCR: NEGATIVE
SARS Coronavirus 2 by RT PCR: NEGATIVE

## 2020-12-03 NOTE — Telephone Encounter (Signed)
Revealed negative genetic testing.  Revealed that a VUS in AXIN2 was identified. This normal result is reassuring and indicates that it is unlikely [name]'s cancer is due to a hereditary cause.  It is unlikely that there is an increased risk of another cancer due to a mutation in one of these genes.  However, genetic testing is not perfect, and cannot definitively rule out a hereditary cause.  It will be important for her to keep in contact with genetics to learn if any additional testing may be needed in the future.   Additionally, we are still waiting for HRD testing to come back. We will call her again with those results, expected to come back by next week.

## 2020-12-03 NOTE — Progress Notes (Signed)
HPI:  Ms. Padia was previously seen in the [place] clinic due to a personal and family history of cancer and concerns regarding a hereditary predisposition to cancer. Please refer to our prior cancer genetics clinic note for more information regarding our discussion, assessment and recommendations, at the time. Ms. Dominski recent genetic test results were disclosed to her, as were recommendations warranted by these results. These results and recommendations are discussed in more detail below.  CANCER HISTORY:  Oncology History  Ovarian ca ([place])  07/22/2020 Imaging   Ct abdomen and pelvis 1. Large calcified mass in the pelvis that is new from 2010. There is associated large volume ascites with mild peritoneal nodularity/calcification, primarily worrisome for a calcifying ovarian malignancy. 2. Small right pleural effusion with few calcifications in the anterior juxta diaphragmatic space, possibly related to #1. 3. Nonenlarged but calcified periaortic lymph nodes which are also worrisome for spread of disease.   07/29/2020 Procedure   Successful ultrasound-guided diagnostic and therapeutic paracentesis yielding 5 liters of peritoneal fluid.   07/29/2020 Tumor Marker   Patient's tumor was tested for the following markers: CA-125 Results of the tumor marker test revealed 771   07/30/2020 Initial Diagnosis   Ovarian ca ([place])   07/31/2020 Imaging   1. Calcified periesophageal and juxtadiaphragmatic nodules are indicative of metastatic disease. 2. Small right and trace left pleural effusions. 3. Ascites and calcified upper abdominal adenopathy/peritoneal nodules, better assessed on 07/22/2020. 4. Aortic atherosclerosis (ICD10-I70.0). Coronary artery calcification.   07/31/2020 Cancer Staging   Staging form: Ovary, Fallopian Tube, and Primary Peritoneal Carcinoma, AJCC 8th Edition - Clinical stage from 07/31/2020: Stage IV (cT3, cN1, cM1) - Signed by [name], MD on  07/31/2020 Stage prefix: Initial diagnosis   08/01/2020 Tumor Marker   Patient's tumor was tested for the following markers: CA-125 Results of the tumor marker test revealed 641   08/05/2020 - 08/26/2020 Chemotherapy         08/26/2020 Tumor Marker   Patient's tumor was tested for the following markers: CA-125 Results of the tumor marker test revealed 663.   09/03/2020 Procedure   Successful ultrasound-guided therapeutic paracentesis yielding 5 liters of peritoneal fluid.   09/09/2020 - 09/14/2020 Hospital Admission   She was admitted to the hospital for management of subacute bowel obstruction   09/10/2020 Imaging   A total of approximately 2.9 L of clear yellow fluid was removed.   09/11/2020 Imaging   1. Small bowel obstruction with a transition zone seen within the posterolateral aspect of the left lower quadrant. 2. Large, stable calcified pelvic mass consistent with the patient's known history of ovarian cancer. 3. Mild-to-moderate amount of free fluid within the abdomen and pelvis. 4. Stable hepatic and left renal cysts. 5. Total right hip replacement. 6. Approximately 5 mm anterolisthesis of the L5 vertebral body on S1.   09/13/2020 Imaging   B/L venous Doppler US Right:  No evidence of deep vein thrombosis in the upper extremity. Findings consistent with acute superficial vein thrombosis involving the right forearm portion of the basilic vein.     Left:  No evidence of thrombosis in the subclavian.   10/07/2020 -  Chemotherapy    Patient is on Treatment Plan: OVARIAN GEMCITABINE D1,8 Q21D      10/07/2020 Tumor Marker   Patient's tumor was tested for the following markers: CA-125. Results of the tumor marker test revealed 544.   10/22/2020 Procedure   Successful ultrasound-guided therapeutic paracentesis yielding 5 liters of peritoneal fluid.  11/04/2020 Tumor Marker   Patient's tumor was tested for the following markers: CA-125 Results of the tumor marker test revealed  306   11/19/2020 Procedure   Successful ultrasound-guided paracentesis yielding 5 liters of peritoneal fluid.   11/28/2020 Tumor Marker   Patient's tumor was tested for the following markers: CA-125 Results of the tumor marker test revealed 265    Genetic Testing   Negative germline genetic testing. No pathogenic variants identified on the [facility] CancerNext-Expanded+RNA panel. 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 The report date is 12/02/2020. We are still waiting on [facility]D results.   The CancerNext-Expanded + RNAinsight gene panel offered by [employer] and includes sequencing and rearrangement analysis for the following 77 genes: IP, ALK, APC*, ATM*, AXIN2, BAP1, BARD1, BLM, BMPR1A, BRCA1*, BRCA2*, BRIP1*, CDC73, CDH1*,CDK4, CDKN1B, CDKN2A, CHEK2*, CTNNA1, DICER1, FANCC, FH, FLCN, GALNT12, KIF1B, LZTR1, MAX, MEN1, MET, MLH1*, MSH2*, MSH3, MSH6*, MUTYH*, NBN, NF1*, NF2, NTHL1, PALB2*, PHOX2B, PMS2*, POT1, PRKAR1A, PTCH1, PTEN*, RAD51C*, RAD51D*,RB1, RECQL, RET, SDHA, SDHAF2, SDHB, SDHC, SDHD, SMAD4, SMARCA4, SMARCB1, SMARCE1, STK11, SUFU, TMEM127, TP53*,TSC1, TSC2, VHL and XRCC2 (sequencing and deletion/duplication); EGFR, EGLN1, HOXB13, KIT, MITF, PDGFRA, POLD1 and POLE (sequencing only); EPCAM and GREM1 (deletion/duplication only).     FAMILY HISTORY:  We obtained a detailed, 4-generation family history.  Significant diagnoses are listed below: Family History  Problem Relation Age of Onset  . Dementia Mother   . Memory loss Brother   . Hodgkin's lymphoma Father   . Kidney failure Brother     Ms. Chuba has 2 daughters, Erline Levine and [name]. She had 3 brothers and 2 sisters, none have had cancer.  Ms. Lynne father had Hodgkin lymphoma an died at 10. Patient had 3 paternal aunts, 4 paternal uncles, no cancers for them or for cousins. No cancer for paternal grandparents.  Ms. Glantz mother died at 60, no cancers. Patient had 2 maternal uncles, 3 maternal aunts, no cancers. No  cousins with cancer, and maternal grandparents did not have cancer.  Ms. Scholten is unaware of previous family history of genetic testing for hereditary cancer risks. Patient's maternal ancestors are of [place] descent, and paternal ancestors are of [place] descent. There is no reported Ashkenazi Jewish ancestry. There is no known consanguinity.    GENETIC TEST RESULTS: Genetic testing reported out on 12/02/2020 through the [facility] CancerNext-Expanded+RNA cancer panel found no pathogenic mutations.   The CancerNext-Expanded + RNAinsight gene panel offered by [employer] and includes sequencing and rearrangement analysis for the following 77 genes: IP, ALK, APC*, ATM*, AXIN2, BAP1, BARD1, BLM, BMPR1A, BRCA1*, BRCA2*, BRIP1*, CDC73, CDH1*,CDK4, CDKN1B, CDKN2A, CHEK2*, CTNNA1, DICER1, FANCC, FH, FLCN, GALNT12, KIF1B, LZTR1, MAX, MEN1, MET, MLH1*, MSH2*, MSH3, MSH6*, MUTYH*, NBN, NF1*, NF2, NTHL1, PALB2*, PHOX2B, PMS2*, POT1, PRKAR1A, PTCH1, PTEN*, RAD51C*, RAD51D*,RB1, RECQL, RET, SDHA, SDHAF2, SDHB, SDHC, SDHD, SMAD4, SMARCA4, SMARCB1, SMARCE1, STK11, SUFU, TMEM127, TP53*,TSC1, TSC2, VHL and XRCC2 (sequencing and deletion/duplication); EGFR, EGLN1, HOXB13, KIT, MITF, PDGFRA, POLD1 and POLE (sequencing only); EPCAM and GREM1 (deletion/duplication only).   The test report has been scanned into EPIC and is located under the Molecular Pathology section of the Results Review tab.  A portion of the result report is included below for reference.     We discussed with Ms. Cirelli that because current genetic testing is not perfect, it is possible there may be a gene mutation in one of these genes that current testing cannot detect, but that chance is small.  We also discussed, that there could be another gene  that has not yet been discovered, or that we have not yet tested, that is responsible for the cancer diagnoses in the family. It is also possible there is a hereditary cause for the cancer in the family that  Ms. Kakos did not inherit and therefore was not identified in her testing.  Therefore, it is important to remain in touch with cancer genetics in the future so that we can continue to offer Ms. Sidell the most up to date genetic testing.   Genetic testing did identify a variant of uncertain significance (VUS) in the AXIN2 gene called c.483C>G.  At this time, it is unknown if this variant is associated with increased cancer risk or if this is a normal finding, but most variants such as this get reclassified to being inconsequential. It should not be used to make medical management decisions. With time, we suspect the lab will determine the significance of this variant, if any. If we do learn more about it, we will try to contact Ms. Molinaro to discuss it further. However, it is important to stay in touch with [place] periodically and keep the address and phone number up to date.  ADDITIONAL GENETIC TESTING: We discussed with Ms. Waltermire that her genetic testing was fairly extensive.  If there are genes identified to increase cancer risk that can be analyzed in the future, we would be happy to discuss and coordinate this testing at that time.    CANCER SCREENING RECOMMENDATIONS: Ms. Branagan test result is considered negative (normal).  This means that we have not identified a hereditary cause for her  personal and family history of cancer at this time. Most cancers happen by chance and this negative test suggests that her cancer may fall into this category.    While reassuring, this does not definitively rule out a hereditary predisposition to cancer. It is still possible that there could be genetic mutations that are undetectable by current technology. There could be genetic mutations in genes that have not been tested or identified to increase cancer risk.  Therefore, it is recommended she continue to follow the cancer management and screening guidelines provided by her oncology and primary healthcare  provider.   An individual's cancer risk and medical management are not determined by genetic test results alone. Overall cancer risk assessment incorporates additional factors, including personal medical history, family history, and any available genetic information that may result in a personalized plan for cancer prevention and surveillance.  RECOMMENDATIONS FOR FAMILY MEMBERS:  Relatives in this family might be at some increased risk of developing cancer, over the general population risk, simply due to the family history of cancer.  We recommended female relatives in this family have a yearly mammogram beginning at age 44, or 73 years younger than the earliest onset of cancer, an annual clinical breast exam, and perform monthly breast self-exams. Female relatives in this family should also have a gynecological exam as recommended by their primary provider.  All family members should be referred for colonoscopy starting at age 56.   FOLLOW-UP: Lastly, we discussed with Ms. Doffing that cancer genetics is a rapidly advancing field and it is possible that new genetic tests will be appropriate for her and/or her family members in the future. We encouraged her to remain in contact with cancer genetics on an annual basis so we can update her personal and family histories and let her know of advances in cancer genetics that may benefit this family.   Our contact number was  provided. Ms. Heacox questions were answered to her satisfaction, and she knows she is welcome to call us at anytime with additional questions or concerns.   Faith Rogue, MS, [facility] Genetic Counselor [email or web address] Phone: [phone]

## 2020-12-03 NOTE — Telephone Encounter (Signed)
Husband called and left a message to call.  Called back. [name] went to the ER last night for n/v and dehydration. She got IV fluids. She is still complaining of nausea and does not want to eat. She is taking the dexamethasone BID. She has not been taking Zofran or Compazine. Instructed to take Compazine q 6 hours and Zofran every 8 hours on a schedule, alternating the medication. He verbalized understanding. [name] got a Rx for phenergan at [facility] Sunday. They never filled the Rx. Instructed husband to fill the Phenergan Rx and start taking per the instruction. He is not sure of he will pick it up because is does not think that it helps. Instructed to go back to the ER to be evaluated. [name] said, "[name] will take the Dexamethasone, Zofran and Compazine and see how she feels". Instructed to call the office back if needed. He verbalized understanding.  Above message given to Dr. Alvy Bimler.

## 2020-12-05 ENCOUNTER — Telehealth: Payer: Self-pay

## 2020-12-05 ENCOUNTER — Ambulatory Visit (HOSPITAL_BASED_OUTPATIENT_CLINIC_OR_DEPARTMENT_OTHER): Payer: Medicare Other

## 2020-12-05 NOTE — Telephone Encounter (Signed)
Husband called and left a message to call him.  Called back. He is concerned about Ethelwyn not eating and not having a appetite. He called PCP and they called him back. PCP suggested [facility] if it is a option for [employer]. Edwina Barth to ask Dr. Alvy Bimler, she would be the one to order if it is a option.

## 2020-12-06 ENCOUNTER — Encounter (HOSPITAL_COMMUNITY): Payer: Self-pay

## 2020-12-06 ENCOUNTER — Inpatient Hospital Stay (HOSPITAL_COMMUNITY)
Admission: EM | Admit: 2020-12-06 | Discharge: 2020-12-10 | DRG: 754 | Disposition: A | Discharge status: Hospice | Payer: Medicare Other | Attending: Internal Medicine | Admitting: Internal Medicine

## 2020-12-06 ENCOUNTER — Emergency Department (HOSPITAL_COMMUNITY): Payer: Medicare Other

## 2020-12-06 ENCOUNTER — Inpatient Hospital Stay (HOSPITAL_COMMUNITY): Payer: Medicare Other

## 2020-12-06 ENCOUNTER — Encounter: Payer: Self-pay | Admitting: Hematology and Oncology

## 2020-12-06 ENCOUNTER — Other Ambulatory Visit: Payer: Self-pay

## 2020-12-06 ENCOUNTER — Inpatient Hospital Stay: Payer: Medicare Other | Admitting: Hematology and Oncology

## 2020-12-06 DIAGNOSIS — Z881 Allergy status to other antibiotic agents status: Secondary | ICD-10-CM

## 2020-12-06 DIAGNOSIS — R410 Disorientation, unspecified: Secondary | ICD-10-CM | POA: Diagnosis not present

## 2020-12-06 DIAGNOSIS — C569 Malignant neoplasm of unspecified ovary: Secondary | ICD-10-CM | POA: Diagnosis present

## 2020-12-06 DIAGNOSIS — T451X5A Adverse effect of antineoplastic and immunosuppressive drugs, initial encounter: Secondary | ICD-10-CM | POA: Diagnosis present

## 2020-12-06 DIAGNOSIS — Z885 Allergy status to narcotic agent status: Secondary | ICD-10-CM

## 2020-12-06 DIAGNOSIS — Z6822 Body mass index (BMI) 22.0-22.9, adult: Secondary | ICD-10-CM | POA: Diagnosis not present

## 2020-12-06 DIAGNOSIS — R634 Abnormal weight loss: Secondary | ICD-10-CM | POA: Diagnosis not present

## 2020-12-06 DIAGNOSIS — Z20822 Contact with and (suspected) exposure to covid-19: Secondary | ICD-10-CM | POA: Diagnosis present

## 2020-12-06 DIAGNOSIS — R0689 Other abnormalities of breathing: Secondary | ICD-10-CM | POA: Diagnosis not present

## 2020-12-06 DIAGNOSIS — Z781 Physical restraint status: Secondary | ICD-10-CM | POA: Diagnosis not present

## 2020-12-06 DIAGNOSIS — R64 Cachexia: Secondary | ICD-10-CM | POA: Diagnosis present

## 2020-12-06 DIAGNOSIS — E162 Hypoglycemia, unspecified: Secondary | ICD-10-CM | POA: Diagnosis not present

## 2020-12-06 DIAGNOSIS — R109 Unspecified abdominal pain: Secondary | ICD-10-CM

## 2020-12-06 DIAGNOSIS — R159 Full incontinence of feces: Secondary | ICD-10-CM | POA: Diagnosis not present

## 2020-12-06 DIAGNOSIS — Z87891 Personal history of nicotine dependence: Secondary | ICD-10-CM | POA: Diagnosis not present

## 2020-12-06 DIAGNOSIS — N281 Cyst of kidney, acquired: Secondary | ICD-10-CM | POA: Diagnosis not present

## 2020-12-06 DIAGNOSIS — D6481 Anemia due to antineoplastic chemotherapy: Secondary | ICD-10-CM | POA: Diagnosis present

## 2020-12-06 DIAGNOSIS — R627 Adult failure to thrive: Secondary | ICD-10-CM | POA: Diagnosis present

## 2020-12-06 DIAGNOSIS — R4182 Altered mental status, unspecified: Secondary | ICD-10-CM

## 2020-12-06 DIAGNOSIS — Z96641 Presence of right artificial hip joint: Secondary | ICD-10-CM | POA: Diagnosis present

## 2020-12-06 DIAGNOSIS — Z79891 Long term (current) use of opiate analgesic: Secondary | ICD-10-CM

## 2020-12-06 DIAGNOSIS — Z96611 Presence of right artificial shoulder joint: Secondary | ICD-10-CM | POA: Diagnosis present

## 2020-12-06 DIAGNOSIS — C799 Secondary malignant neoplasm of unspecified site: Secondary | ICD-10-CM | POA: Diagnosis not present

## 2020-12-06 DIAGNOSIS — R7989 Other specified abnormal findings of blood chemistry: Secondary | ICD-10-CM | POA: Diagnosis not present

## 2020-12-06 DIAGNOSIS — Z5189 Encounter for other specified aftercare: Secondary | ICD-10-CM | POA: Diagnosis not present

## 2020-12-06 DIAGNOSIS — I959 Hypotension, unspecified: Secondary | ICD-10-CM | POA: Diagnosis not present

## 2020-12-06 DIAGNOSIS — G9341 Metabolic encephalopathy: Secondary | ICD-10-CM | POA: Diagnosis present

## 2020-12-06 DIAGNOSIS — R29810 Facial weakness: Secondary | ICD-10-CM | POA: Diagnosis not present

## 2020-12-06 DIAGNOSIS — I482 Chronic atrial fibrillation, unspecified: Secondary | ICD-10-CM | POA: Diagnosis present

## 2020-12-06 DIAGNOSIS — K439 Ventral hernia without obstruction or gangrene: Secondary | ICD-10-CM | POA: Diagnosis not present

## 2020-12-06 DIAGNOSIS — E860 Dehydration: Secondary | ICD-10-CM | POA: Diagnosis present

## 2020-12-06 DIAGNOSIS — Z79899 Other long term (current) drug therapy: Secondary | ICD-10-CM

## 2020-12-06 DIAGNOSIS — Z66 Do not resuscitate: Secondary | ICD-10-CM | POA: Diagnosis not present

## 2020-12-06 DIAGNOSIS — Z91048 Other nonmedicinal substance allergy status: Secondary | ICD-10-CM

## 2020-12-06 DIAGNOSIS — D638 Anemia in other chronic diseases classified elsewhere: Secondary | ICD-10-CM | POA: Diagnosis present

## 2020-12-06 DIAGNOSIS — R531 Weakness: Secondary | ICD-10-CM | POA: Diagnosis not present

## 2020-12-06 DIAGNOSIS — D630 Anemia in neoplastic disease: Secondary | ICD-10-CM | POA: Diagnosis not present

## 2020-12-06 DIAGNOSIS — G9340 Encephalopathy, unspecified: Secondary | ICD-10-CM | POA: Diagnosis not present

## 2020-12-06 DIAGNOSIS — E43 Unspecified severe protein-calorie malnutrition: Secondary | ICD-10-CM | POA: Diagnosis not present

## 2020-12-06 DIAGNOSIS — Z515 Encounter for palliative care: Secondary | ICD-10-CM

## 2020-12-06 DIAGNOSIS — R32 Unspecified urinary incontinence: Secondary | ICD-10-CM | POA: Diagnosis not present

## 2020-12-06 DIAGNOSIS — K3189 Other diseases of stomach and duodenum: Secondary | ICD-10-CM | POA: Diagnosis not present

## 2020-12-06 DIAGNOSIS — I6782 Cerebral ischemia: Secondary | ICD-10-CM | POA: Diagnosis not present

## 2020-12-06 DIAGNOSIS — R180 Malignant ascites: Secondary | ICD-10-CM | POA: Diagnosis not present

## 2020-12-06 DIAGNOSIS — D171 Benign lipomatous neoplasm of skin and subcutaneous tissue of trunk: Secondary | ICD-10-CM | POA: Diagnosis not present

## 2020-12-06 DIAGNOSIS — R748 Abnormal levels of other serum enzymes: Secondary | ICD-10-CM

## 2020-12-06 DIAGNOSIS — K219 Gastro-esophageal reflux disease without esophagitis: Secondary | ICD-10-CM | POA: Diagnosis present

## 2020-12-06 DIAGNOSIS — G319 Degenerative disease of nervous system, unspecified: Secondary | ICD-10-CM | POA: Diagnosis not present

## 2020-12-06 DIAGNOSIS — R9431 Abnormal electrocardiogram [ECG] [EKG]: Secondary | ICD-10-CM | POA: Diagnosis not present

## 2020-12-06 DIAGNOSIS — M2550 Pain in unspecified joint: Secondary | ICD-10-CM | POA: Diagnosis not present

## 2020-12-06 DIAGNOSIS — Z7189 Other specified counseling: Secondary | ICD-10-CM | POA: Diagnosis not present

## 2020-12-06 DIAGNOSIS — Z7401 Bed confinement status: Secondary | ICD-10-CM | POA: Diagnosis not present

## 2020-12-06 LAB — COMPREHENSIVE METABOLIC PANEL
ALT: 68 U/L — ABNORMAL HIGH (ref 0–44)
AST: 41 U/L (ref 15–41)
Albumin: 2.5 g/dL — ABNORMAL LOW (ref 3.5–5.0)
Alkaline Phosphatase: 221 U/L — ABNORMAL HIGH (ref 38–126)
Anion gap: 11 (ref 5–15)
BUN: 24 mg/dL — ABNORMAL HIGH (ref 8–23)
CO2: 27 mmol/L (ref 22–32)
Calcium: 8.2 mg/dL — ABNORMAL LOW (ref 8.9–10.3)
Chloride: 97 mmol/L — ABNORMAL LOW (ref 98–111)
Creatinine, Ser: 1.05 mg/dL — ABNORMAL HIGH (ref 0.44–1.00)
GFR, Estimated: 53 mL/min — ABNORMAL LOW (ref >60)
Glucose, Bld: 129 mg/dL — ABNORMAL HIGH (ref 70–99)
Potassium: 4.3 mmol/L (ref 3.5–5.1)
Sodium: 135 mmol/L (ref 135–145)
Total Bilirubin: 0.6 mg/dL (ref 0.3–1.2)
Total Protein: 5.1 g/dL — ABNORMAL LOW (ref 6.5–8.1)

## 2020-12-06 LAB — CBC WITH DIFFERENTIAL/PLATELET
Abs Immature Granulocytes: 0.16 10*3/uL — ABNORMAL HIGH (ref 0.00–0.07)
Basophils Absolute: 0 10*3/uL (ref 0.0–0.1)
Basophils Relative: 0 %
Eosinophils Absolute: 0 10*3/uL (ref 0.0–0.5)
Eosinophils Relative: 0 %
HCT: 35.8 % — ABNORMAL LOW (ref 36.0–46.0)
Hemoglobin: 11.7 g/dL — ABNORMAL LOW (ref 12.0–15.0)
Immature Granulocytes: 2 %
Lymphocytes Relative: 18 %
Lymphs Abs: 1.8 10*3/uL (ref 0.7–4.0)
MCH: 29.3 pg (ref 26.0–34.0)
MCHC: 32.7 g/dL (ref 30.0–36.0)
MCV: 89.5 fL (ref 80.0–100.0)
Monocytes Absolute: 1 10*3/uL (ref 0.1–1.0)
Monocytes Relative: 10 %
Neutro Abs: 6.8 10*3/uL (ref 1.7–7.7)
Neutrophils Relative %: 70 %
Platelets: 232 10*3/uL (ref 150–400)
RBC: 4 MIL/uL (ref 3.87–5.11)
RDW: 15 % (ref 11.5–15.5)
WBC: 9.8 10*3/uL (ref 4.0–10.5)
nRBC: 0 % (ref 0.0–0.2)

## 2020-12-06 LAB — URINALYSIS, ROUTINE W REFLEX MICROSCOPIC
Bacteria, UA: NONE SEEN
Bilirubin Urine: NEGATIVE
Glucose, UA: NEGATIVE mg/dL
Hgb urine dipstick: NEGATIVE
Ketones, ur: NEGATIVE mg/dL
Leukocytes,Ua: NEGATIVE
Nitrite: NEGATIVE
Protein, ur: 30 mg/dL — AB
Specific Gravity, Urine: 1.02 (ref 1.005–1.030)
pH: 8 (ref 5.0–8.0)

## 2020-12-06 LAB — RESP PANEL BY RT-PCR (FLU A&B, COVID) ARPGX2
Influenza A by PCR: NEGATIVE
Influenza B by PCR: NEGATIVE
SARS Coronavirus 2 by RT PCR: NEGATIVE

## 2020-12-06 LAB — GLUCOSE, CAPILLARY
Glucose-Capillary: 91 mg/dL (ref 70–99)
Glucose-Capillary: 74 mg/dL (ref 70–99)

## 2020-12-06 LAB — AMMONIA: Ammonia: 19 umol/L (ref 9–35)

## 2020-12-06 MED ORDER — SODIUM CHLORIDE 0.9 % IV SOLN: INTRAVENOUS | Status: DC

## 2020-12-06 MED ORDER — DEXTROSE-NACL 5-0.9 % IV SOLN: INTRAVENOUS | Status: DC

## 2020-12-06 MED ORDER — SODIUM CHLORIDE 0.9 % IV SOLN: Freq: Once | INTRAVENOUS | Status: AC

## 2020-12-06 MED ORDER — SODIUM CHLORIDE 0.9 % IV SOLN
12.5000 mg | Freq: Four times a day (QID) | INTRAVENOUS | Status: DC | PRN
  Filled 2020-12-06 (×2): qty 0.5

## 2020-12-06 MED ORDER — LORAZEPAM 2 MG/ML IJ SOLN
0.5000 mg | Freq: Once | INTRAMUSCULAR | Status: AC | PRN
  Administered 2020-12-06: 0.5 mg via INTRAVENOUS
  Filled 2020-12-06: qty 1

## 2020-12-06 MED ORDER — ENOXAPARIN SODIUM 40 MG/0.4ML IJ SOSY
40.0000 mg | PREFILLED_SYRINGE | Freq: Every day | INTRAMUSCULAR | Status: DC
  Administered 2020-12-06 – 2020-12-08 (×3): 40 mg via SUBCUTANEOUS
  Filled 2020-12-06 (×3): qty 0.4

## 2020-12-06 MED ORDER — MIRTAZAPINE 15 MG PO TABS
15.0000 mg | ORAL_TABLET | Freq: Every day | ORAL | Status: DC
  Administered 2020-12-06 – 2020-12-07 (×2): 15 mg via ORAL
  Filled 2020-12-06 (×2): qty 1

## 2020-12-06 MED ORDER — ALBUTEROL SULFATE (2.5 MG/3ML) 0.083% IN NEBU: 2.5000 mg | INHALATION_SOLUTION | Freq: Four times a day (QID) | RESPIRATORY_TRACT | Status: DC | PRN

## 2020-12-06 MED ORDER — ENSURE ENLIVE PO LIQD
237.0000 mL | Freq: Two times a day (BID) | ORAL | Status: DC
  Administered 2020-12-07 – 2020-12-09 (×6): 237 mL via ORAL

## 2020-12-06 MED ORDER — POLYETHYLENE GLYCOL 3350 17 G PO PACK: 17.0000 g | PACK | Freq: Two times a day (BID) | ORAL | Status: DC | PRN

## 2020-12-06 MED ORDER — SODIUM CHLORIDE 0.9% FLUSH
3.0000 mL | Freq: Two times a day (BID) | INTRAVENOUS | Status: DC
  Administered 2020-12-06 – 2020-12-09 (×7): 3 mL via INTRAVENOUS

## 2020-12-06 MED ORDER — LORAZEPAM 2 MG/ML IJ SOLN
1.0000 mg | Freq: Once | INTRAMUSCULAR | Status: AC
  Administered 2020-12-06: 1 mg via INTRAVENOUS
  Filled 2020-12-06: qty 1

## 2020-12-06 MED ORDER — OXYCODONE-ACETAMINOPHEN 5-325 MG PO TABS
1.0000 | ORAL_TABLET | Freq: Four times a day (QID) | ORAL | Status: DC | PRN
  Administered 2020-12-07 (×2): 1 via ORAL
  Filled 2020-12-06 (×2): qty 1

## 2020-12-06 NOTE — ED Notes (Signed)
Pt finished xray and just came back from CT at this time. Appears calm and pulling some on the restraints. ROM to bilateral wrists done. No skin redness or problems observed. Restraints readjusted. Will continue to monitor.

## 2020-12-06 NOTE — H&P (Signed)
History and Physical    [name] UUV:[id number] DOB: [birth year] DOA: 12/06/2020  Referring MD/NP/PA: Davonna Belling, MD PCP: [name], MD  Patient coming from: Home via EMS  Chief Complaint: Talking gibberish  I have personally briefly reviewed patient's old medical records in [place]   HPI: [name] is a 80 y.o. female with medical history significant of ovarian cancer on chemotherapy, atrial fibrillation, anemia, arthritis, and GERD who presented from home after being found to be acutely altered talking "gibberish".  Symptoms started this morning around 7 AM.  She was just yelling and was not making sense of what she was saying.  Patient had been declining per the husband as she had not been wanting to eat much of anything.  He reports that she is not dehydrated and she has been taking in fluids.  Records note patient had just been hospitalized at [facility] from 6/2-6/4 after presenting with abdominal pain found to have concern for small bowel obstruction on CT.  During the hospitalization patient had received a paracentesis which removed approximately 4 L of fluid with improvement in patient's abdominal pain.  However, husband reports that they had withheld food and it seems the ultimately demanded discharge as they were not doing anything to help his wife.  He reports that they were supposed to have a follow-up appointment with his oncologist soon to see if the chemotherapy was helping or not.  Husband reports that he would like her to be able to talk coherently and want to eat.  He states that he wants everything done if anything were to happen as she needs to be able to communicate to help him with these discussions.  Last night patient had also had trouble in making it to the toilet and he had to his sister.  He reports that the patient has not had a bowel movement in several days, but she has not eaten anything to do so.    ED Course: Upon admission into  the emergency department patient was seen to be afebrile with respirations 15-24, and all other vital signs maintained.  Labs significant for hemoglobin 11.7, BUN 24, creatinine 1.05, alkaline phosphatase 221, albumin 2.5, AST 41, ALT 68.  CT scan of the brain showed no acute abnormalities.  Abdominal x-ray was significant for mildly dilated loops of bowel and a large calcified mass in the pelvis.  Patient had been given 1 mg of Ativan and placed in soft restraints.  Review of Systems  Unable to perform ROS: Mental status change   Past Medical History:  Diagnosis Date   Arthritis    right shoulder replacement, right replacement    Atrial fibrillation (HCC)    Cancer (HCC)    Chronic pancreatitis ([place])    Constipation    Family history of Hodgkin's lymphoma    Osteopenia    Pancreatitis 1990s   Syncope 03/2011    Past Surgical History:  Procedure Laterality Date   [place]   IR PARACENTESIS  11/19/2020   JOINT REPLACEMENT     right shoulder 2008; right hip 2016   [place]   TOTAL HIP ARTHROPLASTY Right 11/2014   [facility], [place], [place]     reports that she quit smoking about 42 years ago. Her smoking use included cigarettes. She smoked an average of 2.50 packs per day. She has never used smokeless  tobacco. She reports previous alcohol use. She reports that she does not use drugs.  Allergies  Allergen Reactions   Cephalexin Other (See Comments)    dehydrated   Hydrocodone-Guaifenesin Other (See Comments)    Dizzy   Other Other (See Comments)    Unknown   Tape Other (See Comments)    Unknown Surgical tape   Tramadol Hcl Other (See Comments)    Constipation    Family History  Problem Relation Age of Onset   Dementia Mother    Memory loss Brother    Hodgkin's lymphoma Father    Kidney failure Brother     Prior to Admission medications   Medication Sig Start Date End Date  Taking? Authorizing Provider  dexamethasone (DECADRON) 4 MG tablet Take 2 tablets (8 mg total) by mouth 2 (two) times daily with a meal. Patient taking differently: Take 8 mg by mouth 2 (two) times daily as needed (nausea, vomitting). 12/02/20  Yes Gorsuch, Ni, MD  Ensure (ENSURE) Take 237 mLs by mouth 2 (two) times daily between meals. 09/14/20  Yes [provider]  mirtazapine (REMERON) 15 MG tablet Take 15 mg by mouth at bedtime.   Yes [provider]  ondansetron (ZOFRAN) 8 MG tablet TAKE 1 TABLET BY MOUTH EVERY 8 HOURS AS NEEDED FOR REFRACTORY NAUSEA AND/OR VOMITING. START ON DAY 3 AFTER CARBOPLATIN CHEMO Patient taking differently: Take 8 mg by mouth every 8 (eight) hours as needed for vomiting or nausea. 11/14/20 11/14/21 Yes Gorsuch, Ni, MD  OXYCODONE-ASPIRIN PO Take 1 tablet by mouth every 6 (six) hours as needed (pain).   Yes [provider]  polyethylene glycol (MIRALAX / GLYCOLAX) 17 g packet Take 17 g by mouth daily as needed for severe constipation. Patient taking differently: Take 17 g by mouth 2 (two) times daily as needed for severe constipation. 09/14/20  Yes Antonieta Pert, MD  prochlorperazine (COMPAZINE) 10 MG tablet Take 1 tablet (10 mg total) by mouth every 6 (six) hours as needed (Nausea or vomiting). 7/78/24  Yes Delora Fuel, MD    Physical Exam:  Constitutional: Chronically ill appearing elderly lady who intermittently yells incoherently Vitals:   12/06/20 1100 12/06/20 1130 12/06/20 1157 12/06/20 1200  BP: (!) 142/65 (!) 147/82  (!) 144/65  Pulse: 83 91  84  Resp: 20 15  18   Temp:   98.1 F (36.7 C)   TempSrc:   Axillary   SpO2: 97% 100%  99%   Eyes: PERRL, lids and conjunctivae normal ENMT: Mucous membranes are dry.  Posterior pharynx clear of any exudate or lesions.  Neck: normal, supple, no masses, no thyromegaly Respiratory: clear to auscultation bilaterally, no wheezing, no crackles. Normal respiratory effort. No accessory muscle use.   Cardiovascular: Regular rate and rhythm, no murmurs / rubs / gallops. No extremity edema. 2+ pedal pulses. No carotid bruits.  Abdomen: no tenderness, no masses palpated. No hepatosplenomegaly. Bowel sounds positive.  Musculoskeletal: no clubbing / cyanosis. No joint deformity upper and lower extremities. Good ROM, no contractures. Normal muscle tone.  Skin: no rashes, lesions, ulcers. No induration Neurologic: CN 2-12 grossly intact. Sensation intact, DTR normal. Strength 5/5 in all 4.  Psychiatric: Normal judgment and insight. Alert and oriented x 3. Normal mood.     Labs on Admission: I have personally reviewed following labs and imaging studies  CBC: Recent Labs  Lab 12/02/20 2204 12/06/20 0822  WBC 7.0 9.8  NEUTROABS 6.4 6.8  HGB 10.7* 11.7*  HCT 33.0* 35.8*  MCV  88.9 89.5  PLT 291 976   Basic Metabolic Panel: Recent Labs  Lab 12/02/20 2204 12/06/20 0822  NA 131* 135  K 3.5 4.3  CL 95* 97*  CO2 27 27  GLUCOSE 132* 129*  BUN 44* 24*  CREATININE 1.07* 1.05*  CALCIUM 8.0* 8.2*   GFR: Estimated Creatinine Clearance: 31.7 mL/min (A) (by C-G formula based on SCr of 1.05 mg/dL (H)). Liver Function Tests: Recent Labs  Lab 12/02/20 2204 12/06/20 0822  AST 27 41  ALT 51* 68*  ALKPHOS 233* 221*  BILITOT 0.5 0.6  PROT 5.3* 5.1*  ALBUMIN 2.5* 2.5*   Recent Labs  Lab 12/02/20 2204  LIPASE 22   Recent Labs  Lab 12/06/20 0822  AMMONIA 19   Coagulation Profile: No results for input(s): INR, PROTIME in the last 168 hours. Cardiac Enzymes: No results for input(s): CKTOTAL, CKMB, CKMBINDEX, TROPONINI in the last 168 hours. BNP (last 3 results) No results for input(s): PROBNP in the last 8760 hours. HbA1C: No results for input(s): HGBA1C in the last 72 hours. CBG: No results for input(s): GLUCAP in the last 168 hours. Lipid Profile: No results for input(s): CHOL, HDL, LDLCALC, TRIG, CHOLHDL, LDLDIRECT in the last 72 hours. Thyroid Function Tests: No results  for input(s): TSH, T4TOTAL, FREET4, T3FREE, THYROIDAB in the last 72 hours. Anemia Panel: No results for input(s): VITAMINB12, FOLATE, FERRITIN, TIBC, IRON, RETICCTPCT in the last 72 hours. Urine analysis:    Component Value Date/Time   COLORURINE YELLOW 12/06/2020 0822   APPEARANCEUR HAZY (A) 12/06/2020 0822   LABSPEC 1.020 12/06/2020 0822   PHURINE 8.0 12/06/2020 0822   GLUCOSEU NEGATIVE 12/06/2020 0822   HGBUR NEGATIVE 12/06/2020 0822   BILIRUBINUR NEGATIVE 12/06/2020 0822   KETONESUR NEGATIVE 12/06/2020 0822   PROTEINUR 30 (A) 12/06/2020 0822   NITRITE NEGATIVE 12/06/2020 0822   LEUKOCYTESUR NEGATIVE 12/06/2020 0822   Sepsis Labs: Recent Results (from the past 240 hour(s))  Resp Panel by RT-PCR (Flu A&B, Covid) Urine, Clean Catch     Status: None   Collection Time: 12/02/20 11:16 PM   Specimen: Urine, Clean Catch; Nasopharyngeal(NP) swabs in vial transport medium  Result Value Ref Range Status   SARS Coronavirus 2 by RT PCR NEGATIVE NEGATIVE Final    Comment: (NOTE) SARS-CoV-2 target nucleic acids are NOT DETECTED.  The SARS-CoV-2 RNA is generally detectable in upper respiratory specimens during the acute phase of infection. The lowest concentration of SARS-CoV-2 viral copies this assay can detect is 138 copies/mL. A negative result does not preclude SARS-Cov-2 infection and should not be used as the sole basis for treatment or other patient management decisions. A negative result may occur with  improper specimen collection/handling, submission of specimen other than nasopharyngeal swab, presence of viral mutation(s) within the areas targeted by this assay, and inadequate number of viral copies(<138 copies/mL). A negative result must be combined with clinical observations, patient history, and epidemiological information. The expected result is Negative.  Fact Sheet for Patients:  EntrepreneurPulse.com.au  Fact Sheet for Healthcare Providers:   IncredibleEmployment.be  This test is no t yet approved or cleared by the [place] FDA and  has been authorized for detection and/or diagnosis of SARS-CoV-2 by FDA under an Emergency Use Authorization (EUA). This EUA will remain  in effect (meaning this test can be used) for the duration of the COVID-19 declaration under Section 564(b)(1) of the Act, 21 U.S.C.section 360bbb-3(b)(1), unless the authorization is terminated  or revoked sooner.  Influenza A by PCR NEGATIVE NEGATIVE Final   Influenza B by PCR NEGATIVE NEGATIVE Final    Comment: (NOTE) The Xpert Xpress SARS-CoV-2/FLU/RSV plus assay is intended as an aid in the diagnosis of influenza from Nasopharyngeal swab specimens and should not be used as a sole basis for treatment. Nasal washings and aspirates are unacceptable for Xpert Xpress SARS-CoV-2/FLU/RSV testing.  Fact Sheet for Patients: EntrepreneurPulse.com.au  Fact Sheet for Healthcare Providers: IncredibleEmployment.be  This test is not yet approved or cleared by the [place] FDA and has been authorized for detection and/or diagnosis of SARS-CoV-2 by FDA under an Emergency Use Authorization (EUA). This EUA will remain in effect (meaning this test can be used) for the duration of the COVID-19 declaration under Section 564(b)(1) of the Act, 21 U.S.C. section 360bbb-3(b)(1), unless the authorization is terminated or revoked.  Performed at [facility], [place] [address], [place], [place] [address]      Radiological Exams on Admission: CT Head Wo Contrast  Result Date: 12/06/2020 CLINICAL DATA:  Delirium. EXAM: CT HEAD WITHOUT CONTRAST TECHNIQUE: Contiguous axial images were obtained from the base of the skull through the vertex without intravenous contrast. COMPARISON:  MRI October 18, 2019. FINDINGS: Motion limited evaluation.  Within this limitation: Brain: No evidence of acute  large vascular territory infarction, hemorrhage, hydrocephalus, extra-axial collection or mass lesion/mass effect. Moderate white matter hypoattenuation, most likely related to chronic microvascular ischemic disease. Moderate atrophy with ex vacuo ventricular dilation, similar to prior. Vascular: No hyperdense vessel identified. Calcific atherosclerosis. Skull: No acute fracture. Sinuses/Orbits: Visualized sinuses are largely clear. Unremarkable orbits. Other: No mastoid effusions. IMPRESSION: 1. No evidence of acute intracranial abnormality on this motion limited exam. 2. Moderate chronic microvascular ischemic disease and atrophy. Electronically Signed   By: Margaretha Sheffield MD   On: 12/06/2020 09:58   DG Abdomen Acute W/Chest  Result Date: 12/06/2020 CLINICAL DATA:  Abdominal pain. EXAM: DG ABDOMEN ACUTE WITH 1 VIEW CHEST COMPARISON:  Most recent September 12, 2020. FINDINGS: There is gas within distal colon. Mildly dilated small bowel loops in the central abdomen (measuring approximately 3.2 cm on decubitus image). No evidence of free air. No evidence of nephrolithiasis, although overlying bowel gas significantly limits evaluation. Similar large calcified mass in the pelvis. Partially imaged right hip arthroplasty. Left hip and lumbar degenerative change. Cholecystectomy clips. Mild diffuse interstitial prominence, favored chronic. No consolidation. No visible pleural effusions or pneumothorax on the semi erect chest radiograph. Similar cardiomediastinal silhouette. Right reverse shoulder arthroplasty, partially imaged. Severe left shoulder degenerative change. IMPRESSION: 1. Mildly dilated small bowel loop in the central abdomen with gas in distal colon. Although indeterminate, findings could reflect a partial small bowel obstruction. CT abdomen/pelvis could further evaluate. 2. Similar large calcified mass in the pelvis, further evaluated on prior CT. Electronically Signed   By: Margaretha Sheffield MD   On:  12/06/2020 09:52    EKG: Independently reviewed.  Sinus rhythm at 92 bpm with PVC  Assessment/Plan Acute metabolic encephalopathy: Patient presents after found to be acutely altered by the husband this morning after 7 AM speaking gibberish.  While in the emergency department patient was placed on soft restraints and given Ativan.  CT scan of the head  without any acute abnormalities.  Suspect symptoms likely secondary to dehydration and/or medications.  However on the differential includes possibility of stroke. -Admit to a medical telemetry bed -Neurochecks -Check MRI of the brain  Ovarian cancer with malignant ascites: Patient showed recent improvement in  tumor markers while she had been on chemotherapy, but has clinically continued to decline.  Recurrent malignant ascites on physical exam.  Patient had just recently had paracentesis at [facility]. Abdominal x-ray showed concern for mildly dilated small bowel bowel loops decision abdomen.   Dr. Alvy Bimler of oncology had been notified.  After evaluating the patient she recommended no further chemotherapy and supportive measures palliative care and hospice. -CT scan of the abdomen and pelvis(showed no signs of bowel obstruction at this time.) -Appreciate oncology consultative services, we will follow-up for any further recommendation -Palliative care was consulted, will follow-up for any further recommended  Dehydration: On admission creatinine 1.05 with BUN 24.  The elevated BUN to creatinine ratio suggests prerenal cause of symptoms. -Normal saline IV fluids at 50 mL/h x 1 L  Failure to thrive severe protein calorie malnutrition: Patient has not been eating much food and is cachectic on physical exam with albumin of 2.5.  Husband reports weight loss. -Check prealbumin in a.m. -Ensure shakes meals  Anemia of chronic disease:  Hemoglobin 11.7 g/dL which appears near patient's baseline. -Continue to monitor.  Elevated liver enzymes:  Alkaline phosphatase elevated 221 and ALT 68.  Unclear cause of symptoms. -Continue to monitor  DVT prophylaxis: Lovenox Code Status: Full Family Communication: Husband updated at bedside Disposition Plan: To be determined Consults called: Oncology and palliative care Admission status: Inpatient, require more than 2 midnight stay  Norval Morton MD [facility]   If 7PM-7AM, please contact night-coverage   12/06/2020, 12:27 PM

## 2020-12-06 NOTE — ED Notes (Signed)
Pts husband is coming to join her, daughter is worried that husband may not be able to understand and asked that she be given a call if additional information is needed. [place]

## 2020-12-06 NOTE — ED Notes (Signed)
Pt started screaming, yelling and moving on the bed. Attempting to remove restraints. Continues to talk incoherently. Ativan IVP PRN given.

## 2020-12-06 NOTE — ED Notes (Signed)
Pt husband wants to talk to admitting MD. Dr.Smith sent a secured chat at this time. Waiting for reply.

## 2020-12-06 NOTE — Plan of Care (Signed)

## 2020-12-06 NOTE — ED Provider Notes (Signed)
[facility] EMERGENCY DEPARTMENT Provider Note   CSN: [id number] Arrival date & time: 12/06/20  9702     History Chief Complaint  Patient presents with   Altered Mental Status    [name] is a 80 y.o. female.   Altered Mental Status Level 5 caveat due to altered mental status.   Patient brought in for mental status change.  History of metastatic ovarian cancer.  Reviewing notes appears been doing poorly and there is due to have a goals of care discussion.  Recently seen at [facility] for nausea vomiting and bowel obstruction.  Is on Decadron Zofran Compazine and Remeron.  Reportedly has had decreased oral intake.  Reportedly confusion started a couple hours ago.  Patient is yelling nonsensical words.    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   Mild depression ([place]) 10/14/2020   Leg swelling 10/14/2020   Anemia due to chronic illness 10/07/2020   Severe protein-calorie malnutrition ([place]) 10/07/2020   Physical debility 09/25/2020   Lower back pain 09/18/2020   Goals of care, counseling/discussion 09/18/2020   Abdominal pain 09/09/2020   Intractable nausea and vomiting 09/09/2020   Elevated LFTs 09/09/2020   Nasal congestion 09/02/2020   Anorexia 09/02/2020   Generalized weakness 09/02/2020   Weight loss, abnormal 07/31/2020   Ovarian ca ([place]) 07/30/2020   Pelvic mass 07/26/2020   Malignant neoplasm  of other specified female genital organs ([place])  07/26/2020   Chronic kidney disease, stage 3 unspecified ([place]) 07/24/2020   Allergic rhinitis 07/24/2020   Amnesia 07/24/2020   Malignant ascites 07/24/2020   Chronic sinusitis 07/24/2020   Constipation 07/24/2020   Eczema 07/24/2020   Hearing loss 07/24/2020   History of pancreatitis 07/24/2020   Iron deficiency 07/24/2020   Knee pain 07/24/2020   Nocturia 07/24/2020   Osteopenia 07/24/2020   Polymyalgia rheumatica ([place]) 07/24/2020   Pure hypercholesterolemia 07/24/2020   Rosacea 07/24/2020   Sacroiliitis, not elsewhere classified ([place]) 07/24/2020   Sleep disturbance 07/24/2020   Spontaneous ecchymosis 07/24/2020   Vitamin D deficiency 07/24/2020   Prolapsed internal hemorrhoids 11/04/2017   Primary osteoarthritis of both shoulders 07/09/2016   Status post replacement of right shoulder joint 07/09/2016   Primary osteoarthritis of one hip, right 12/25/2014   Dermatochalasis 07/22/2012   Atrial fibrillation ([place]) 09/17/2011   Hyperlipidemia 09/17/2011    Past Surgical History:  Procedure Laterality Date   [place]   IR PARACENTESIS  11/19/2020   JOINT REPLACEMENT     right shoulder 2008; right hip 2016   [place]   TOTAL HIP ARTHROPLASTY Right 11/2014   [facility], [place], [place]     [place] History     Gravida  2   Para  2   Term  Preterm      AB      Living         SAB      IAB      Ectopic      Multiple      Live Births              Family History  Problem Relation Age of Onset   Dementia Mother    Memory loss Brother    Hodgkin's lymphoma Father    Kidney failure Brother     Social History   Tobacco Use   Smoking status: Former    Packs/day: 2.50    Pack years: 0.00    Types: Cigarettes    Quit date: 1980    Years since quitting: 42.4   Smokeless tobacco: Never  Vaping Use   Vaping Use:  Never used  Substance Use Topics   Alcohol use: Not Currently   Drug use: Never    Home Medications Prior to Admission medications   Medication Sig Start Date End Date Taking? Authorizing Provider  dexamethasone (DECADRON) 4 MG tablet Take 2 tablets (8 mg total) by mouth 2 (two) times daily with a meal. Patient taking differently: Take 8 mg by mouth 2 (two) times daily as needed (nausea, vomitting). 12/02/20  Yes Gorsuch, Ni, MD  Ensure (ENSURE) Take 237 mLs by mouth 2 (two) times daily between meals. 09/14/20  Yes [provider]  mirtazapine (REMERON) 15 MG tablet Take 15 mg by mouth at bedtime.   Yes [provider]  ondansetron (ZOFRAN) 8 MG tablet TAKE 1 TABLET BY MOUTH EVERY 8 HOURS AS NEEDED FOR REFRACTORY NAUSEA AND/OR VOMITING. START ON DAY 3 AFTER CARBOPLATIN CHEMO Patient taking differently: Take 8 mg by mouth every 8 (eight) hours as needed for vomiting or nausea. 11/14/20 11/14/21 Yes Gorsuch, Ni, MD  OXYCODONE-ASPIRIN PO Take 1 tablet by mouth every 6 (six) hours as needed (pain).   Yes [provider]  polyethylene glycol (MIRALAX / GLYCOLAX) 17 g packet Take 17 g by mouth daily as needed for severe constipation. Patient taking differently: Take 17 g by mouth 2 (two) times daily as needed for severe constipation. 09/14/20  Yes Antonieta Pert, MD  prochlorperazine (COMPAZINE) 10 MG tablet Take 1 tablet (10 mg total) by mouth every 6 (six) hours as needed (Nausea or vomiting). 5/64/33  Yes Delora Fuel, MD    Allergies    Cephalexin, Hydrocodone-guaifenesin, Other, Tape, and Tramadol hcl  Review of Systems   Review of Systems  Unable to perform ROS: Mental status change   Physical Exam Updated Vital Signs BP (!) 147/82   Pulse 91   Temp 98.1 F (36.7 C) (Axillary)   Resp 15   SpO2 100%   Physical Exam Vitals and nursing note reviewed.  HENT:     Head: Atraumatic.     Right Ear: External ear normal.     Left Ear: External ear normal.  Eyes:      General: No scleral icterus. Cardiovascular:     Rate and Rhythm: Tachycardia present.  Pulmonary:     Breath sounds: No wheezing or rhonchi.  Abdominal:     General: There is distension.     Tenderness: There is abdominal tenderness.     Comments: Suprapubic mass.  No definite hernia palpated.  Musculoskeletal:     Cervical back: Neck supple.     Right lower leg: No edema.     Left lower leg: No edema.  Skin:    General: Skin is warm.  Neurological:     Mental Status: She is alert.     Comments: Awake and appears to be moving all extremities.  Yelling nonsensical words.  Will not follow commands.    ED Results / Procedures / Treatments   Labs (all labs ordered are listed, but only abnormal results are displayed) Labs Reviewed  URINALYSIS, ROUTINE W REFLEX MICROSCOPIC - Abnormal; Notable for the following components:      Result Value   APPearance HAZY (*)    Protein, ur 30 (*)    All other components within normal limits  COMPREHENSIVE METABOLIC PANEL - Abnormal; Notable for the following components:   Chloride 97 (*)    Glucose, Bld 129 (*)    BUN 24 (*)    Creatinine, Ser 1.05 (*)    Calcium 8.2 (*)    Total Protein 5.1 (*)    Albumin 2.5 (*)    ALT 68 (*)    Alkaline Phosphatase 221 (*)    GFR, Estimated 53 (*)    All other components within normal limits  CBC WITH DIFFERENTIAL/PLATELET - Abnormal; Notable for the following components:   Hemoglobin 11.7 (*)    HCT 35.8 (*)    Abs Immature Granulocytes 0.16 (*)    All other components within normal limits  AMMONIA    EKG None  Radiology CT Head Wo Contrast  Result Date: 12/06/2020 CLINICAL DATA:  Delirium. EXAM: CT HEAD WITHOUT CONTRAST TECHNIQUE: Contiguous axial images were obtained from the base of the skull through the vertex without intravenous contrast. COMPARISON:  MRI October 18, 2019. FINDINGS: Motion limited evaluation.  Within this limitation: Brain: No evidence of acute large vascular territory  infarction, hemorrhage, hydrocephalus, extra-axial collection or mass lesion/mass effect. Moderate white matter hypoattenuation, most likely related to chronic microvascular ischemic disease. Moderate atrophy with ex vacuo ventricular dilation, similar to prior. Vascular: No hyperdense vessel identified. Calcific atherosclerosis. Skull: No acute fracture. Sinuses/Orbits: Visualized sinuses are largely clear. Unremarkable orbits. Other: No mastoid effusions. IMPRESSION: 1. No evidence of acute intracranial abnormality on this motion limited exam. 2. Moderate chronic microvascular ischemic disease and atrophy. Electronically Signed   By: Margaretha Sheffield MD   On: 12/06/2020 09:58   DG Abdomen Acute W/Chest  Result Date: 12/06/2020 CLINICAL DATA:  Abdominal pain. EXAM: DG ABDOMEN ACUTE WITH 1 VIEW CHEST COMPARISON:  Most recent September 12, 2020. FINDINGS: There is gas within distal colon. Mildly dilated small bowel loops in the central abdomen (measuring approximately 3.2 cm on decubitus image). No evidence of free air. No evidence of nephrolithiasis, although overlying bowel gas significantly limits evaluation. Similar large calcified mass in the pelvis. Partially imaged right hip arthroplasty. Left hip and lumbar degenerative change. Cholecystectomy clips. Mild diffuse interstitial prominence, favored chronic. No consolidation. No visible pleural effusions or pneumothorax on the semi erect chest radiograph. Similar cardiomediastinal silhouette. Right reverse shoulder arthroplasty, partially imaged. Severe left shoulder degenerative change. IMPRESSION: 1. Mildly dilated small bowel loop in the central abdomen with gas in distal colon. Although indeterminate, findings could reflect a partial small bowel obstruction. CT abdomen/pelvis could further evaluate. 2. Similar large calcified mass in the pelvis, further evaluated on prior CT. Electronically Signed   By: Margaretha Sheffield MD   On: 12/06/2020 09:52     Procedures Procedures   Medications Ordered in ED Medications  LORazepam (ATIVAN) injection 1 mg (1 mg Intravenous Given 12/06/20 0850)    ED Course  I have  reviewed the triage vital signs and the nursing notes.  Pertinent labs & imaging results that were available during my care of the patient were reviewed by me and considered in my medical decision making (see chart for details).    MDM Rules/Calculators/A&P                          Patient brought in for mental status changes.  Has known metastatic ovarian cancer but has been doing worse.  Decreased oral intake.  Increased cachexia.  Recurrent ascites.  Had not been able to do paracentesis due to malnutrition.  Also reportedly had decreased last dose of chemotherapy due to complications of the chemo.  Patient with mental status change today.  Yelling and incoherent.  Head CT reassuring.  Lab work reviewed and stable.  Patient not doing well at home.  Patient's husband later arrived and discussed with him.  He thinks that she needs placement since he has not been able to manage at home.  There is talk of having a goals of care discussion and I think this needs to happen since she has an overall poor prognosis with this.  I think with mental status change acutely patient will benefit from admission to the hospital.  Patient has potential early small bowel obstruction on x-ray.  Not actively vomiting here.  Recent CTs for same.  At this point chose to monitor instead of CT scan.  Will discuss with hospitalist Final Clinical Impression(s) / ED Diagnoses Final diagnoses:  Altered mental status, unspecified altered mental status type  Metastatic malignant neoplasm, unspecified site [facility])    Rx / DC Orders ED Discharge Orders     None        Davonna Belling, MD 12/06/20 1215

## 2020-12-06 NOTE — ED Notes (Signed)
Pt came in via EMS for AMS. Lives at home with husband. Pt recently finished radiation for ovarian cancer. Reports decreased appetite and intake at home. Pt screaming and talking incoherently. Pulling lines and uncooperative with staff. Unable to comprehend anything that pt says. MD at bedside. Soft restraints to bilateral wrists applied at this time. Pt continues to scream and yells incoherently. Safety precautions maintained. Cardiac monitoring in place. Will continue to monitor. CT and xray delayed at this time until pt calms down. Ativan IVP given.

## 2020-12-06 NOTE — Progress Notes (Signed)
[name] [facility]   DOB:01/19/1941   KY#:[id number]    ASSESSMENT & PLAN:  Ovarian ca ([place]) Clinically, she continues to decline Even though her tumor marker was improving, I suspect it is because of repeat paracentesis She has rapidly reaccumulated malignant ascites on exam Her nutritional status continues to decline Her CT imaging showed no changes to the calcified lesions However, clinically, with recurrent ascites, this represent disease progression and refractory cancer I explained to the patient's husband and 2 daughters that I would not recommend further chemotherapy I recommend we focus on supportive care through palliative care and hospice For the first time today, I since her husband's willingness to consider palliative care with hospice In the past, at the mention of stopping chemotherapy, both the patient and her husband will react negatively I will definitely place consult for palliative care to continue to work with the patient and family for end-of-life care  Acute delirium Could be due to side effects of medication as well as dehydration or electrolyte changes due to rapid fluid shift from recent paracentesis  It does not appear she has signs of infection The patient also has not been sleeping well over the past few weeks  anemia due to chronic illness She is not symptomatic Observe for now   Malignant ascites She has recurrent malignant ascites on exam She is not symptomatic In the absence of symptoms, I do not recommend repeat paracentesis She had paracentesis last weekend at [facility] health  I suspect this might have caused dehydration  weight loss, abnormal She continues to have significant weight loss This is due to malignant cachexia She has no benefit from mirtazapine or dexamethasone I explained to the patient's family the rationale of not pursuing placement of feeding tube    Elevated liver enzymes She has abnormal elevated liver enzymes likely due to side  effects of chemo  Goals of care discussion I have spent almost 1 hour over the phone explaining clinical findings to the patient's husband and 2 daughters With her progressive decline, I do not recommend further chemotherapy I explained to the patient's family why surgery is not indicated I explained to them why feeding tube placement is not indicated I explained the importance of focusing on palliative care and hospice rather than aggressive medical intervention If the patient does not want to eat, we should let her be If she does not want to drink, we would let her be I explained to them home-based palliative care with hospice services upon discharge and her husband appeared to be interested  Discharge planning Not ready for discharge I will follow this weekend  All questions were answered. The patient knows to call the clinic with any problems, questions or concerns.   The total time spent in the appointment was 80 minutes encounter with patients including review of chart and various tests results, discussions about plan of care and coordination of care plan  [name], MD 12/06/2020 4:16 PM  Subjective:  I was notified of her admission. She is supposed to be seen in the clinic today at 2 pm. I was made aware this morning that she was brought in by ambulance due to altered mental status When I went to her room, she is sleeping.  Earlier part of the day, she was delirious and behaving aggressive towards staff She had CT imaging of the head which rule out stroke and CT abdomen and pelvis which show persistent ascites and no clinical changes to her disease Blood work and  other test results were reviewed with family I contacted her daughters several times and finally got hold of them.  We have long discussion over the phone.  Both of her daughters and her husband were present throughout the conversation.  We discussed and addressed all their questions for almost 1 hour just over the  phone  Objective:  Vitals:   12/06/20 1500 12/06/20 1600  BP: (!) 120/57 109/63  Pulse: 78 72  Resp: 18 17  Temp:    SpO2: 98% 100%    No intake or output data in the 24 hours ending 12/06/20 1616  GENERAL she appears to be sleeping comfortably   Labs:  Recent Labs    11/28/20 1229 12/02/20 2204 12/06/20 0822  NA 134* 131* 135  K 3.6 3.5 4.3  CL 100 95* 97*  CO2 24 27 27   GLUCOSE 141* 132* 129*  BUN 24* 44* 24*  CREATININE 0.83 1.07* 1.05*  CALCIUM 8.5* 8.0* 8.2*  GFRNONAA >60 52* 53*  PROT 5.4* 5.3* 5.1*  ALBUMIN 2.3* 2.5* 2.5*  AST 43* 27 41  ALT 74* 51* 68*  ALKPHOS 409* 233* 221*  BILITOT 0.2* 0.5 0.6    Studies: I personally reviewed imaging study CT ABDOMEN PELVIS WO CONTRAST  Result Date: 12/06/2020 CLINICAL DATA:  Abdominal pain and distension EXAM: CT ABDOMEN AND PELVIS WITHOUT CONTRAST TECHNIQUE: Multidetector CT imaging of the abdomen and pelvis was performed following the standard protocol without IV contrast. COMPARISON:  Plain film from earlier in the same day, CT from 09/11/2020 and 11/28/2020 FINDINGS: Lower chest: Tiny effusions are noted bilaterally. No focal infiltrate or sizable nodule is seen. Hepatobiliary: No focal liver abnormality is seen. Status post cholecystectomy. No biliary dilatation. Pancreas: Unremarkable. No pancreatic ductal dilatation or surrounding inflammatory changes. Spleen: Normal in size without focal abnormality. Adrenals/Urinary Tract: Adrenal glands are within normal limits. Kidneys are well visualized. No renal calculi or obstructive changes are noted. Renal cyst is noted in the upper pole on the left stable in appearance from multiple previous exams. Bladder is decompressed. Stomach/Bowel: Colon shows no obstructive or inflammatory changes. Appendix has been surgically removed. Stomach is decompressed. No sizeable small-bowel dilatation is seen. Previously seen small bowel dilatation secondary to an anterior abdominal wall hernia  is no longer present. The hernia has been reduced with only minimal amount of ascites within the hernia sac. Vascular/Lymphatic: Aortic atherosclerosis. No enlarged abdominal or pelvic lymph nodes. Reproductive: Large irregularly calcified mass lesion is noted arising from the right adnexa. This is consistent with the patient's given clinical history of ovarian carcinoma. Multiple scattered mesenteric calcified masses are noted consistent with prior metastatic disease. The overall appearance is similar to that seen on prior CT examinations. The uterus and left adnexa appear within normal limits. Other: Moderate ascites is noted primarily surrounding the liver. The overall degree of ascites is improved from the prior CT from 11/28/2020. No free air is seen. Musculoskeletal: Right hip replacement is noted. Degenerative changes of lumbar spine are seen. No lytic or sclerotic lesions are identified to suggest osseous metastatic disease. No compression deformities are noted. Mild anterolisthesis of L5 on S1 is again identified. Large lipoma is noted in the left lateral abdominal wall stable in appearance from previous exams. IMPRESSION: Moderate ascites although improved when compared with the prior exam of 11/28/2020. Heavily calcified right adnexal and pelvic mass consistent with the patient's known history of ovarian carcinoma. Multiple calcified mesenteric masses are again identified and stable. Previously seen small bowel dilatation  has resolved following reduction of anterior abdominal wall hernia. The hernia contains only ascitic fluid at this time. Tiny bilateral pleural effusions. Electronically Signed   By: Inez Catalina M.D.   On: 12/06/2020 15:08   CT Head Wo Contrast  Result Date: 12/06/2020 CLINICAL DATA:  Delirium. EXAM: CT HEAD WITHOUT CONTRAST TECHNIQUE: Contiguous axial images were obtained from the base of the skull through the vertex without intravenous contrast. COMPARISON:  MRI October 18, 2019.  FINDINGS: Motion limited evaluation.  Within this limitation: Brain: No evidence of acute large vascular territory infarction, hemorrhage, hydrocephalus, extra-axial collection or mass lesion/mass effect. Moderate white matter hypoattenuation, most likely related to chronic microvascular ischemic disease. Moderate atrophy with ex vacuo ventricular dilation, similar to prior. Vascular: No hyperdense vessel identified. Calcific atherosclerosis. Skull: No acute fracture. Sinuses/Orbits: Visualized sinuses are largely clear. Unremarkable orbits. Other: No mastoid effusions. IMPRESSION: 1. No evidence of acute intracranial abnormality on this motion limited exam. 2. Moderate chronic microvascular ischemic disease and atrophy. Electronically Signed   By: Margaretha Sheffield MD   On: 12/06/2020 09:58   DG Abdomen Acute W/Chest  Result Date: 12/06/2020 CLINICAL DATA:  Abdominal pain. EXAM: DG ABDOMEN ACUTE WITH 1 VIEW CHEST COMPARISON:  Most recent September 12, 2020. FINDINGS: There is gas within distal colon. Mildly dilated small bowel loops in the central abdomen (measuring approximately 3.2 cm on decubitus image). No evidence of free air. No evidence of nephrolithiasis, although overlying bowel gas significantly limits evaluation. Similar large calcified mass in the pelvis. Partially imaged right hip arthroplasty. Left hip and lumbar degenerative change. Cholecystectomy clips. Mild diffuse interstitial prominence, favored chronic. No consolidation. No visible pleural effusions or pneumothorax on the semi erect chest radiograph. Similar cardiomediastinal silhouette. Right reverse shoulder arthroplasty, partially imaged. Severe left shoulder degenerative change. IMPRESSION: 1. Mildly dilated small bowel loop in the central abdomen with gas in distal colon. Although indeterminate, findings could reflect a partial small bowel obstruction. CT abdomen/pelvis could further evaluate. 2. Similar large calcified mass in the pelvis,  further evaluated on prior CT. Electronically Signed   By: Margaretha Sheffield MD   On: 12/06/2020 09:52   IR Paracentesis  Result Date: 11/19/2020 INDICATION: History of ovarian cancer with recurrent malignant ascites. Request for therapeutic paracentesis up to 5 L. EXAM: ULTRASOUND GUIDED  PARACENTESIS MEDICATIONS: 10 mL 1% lidocaine COMPLICATIONS: None immediate. PROCEDURE: Informed written consent was obtained from the patient after a discussion of the risks, benefits and alternatives to treatment. A timeout was performed prior to the initiation of the procedure. Initial ultrasound scanning demonstrates a large amount of ascites within the right lower abdominal quadrant. The right lower abdomen was prepped and draped in the usual sterile fashion. 1% lidocaine was used for local anesthesia. Following this, a 19 gauge, 7-cm, Yueh catheter was introduced. An ultrasound image was saved for documentation purposes. The paracentesis was performed. The catheter was removed and a dressing was applied. The patient tolerated the procedure well without immediate post procedural complication. FINDINGS: A total of approximately 5 L of clear yellow fluid was removed. IMPRESSION: Successful ultrasound-guided paracentesis yielding 5 liters of peritoneal fluid. Read by: Durenda Guthrie, PA-C Electronically Signed   By: Corrie Mckusick D.O.   On: 11/19/2020 15:09

## 2020-12-06 NOTE — ED Notes (Signed)
Pt is now awake and started talking incoherently. Pt is moving on the bed frequently. Bilateral soft restraints to both wrists continued. Pt is hard to redirect. Husband is at bedside. Husband is asking to talk to MD regarding possible nursing home transfer as he takes care of pt at home and he feels like he cannot take care of her anymore. Will notify MD.

## 2020-12-06 NOTE — ED Triage Notes (Signed)
EMS was called for AMS and weakness. Last round of radiation recently. Pt is screaming, uncooperative with EMS, pulling lines and talking incoherently.  EMS vitals:  BP 146/82 HR 96 O2 96% CBG 146

## 2020-12-07 ENCOUNTER — Inpatient Hospital Stay (HOSPITAL_COMMUNITY): Payer: Medicare Other

## 2020-12-07 DIAGNOSIS — Z515 Encounter for palliative care: Secondary | ICD-10-CM

## 2020-12-07 DIAGNOSIS — G9341 Metabolic encephalopathy: Secondary | ICD-10-CM

## 2020-12-07 DIAGNOSIS — Z7189 Other specified counseling: Secondary | ICD-10-CM

## 2020-12-07 LAB — COMPREHENSIVE METABOLIC PANEL
ALT: 53 U/L — ABNORMAL HIGH (ref 0–44)
AST: 28 U/L (ref 15–41)
Albumin: 2.4 g/dL — ABNORMAL LOW (ref 3.5–5.0)
Alkaline Phosphatase: 206 U/L — ABNORMAL HIGH (ref 38–126)
Anion gap: 12 (ref 5–15)
BUN: 18 mg/dL (ref 8–23)
CO2: 24 mmol/L (ref 22–32)
Calcium: 7.9 mg/dL — ABNORMAL LOW (ref 8.9–10.3)
Chloride: 98 mmol/L (ref 98–111)
Creatinine, Ser: 0.77 mg/dL (ref 0.44–1.00)
GFR, Estimated: >60 mL/min (ref >60)
Glucose, Bld: 83 mg/dL (ref 70–99)
Potassium: 3.6 mmol/L (ref 3.5–5.1)
Sodium: 134 mmol/L — ABNORMAL LOW (ref 135–145)
Total Bilirubin: 0.7 mg/dL (ref 0.3–1.2)
Total Protein: 4.9 g/dL — ABNORMAL LOW (ref 6.5–8.1)

## 2020-12-07 LAB — GLUCOSE, CAPILLARY
Glucose-Capillary: 91 mg/dL (ref 70–99)
Glucose-Capillary: 119 mg/dL — ABNORMAL HIGH (ref 70–99)
Glucose-Capillary: 101 mg/dL — ABNORMAL HIGH (ref 70–99)
Glucose-Capillary: 109 mg/dL — ABNORMAL HIGH (ref 70–99)
Glucose-Capillary: 93 mg/dL (ref 70–99)

## 2020-12-07 LAB — CBC
HCT: 38.2 % (ref 36.0–46.0)
Hemoglobin: 12.6 g/dL (ref 12.0–15.0)
MCH: 29 pg (ref 26.0–34.0)
MCHC: 33 g/dL (ref 30.0–36.0)
MCV: 88 fL (ref 80.0–100.0)
Platelets: 177 10*3/uL (ref 150–400)
RBC: 4.34 MIL/uL (ref 3.87–5.11)
RDW: 15.3 % (ref 11.5–15.5)
WBC: 8 10*3/uL (ref 4.0–10.5)
nRBC: 0 % (ref 0.0–0.2)

## 2020-12-07 LAB — PREALBUMIN: Prealbumin: 18.3 mg/dL (ref 18–38)

## 2020-12-07 MED ORDER — OXYCODONE-ACETAMINOPHEN 5-325 MG PO TABS
1.0000 | ORAL_TABLET | ORAL | Status: DC | PRN
  Administered 2020-12-07 – 2020-12-08 (×3): 2 via ORAL
  Administered 2020-12-09 (×2): 1 via ORAL
  Filled 2020-12-07: qty 1
  Filled 2020-12-07 (×3): qty 2
  Filled 2020-12-07: qty 1

## 2020-12-07 NOTE — Progress Notes (Signed)
PROGRESS NOTE  [name]:[id number] DOB: April 16, 1941 DOA: 12/06/2020 PCP: [name], MD   LOS: 1 day   Brief narrative:  [name] is a 80 y.o. female with medical history significant of ovarian cancer on chemotherapy, atrial fibrillation, anemia, arthritis, and GERD  presented from home after being found to be acutely altered talking "gibberish" which did not make any sense..  Patient was recently hospitalized at [facility] from 6/2-6/4 after presenting with abdominal pain found to have concern for small bowel obstruction on CT.  During the hospitalization patient had received a paracentesis which removed approximately 4 L of fluid with improvement in patient's abdominal pain.  In the ED, vital signs were stable.  CT scan of the brain showed no acute abnormalities.  Abdominal x-ray was significant for mildly dilated loops of bowel and a large calcified mass in the pelvis.  Patient had been given 1 mg of Ativan and placed in soft restraints.  Patient was then admitted to hospital for further evaluation and treatment.  Assessment/Plan:  Principal Problem:   Acute metabolic encephalopathy Active Problems:   Malignant ascites   Ovarian ca (HCC)   Elevated LFTs   Anemia due to chronic illness   Severe protein-calorie malnutrition (HCC)   Failure to thrive in adult   Acute metabolic encephalopathy:   CT scan of the head  without any acute abnormalities.  Patient has advanced ovarian cancer.  Thought to be secondary to dehydration and medications.  MRI has been discontinued as per discussion with the family.   Ovarian cancer with malignant ascites:  And with continued clinical decline and recurrent malignant ascites.  Oncology on board.  Recommend palliative care and hospice.  CT scan of the abdomen and pelvis without any signs of bowel obstruction.  Palliative care on board.    Volume depletion/dehydration:  Received gentle IV fluids.  Poor oral intake.  We will  hold off with further IV fluids due to possibility of recurrent ascites.   Failure to thrive/ severe protein calorie malnutrition/poor oral intake.:  Secondary to advanced ovarian cancer.  Albumin of 2.5.  Intake.  Continue supplements.  Not a good prognosis.  On Remeron.  Discussed against tube feeding.   Anemia of chronic disease: At baseline.  No evidence of bleeding.  Deconditioning, debility.  Patient with advanced cancer and severe malnutrition.Marland Kitchen  Poor prognosis.  Goals of care.  Discussed with the patient as well as patient's husband at bedside.  Patient husband is inquiring about enteral nutrition.  Patient's daughter at bedside as well.  DVT prophylaxis: enoxaparin (LOVENOX) injection 40 mg Start: 12/06/20 1400    Code Status: Full code  Family Communication:  I had a prolonged discussion with the patient's husband and daughter at bedside multiple occasions.  Discussed about goals of care.  We will continue to have discussion with palliative care and oncology team.  Status is: Inpatient  Remains inpatient appropriate because:IV treatments appropriate due to intensity of illness or inability to take PO and Inpatient level of care appropriate due to severity of illness  Dispo: The patient is from: Home              Anticipated d/c is to:  Undetermined at this time              Patient currently is not medically stable to d/c.   Difficult to place patient No   Consultants: Oncology Palliative care  Procedures: None  Anti-infectives:  None  Anti-infectives (From  admission, onward)    None      Subjective: Today, patient was seen and examined at bedside.  Talking gibberish words at times, denies pain, nausea, vomiting  Objective: Vitals:   12/07/20 0800 12/07/20 1100  BP: (!) 108/54 (!) 129/91  Pulse: 67 72  Resp: 19 19  Temp: 97.6 F (36.4 C) 97.9 F (36.6 C)  SpO2: 95% 98%    Intake/Output Summary (Last 24 hours) at 12/07/2020 1359 Last data filed  at 12/07/2020 0553 Gross per 24 hour  Intake 667.33 ml  Output --  Net 667.33 ml   Filed Weights   12/06/20 2000  Weight: 53.1 kg   Body mass index is 22.12 kg/m.   Physical Exam: GENERAL: Patient is alert awake communicative but worse at times, cachectic,  appears ill and deconditioned.   HENT: No scleral pallor or icterus. Pupils equally reactive to light. Oral mucosa is moist NECK: is supple, no gross swelling noted. CHEST: Clear to auscultation. No crackles or wheezes.  Diminished breath sounds bilaterally. CVS: S1 and S2 heard, no murmur. Regular rate and rhythm.  ABDOMEN: Soft, non-tender, bowel sounds are present.  Mild ascites present EXTREMITIES: Moving extremities. CNS: Cranial nerves are intact.  Generalized weakness noted, SKIN: warm and dry without rashes.  Data Review: I have personally reviewed the following laboratory data and studies,  CBC: Recent Labs  Lab 12/02/20 2204 12/06/20 0822 12/07/20 0226  WBC 7.0 9.8 8.0  NEUTROABS 6.4 6.8  --   HGB 10.7* 11.7* 12.6  HCT 33.0* 35.8* 38.2  MCV 88.9 89.5 88.0  PLT 291 232 211   Basic Metabolic Panel: Recent Labs  Lab 12/02/20 2204 12/06/20 0822 12/07/20 0226  NA 131* 135 134*  K 3.5 4.3 3.6  CL 95* 97* 98  CO2 27 27 24   GLUCOSE 132* 129* 83  BUN 44* 24* 18  CREATININE 1.07* 1.05* 0.77  CALCIUM 8.0* 8.2* 7.9*   Liver Function Tests: Recent Labs  Lab 12/02/20 2204 12/06/20 0822 12/07/20 0226  AST 27 41 28  ALT 51* 68* 53*  ALKPHOS 233* 221* 206*  BILITOT 0.5 0.6 0.7  PROT 5.3* 5.1* 4.9*  ALBUMIN 2.5* 2.5* 2.4*   Recent Labs  Lab 12/02/20 2204  LIPASE 22   Recent Labs  Lab 12/06/20 0822  AMMONIA 19   Cardiac Enzymes: No results for input(s): CKTOTAL, CKMB, CKMBINDEX, TROPONINI in the last 168 hours. BNP (last 3 results) No results for input(s): BNP in the last 8760 hours.  ProBNP (last 3 results) No results for input(s): PROBNP in the last 8760 hours.  CBG: Recent Labs  Lab  12/06/20 1952 12/06/20 2324 12/07/20 0302 12/07/20 0803 12/07/20 1214  GLUCAP 91 74 91 119* 101*   Recent Results (from the past 240 hour(s))  Resp Panel by RT-PCR (Flu A&B, Covid) Urine, Clean Catch     Status: None   Collection Time: 12/02/20 11:16 PM   Specimen: Urine, Clean Catch; Nasopharyngeal(NP) swabs in vial transport medium  Result Value Ref Range Status   SARS Coronavirus 2 by RT PCR NEGATIVE NEGATIVE Final    Comment: (NOTE) SARS-CoV-2 target nucleic acids are NOT DETECTED.  The SARS-CoV-2 RNA is generally detectable in upper respiratory specimens during the acute phase of infection. The lowest concentration of SARS-CoV-2 viral copies this assay can detect is 138 copies/mL. A negative result does not preclude SARS-Cov-2 infection and should not be used as the sole basis for treatment or other patient management decisions. A negative result may  occur with  improper specimen collection/handling, submission of specimen other than nasopharyngeal swab, presence of viral mutation(s) within the areas targeted by this assay, and inadequate number of viral copies(<138 copies/mL). A negative result must be combined with clinical observations, patient history, and epidemiological information. The expected result is Negative.  Fact Sheet for Patients:  EntrepreneurPulse.com.au  Fact Sheet for Healthcare Providers:  IncredibleEmployment.be  This test is no t yet approved or cleared by the [place] FDA and  has been authorized for detection and/or diagnosis of SARS-CoV-2 by FDA under an Emergency Use Authorization (EUA). This EUA will remain  in effect (meaning this test can be used) for the duration of the COVID-19 declaration under Section 564(b)(1) of the Act, 21 U.S.C.section 360bbb-3(b)(1), unless the authorization is terminated  or revoked sooner.       Influenza A by PCR NEGATIVE NEGATIVE Final   Influenza B by PCR NEGATIVE  NEGATIVE Final    Comment: (NOTE) The Xpert Xpress SARS-CoV-2/FLU/RSV plus assay is intended as an aid in the diagnosis of influenza from Nasopharyngeal swab specimens and should not be used as a sole basis for treatment. Nasal washings and aspirates are unacceptable for Xpert Xpress SARS-CoV-2/FLU/RSV testing.  Fact Sheet for Patients: EntrepreneurPulse.com.au  Fact Sheet for Healthcare Providers: IncredibleEmployment.be  This test is not yet approved or cleared by the [place] FDA and has been authorized for detection and/or diagnosis of SARS-CoV-2 by FDA under an Emergency Use Authorization (EUA). This EUA will remain in effect (meaning this test can be used) for the duration of the COVID-19 declaration under Section 564(b)(1) of the Act, 21 U.S.C. section 360bbb-3(b)(1), unless the authorization is terminated or revoked.  Performed at [facility], [place] [address] [place], [place] [address]   Resp Panel by RT-PCR (Flu A&B, Covid) Nasopharyngeal Swab     Status: None   Collection Time: 12/06/20  3:03 PM   Specimen: Nasopharyngeal Swab; Nasopharyngeal(NP) swabs in vial transport medium  Result Value Ref Range Status   SARS Coronavirus 2 by RT PCR NEGATIVE NEGATIVE Final    Comment: (NOTE) SARS-CoV-2 target nucleic acids are NOT DETECTED.  The SARS-CoV-2 RNA is generally detectable in upper respiratory specimens during the acute phase of infection. The lowest concentration of SARS-CoV-2 viral copies this assay can detect is 138 copies/mL. A negative result does not preclude SARS-Cov-2 infection and should not be used as the sole basis for treatment or other patient management decisions. A negative result may occur with  improper specimen collection/handling, submission of specimen other than nasopharyngeal swab, presence of viral mutation(s) within the areas targeted by this assay, and inadequate number of  viral copies(<138 copies/mL). A negative result must be combined with clinical observations, patient history, and epidemiological information. The expected result is Negative.  Fact Sheet for Patients:  EntrepreneurPulse.com.au  Fact Sheet for Healthcare Providers:  IncredibleEmployment.be  This test is no t yet approved or cleared by the [place] FDA and  has been authorized for detection and/or diagnosis of SARS-CoV-2 by FDA under an Emergency Use Authorization (EUA). This EUA will remain  in effect (meaning this test can be used) for the duration of the COVID-19 declaration under Section 564(b)(1) of the Act, 21 U.S.C.section 360bbb-3(b)(1), unless the authorization is terminated  or revoked sooner.       Influenza A by PCR NEGATIVE NEGATIVE Final   Influenza B by PCR NEGATIVE NEGATIVE Final    Comment: (NOTE) The Xpert Xpress SARS-CoV-2/FLU/RSV plus assay is intended as  an aid in the diagnosis of influenza from Nasopharyngeal swab specimens and should not be used as a sole basis for treatment. Nasal washings and aspirates are unacceptable for Xpert Xpress SARS-CoV-2/FLU/RSV testing.  Fact Sheet for Patients: EntrepreneurPulse.com.au  Fact Sheet for Healthcare Providers: IncredibleEmployment.be  This test is not yet approved or cleared by the [place] FDA and has been authorized for detection and/or diagnosis of SARS-CoV-2 by FDA under an Emergency Use Authorization (EUA). This EUA will remain in effect (meaning this test can be used) for the duration of the COVID-19 declaration under Section 564(b)(1) of the Act, 21 U.S.C. section 360bbb-3(b)(1), unless the authorization is terminated or revoked.  Performed at [facility], [place] [address], [place], [place] [address]      Studies: CT ABDOMEN PELVIS WO CONTRAST  Result Date: 12/06/2020 CLINICAL DATA:  Abdominal pain and  distension EXAM: CT ABDOMEN AND PELVIS WITHOUT CONTRAST TECHNIQUE: Multidetector CT imaging of the abdomen and pelvis was performed following the standard protocol without IV contrast. COMPARISON:  Plain film from earlier in the same day, CT from 09/11/2020 and 11/28/2020 FINDINGS: Lower chest: Tiny effusions are noted bilaterally. No focal infiltrate or sizable nodule is seen. Hepatobiliary: No focal liver abnormality is seen. Status post cholecystectomy. No biliary dilatation. Pancreas: Unremarkable. No pancreatic ductal dilatation or surrounding inflammatory changes. Spleen: Normal in size without focal abnormality. Adrenals/Urinary Tract: Adrenal glands are within normal limits. Kidneys are well visualized. No renal calculi or obstructive changes are noted. Renal cyst is noted in the upper pole on the left stable in appearance from multiple previous exams. Bladder is decompressed. Stomach/Bowel: Colon shows no obstructive or inflammatory changes. Appendix has been surgically removed. Stomach is decompressed. No sizeable small-bowel dilatation is seen. Previously seen small bowel dilatation secondary to an anterior abdominal wall hernia is no longer present. The hernia has been reduced with only minimal amount of ascites within the hernia sac. Vascular/Lymphatic: Aortic atherosclerosis. No enlarged abdominal or pelvic lymph nodes. Reproductive: Large irregularly calcified mass lesion is noted arising from the right adnexa. This is consistent with the patient's given clinical history of ovarian carcinoma. Multiple scattered mesenteric calcified masses are noted consistent with prior metastatic disease. The overall appearance is similar to that seen on prior CT examinations. The uterus and left adnexa appear within normal limits. Other: Moderate ascites is noted primarily surrounding the liver. The overall degree of ascites is improved from the prior CT from 11/28/2020. No free air is seen. Musculoskeletal: Right  hip replacement is noted. Degenerative changes of lumbar spine are seen. No lytic or sclerotic lesions are identified to suggest osseous metastatic disease. No compression deformities are noted. Mild anterolisthesis of L5 on S1 is again identified. Large lipoma is noted in the left lateral abdominal wall stable in appearance from previous exams. IMPRESSION: Moderate ascites although improved when compared with the prior exam of 11/28/2020. Heavily calcified right adnexal and pelvic mass consistent with the patient's known history of ovarian carcinoma. Multiple calcified mesenteric masses are again identified and stable. Previously seen small bowel dilatation has resolved following reduction of anterior abdominal wall hernia. The hernia contains only ascitic fluid at this time. Tiny bilateral pleural effusions. Electronically Signed   By: Inez Catalina M.D.   On: 12/06/2020 15:08   CT Head Wo Contrast  Result Date: 12/06/2020 CLINICAL DATA:  Delirium. EXAM: CT HEAD WITHOUT CONTRAST TECHNIQUE: Contiguous axial images were obtained from the base of the skull through the vertex without intravenous contrast. COMPARISON:  MRI October 18, 2019. FINDINGS: Motion limited evaluation.  Within this limitation: Brain: No evidence of acute large vascular territory infarction, hemorrhage, hydrocephalus, extra-axial collection or mass lesion/mass effect. Moderate white matter hypoattenuation, most likely related to chronic microvascular ischemic disease. Moderate atrophy with ex vacuo ventricular dilation, similar to prior. Vascular: No hyperdense vessel identified. Calcific atherosclerosis. Skull: No acute fracture. Sinuses/Orbits: Visualized sinuses are largely clear. Unremarkable orbits. Other: No mastoid effusions. IMPRESSION: 1. No evidence of acute intracranial abnormality on this motion limited exam. 2. Moderate chronic microvascular ischemic disease and atrophy. Electronically Signed   By: Margaretha Sheffield MD   On:  12/06/2020 09:58   DG Abdomen Acute W/Chest  Result Date: 12/06/2020 CLINICAL DATA:  Abdominal pain. EXAM: DG ABDOMEN ACUTE WITH 1 VIEW CHEST COMPARISON:  Most recent September 12, 2020. FINDINGS: There is gas within distal colon. Mildly dilated small bowel loops in the central abdomen (measuring approximately 3.2 cm on decubitus image). No evidence of free air. No evidence of nephrolithiasis, although overlying bowel gas significantly limits evaluation. Similar large calcified mass in the pelvis. Partially imaged right hip arthroplasty. Left hip and lumbar degenerative change. Cholecystectomy clips. Mild diffuse interstitial prominence, favored chronic. No consolidation. No visible pleural effusions or pneumothorax on the semi erect chest radiograph. Similar cardiomediastinal silhouette. Right reverse shoulder arthroplasty, partially imaged. Severe left shoulder degenerative change. IMPRESSION: 1. Mildly dilated small bowel loop in the central abdomen with gas in distal colon. Although indeterminate, findings could reflect a partial small bowel obstruction. CT abdomen/pelvis could further evaluate. 2. Similar large calcified mass in the pelvis, further evaluated on prior CT. Electronically Signed   By: Margaretha Sheffield MD   On: 12/06/2020 09:52      Flora Lipps, MD  [facility] 12/07/2020  If 7PM-7AM, please contact night-coverage

## 2020-12-07 NOTE — Progress Notes (Signed)
Was admitted to the unit from ED with wrist restraints on but this nurse removed them, as she was asleep and was calm when was awakened for her assessment.  She has been calm for the most part with an occasional yelling outburst, usually when wet.  No combative behavior.  Denies pain at this time and is confused and does chant her spouse's name at times.  No signs of distress.

## 2020-12-07 NOTE — Consult Note (Signed)
Palliative Medicine Inpatient Consult Note  Reason for consult:  Goals of Care  HPI:  Per intake H&P --> [name] is a 80 y.o. female with medical history significant of ovarian cancer on chemotherapy, atrial fibrillation, anemia, arthritis, and GERD who presented from home after being found to be acutely altered talking "gibberish".  Oncology has seen patient - she is no longer a candidate for ongoing therapy. They broached the topic of in home hospice which the family appeared interested in. Palliative care has been consulted to further discuss goals of care.  Clinical Assessment/Goals of Care:  *Please note that this is a verbal dictation therefore any spelling or grammatical errors are due to the "[place] [employer]" system interpretation.  I have reviewed medical records including EPIC notes, labs and imaging, received report from bedside RN, assessed the patient who is anxious and crawling out of the side of the bed. Reoriented patient and got her back into the bed. She is perseverating and not making sense.    I met with patients spouse, [name], his daughter, Erline Levine, and his daughter, [name] was called over speaker-phone to further discuss diagnosis prognosis, GOC, EOL wishes, disposition and options.   I introduced Palliative Medicine as specialized medical care for people living with serious illness. It focuses on providing relief from the symptoms and stress of a serious illness. The goal is to improve quality of life for both the patient and the family.  [name] is from [place] originally.  She met her spouse, [name] in [place] when she went to visit her brother who is in [employer] they were set up on a blind date and have since been together for the past 82 years.  They share together 2 daughters [name] and [name] as well as 3 grandchildren.  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  She is a woman of faith and practices  within the [employer] denomination.  Prior to hospitalization [name] had been getting "weaker" and requiring more assistance.  Nutritional state had been poor at home.  We reviewed [name]'s past medical history inclusive of her ovarian cancer and the impact that this is held on her life.  We discussed what Dr. Alvy Bimler had written in her note regarding the cessation of chemotherapy.  We discussed with the natural trajectory of life looks like when we are in its final phases.  Reviewed that nutritional needs decrease in patient's no longer have the desire to consume as they once had.  We reviewed that this is typically what occurs and offering artificial nutrition can sometimes cause more damage than benefit and that patients are not able to absorb the nutrition they are being provided causing things such as aspiration as well as significant edema.  Reviewed the symptoms associated with this including shortness of breath and potential pain.  A detailed discussion was had today regarding advanced directives - patient's spouse, [name] is her decision-maker.  Concepts specific to code status, artifical feeding and hydration, continued IV antibiotics and rehospitalization was had.  I provided a MOST form to [name] so that he may review it and think more about resuscitation.  [name] shares with me that [name] had previously said that she would want to be "revived" if it were offered.  I shared that this is a much more complex situation being that we cannot solve her underlying ovarian cancer and if we pursued a resuscitation event we will likely cause [name]'s body tremendous  trauma with little to no benefit.  He understood this though is having a difficult time coming to terms it appears with the prognosis of choice declining.  We discussed palliative care versus hospice care.  I shared that hospice care is preserved for patients and their final phases of life and its purpose is to maintain dignity and quality at the  end of life.  This is pursued through appropriate symptom management.  Reviewed that nursing and care aides come into the home to offer to support to family's and educate them on what to do in certain circumstances to better support their loved ones and being comfortable.  [name] remains to share with me that if [name] can "just get nutrition she will get stronger".  I again summarized that this may not have been and that if it does not it is okay as it is her body's natural process of completing this journey on life prior to starting the next one.  I asked [name] if I could meet with him again on Monday which she was in agreement with.  He is hoping that during this time he will have the ability to speak to his wife regarding what her wishes would be given the present circumstances.   Discussed the importance of continued conversation with family and their  medical providers regarding overall plan of care and treatment options, ensuring decisions are within the context of the patients values and GOCs.  Decision Maker:  SUMMARY OF RECOMMENDATIONS   Full Code / Full Scope of care - Strongly recommended DNR consideration  Provided MOST form for review  Plan to meet with patients spouse, [name] again on Monday  When patients family is ready will will proceed with providing additional hospice information through the hospice liaison  Ongoing PMT support  Code Status/Advance Care Planning: FULL CODE   Palliative Prophylaxis:  Oral Care, Delirium precautions  Additional Recommendations (Limitations, Scope, Preferences): Continue current scope of care  Psycho-social/Spiritual:  Desire for further Chaplaincy support: Yes - [facility] Additional Recommendations: Education on cancer progression   Prognosis: Exceptionally limited in the setting of ovarian CA now with FTT and terminal frailty. Hospice is now appropriate.   Discharge Planning: Unclear presently  Vitals:   12/07/20 0000  12/07/20 0314  BP: 124/70 136/63  Pulse: 68 77  Resp: 20 18  Temp: 98.4 F (36.9 C) 98.4 F (36.9 C)  SpO2: 98% 95%    Intake/Output Summary (Last 24 hours) at 12/07/2020 0645 Last data filed at 12/07/2020 0553 Gross per 24 hour  Intake 667.33 ml  Output --  Net 667.33 ml   Last Weight  Most recent update: 12/06/2020  9:41 PM    Weight  53.1 kg (117 lb 1 oz)            Gen:  Ill appearing elderly F  HEENT: Dry mucous membranes CV: Regular rate and irregular rhythm  PULM: On RA ABD: (+) Ascites EXT: No edema  Neuro: Disoriented  PPS: 10%   This conversation/these recommendations were discussed with patient primary care team, Dr. Louanne Belton  Time In: 1000 Time Out: 1110 Total Time: 70 Greater than 50%  of this time was spent counseling and coordinating care related to the above assessment and plan.  [facility] Team Team Cell Phone: [phone] Please utilize secure chat with additional questions, if there is no response within 30 minutes please call the above phone number  Palliative Medicine Team providers are available by phone from  7am to 7pm daily and can be reached through the team cell phone.  Should this patient require assistance outside of these hours, please call the patient's attending physician.

## 2020-12-07 NOTE — Progress Notes (Signed)
LILEY RAKE   DOB:[birth year]   OM#:[id number]    ASSESSMENT & PLAN:   Ovarian ca [facility]) Clinically, she continues to decline I have spent a lot of time over the past few months discussing goals of care with the patient and family Wilburn Mylar, her husband is somewhat receptive to the idea of palliative care with hospice Appreciated consult this morning I spoke with her daughter, [name] for another 34 minutes Both [name] and [name] are on board with end-of-life care but not her husband I have discontinued MRI brain order and [name] agrees I have discontinued IV fluids order and [name] agrees I recommend minimum work-up moving forward    Acute delirium Could be due to side effects of medication as well as dehydration or electrolyte changes due to rapid fluid shift from recent paracentesis It does not appear she has signs of infection She is intermittently lucid I do not recommend MRI of the brain; she will not likely as tolerated and will not benefit from it    Malignant ascites She has recurrent malignant ascites on exam She is not symptomatic I do not recommend IV fluid as it will cause recurrent ascites and discomfort   weight loss, abnormal She continues to have significant weight loss This is due to malignant cachexia She has no benefit from mirtazapine or dexamethasone I explained to the patient's family the rationale of not pursuing placement of feeding tube    Elevated liver enzymes She has abnormal elevated liver enzymes likely due to side effects of chemo   Goals of care discussion On June 10, I spent almost 1 hour over the phone explaining clinical findings to the patient's husband and 2 daughters Today, I spent another 30 minutes talking to Ihor Gully and her sister are in agreement with palliative care Her husband, [name], is not 100% totally convinced about stopping everything He is still asking around for way to feed the patient and improve her nutritional  status Palliative care will continue to work with him and family At this point, given her significant delirium, I felt that it is most appropriate to recommend residential hospice facility placement [name] appears to be interested with this option and will discuss further with family   Discharge planning Not ready for discharge I will follow this weekend  All questions were answered. The patient knows to call the clinic with any problems, questions or concerns.   The total time spent in the appointment was 40 minutes encounter with patients including review of chart and various tests results, discussions about plan of care and coordination of care plan  [name], MD 12/07/2020 1:06 PM  Subjective:  The patient is in her room by herself with nurse by the bedside She continues to have delirium overnight Just now, she appears to recognize me; she could not find appropriate words to describe that she is in the hospital.  She stated her family was present.  When I ask who visited her, she stated "my mom" When I ask whether she is in pain, she say no 10 seconds later, she stated she is in pain She was started on 5% dextrose due to recent hypoglycemia  Objective:  Vitals:   12/07/20 0800 12/07/20 1100  BP: (!) 108/54 (!) 129/91  Pulse: 67 72  Resp: 19 19  Temp: 97.6 F (36.4 C) 97.9 F (36.6 C)  SpO2: 95% 98%     Intake/Output Summary (Last 24 hours) at 12/07/2020 1306 Last data filed at 12/07/2020 [phone]  Gross per 24 hour  Intake 667.33 ml  Output --  Net 667.33 ml    GENERAL:alert but not fully oriented  Labs:  Recent Labs    12/02/20 2204 12/06/20 0822 12/07/20 0226  NA 131* 135 134*  K 3.5 4.3 3.6  CL 95* 97* 98  CO2 27 27 24   GLUCOSE 132* 129* 83  BUN 44* 24* 18  CREATININE 1.07* 1.05* 0.77  CALCIUM 8.0* 8.2* 7.9*  GFRNONAA 52* 53* >60  PROT 5.3* 5.1* 4.9*  ALBUMIN 2.5* 2.5* 2.4*  AST 27 41 28  ALT 51* 68* 53*  ALKPHOS 233* 221* 206*  BILITOT 0.5 0.6 0.7     Studies:  CT ABDOMEN PELVIS WO CONTRAST  Result Date: 12/06/2020 CLINICAL DATA:  Abdominal pain and distension EXAM: CT ABDOMEN AND PELVIS WITHOUT CONTRAST TECHNIQUE: Multidetector CT imaging of the abdomen and pelvis was performed following the standard protocol without IV contrast. COMPARISON:  Plain film from earlier in the same day, CT from 09/11/2020 and 11/28/2020 FINDINGS: Lower chest: Tiny effusions are noted bilaterally. No focal infiltrate or sizable nodule is seen. Hepatobiliary: No focal liver abnormality is seen. Status post cholecystectomy. No biliary dilatation. Pancreas: Unremarkable. No pancreatic ductal dilatation or surrounding inflammatory changes. Spleen: Normal in size without focal abnormality. Adrenals/Urinary Tract: Adrenal glands are within normal limits. Kidneys are well visualized. No renal calculi or obstructive changes are noted. Renal cyst is noted in the upper pole on the left stable in appearance from multiple previous exams. Bladder is decompressed. Stomach/Bowel: Colon shows no obstructive or inflammatory changes. Appendix has been surgically removed. Stomach is decompressed. No sizeable small-bowel dilatation is seen. Previously seen small bowel dilatation secondary to an anterior abdominal wall hernia is no longer present. The hernia has been reduced with only minimal amount of ascites within the hernia sac. Vascular/Lymphatic: Aortic atherosclerosis. No enlarged abdominal or pelvic lymph nodes. Reproductive: Large irregularly calcified mass lesion is noted arising from the right adnexa. This is consistent with the patient's given clinical history of ovarian carcinoma. Multiple scattered mesenteric calcified masses are noted consistent with prior metastatic disease. The overall appearance is similar to that seen on prior CT examinations. The uterus and left adnexa appear within normal limits. Other: Moderate ascites is noted primarily surrounding the liver. The overall  degree of ascites is improved from the prior CT from 11/28/2020. No free air is seen. Musculoskeletal: Right hip replacement is noted. Degenerative changes of lumbar spine are seen. No lytic or sclerotic lesions are identified to suggest osseous metastatic disease. No compression deformities are noted. Mild anterolisthesis of L5 on S1 is again identified. Large lipoma is noted in the left lateral abdominal wall stable in appearance from previous exams. IMPRESSION: Moderate ascites although improved when compared with the prior exam of 11/28/2020. Heavily calcified right adnexal and pelvic mass consistent with the patient's known history of ovarian carcinoma. Multiple calcified mesenteric masses are again identified and stable. Previously seen small bowel dilatation has resolved following reduction of anterior abdominal wall hernia. The hernia contains only ascitic fluid at this time. Tiny bilateral pleural effusions. Electronically Signed   By: Inez Catalina M.D.   On: 12/06/2020 15:08   CT Head Wo Contrast  Result Date: 12/06/2020 CLINICAL DATA:  Delirium. EXAM: CT HEAD WITHOUT CONTRAST TECHNIQUE: Contiguous axial images were obtained from the base of the skull through the vertex without intravenous contrast. COMPARISON:  MRI October 18, 2019. FINDINGS: Motion limited evaluation.  Within this limitation: Brain: No evidence of acute  large vascular territory infarction, hemorrhage, hydrocephalus, extra-axial collection or mass lesion/mass effect. Moderate white matter hypoattenuation, most likely related to chronic microvascular ischemic disease. Moderate atrophy with ex vacuo ventricular dilation, similar to prior. Vascular: No hyperdense vessel identified. Calcific atherosclerosis. Skull: No acute fracture. Sinuses/Orbits: Visualized sinuses are largely clear. Unremarkable orbits. Other: No mastoid effusions. IMPRESSION: 1. No evidence of acute intracranial abnormality on this motion limited exam. 2. Moderate  chronic microvascular ischemic disease and atrophy. Electronically Signed   By: Margaretha Sheffield MD   On: 12/06/2020 09:58   DG Abdomen Acute W/Chest  Result Date: 12/06/2020 CLINICAL DATA:  Abdominal pain. EXAM: DG ABDOMEN ACUTE WITH 1 VIEW CHEST COMPARISON:  Most recent September 12, 2020. FINDINGS: There is gas within distal colon. Mildly dilated small bowel loops in the central abdomen (measuring approximately 3.2 cm on decubitus image). No evidence of free air. No evidence of nephrolithiasis, although overlying bowel gas significantly limits evaluation. Similar large calcified mass in the pelvis. Partially imaged right hip arthroplasty. Left hip and lumbar degenerative change. Cholecystectomy clips. Mild diffuse interstitial prominence, favored chronic. No consolidation. No visible pleural effusions or pneumothorax on the semi erect chest radiograph. Similar cardiomediastinal silhouette. Right reverse shoulder arthroplasty, partially imaged. Severe left shoulder degenerative change. IMPRESSION: 1. Mildly dilated small bowel loop in the central abdomen with gas in distal colon. Although indeterminate, findings could reflect a partial small bowel obstruction. CT abdomen/pelvis could further evaluate. 2. Similar large calcified mass in the pelvis, further evaluated on prior CT. Electronically Signed   By: Margaretha Sheffield MD   On: 12/06/2020 09:52   IR Paracentesis  Result Date: 11/19/2020 INDICATION: History of ovarian cancer with recurrent malignant ascites. Request for therapeutic paracentesis up to 5 L. EXAM: ULTRASOUND GUIDED  PARACENTESIS MEDICATIONS: 10 mL 1% lidocaine COMPLICATIONS: None immediate. PROCEDURE: Informed written consent was obtained from the patient after a discussion of the risks, benefits and alternatives to treatment. A timeout was performed prior to the initiation of the procedure. Initial ultrasound scanning demonstrates a large amount of ascites within the right lower abdominal  quadrant. The right lower abdomen was prepped and draped in the usual sterile fashion. 1% lidocaine was used for local anesthesia. Following this, a 19 gauge, 7-cm, Yueh catheter was introduced. An ultrasound image was saved for documentation purposes. The paracentesis was performed. The catheter was removed and a dressing was applied. The patient tolerated the procedure well without immediate post procedural complication. FINDINGS: A total of approximately 5 L of clear yellow fluid was removed. IMPRESSION: Successful ultrasound-guided paracentesis yielding 5 liters of peritoneal fluid. Read by: Durenda Guthrie, PA-C Electronically Signed   By: Corrie Mckusick D.O.   On: 11/19/2020 15:09

## 2020-12-08 DIAGNOSIS — R627 Adult failure to thrive: Secondary | ICD-10-CM

## 2020-12-08 DIAGNOSIS — Z66 Do not resuscitate: Secondary | ICD-10-CM

## 2020-12-08 DIAGNOSIS — D638 Anemia in other chronic diseases classified elsewhere: Secondary | ICD-10-CM

## 2020-12-08 LAB — GLUCOSE, CAPILLARY
Glucose-Capillary: 111 mg/dL — ABNORMAL HIGH (ref 70–99)
Glucose-Capillary: 111 mg/dL — ABNORMAL HIGH (ref 70–99)
Glucose-Capillary: 112 mg/dL — ABNORMAL HIGH (ref 70–99)
Glucose-Capillary: 74 mg/dL (ref 70–99)
Glucose-Capillary: 75 mg/dL (ref 70–99)

## 2020-12-08 MED ORDER — GLYCOPYRROLATE 0.2 MG/ML IJ SOLN: 0.2000 mg | INTRAMUSCULAR | Status: DC | PRN

## 2020-12-08 MED ORDER — CHLORHEXIDINE GLUCONATE CLOTH 2 % EX PADS
6.0000 | MEDICATED_PAD | Freq: Every day | CUTANEOUS | Status: DC
  Administered 2020-12-08 – 2020-12-09 (×2): 6 via TOPICAL

## 2020-12-08 MED ORDER — BIOTENE DRY MOUTH MT LIQD: 15.0000 mL | OROMUCOSAL | Status: DC | PRN

## 2020-12-08 MED ORDER — ACETAMINOPHEN 325 MG PO TABS: 650.0000 mg | ORAL_TABLET | Freq: Four times a day (QID) | ORAL | Status: DC | PRN

## 2020-12-08 MED ORDER — HYDROXYZINE HCL 10 MG PO TABS
10.0000 mg | ORAL_TABLET | Freq: Three times a day (TID) | ORAL | Status: DC | PRN
  Administered 2020-12-08: 10 mg via ORAL
  Filled 2020-12-08 (×2): qty 1

## 2020-12-08 MED ORDER — ONDANSETRON HCL 4 MG/2ML IJ SOLN
4.0000 mg | Freq: Four times a day (QID) | INTRAMUSCULAR | Status: DC | PRN
Start: 2020-12-08 — End: 2020-12-10

## 2020-12-08 MED ORDER — ACETAMINOPHEN 650 MG RE SUPP: 650.0000 mg | Freq: Four times a day (QID) | RECTAL | Status: DC | PRN

## 2020-12-08 MED ORDER — POLYVINYL ALCOHOL 1.4 % OP SOLN
1.0000 [drp] | Freq: Four times a day (QID) | OPHTHALMIC | Status: DC | PRN
  Filled 2020-12-08: qty 15

## 2020-12-08 MED ORDER — GLYCOPYRROLATE 0.2 MG/ML IJ SOLN
0.2000 mg | INTRAMUSCULAR | Status: DC | PRN
Start: 2020-12-08 — End: 2020-12-10

## 2020-12-08 MED ORDER — HALOPERIDOL 0.5 MG PO TABS
0.5000 mg | ORAL_TABLET | ORAL | Status: DC | PRN
Start: 2020-12-08 — End: 2020-12-10

## 2020-12-08 MED ORDER — HALOPERIDOL LACTATE 5 MG/ML IJ SOLN
0.5000 mg | INTRAMUSCULAR | Status: DC | PRN
  Administered 2020-12-09 (×2): 0.5 mg via INTRAVENOUS
  Filled 2020-12-08 (×2): qty 1

## 2020-12-08 MED ORDER — GLYCOPYRROLATE 1 MG PO TABS
1.0000 mg | ORAL_TABLET | ORAL | Status: DC | PRN
  Filled 2020-12-08: qty 1

## 2020-12-08 MED ORDER — ONDANSETRON 4 MG PO TBDP: 4.0000 mg | ORAL_TABLET | Freq: Four times a day (QID) | ORAL | Status: DC | PRN

## 2020-12-08 MED ORDER — HALOPERIDOL LACTATE 2 MG/ML PO CONC
0.5000 mg | ORAL | Status: DC | PRN
  Filled 2020-12-08: qty 0.3

## 2020-12-08 NOTE — Progress Notes (Signed)
PROGRESS NOTE    [name]  TDD:[id number] DOB: [birth year] DOA: 12/06/2020 PCP: [name], MD    Chief Complaint  Patient presents with   Altered Mental Status    Brief Narrative:  81 y.o. female with medical history significant of ovarian cancer on chemotherapy, atrial fibrillation, anemia, arthritis, and GERD  presented from home for acute encephalopathy.   Patient was recently hospitalized at [facility] from 6/2-6/4 after presenting with abdominal pain found to have concern for small bowel obstruction on CT.  During the hospitalization patient had received a paracentesis which removed approximately 4 L of fluid with improvement in patient's abdominal pain.  CT scan of the brain showed no acute abnormalities.  Abdominal x-ray was significant for mildly dilated loops of bowel and a large calcified mass in the pelvis. Oncology and palliative care consulted.    Assessment & Plan:   Principal Problem:   Acute metabolic encephalopathy Active Problems:   Malignant ascites   Ovarian ca (HCC)   Elevated LFTs   Anemia due to chronic illness   Severe protein-calorie malnutrition (HCC)   Failure to thrive in adult   Acute metabolic encephalopathy: Probably sec to dehydration and polypharmarcy.  Pt is alert , able to answer some simple questions, but not back to baseline.  CT head without contrast does not show any abnormalities.  MRI brain discontinued as it would not benefit her .     Ovarian cancer with malignant ascites:  Oncology on board.  Palliative care on board.  Extensive discussions with oncology and palliative and transition to comfort measures.  Plan for residential hospice placement as per family request.     Dehydration:  Gently hydrated and IV fluids discontinued.     Failure to thrive / Severe protein calorie malnutrition/ Poor intake:  Goals of care discussions going on.     Anemia of chronic disease:  No evidence of bleeding.     Deconditioning:  Due to advanced cancer and severe malnutrition.     DVT prophylaxis: Lovenox.  Code Status: (full code.  Family Communication: none at bedside Disposition:   Status is: Inpatient  Remains inpatient appropriate because:Unsafe d/c plan  Dispo: The patient is from: Home              Anticipated d/c is to:  residential hospice.              Patient currently is not medically stable to d/c.   Difficult to place patient No       Consultants:  Oncology  Palliative care  Procedures: None.  Antimicrobials: None.   Subjective: Still nauseated, very weak  Objective: Vitals:   12/07/20 1542 12/08/20 0011 12/08/20 0400 12/08/20 0856  BP: (!) 107/56 (!) 141/58 (!) 144/63 136/61  Pulse: 63 61 78 79  Resp: 17 18 19 20   Temp: 98.2 F (36.8 C) 97.7 F (36.5 C) 98 F (36.7 C) 98 F (36.7 C)  TempSrc: Axillary  Oral Oral  SpO2: 96% 100% 100% 99%  Weight:      Height:        Intake/Output Summary (Last 24 hours) at 12/08/2020 1017 Last data filed at 12/07/2020 2000 Gross per 24 hour  Intake 522 ml  Output --  Net 522 ml   Filed Weights   12/06/20 2000  Weight: 53.1 kg    Examination:  General exam: Appears calm and comfortable  Respiratory system: Clear to auscultation. Respiratory effort normal. Cardiovascular system: S1 & S2 heard,  RRR. No JVD,  No pedal edema. Gastrointestinal system: Abdomen is nondistended, soft and nontender. Normal bowel sounds heard. Central nervous system: Alert and oriented. No focal neurological deficits. Extremities: Symmetric 5 x 5 power. Skin: No rashes, lesions or ulcers Psychiatry: Mood & affect appropriate.     Data Reviewed: I have personally reviewed following labs and imaging studies  CBC: Recent Labs  Lab 12/02/20 2204 12/06/20 0822 12/07/20 0226  WBC 7.0 9.8 8.0  NEUTROABS 6.4 6.8  --   HGB 10.7* 11.7* 12.6  HCT 33.0* 35.8* 38.2  MCV 88.9 89.5 88.0  PLT 291 232 335    Basic Metabolic  Panel: Recent Labs  Lab 12/02/20 2204 12/06/20 0822 12/07/20 0226  NA 131* 135 134*  K 3.5 4.3 3.6  CL 95* 97* 98  CO2 27 27 24   GLUCOSE 132* 129* 83  BUN 44* 24* 18  CREATININE 1.07* 1.05* 0.77  CALCIUM 8.0* 8.2* 7.9*    GFR: Estimated Creatinine Clearance: 41.6 mL/min (by C-G formula based on SCr of 0.77 mg/dL).  Liver Function Tests: Recent Labs  Lab 12/02/20 2204 12/06/20 0822 12/07/20 0226  AST 27 41 28  ALT 51* 68* 53*  ALKPHOS 233* 221* 206*  BILITOT 0.5 0.6 0.7  PROT 5.3* 5.1* 4.9*  ALBUMIN 2.5* 2.5* 2.4*    CBG: Recent Labs  Lab 12/07/20 1957 12/08/20 0014 12/08/20 0129 12/08/20 0427 12/08/20 0730  GLUCAP 93 74 75 111* 112*     Recent Results (from the past 240 hour(s))  Resp Panel by RT-PCR (Flu A&B, Covid) Urine, Clean Catch     Status: None   Collection Time: 12/02/20 11:16 PM   Specimen: Urine, Clean Catch; Nasopharyngeal(NP) swabs in vial transport medium  Result Value Ref Range Status   SARS Coronavirus 2 by RT PCR NEGATIVE NEGATIVE Final    Comment: (NOTE) SARS-CoV-2 target nucleic acids are NOT DETECTED.  The SARS-CoV-2 RNA is generally detectable in upper respiratory specimens during the acute phase of infection. The lowest concentration of SARS-CoV-2 viral copies this assay can detect is 138 copies/mL. A negative result does not preclude SARS-Cov-2 infection and should not be used as the sole basis for treatment or other patient management decisions. A negative result may occur with  improper specimen collection/handling, submission of specimen other than nasopharyngeal swab, presence of viral mutation(s) within the areas targeted by this assay, and inadequate number of viral copies(<138 copies/mL). A negative result must be combined with clinical observations, patient history, and epidemiological information. The expected result is Negative.  Fact Sheet for Patients:  EntrepreneurPulse.com.au  Fact Sheet for  Healthcare Providers:  IncredibleEmployment.be  This test is no t yet approved or cleared by the [place] FDA and  has been authorized for detection and/or diagnosis of SARS-CoV-2 by FDA under an Emergency Use Authorization (EUA). This EUA will remain  in effect (meaning this test can be used) for the duration of the COVID-19 declaration under Section 564(b)(1) of the Act, 21 U.S.C.section 360bbb-3(b)(1), unless the authorization is terminated  or revoked sooner.       Influenza A by PCR NEGATIVE NEGATIVE Final   Influenza B by PCR NEGATIVE NEGATIVE Final    Comment: (NOTE) The Xpert Xpress SARS-CoV-2/FLU/RSV plus assay is intended as an aid in the diagnosis of influenza from Nasopharyngeal swab specimens and should not be used as a sole basis for treatment. Nasal washings and aspirates are unacceptable for Xpert Xpress SARS-CoV-2/FLU/RSV testing.  Fact Sheet for Patients: EntrepreneurPulse.com.au  Fact Sheet  for Healthcare Providers: IncredibleEmployment.be  This test is not yet approved or cleared by the [place] and has been authorized for detection and/or diagnosis of SARS-CoV-2 by FDA under an Emergency Use Authorization (EUA). This EUA will remain in effect (meaning this test can be used) for the duration of the COVID-19 declaration under Section 564(b)(1) of the Act, 21 U.S.C. section 360bbb-3(b)(1), unless the authorization is terminated or revoked.  Performed at [facility], [place] [address], [place], [place] [address]   Resp Panel by RT-PCR (Flu A&B, Covid) Nasopharyngeal Swab     Status: None   Collection Time: 12/06/20  3:03 PM   Specimen: Nasopharyngeal Swab; Nasopharyngeal(NP) swabs in vial transport medium  Result Value Ref Range Status   SARS Coronavirus 2 by RT PCR NEGATIVE NEGATIVE Final    Comment: (NOTE) SARS-CoV-2 target nucleic acids are NOT DETECTED.  The  SARS-CoV-2 RNA is generally detectable in upper respiratory specimens during the acute phase of infection. The lowest concentration of SARS-CoV-2 viral copies this assay can detect is 138 copies/mL. A negative result does not preclude SARS-Cov-2 infection and should not be used as the sole basis for treatment or other patient management decisions. A negative result may occur with  improper specimen collection/handling, submission of specimen other than nasopharyngeal swab, presence of viral mutation(s) within the areas targeted by this assay, and inadequate number of viral copies(<138 copies/mL). A negative result must be combined with clinical observations, patient history, and epidemiological information. The expected result is Negative.  Fact Sheet for Patients:  EntrepreneurPulse.com.au  Fact Sheet for Healthcare Providers:  IncredibleEmployment.be  This test is no t yet approved or cleared by the [place] FDA and  has been authorized for detection and/or diagnosis of SARS-CoV-2 by FDA under an Emergency Use Authorization (EUA). This EUA will remain  in effect (meaning this test can be used) for the duration of the COVID-19 declaration under Section 564(b)(1) of the Act, 21 U.S.C.section 360bbb-3(b)(1), unless the authorization is terminated  or revoked sooner.       Influenza A by PCR NEGATIVE NEGATIVE Final   Influenza B by PCR NEGATIVE NEGATIVE Final    Comment: (NOTE) The Xpert Xpress SARS-CoV-2/FLU/RSV plus assay is intended as an aid in the diagnosis of influenza from Nasopharyngeal swab specimens and should not be used as a sole basis for treatment. Nasal washings and aspirates are unacceptable for Xpert Xpress SARS-CoV-2/FLU/RSV testing.  Fact Sheet for Patients: EntrepreneurPulse.com.au  Fact Sheet for Healthcare Providers: IncredibleEmployment.be  This test is not yet approved or  cleared by the [place] FDA and has been authorized for detection and/or diagnosis of SARS-CoV-2 by FDA under an Emergency Use Authorization (EUA). This EUA will remain in effect (meaning this test can be used) for the duration of the COVID-19 declaration under Section 564(b)(1) of the Act, 21 U.S.C. section 360bbb-3(b)(1), unless the authorization is terminated or revoked.  Performed at [facility], [place] [address], [place], [place] [address]          Radiology Studies: CT ABDOMEN PELVIS WO CONTRAST  Result Date: 12/06/2020 CLINICAL DATA:  Abdominal pain and distension EXAM: CT ABDOMEN AND PELVIS WITHOUT CONTRAST TECHNIQUE: Multidetector CT imaging of the abdomen and pelvis was performed following the standard protocol without IV contrast. COMPARISON:  Plain film from earlier in the same day, CT from 09/11/2020 and 11/28/2020 FINDINGS: Lower chest: Tiny effusions are noted bilaterally. No focal infiltrate or sizable nodule is seen. Hepatobiliary: No focal liver  abnormality is seen. Status post cholecystectomy. No biliary dilatation. Pancreas: Unremarkable. No pancreatic ductal dilatation or surrounding inflammatory changes. Spleen: Normal in size without focal abnormality. Adrenals/Urinary Tract: Adrenal glands are within normal limits. Kidneys are well visualized. No renal calculi or obstructive changes are noted. Renal cyst is noted in the upper pole on the left stable in appearance from multiple previous exams. Bladder is decompressed. Stomach/Bowel: Colon shows no obstructive or inflammatory changes. Appendix has been surgically removed. Stomach is decompressed. No sizeable small-bowel dilatation is seen. Previously seen small bowel dilatation secondary to an anterior abdominal wall hernia is no longer present. The hernia has been reduced with only minimal amount of ascites within the hernia sac. Vascular/Lymphatic: Aortic atherosclerosis. No enlarged abdominal or pelvic lymph  nodes. Reproductive: Large irregularly calcified mass lesion is noted arising from the right adnexa. This is consistent with the patient's given clinical history of ovarian carcinoma. Multiple scattered mesenteric calcified masses are noted consistent with prior metastatic disease. The overall appearance is similar to that seen on prior CT examinations. The uterus and left adnexa appear within normal limits. Other: Moderate ascites is noted primarily surrounding the liver. The overall degree of ascites is improved from the prior CT from 11/28/2020. No free air is seen. Musculoskeletal: Right hip replacement is noted. Degenerative changes of lumbar spine are seen. No lytic or sclerotic lesions are identified to suggest osseous metastatic disease. No compression deformities are noted. Mild anterolisthesis of L5 on S1 is again identified. Large lipoma is noted in the left lateral abdominal wall stable in appearance from previous exams. IMPRESSION: Moderate ascites although improved when compared with the prior exam of 11/28/2020. Heavily calcified right adnexal and pelvic mass consistent with the patient's known history of ovarian carcinoma. Multiple calcified mesenteric masses are again identified and stable. Previously seen small bowel dilatation has resolved following reduction of anterior abdominal wall hernia. The hernia contains only ascitic fluid at this time. Tiny bilateral pleural effusions. Electronically Signed   By: Inez Catalina M.D.   On: 12/06/2020 15:08        Scheduled Meds:  enoxaparin (LOVENOX) injection  40 mg Subcutaneous Daily   feeding supplement  237 mL Oral BID BM   mirtazapine  15 mg Oral QHS   sodium chloride flush  3 mL Intravenous Q12H   Continuous Infusions:  promethazine (PHENERGAN) injection (IM or IVPB)       LOS: 2 days        Hosie Poisson, MD [facility]   To contact the attending provider between 7A-7P or the covering provider during after hours  7P-7A, please log into the web site [email or web address] and access using universal [place] password for that web site. If you do not have the password, please call the hospital operator.  12/08/2020, 10:17 AM

## 2020-12-08 NOTE — Plan of Care (Signed)

## 2020-12-08 NOTE — TOC Progression Note (Signed)
Transition of Care [facility]) - Progression Note    Patient Details  Name: [name] MRN: [id number] Date of Birth: October 18, 1940  [facility] CM/SW [place], [place] Phone Number: 12/08/2020, 1:06 PM  Clinical Narrative:     CSW was notified by Palliative that pt's family is interested in [facility] for inpatient Hospice. CSW confirmed with pt's family and answered as many questions as able. CSW made referral to [facility], who will reach out to family when she is able. TOC will continue to follow for DC needs.       Expected Discharge Plan and Services                                                 Social Determinants of Health (SDOH) Interventions    Readmission Risk Interventions Readmission Risk Prevention Plan 09/11/2020  Transportation Screening Complete  PCP or Specialist Appt within 3-5 Days Complete  HRI or [place] Complete  Social Work Consult for [place] Planning/Counseling Complete  Palliative Care Screening Not Applicable  Medication Review Press photographer) Complete  Some recent data might be hidden

## 2020-12-08 NOTE — Progress Notes (Signed)
Addendum: Spent 1 hour with patient and family today

## 2020-12-08 NOTE — Progress Notes (Signed)
[name]   DOB:Sep 06, 1940   OM#:[id number]    ASSESSMENT & PLAN:   Ovar[facility]) Clinically, she continues to decline I have spent a lot of time over the past few months discussing goals of care with the patient and family Yesterday, her husband is somewhat receptive to the idea of palliative care with hospice We have another long discussion today in the presence of the patient's husband, 2 daughters and palliative care team  Ultimately, we are in agreement to focus on comfort measures The patient's family is interested for residential hospice placement I would defer further discussion to the palliative care service to explain further to the family I will initiate comfort measures set and change her CODE STATUS to DNR  acute delirium Could be due to side effects of medication as well as dehydration or electrolyte changes due to rapid fluid shift from recent paracentesis It does not appear she has signs of infection She is intermittently lucid I do not recommend MRI of the brain; she will not likely as tolerated and will not benefit from it After much discussion, were in agreement to stop further intervention and work-up We will initiate comfort measures    Malignant ascites She has recurrent malignant ascites on exam She is not symptomatic; she does not need paracentesis I do not recommend IV fluid as it will cause recurrent ascites and discomfort   weight loss, abnormal She continues to have significant weight loss This is due to malignant cachexia She has no benefit from mirtazapine or dexamethasone I explained to the patient's family the rationale of not pursuing placement of feeding tube    Goals of care discussion We are in agreement to initiate comfort measures  The family is leaning towards residential hospice facility I estimate prognosis to be less than 14 days  discharge planning She will be ready for discharge as soon as hospice services can be  arranged Please call if questions arise  All questions were answered.   [name], MD 12/08/2020 10:40 AM  Subjective:  Her husband is by the bedside The patient is alert and cooperative but she is not oriented in place and time She denies pain or symptoms  Objective:  Vitals:   12/08/20 0400 12/08/20 0856  BP: (!) 144/63 136/61  Pulse: 78 79  Resp: 19 20  Temp: 98 F (36.7 C) 98 F (36.7 C)  SpO2: 100% 99%     Intake/Output Summary (Last 24 hours) at 12/08/2020 1040 Last data filed at 12/07/2020 2000 Gross per 24 hour  Intake 522 ml  Output --  Net 522 ml    GENERAL:alert, no distress and comfortable   Labs:  Recent Labs    12/02/20 2204 12/06/20 0822 12/07/20 0226  NA 131* 135 134*  K 3.5 4.3 3.6  CL 95* 97* 98  CO2 27 27 24   GLUCOSE 132* 129* 83  BUN 44* 24* 18  CREATININE 1.07* 1.05* 0.77  CALCIUM 8.0* 8.2* 7.9*  GFRNONAA 52* 53* >60  PROT 5.3* 5.1* 4.9*  ALBUMIN 2.5* 2.5* 2.4*  AST 27 41 28  ALT 51* 68* 53*  ALKPHOS 233* 221* 206*  BILITOT 0.5 0.6 0.7    Studies:  CT ABDOMEN PELVIS WO CONTRAST  Result Date: 12/06/2020 CLINICAL DATA:  Abdominal pain and distension EXAM: CT ABDOMEN AND PELVIS WITHOUT CONTRAST TECHNIQUE: Multidetector CT imaging of the abdomen and pelvis was performed following the standard protocol without IV contrast. COMPARISON:  Plain film from earlier in the  same day, CT from 09/11/2020 and 11/28/2020 FINDINGS: Lower chest: Tiny effusions are noted bilaterally. No focal infiltrate or sizable nodule is seen. Hepatobiliary: No focal liver abnormality is seen. Status post cholecystectomy. No biliary dilatation. Pancreas: Unremarkable. No pancreatic ductal dilatation or surrounding inflammatory changes. Spleen: Normal in size without focal abnormality. Adrenals/Urinary Tract: Adrenal glands are within normal limits. Kidneys are well visualized. No renal calculi or obstructive changes are noted. Renal cyst is noted in the upper pole on the  left stable in appearance from multiple previous exams. Bladder is decompressed. Stomach/Bowel: Colon shows no obstructive or inflammatory changes. Appendix has been surgically removed. Stomach is decompressed. No sizeable small-bowel dilatation is seen. Previously seen small bowel dilatation secondary to an anterior abdominal wall hernia is no longer present. The hernia has been reduced with only minimal amount of ascites within the hernia sac. Vascular/Lymphatic: Aortic atherosclerosis. No enlarged abdominal or pelvic lymph nodes. Reproductive: Large irregularly calcified mass lesion is noted arising from the right adnexa. This is consistent with the patient's given clinical history of ovarian carcinoma. Multiple scattered mesenteric calcified masses are noted consistent with prior metastatic disease. The overall appearance is similar to that seen on prior CT examinations. The uterus and left adnexa appear within normal limits. Other: Moderate ascites is noted primarily surrounding the liver. The overall degree of ascites is improved from the prior CT from 11/28/2020. No free air is seen. Musculoskeletal: Right hip replacement is noted. Degenerative changes of lumbar spine are seen. No lytic or sclerotic lesions are identified to suggest osseous metastatic disease. No compression deformities are noted. Mild anterolisthesis of L5 on S1 is again identified. Large lipoma is noted in the left lateral abdominal wall stable in appearance from previous exams. IMPRESSION: Moderate ascites although improved when compared with the prior exam of 11/28/2020. Heavily calcified right adnexal and pelvic mass consistent with the patient's known history of ovarian carcinoma. Multiple calcified mesenteric masses are again identified and stable. Previously seen small bowel dilatation has resolved following reduction of anterior abdominal wall hernia. The hernia contains only ascitic fluid at this time. Tiny bilateral pleural  effusions. Electronically Signed   By: Inez Catalina M.D.   On: 12/06/2020 15:08   CT Head Wo Contrast  Result Date: 12/06/2020 CLINICAL DATA:  Delirium. EXAM: CT HEAD WITHOUT CONTRAST TECHNIQUE: Contiguous axial images were obtained from the base of the skull through the vertex without intravenous contrast. COMPARISON:  MRI October 18, 2019. FINDINGS: Motion limited evaluation.  Within this limitation: Brain: No evidence of acute large vascular territory infarction, hemorrhage, hydrocephalus, extra-axial collection or mass lesion/mass effect. Moderate white matter hypoattenuation, most likely related to chronic microvascular ischemic disease. Moderate atrophy with ex vacuo ventricular dilation, similar to prior. Vascular: No hyperdense vessel identified. Calcific atherosclerosis. Skull: No acute fracture. Sinuses/Orbits: Visualized sinuses are largely clear. Unremarkable orbits. Other: No mastoid effusions. IMPRESSION: 1. No evidence of acute intracranial abnormality on this motion limited exam. 2. Moderate chronic microvascular ischemic disease and atrophy. Electronically Signed   By: Margaretha Sheffield MD   On: 12/06/2020 09:58   DG Abdomen Acute W/Chest  Result Date: 12/06/2020 CLINICAL DATA:  Abdominal pain. EXAM: DG ABDOMEN ACUTE WITH 1 VIEW CHEST COMPARISON:  Most recent September 12, 2020. FINDINGS: There is gas within distal colon. Mildly dilated small bowel loops in the central abdomen (measuring approximately 3.2 cm on decubitus image). No evidence of free air. No evidence of nephrolithiasis, although overlying bowel gas significantly limits evaluation. Similar large calcified mass in  the pelvis. Partially imaged right hip arthroplasty. Left hip and lumbar degenerative change. Cholecystectomy clips. Mild diffuse interstitial prominence, favored chronic. No consolidation. No visible pleural effusions or pneumothorax on the semi erect chest radiograph. Similar cardiomediastinal silhouette. Right reverse  shoulder arthroplasty, partially imaged. Severe left shoulder degenerative change. IMPRESSION: 1. Mildly dilated small bowel loop in the central abdomen with gas in distal colon. Although indeterminate, findings could reflect a partial small bowel obstruction. CT abdomen/pelvis could further evaluate. 2. Similar large calcified mass in the pelvis, further evaluated on prior CT. Electronically Signed   By: Margaretha Sheffield MD   On: 12/06/2020 09:52   IR Paracentesis  Result Date: 11/19/2020 INDICATION: History of ovarian cancer with recurrent malignant ascites. Request for therapeutic paracentesis up to 5 L. EXAM: ULTRASOUND GUIDED  PARACENTESIS MEDICATIONS: 10 mL 1% lidocaine COMPLICATIONS: None immediate. PROCEDURE: Informed written consent was obtained from the patient after a discussion of the risks, benefits and alternatives to treatment. A timeout was performed prior to the initiation of the procedure. Initial ultrasound scanning demonstrates a large amount of ascites within the right lower abdominal quadrant. The right lower abdomen was prepped and draped in the usual sterile fashion. 1% lidocaine was used for local anesthesia. Following this, a 19 gauge, 7-cm, Yueh catheter was introduced. An ultrasound image was saved for documentation purposes. The paracentesis was performed. The catheter was removed and a dressing was applied. The patient tolerated the procedure well without immediate post procedural complication. FINDINGS: A total of approximately 5 L of clear yellow fluid was removed. IMPRESSION: Successful ultrasound-guided paracentesis yielding 5 liters of peritoneal fluid. Read by: Durenda Guthrie, PA-C Electronically Signed   By: Corrie Mckusick D.O.   On: 11/19/2020 15:09

## 2020-12-08 NOTE — Progress Notes (Addendum)
   Palliative Medicine Inpatient Follow Up Note  Family Meeting (12/08/2020) at 10:30AM:  Providers Present: Dr. Alvy Bimler, Dr. Karleen Hampshire, Tacey Ruiz, [facility]  Family Present: Peri Maris (spouse), Sherian Rein (daughter), & Tamala Fothergill (daughter)  Discussion:  Dr. Alvy Bimler provided a brief review of choices oncological history.  Discussed that at this point in time patient's cancer is not responding to treatment and that all avenues which could be offered have been explored.  Reviewed that unfortunately at this point in time even additional modalities of care such as artificial nutrition and therapy will not reverse the patient's cancer.  Reviewed that providing comfort to Orella is the best thing we can offer her at this point in time so that he does not suffer during her final journey in life.  Dr. Aaron Edelman be placed on hospice.  Brief review of what hospice in the home could provide was done. Dr. Alvy Bimler expressed a variety of concerns in regards to the care needs that will be required moving forward and the difficulties that patient's spouse, [name] would endure if she were at home with him.  We reviewed other options such as inpatient hospice.  Discussed that this would be an appropriate consideration as Sylvester this time will likely very very short in the setting of her multisystem failure.  Discussed that the advantage of inpatient hospice is that it would allow [name] and his family to focus on spending time with Kae[facility] rather than needing to provide the care to [place].  CODE STATUS was discussed and Elvin has been changed to a DO NOT RESUSCITATE as her goals at this point in time will be geared more towards comfort.  Dr. Alvy Bimler excused herself thereafter.  We summarized the conversation that had just been had.  I reviewed what comfort care  entails in the hospital.  Discussed what inpatient hospice would require. Reviewed that insurance covers the cost when patients have active  symptoms.   Questions and concerns answered.  We reviewed that the neck steps will be to communicate with the case management team for inpatient hospice evaluation.  SUMMARY OF RECOMMENDATIONS   DNAR/DNI  Comfort Focused Care  Medications per [facility]   Unrestricted visitation  TOC - Family would prefer [facility]   Ongoing PMT support  Time In: 9735 Time Out: 1140 Time Spent: 70 Greater than 50% of the time was spent in counseling and coordination of care ______________________________________________________________________________________ [place] Team Team Cell Phone: [phone] Please utilize secure chat with additional questions, if there is no response within 30 minutes please call the above phone number  Palliative Medicine Team providers are available by phone from 7am to 7pm daily and can be reached through the team cell phone.  Should this patient require assistance outside of these hours, please call the patient's attending physician.

## 2020-12-08 NOTE — Progress Notes (Signed)
Received request from [facility] for family interest in [facility]. Chart reviewed and hospice home eligibility is pending at this time. Spoke with husband and 2 daughters to confirm interest and explain services. Unfortunately [facility] does not have a bed to offer today. TOC is aware hospital liaison will follow up tomorrow or sooner if room becomes available.   Please do not hesitate to call with any hospice related questions or concerns.   Thank you for the opportunity to participate in this patient's care. Jhonnie Garner, Therapist, sports, [facility] Liaison [phone]

## 2020-12-09 ENCOUNTER — Telehealth: Payer: Self-pay | Admitting: Hematology and Oncology

## 2020-12-09 ENCOUNTER — Inpatient Hospital Stay (HOSPITAL_COMMUNITY): Payer: Medicare Other

## 2020-12-09 DIAGNOSIS — E43 Unspecified severe protein-calorie malnutrition: Secondary | ICD-10-CM

## 2020-12-09 DIAGNOSIS — R7989 Other specified abnormal findings of blood chemistry: Secondary | ICD-10-CM

## 2020-12-09 DIAGNOSIS — R180 Malignant ascites: Secondary | ICD-10-CM

## 2020-12-09 HISTORY — PX: IR PARACENTESIS: IMG2679

## 2020-12-09 MED ORDER — LIDOCAINE HCL (PF) 1 % IJ SOLN
INTRAMUSCULAR | Status: AC
  Filled 2020-12-09: qty 30

## 2020-12-09 MED ORDER — MORPHINE SULFATE (PF) 4 MG/ML IV SOLN
4.0000 mg | INTRAVENOUS | Status: DC | PRN
  Administered 2020-12-09: 4 mg via INTRAVENOUS
  Filled 2020-12-09: qty 1

## 2020-12-09 MED ORDER — QUETIAPINE FUMARATE 25 MG PO TABS
25.0000 mg | ORAL_TABLET | Freq: Every evening | ORAL | Status: DC | PRN
  Administered 2020-12-09: 25 mg via ORAL
  Filled 2020-12-09: qty 1

## 2020-12-09 MED ORDER — LIDOCAINE HCL (PF) 1 % IJ SOLN
INTRAMUSCULAR | Status: DC | PRN
  Administered 2020-12-09: 10 mL

## 2020-12-09 NOTE — Care Management Important Message (Signed)
Important Message  Patient Details  Name: [name] MRN: [id number] Date of Birth: 1941-03-24   Medicare Important Message Given:  Yes     Alliana Mcauliff Montine Circle 12/09/2020, 3:40 PM

## 2020-12-09 NOTE — Progress Notes (Signed)
Manufacturing engineer [facility]) Hospital Liaison Note:  Update on this [place] Referral  Unfortunately, [facility] is not able to offer a room at this time.  An Wheelwright will continue to follow and update tomorrow morning or sooner if a bed becomes available. Family and TOC aware.  Please call with any hospice related issues or questions that arise,  Gar Ponto, RN [facility] HLT [phone]

## 2020-12-09 NOTE — Procedures (Signed)
PROCEDURE SUMMARY:  Successful image-guided paracentesis from the right lateral abdomen.  Yielded 2.5 liters of hazy yellow fluid.  No immediate complications.  EBL: zero Patient tolerated well.   Specimen was not sent for labs.  Please see imaging section of Epic for full dictation.  Joaquim Nam PA-C 12/09/2020 10:02 AM

## 2020-12-09 NOTE — Plan of Care (Signed)
  Problem: Clinical Measurements: Goal: Will remain free from infection Outcome: Progressing Goal: Diagnostic test results will improve Outcome: Progressing Goal: Respiratory complications will improve Outcome: Progressing Goal: Cardiovascular complication will be avoided Outcome: Progressing   Problem: Activity: Goal: Risk for activity intolerance will decrease Outcome: Progressing   

## 2020-12-09 NOTE — Progress Notes (Signed)
Was given Haldol for agitation because has been yelling out and saying she needs to go home with her husband and is going to leave.  She became more and more agitated when she was told she is in the hospital.  She thought this nurse was "lying" to her to shut her up.  This nurse attempted to reassure her that she would not do that and she just became more and more agitated and nothing  else was helping to calm her down.

## 2020-12-09 NOTE — Progress Notes (Signed)
OT Cancellation Note  Patient Details Name: KILAH DRAHOS MRN: [id number] DOB: [birth year]   Cancelled Treatment:    Reason Eval/Treat Not Completed: OT screened, no needs identified, will sign off. Spoke with PT. Plan is for pt to go to residential Hospice facility.   Ramond Dial, OT/L   Acute OT Clinical Specialist Acute Rehabilitation Services Pager [phone] Office [phone]  12/09/2020, 10:21 AM

## 2020-12-09 NOTE — Progress Notes (Signed)
[facility] PROGRESS NOTE    Progress Note  [name]  ZOX:[id number] DOB: 1940/09/04 DOA: 12/06/2020 PCP: [name], MD     Brief Narrative:   [name] is an 80 y.o. female past medical history of ovarian cancer on chemotherapy, chronic atrial fibrillation on anticoagulation, anemia due to antineoplastic drugs comes in after being found acutely confused, states she was recently discharged from [place] on 11/30/2020 after having a abdominal pain due to small bowel obstruction during that hospital stay paracentesis remove about 4 L of fluid in the abdominal pain improved in the ED CT scan of the brain showed no acute abnormality abdominal x-ray showed a large calcified pelvic mass she was given 1 mg of Ativan and placed in soft restraints and admitted to the hospitalist service.   Assessment/Plan:   Acute metabolic encephalopathy: The of the head showed no acute findings, MRI was ordered but as was discussed with the family and has been discontinued. Etiology is thought to be secondary to dehydration and medications. The husband requested the patient to be evaluated by physical therapy to see if he can take her home with hospice and comfort measures.  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 Oncology on board recommended palliative care and hospice discussion. She is now a DNR/DNI. Mild abd pain, abd. distention on physical exam, will get IR to perform paracentesis.  Dehydration: Resolved with IV fluid hydration.  Failure to thrive/severe protein caloric malnutrition: Continue Ensure 3 times daily goals of care being addressed.  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  Goals of care: The oncologist had a long discussion with husband and 2 daughters, they ultimately agreed to focus on comfort measures. The family is  interested in residential hospice placement, appreciate palliative care's assistance and oncology involvement in his care.     M DVT prophylaxis: lovenox Family Communication:noen Status is: Inpatient  Remains inpatient appropriate because:Hemodynamically unstable  Dispo: The patient is from: Home              Anticipated d/c is to: Home              Patient currently is not medically stable to d/c.   Difficult to place patient No        Code Status:     Code Status Orders  (From admission, onward)           Start     Ordered   12/08/20 1126  Do not attempt resuscitation (DNR)  Continuous       Question Answer Comment  In the event of cardiac or respiratory ARREST Do not call a "code blue"   In the event of cardiac or respiratory ARREST Do not perform Intubation, CPR, defibrillation or ACLS   In the event of cardiac or respiratory ARREST Use medication by any route, position, wound care, and other measures to relive pain and suffering. May use oxygen, suction and manual treatment of airway obstruction as needed for comfort.      12/08/20 1126           Code Status History     Date Active Date Inactive Code Status Order ID Comments User Context   12/06/2020 1351 12/08/2020 1126 Full Code [id number]  Norval Morton, MD ED   09/09/2020 1708 09/14/2020 1445 Full Code [id number]  Harold Hedge, MD ED  IV Access:   Peripheral IV   Procedures and diagnostic studies:   No results found.   Medical Consultants:   None.   Subjective:    [name] some back pain abdominal pain.  Objective:    Vitals:   12/08/20 0400 12/08/20 0856 12/08/20 1150 12/08/20 2022  BP: (!) 144/63 136/61 126/67 (!) 124/50  Pulse: 78 79 71 74  Resp: 19 20  20   Temp: 98 F (36.7 C) 98 F (36.7 C) 98 F (36.7 C) 98.4 F (36.9 C)  TempSrc: Oral Oral Oral Oral  SpO2: 100% 99% 100% 99%  Weight:      Height:       SpO2: 99 %   Intake/Output Summary (Last  24 hours) at 12/09/2020 0802 Last data filed at 12/08/2020 1200 Gross per 24 hour  Intake --  Output 100 ml  Net -100 ml   Filed Weights   12/06/20 2000  Weight: 53.1 kg    Exam: General exam: In no acute distress. Respiratory system: Good air movement and clear to auscultation. Cardiovascular system: S1 & S2 heard, RRR. No JVD. Gastrointestinal system: Abdomen is nondistended, soft and nontender.  Extremities: No pedal edema. Skin: No rashes, lesions or ulcers Psychiatry: Judgement and insight appear normal. Mood & affect appropriate.    Data Reviewed:    Labs: Basic Metabolic Panel: Recent Labs  Lab 12/02/20 2204 12/06/20 0822 12/07/20 0226  NA 131* 135 134*  K 3.5 4.3 3.6  CL 95* 97* 98  CO2 27 27 24   GLUCOSE 132* 129* 83  BUN 44* 24* 18  CREATININE 1.07* 1.05* 0.77  CALCIUM 8.0* 8.2* 7.9*   GFR Estimated Creatinine Clearance: 41.6 mL/min (by C-G formula based on SCr of 0.77 mg/dL). Liver Function Tests: Recent Labs  Lab 12/02/20 2204 12/06/20 0822 12/07/20 0226  AST 27 41 28  ALT 51* 68* 53*  ALKPHOS 233* 221* 206*  BILITOT 0.5 0.6 0.7  PROT 5.3* 5.1* 4.9*  ALBUMIN 2.5* 2.5* 2.4*   Recent Labs  Lab 12/02/20 2204  LIPASE 22   Recent Labs  Lab 12/06/20 0822  AMMONIA 19   Coagulation profile No results for input(s): INR, PROTIME in the last 168 hours. COVID-19 Labs  No results for input(s): DDIMER, FERRITIN, LDH, CRP in the last 72 hours.  Lab Results  Component Value Date   SARSCOV2NAA NEGATIVE 12/06/2020   SARSCOV2NAA NEGATIVE 12/02/2020   [place] NEGATIVE 09/09/2020    CBC: Recent Labs  Lab 12/02/20 2204 12/06/20 0822 12/07/20 0226  WBC 7.0 9.8 8.0  NEUTROABS 6.4 6.8  --   HGB 10.7* 11.7* 12.6  HCT 33.0* 35.8* 38.2  MCV 88.9 89.5 88.0  PLT 291 232 177   Cardiac Enzymes: No results for input(s): CKTOTAL, CKMB, CKMBINDEX, TROPONINI in the last 168 hours. BNP (last 3 results) No results for input(s): PROBNP in the last  8760 hours. CBG: Recent Labs  Lab 12/08/20 0014 12/08/20 0129 12/08/20 0427 12/08/20 0730 12/08/20 1149  GLUCAP 74 75 111* 112* 111*   D-Dimer: No results for input(s): DDIMER in the last 72 hours. Hgb A1c: No results for input(s): HGBA1C in the last 72 hours. Lipid Profile: No results for input(s): CHOL, HDL, LDLCALC, TRIG, CHOLHDL, LDLDIRECT in the last 72 hours. Thyroid function studies: No results for input(s): TSH, T4TOTAL, T3FREE, THYROIDAB in the last 72 hours.  Invalid input(s): FREET3 Anemia work up: No results for input(s): VITAMINB12, FOLATE, FERRITIN, TIBC, IRON, RETICCTPCT in the last 72 hours. Sepsis  Labs: Recent Labs  Lab 12/02/20 2204 12/06/20 0822 12/07/20 0226  WBC 7.0 9.8 8.0   Microbiology Recent Results (from the past 240 hour(s))  Resp Panel by RT-PCR (Flu A&B, Covid) Urine, Clean Catch     Status: None   Collection Time: 12/02/20 11:16 PM   Specimen: Urine, Clean Catch; Nasopharyngeal(NP) swabs in vial transport medium  Result Value Ref Range Status   SARS Coronavirus 2 by RT PCR NEGATIVE NEGATIVE Final    Comment: (NOTE) SARS-CoV-2 target nucleic acids are NOT DETECTED.  The SARS-CoV-2 RNA is generally detectable in upper respiratory specimens during the acute phase of infection. The lowest concentration of SARS-CoV-2 viral copies this assay can detect is 138 copies/mL. A negative result does not preclude SARS-Cov-2 infection and should not be used as the sole basis for treatment or other patient management decisions. A negative result may occur with  improper specimen collection/handling, submission of specimen other than nasopharyngeal swab, presence of viral mutation(s) within the areas targeted by this assay, and inadequate number of viral copies(<138 copies/mL). A negative result must be combined with clinical observations, patient history, and epidemiological information. The expected result is Negative.  Fact Sheet for Patients:   EntrepreneurPulse.com.au  Fact Sheet for Healthcare Providers:  IncredibleEmployment.be  This test is no t yet approved or cleared by the [place] FDA and  has been authorized for detection and/or diagnosis of SARS-CoV-2 by FDA under an Emergency Use Authorization (EUA). This EUA will remain  in effect (meaning this test can be used) for the duration of the COVID-19 declaration under Section 564(b)(1) of the Act, 21 U.S.C.section 360bbb-3(b)(1), unless the authorization is terminated  or revoked sooner.       Influenza A by PCR NEGATIVE NEGATIVE Final   Influenza B by PCR NEGATIVE NEGATIVE Final    Comment: (NOTE) The Xpert Xpress SARS-CoV-2/FLU/RSV plus assay is intended as an aid in the diagnosis of influenza from Nasopharyngeal swab specimens and should not be used as a sole basis for treatment. Nasal washings and aspirates are unacceptable for Xpert Xpress SARS-CoV-2/FLU/RSV testing.  Fact Sheet for Patients: EntrepreneurPulse.com.au  Fact Sheet for Healthcare Providers: IncredibleEmployment.be  This test is not yet approved or cleared by the [place] FDA and has been authorized for detection and/or diagnosis of SARS-CoV-2 by FDA under an Emergency Use Authorization (EUA). This EUA will remain in effect (meaning this test can be used) for the duration of the COVID-19 declaration under Section 564(b)(1) of the Act, 21 U.S.C. section 360bbb-3(b)(1), unless the authorization is terminated or revoked.  Performed at [facility], [place] [address], [place], [place] [address]   Resp Panel by RT-PCR (Flu A&B, Covid) Nasopharyngeal Swab     Status: None   Collection Time: 12/06/20  3:03 PM   Specimen: Nasopharyngeal Swab; Nasopharyngeal(NP) swabs in vial transport medium  Result Value Ref Range Status   SARS Coronavirus 2 by RT PCR NEGATIVE NEGATIVE Final    Comment:  (NOTE) SARS-CoV-2 target nucleic acids are NOT DETECTED.  The SARS-CoV-2 RNA is generally detectable in upper respiratory specimens during the acute phase of infection. The lowest concentration of SARS-CoV-2 viral copies this assay can detect is 138 copies/mL. A negative result does not preclude SARS-Cov-2 infection and should not be used as the sole basis for treatment or other patient management decisions. A negative result may occur with  improper specimen collection/handling, submission of specimen other than nasopharyngeal swab, presence of viral mutation(s) within the areas targeted by this  assay, and inadequate number of viral copies(<138 copies/mL). A negative result must be combined with clinical observations, patient history, and epidemiological information. The expected result is Negative.  Fact Sheet for Patients:  EntrepreneurPulse.com.au  Fact Sheet for Healthcare Providers:  IncredibleEmployment.be  This test is no t yet approved or cleared by the [place] FDA and  has been authorized for detection and/or diagnosis of SARS-CoV-2 by FDA under an Emergency Use Authorization (EUA). This EUA will remain  in effect (meaning this test can be used) for the duration of the COVID-19 declaration under Section 564(b)(1) of the Act, 21 U.S.C.section 360bbb-3(b)(1), unless the authorization is terminated  or revoked sooner.       Influenza A by PCR NEGATIVE NEGATIVE Final   Influenza B by PCR NEGATIVE NEGATIVE Final    Comment: (NOTE) The Xpert Xpress SARS-CoV-2/FLU/RSV plus assay is intended as an aid in the diagnosis of influenza from Nasopharyngeal swab specimens and should not be used as a sole basis for treatment. Nasal washings and aspirates are unacceptable for Xpert Xpress SARS-CoV-2/FLU/RSV testing.  Fact Sheet for Patients: EntrepreneurPulse.com.au  Fact Sheet for Healthcare  Providers: IncredibleEmployment.be  This test is not yet approved or cleared by the [place] FDA and has been authorized for detection and/or diagnosis of SARS-CoV-2 by FDA under an Emergency Use Authorization (EUA). This EUA will remain in effect (meaning this test can be used) for the duration of the COVID-19 declaration under Section 564(b)(1) of the Act, 21 U.S.C. section 360bbb-3(b)(1), unless the authorization is terminated or revoked.  Performed at [facility], [place] [address] [place], [place] [address]      Medications:    Chlorhexidine Gluconate Cloth  6 each Topical Daily   feeding supplement  237 mL Oral BID BM   sodium chloride flush  3 mL Intravenous Q12H   Continuous Infusions:  promethazine (PHENERGAN) injection (IM or IVPB)        LOS: 3 days   Charlynne Cousins  [facility]  12/09/2020, 8:02 AM

## 2020-12-09 NOTE — Evaluation (Signed)
Physical Therapy Evaluation Patient Details Name: [name] MRN: [id number] DOB: [birth year] Today's Date: 12/09/2020   History of Present Illness  Pt is an 80 y/o female admitted on 6/10 secondary to acute metabolic encephalopathy. Pt with ovarian cancer and with malignant ascites. PMH includes a fib.  Clinical Impression  Pt admitted secondary to problem above with deficits below. Required mod A to stand and transfer from chair to bed. Pt with posterior lean and required assist to shift weight to take steps to bed. Per notes, plan is to d/c with hospice services. Pt's husband reports he does not believe he can physically assist pt as much as she will require. Feel residential hospice will likely be best d/c location. If family decides to go home will require equipment below. Will continue to follow acutely.     Follow Up Recommendations Other (comment) (hospice (residential vs home))    [facility] bed;Other (comment) (hoyer lift with pad; PTAR transport home)    Recommendations for Other Services       Precautions / Restrictions Precautions Precautions: Fall Restrictions Weight Bearing Restrictions: No      Mobility  Bed Mobility Overal bed mobility: Needs Assistance Bed Mobility: Sit to Supine       Sit to supine: Mod assist   General bed mobility comments: Mod A for LE ssist to return to supine.    Transfers Overall transfer level: Needs assistance Equipment used: Rolling walker (2 wheeled);None Transfers: Sit to/from [facility] Sit to Stand: Mod assist;Max assist Stand pivot transfers: Mod assist       General transfer comment: Attempted to stand with RW first and pt with heavy posterior lean and was unable to take steps. PT stood in front of pt on second attempt and had pt hold to PT arms to stand and transfer back to bed. Required mod A for lift assist and steadying to stand and transfer.  Ambulation/Gait              General Gait Details: unable  Stairs            Wheelchair Mobility    Modified Rankin (Stroke Patients Only)       Balance Overall balance assessment: Needs assistance Sitting-balance support: No upper extremity supported;Feet supported Sitting balance-Leahy Scale: Fair     Standing balance support: Bilateral upper extremity supported;During functional activity Standing balance-Leahy Scale: Poor Standing balance comment: Reliant on UE and external support                             Pertinent Vitals/Pain Pain Assessment: No/denies pain    Home Living Family/patient expects to be discharged to:: Other (Comment)                 Additional Comments: likely going to residential hospice. At home has steps to get into home and to go up to second floor for bathroom.    Prior Function Level of Independence: Needs assistance   Gait / Transfers Assistance Needed: Pt required assist to stand but was able to take steps without use of AD or assist.  ADL's / Homemaking Assistance Needed: Required assist for bathing/dressing        Hand Dominance        Extremity/Trunk Assessment   Upper Extremity Assessment Upper Extremity Assessment: Generalized weakness    Lower Extremity Assessment Lower Extremity Assessment: Generalized weakness    Cervical / Trunk Assessment Cervical /  Trunk Assessment: Normal  Communication   Communication: No difficulties  Cognition Arousal/Alertness: Awake/alert Behavior During Therapy: Restless Overall Cognitive Status: Impaired/Different from baseline Area of Impairment: Memory;Following commands;Awareness;Safety/judgement;Problem solving;Attention                   Current Attention Level: Sustained Memory: Decreased short-term memory;Decreased recall of precautions Following Commands: Follows one step commands with increased time Safety/Judgement: Decreased awareness of deficits;Decreased  awareness of safety Awareness: Emergent Problem Solving: Slow processing        General Comments General comments (skin integrity, edema, etc.): Pt's husband present during session    Exercises     Assessment/Plan    PT Assessment Patient needs continued PT services  PT Problem List Decreased strength;Decreased balance;Decreased mobility;Decreased cognition;Decreased knowledge of use of DME;Decreased safety awareness;Decreased knowledge of precautions       PT Treatment Interventions DME instruction;Gait training;Stair training;Therapeutic activities;Functional mobility training;Therapeutic exercise;Balance training;Patient/family education    PT Goals (Current goals can be found in the Care Plan section)  Acute Rehab PT Goals Patient Stated Goal: to go home PT Goal Formulation: With patient/family Time For Goal Achievement: 01-04-2021 Potential to Achieve Goals: Fair    Frequency Min 2X/week   Barriers to discharge        Co-evaluation               AM-PAC PT "6 Clicks" Mobility  Outcome Measure Help needed turning from your back to your side while in a flat bed without using bedrails?: A Little Help needed moving from lying on your back to sitting on the side of a flat bed without using bedrails?: A Lot Help needed moving to and from a bed to a chair (including a wheelchair)?: A Lot Help needed standing up from a chair using your arms (e.g., wheelchair or bedside chair)?: A Lot Help needed to walk in hospital room?: Total Help needed climbing 3-5 steps with a railing? : Total 6 Click Score: 11    End of Session Equipment Utilized During Treatment: Gait belt Activity Tolerance: Patient tolerated treatment well Patient left: in bed;with call bell/phone within reach;with bed alarm set;with family/visitor present;Other (comment) (transport staff present) Nurse Communication: Mobility status PT Visit Diagnosis: Other abnormalities of gait and mobility  (R26.89);Unsteadiness on feet (R26.81);Muscle weakness (generalized) (M62.81);Difficulty in walking, not elsewhere classified (R26.2)    Time: 8333-8329 PT Time Calculation (min) (ACUTE ONLY): 16 min   Charges:   PT Evaluation $PT Eval Moderate Complexity: 1 Mod          Reuel Derby, PT, DPT  Acute Rehabilitation Services  Pager: [phone] Office: [phone]   Rudean Hitt 12/09/2020, 9:50 AM

## 2020-12-09 NOTE — Telephone Encounter (Signed)
I called her daughter, [name] and addressed all her questions today

## 2020-12-09 NOTE — Progress Notes (Signed)
   Palliative Medicine Inpatient Follow Up Note  Reason for consult:  Goals of Care   HPI:  Per intake H&P --> [name] is a 80 y.o. female with medical history significant of ovarian cancer on chemotherapy, atrial fibrillation, anemia, arthritis, and GERD who presented from home after being found to be acutely altered talking "gibberish".  Oncology has seen patient - she is no longer a candidate for ongoing therapy. They broached the topic of in home hospice which the family appeared interested in. Palliative care has been consulted to further discuss goals of care.  Today's Discussion (12/09/2020):  *Please note that this is a verbal dictation therefore any spelling or grammatical errors are due to the "[place] [employer]" system interpretation.  Chart reviewed. Plan for paracentesis this morning to aid in symptom relief. Received x3 doses of oxycodone in the last 24 hours.   I met with [name] this morning. She was alert and oriented. She appeared very comfortable and denied any complaints. [name] requested to get out of the bed. I was able to help her stand up and get into the chair next to the bed. I reviewed with her her present clinical condition. She was receptive in conversations to discussion points. [name] expressed some of her hopes and fears for the future. I provided support through therapeutic listening.   Plan for patient to ideally transition to an inpatient hospice.   Questions and concerns addressed   Objective Assessment: Vital Signs Vitals:   12/09/20 0900 12/09/20 1017  BP: 134/66 120/66  Pulse:    Resp:    Temp:    SpO2:      Intake/Output Summary (Last 24 hours) at 12/09/2020 1119 Last data filed at 12/09/2020 0900 Gross per 24 hour  Intake --  Output 450 ml  Net -450 ml   Last Weight  Most recent update: 12/06/2020  9:41 PM    Weight  53.1 kg (117 lb 1 oz)            Gen:  Ill appearing elderly F  HEENT: Dry mucous membranes CV: Regular rate and  irregular rhythm PULM: On RA ABD: (+) Ascites EXT: No edema Neuro: Alert and oriented x2-3  SUMMARY OF RECOMMENDATIONS   DNAR/DNI   Comfort Focused Care   Medications per [facility]   Unrestricted visitation   TOC - Family would prefer [employer]   Ongoing PMT support  Time Spent:35 Greater than 50% of the time was spent in counseling and coordination of care ______________________________________________________________________________________ [place] Team Team Cell Phone: [phone] Please utilize secure chat with additional questions, if there is no response within 30 minutes please call the above phone number  Palliative Medicine Team providers are available by phone from 7am to 7pm daily and can be reached through the team cell phone.  Should this patient require assistance outside of these hours, please call the patient's attending physician.

## 2020-12-10 DIAGNOSIS — C569 Malignant neoplasm of unspecified ovary: Principal | ICD-10-CM

## 2020-12-10 MED ORDER — ACETAMINOPHEN 325 MG PO TABS: 650.0000 mg | ORAL_TABLET | Freq: Four times a day (QID) | ORAL | Status: AC | PRN

## 2020-12-10 MED ORDER — HALOPERIDOL 0.5 MG PO TABS: 0.5000 mg | ORAL_TABLET | ORAL | Status: AC | PRN

## 2020-12-10 MED ORDER — POLYVINYL ALCOHOL 1.4 % OP SOLN
1.0000 [drp] | Freq: Four times a day (QID) | OPHTHALMIC | 0 refills | Status: AC | PRN
Start: 2020-12-10 — End: ?

## 2020-12-10 MED ORDER — POLYETHYLENE GLYCOL 3350 17 G PO PACK: 17.0000 g | PACK | Freq: Two times a day (BID) | ORAL | 0 refills | Status: AC | PRN

## 2020-12-10 MED ORDER — QUETIAPINE FUMARATE 25 MG PO TABS: 25.0000 mg | ORAL_TABLET | Freq: Every evening | ORAL | Status: AC | PRN

## 2020-12-10 MED ORDER — PROMETHAZINE HCL 6.25 MG/5ML PO SYRP: 6.2500 mg | ORAL_SOLUTION | Freq: Four times a day (QID) | ORAL | 1 refills | Status: AC | PRN

## 2020-12-10 MED ORDER — GLYCOPYRROLATE 1 MG PO TABS: 1.0000 mg | ORAL_TABLET | ORAL | Status: AC | PRN

## 2020-12-10 MED ORDER — POLYETHYLENE GLYCOL 3350 17 G PO PACK: 17.0000 g | PACK | Freq: Two times a day (BID) | ORAL | Status: DC

## 2020-12-10 MED ORDER — OXYCODONE-ACETAMINOPHEN 5-325 MG PO TABS: 1.0000 | ORAL_TABLET | ORAL | 0 refills | Status: AC | PRN

## 2020-12-10 NOTE — Progress Notes (Addendum)
Manufacturing engineer [facility])  [facility] has a bed for Ms. [name] today.  Awaiting return call from her dtr to confirm they would like to accept to the bed. Once this is confirmed, her family will have to complete necessary consents prior to transport being arranged.  Updated TOC manager and attending via [place].  Thank you, Venia Carbon RN, BSN, [facility] Liaison   **spoke with dtr [name] and spouse [name], they would like the bed at [facility]. They plan to meet our SW at 1130 to complete necessary consents and then transport can be arranged.  RN staff, please call report to [phone] at any time. Room is assigned when report is called.

## 2020-12-10 NOTE — Discharge Summary (Signed)
Physician Discharge Summary  [name] MWN:[id number] DOB: 29-Apr-1941 DOA: 12/06/2020  PCP: [name], MD  Admit date: 12/06/2020 Discharge date: 12/10/2020  Admitted From: Home Disposition: Residential hospice facility   Recommendations for Outpatient Follow-up:  Comfort measures  Home Health:No Equipment/Devices:None  Discharge Condition:Hospice CODE STATUS:DNR Diet recommendation: Heart Healthy   Brief/Interim Summary: 80 y.o. female past medical history of ovarian cancer on chemotherapy, chronic atrial fibrillation on anticoagulation, anemia due to antineoplastic drugs comes in after being found acutely confused, states she was recently discharged from [facility] on 11/30/2020 after having a abdominal pain due to small bowel obstruction during that hospital stay paracentesis remove about 4 L of fluid in the abdominal pain improved in the ED CT scan of the brain showed no acute abnormality abdominal x-ray showed a large calcified pelvic mass she was given 1 mg of Ativan and placed in soft restraints and admitted to the hospitalist service.  Discharge Diagnoses:  Principal Problem:   Acute metabolic encephalopathy Active Problems:   Malignant ascites   Ovarian ca (HCC)   Elevated LFTs   Anemia due to chronic illness   Severe protein-calorie malnutrition (HCC)   Failure to thrive in adult  Acute metabolic encephalopathy: CT of the head showed no acute findings, etiology thought to be secondary to dehydration and medication. Oncology was assisting with end-of-life discussion, palliative care was consulted. Resolved with IV fluid hydration and withholding of her medication.  Stage IV ovarian cancer with malignant ascites: With an ongoing decline in her condition with recurrent malignant ascites with multiple paracentesis last one in house on 12/09/2020, before she had a paracentesis on 12/01/2018 that yield 4 L of fluid. Oncology was consulted who recommended moving  towards comfort care Perative care and concurrently met with family and she was made a DNR DNI. After long discussion the husband decided to transfer the patient to residential hospice and moving towards comfort care.  Dehydration, Resolved with IV fluid hydration.  Failure to thrive/severe protein caloric malnutrition: So started Ensure 3 times daily she was not tolerating this as she was not hungry.  Anemia due to antineoplastic drug: No signs of overt bleeding.  Goals of care: Oncology had a long discussion with husband and 2 daughters he ultimately agreed to focus on comfort measures, the family was interested in residential hospice facility she was transferred.  Discharge Instructions  Discharge Instructions     Diet - low sodium heart healthy   Complete by: As directed    Increase activity slowly   Complete by: As directed       Allergies as of 12/10/2020       Reactions   Cephalexin Other (See Comments)   dehydrated   Hydrocodone-guaifenesin Other (See Comments)   Dizzy   Other Other (See Comments)   Unknown   Tape Other (See Comments)   Unknown Surgical tape   Tramadol Hcl Other (See Comments)   Constipation        Medication List     STOP taking these medications    dexamethasone 4 MG tablet Commonly known as: DECADRON   Ensure   mirtazapine 15 MG tablet Commonly known as: REMERON   ondansetron 8 MG tablet Commonly known as: ZOFRAN   OXYCODONE-ASPIRIN PO Replaced by: oxyCODONE-acetaminophen 5-325 MG tablet   prochlorperazine 10 MG tablet Commonly known as: COMPAZINE       TAKE these medications    acetaminophen 325 MG tablet Commonly known as: TYLENOL Take 2 tablets (650 mg  total) by mouth every 6 (six) hours as needed for mild pain (or Fever >/= 101).   glycopyrrolate 1 MG tablet Commonly known as: ROBINUL Take 1 tablet (1 mg total) by mouth every 4 (four) hours as needed (excessive secretions).   haloperidol 0.5 MG  tablet Commonly known as: HALDOL Take 1 tablet (0.5 mg total) by mouth every 4 (four) hours as needed for agitation (or delirium).   oxyCODONE-acetaminophen 5-325 MG tablet Commonly known as: PERCOCET/ROXICET Take 1-2 tablets by mouth every 4 (four) hours as needed for moderate pain. Replaces: OXYCODONE-ASPIRIN PO   polyethylene glycol 17 g packet Commonly known as: MIRALAX / GLYCOLAX Take 17 g by mouth 2 (two) times daily as needed for severe constipation.   polyvinyl alcohol 1.4 % ophthalmic solution Commonly known as: LIQUIFILM TEARS Place 1 drop into both eyes 4 (four) times daily as needed for dry eyes.   promethazine 6.25 MG/5ML syrup Commonly known as: PHENERGAN Take 5 mLs (6.25 mg total) by mouth 4 (four) times daily as needed for nausea or vomiting.   QUEtiapine 25 MG tablet Commonly known as: SEROQUEL Take 1 tablet (25 mg total) by mouth at bedtime as needed (agitation).        Allergies  Allergen Reactions   Cephalexin Other (See Comments)    dehydrated   Hydrocodone-Guaifenesin Other (See Comments)    Dizzy   Other Other (See Comments)    Unknown   Tape Other (See Comments)    Unknown Surgical tape   Tramadol Hcl Other (See Comments)    Constipation    Consultations: Oncology Palliative care Interventional radiology   Procedures/Studies: CT ABDOMEN PELVIS WO CONTRAST  Result Date: 12/06/2020 CLINICAL DATA:  Abdominal pain and distension EXAM: CT ABDOMEN AND PELVIS WITHOUT CONTRAST TECHNIQUE: Multidetector CT imaging of the abdomen and pelvis was performed following the standard protocol without IV contrast. COMPARISON:  Plain film from earlier in the same day, CT from 09/11/2020 and 11/28/2020 FINDINGS: Lower chest: Tiny effusions are noted bilaterally. No focal infiltrate or sizable nodule is seen. Hepatobiliary: No focal liver abnormality is seen. Status post cholecystectomy. No biliary dilatation. Pancreas: Unremarkable. No pancreatic ductal  dilatation or surrounding inflammatory changes. Spleen: Normal in size without focal abnormality. Adrenals/Urinary Tract: Adrenal glands are within normal limits. Kidneys are well visualized. No renal calculi or obstructive changes are noted. Renal cyst is noted in the upper pole on the left stable in appearance from multiple previous exams. Bladder is decompressed. Stomach/Bowel: Colon shows no obstructive or inflammatory changes. Appendix has been surgically removed. Stomach is decompressed. No sizeable small-bowel dilatation is seen. Previously seen small bowel dilatation secondary to an anterior abdominal wall hernia is no longer present. The hernia has been reduced with only minimal amount of ascites within the hernia sac. Vascular/Lymphatic: Aortic atherosclerosis. No enlarged abdominal or pelvic lymph nodes. Reproductive: Large irregularly calcified mass lesion is noted arising from the right adnexa. This is consistent with the patient's given clinical history of ovarian carcinoma. Multiple scattered mesenteric calcified masses are noted consistent with prior metastatic disease. The overall appearance is similar to that seen on prior CT examinations. The uterus and left adnexa appear within normal limits. Other: Moderate ascites is noted primarily surrounding the liver. The overall degree of ascites is improved from the prior CT from 11/28/2020. No free air is seen. Musculoskeletal: Right hip replacement is noted. Degenerative changes of lumbar spine are seen. No lytic or sclerotic lesions are identified to suggest osseous metastatic disease. No compression deformities  are noted. Mild anterolisthesis of L5 on S1 is again identified. Large lipoma is noted in the left lateral abdominal wall stable in appearance from previous exams. IMPRESSION: Moderate ascites although improved when compared with the prior exam of 11/28/2020. Heavily calcified right adnexal and pelvic mass consistent with the patient's known  history of ovarian carcinoma. Multiple calcified mesenteric masses are again identified and stable. Previously seen small bowel dilatation has resolved following reduction of anterior abdominal wall hernia. The hernia contains only ascitic fluid at this time. Tiny bilateral pleural effusions. Electronically Signed   By: Inez Catalina M.D.   On: 12/06/2020 15:08   CT Head Wo Contrast  Result Date: 12/06/2020 CLINICAL DATA:  Delirium. EXAM: CT HEAD WITHOUT CONTRAST TECHNIQUE: Contiguous axial images were obtained from the base of the skull through the vertex without intravenous contrast. COMPARISON:  MRI October 18, 2019. FINDINGS: Motion limited evaluation.  Within this limitation: Brain: No evidence of acute large vascular territory infarction, hemorrhage, hydrocephalus, extra-axial collection or mass lesion/mass effect. Moderate white matter hypoattenuation, most likely related to chronic microvascular ischemic disease. Moderate atrophy with ex vacuo ventricular dilation, similar to prior. Vascular: No hyperdense vessel identified. Calcific atherosclerosis. Skull: No acute fracture. Sinuses/Orbits: Visualized sinuses are largely clear. Unremarkable orbits. Other: No mastoid effusions. IMPRESSION: 1. No evidence of acute intracranial abnormality on this motion limited exam. 2. Moderate chronic microvascular ischemic disease and atrophy. Electronically Signed   By: Margaretha Sheffield MD   On: 12/06/2020 09:58   DG Abdomen Acute W/Chest  Result Date: 12/06/2020 CLINICAL DATA:  Abdominal pain. EXAM: DG ABDOMEN ACUTE WITH 1 VIEW CHEST COMPARISON:  Most recent September 12, 2020. FINDINGS: There is gas within distal colon. Mildly dilated small bowel loops in the central abdomen (measuring approximately 3.2 cm on decubitus image). No evidence of free air. No evidence of nephrolithiasis, although overlying bowel gas significantly limits evaluation. Similar large calcified mass in the pelvis. Partially imaged right hip  arthroplasty. Left hip and lumbar degenerative change. Cholecystectomy clips. Mild diffuse interstitial prominence, favored chronic. No consolidation. No visible pleural effusions or pneumothorax on the semi erect chest radiograph. Similar cardiomediastinal silhouette. Right reverse shoulder arthroplasty, partially imaged. Severe left shoulder degenerative change. IMPRESSION: 1. Mildly dilated small bowel loop in the central abdomen with gas in distal colon. Although indeterminate, findings could reflect a partial small bowel obstruction. CT abdomen/pelvis could further evaluate. 2. Similar large calcified mass in the pelvis, further evaluated on prior CT. Electronically Signed   By: Margaretha Sheffield MD   On: 12/06/2020 09:52   IR Paracentesis  Result Date: 12/09/2020 INDICATION: Patient with history of ovarian cancer, recurrent malignant ascites. Request to IR for therapeutic paracentesis with 5 L maximum. EXAM: ULTRASOUND GUIDED THERAPEUTIC PARACENTESIS MEDICATIONS: 10 mL% lidocaine COMPLICATIONS: None immediate. PROCEDURE: Informed written consent was obtained from the patient after a discussion of the risks, benefits and alternatives to treatment. A timeout was performed prior to the initiation of the procedure. Initial ultrasound scanning demonstrates a moderate amount of ascites within the right lower abdominal quadrant. The right lower abdomen was prepped and draped in the usual sterile fashion. 1% lidocaine was used for local anesthesia. Following this, a 19 gauge, 10-cm, Yueh catheter was introduced. An ultrasound image was saved for documentation purposes. The paracentesis was performed. The catheter was removed and a dressing was applied. The patient tolerated the procedure well without immediate post procedural complication. FINDINGS: A total of approximately 2.5 L of hazy fluid was removed. IMPRESSION: Successful ultrasound-guided  paracentesis yielding 2.5 liters of peritoneal fluid. Read by  Candiss Norse, PA-C Electronically Signed   By: Lucrezia Europe M.D.   On: 12/09/2020 10:41   IR Paracentesis  Result Date: 11/19/2020 INDICATION: History of ovarian cancer with recurrent malignant ascites. Request for therapeutic paracentesis up to 5 L. EXAM: ULTRASOUND GUIDED  PARACENTESIS MEDICATIONS: 10 mL 1% lidocaine COMPLICATIONS: None immediate. PROCEDURE: Informed written consent was obtained from the patient after a discussion of the risks, benefits and alternatives to treatment. A timeout was performed prior to the initiation of the procedure. Initial ultrasound scanning demonstrates a large amount of ascites within the right lower abdominal quadrant. The right lower abdomen was prepped and draped in the usual sterile fashion. 1% lidocaine was used for local anesthesia. Following this, a 19 gauge, 7-cm, Yueh catheter was introduced. An ultrasound image was saved for documentation purposes. The paracentesis was performed. The catheter was removed and a dressing was applied. The patient tolerated the procedure well without immediate post procedural complication. FINDINGS: A total of approximately 5 L of clear yellow fluid was removed. IMPRESSION: Successful ultrasound-guided paracentesis yielding 5 liters of peritoneal fluid. Read by: Durenda Guthrie, PA-C Electronically Signed   By: Corrie Mckusick D.O.   On: 11/19/2020 15:09   (Echo, Carotid, EGD, Colonoscopy, ERCP)    Subjective: She relates her pain is controlled had a bowel movement.  Discharge Exam: Vitals:   12/09/20 2019 12/10/20 0804  BP: (!) 123/58 (!) 141/66  Pulse: 73 85  Resp: 16 16  Temp: 98.5 F (36.9 C) 98.1 F (36.7 C)  SpO2: 95% 96%   Vitals:   12/09/20 0900 12/09/20 1017 12/09/20 2019 12/10/20 0804  BP: 134/66 120/66 (!) 123/58 (!) 141/66  Pulse:   73 85  Resp:   16 16  Temp:   98.5 F (36.9 C) 98.1 F (36.7 C)  TempSrc:   Oral Oral  SpO2:   95% 96%  Weight:      Height:        General: Pt is alert, awake, not  in acute distress Cardiovascular: RRR, S1/S2 +, no rubs, no gallops Respiratory: CTA bilaterally, no wheezing, no rhonchi Abdominal: Soft, NT, ND, bowel sounds + Extremities: no edema, no cyanosis    The results of significant diagnostics from this hospitalization (including imaging, microbiology, ancillary and laboratory) are listed below for reference.     Microbiology: Recent Results (from the past 240 hour(s))  Resp Panel by RT-PCR (Flu A&B, Covid) Urine, Clean Catch     Status: None   Collection Time: 12/02/20 11:16 PM   Specimen: Urine, Clean Catch; Nasopharyngeal(NP) swabs in vial transport medium  Result Value Ref Range Status   SARS Coronavirus 2 by RT PCR NEGATIVE NEGATIVE Final    Comment: (NOTE) SARS-CoV-2 target nucleic acids are NOT DETECTED.  The SARS-CoV-2 RNA is generally detectable in upper respiratory specimens during the acute phase of infection. The lowest concentration of SARS-CoV-2 viral copies this assay can detect is 138 copies/mL. A negative result does not preclude SARS-Cov-2 infection and should not be used as the sole basis for treatment or other patient management decisions. A negative result may occur with  improper specimen collection/handling, submission of specimen other than nasopharyngeal swab, presence of viral mutation(s) within the areas targeted by this assay, and inadequate number of viral copies(<138 copies/mL). A negative result must be combined with clinical observations, patient history, and epidemiological information. The expected result is Negative.  Fact Sheet for Patients:  EntrepreneurPulse.com.au  Fact  Sheet for Healthcare Providers:  IncredibleEmployment.be  This test is no t yet approved or cleared by the [place] FDA and  has been authorized for detection and/or diagnosis of SARS-CoV-2 by FDA under an Emergency Use Authorization (EUA). This EUA will remain  in effect (meaning this  test can be used) for the duration of the COVID-19 declaration under Section 564(b)(1) of the Act, 21 U.S.C.section 360bbb-3(b)(1), unless the authorization is terminated  or revoked sooner.       Influenza A by PCR NEGATIVE NEGATIVE Final   Influenza B by PCR NEGATIVE NEGATIVE Final    Comment: (NOTE) The Xpert Xpress SARS-CoV-2/FLU/RSV plus assay is intended as an aid in the diagnosis of influenza from Nasopharyngeal swab specimens and should not be used as a sole basis for treatment. Nasal washings and aspirates are unacceptable for Xpert Xpress SARS-CoV-2/FLU/RSV testing.  Fact Sheet for Patients: EntrepreneurPulse.com.au  Fact Sheet for Healthcare Providers: IncredibleEmployment.be  This test is not yet approved or cleared by the [place] FDA and has been authorized for detection and/or diagnosis of SARS-CoV-2 by FDA under an Emergency Use Authorization (EUA). This EUA will remain in effect (meaning this test can be used) for the duration of the COVID-19 declaration under Section 564(b)(1) of the Act, 21 U.S.C. section 360bbb-3(b)(1), unless the authorization is terminated or revoked.  Performed at [facility], [place] [address], [place], [place] [address]   Resp Panel by RT-PCR (Flu A&B, Covid) Nasopharyngeal Swab     Status: None   Collection Time: 12/06/20  3:03 PM   Specimen: Nasopharyngeal Swab; Nasopharyngeal(NP) swabs in vial transport medium  Result Value Ref Range Status   SARS Coronavirus 2 by RT PCR NEGATIVE NEGATIVE Final    Comment: (NOTE) SARS-CoV-2 target nucleic acids are NOT DETECTED.  The SARS-CoV-2 RNA is generally detectable in upper respiratory specimens during the acute phase of infection. The lowest concentration of SARS-CoV-2 viral copies this assay can detect is 138 copies/mL. A negative result does not preclude SARS-Cov-2 infection and should not be used as the sole basis for treatment  or other patient management decisions. A negative result may occur with  improper specimen collection/handling, submission of specimen other than nasopharyngeal swab, presence of viral mutation(s) within the areas targeted by this assay, and inadequate number of viral copies(<138 copies/mL). A negative result must be combined with clinical observations, patient history, and epidemiological information. The expected result is Negative.  Fact Sheet for Patients:  EntrepreneurPulse.com.au  Fact Sheet for Healthcare Providers:  IncredibleEmployment.be  This test is no t yet approved or cleared by the [place] FDA and  has been authorized for detection and/or diagnosis of SARS-CoV-2 by FDA under an Emergency Use Authorization (EUA). This EUA will remain  in effect (meaning this test can be used) for the duration of the COVID-19 declaration under Section 564(b)(1) of the Act, 21 U.S.C.section 360bbb-3(b)(1), unless the authorization is terminated  or revoked sooner.       Influenza A by PCR NEGATIVE NEGATIVE Final   Influenza B by PCR NEGATIVE NEGATIVE Final    Comment: (NOTE) The Xpert Xpress SARS-CoV-2/FLU/RSV plus assay is intended as an aid in the diagnosis of influenza from Nasopharyngeal swab specimens and should not be used as a sole basis for treatment. Nasal washings and aspirates are unacceptable for Xpert Xpress SARS-CoV-2/FLU/RSV testing.  Fact Sheet for Patients: EntrepreneurPulse.com.au  Fact Sheet for Healthcare Providers: IncredibleEmployment.be  This test is not yet approved or cleared by the [place]  FDA and has been authorized for detection and/or diagnosis of SARS-CoV-2 by FDA under an Emergency Use Authorization (EUA). This EUA will remain in effect (meaning this test can be used) for the duration of the COVID-19 declaration under Section 564(b)(1) of the Act, 21 U.S.C. section  360bbb-3(b)(1), unless the authorization is terminated or revoked.  Performed at [facility], [place] [address] [place], [place] [address]      Labs: BNP (last 3 results) No results for input(s): BNP in the last 8760 hours. Basic Metabolic Panel: Recent Labs  Lab 12/06/20 0822 12/07/20 0226  NA 135 134*  K 4.3 3.6  CL 97* 98  CO2 27 24  GLUCOSE 129* 83  BUN 24* 18  CREATININE 1.05* 0.77  CALCIUM 8.2* 7.9*   Liver Function Tests: Recent Labs  Lab 12/06/20 0822 12/07/20 0226  AST 41 28  ALT 68* 53*  ALKPHOS 221* 206*  BILITOT 0.6 0.7  PROT 5.1* 4.9*  ALBUMIN 2.5* 2.4*   No results for input(s): LIPASE, AMYLASE in the last 168 hours. Recent Labs  Lab 12/06/20 0822  AMMONIA 19   CBC: Recent Labs  Lab 12/06/20 0822 12/07/20 0226  WBC 9.8 8.0  NEUTROABS 6.8  --   HGB 11.7* 12.6  HCT 35.8* 38.2  MCV 89.5 88.0  PLT 232 177   Cardiac Enzymes: No results for input(s): CKTOTAL, CKMB, CKMBINDEX, TROPONINI in the last 168 hours. BNP: Invalid input(s): POCBNP CBG: Recent Labs  Lab 12/08/20 0014 12/08/20 0129 12/08/20 0427 12/08/20 0730 12/08/20 1149  GLUCAP 74 75 111* 112* 111*   D-Dimer No results for input(s): DDIMER in the last 72 hours. Hgb A1c No results for input(s): HGBA1C in the last 72 hours. Lipid Profile No results for input(s): CHOL, HDL, LDLCALC, TRIG, CHOLHDL, LDLDIRECT in the last 72 hours. Thyroid function studies No results for input(s): TSH, T4TOTAL, T3FREE, THYROIDAB in the last 72 hours.  Invalid input(s): FREET3 Anemia work up No results for input(s): VITAMINB12, FOLATE, FERRITIN, TIBC, IRON, RETICCTPCT in the last 72 hours. Urinalysis    Component Value Date/Time   COLORURINE YELLOW 12/06/2020 0822   APPEARANCEUR HAZY (A) 12/06/2020 0822   LABSPEC 1.020 12/06/2020 0822   PHURINE 8.0 12/06/2020 0822   GLUCOSEU NEGATIVE 12/06/2020 0822   HGBUR NEGATIVE 12/06/2020 0822   BILIRUBINUR NEGATIVE 12/06/2020 0822    KETONESUR NEGATIVE 12/06/2020 0822   PROTEINUR 30 (A) 12/06/2020 0822   NITRITE NEGATIVE 12/06/2020 0822   LEUKOCYTESUR NEGATIVE 12/06/2020 8341   Sepsis Labs Invalid input(s): PROCALCITONIN,  WBC,  LACTICIDVEN Microbiology Recent Results (from the past 240 hour(s))  Resp Panel by RT-PCR (Flu A&B, Covid) Urine, Clean Catch     Status: None   Collection Time: 12/02/20 11:16 PM   Specimen: Urine, Clean Catch; Nasopharyngeal(NP) swabs in vial transport medium  Result Value Ref Range Status   SARS Coronavirus 2 by RT PCR NEGATIVE NEGATIVE Final    Comment: (NOTE) SARS-CoV-2 target nucleic acids are NOT DETECTED.  The SARS-CoV-2 RNA is generally detectable in upper respiratory specimens during the acute phase of infection. The lowest concentration of SARS-CoV-2 viral copies this assay can detect is 138 copies/mL. A negative result does not preclude SARS-Cov-2 infection and should not be used as the sole basis for treatment or other patient management decisions. A negative result may occur with  improper specimen collection/handling, submission of specimen other than nasopharyngeal swab, presence of viral mutation(s) within the areas targeted by this assay, and inadequate number of viral  copies(<138 copies/mL). A negative result must be combined with clinical observations, patient history, and epidemiological information. The expected result is Negative.  Fact Sheet for Patients:  EntrepreneurPulse.com.au  Fact Sheet for Healthcare Providers:  IncredibleEmployment.be  This test is no t yet approved or cleared by the [place] FDA and  has been authorized for detection and/or diagnosis of SARS-CoV-2 by FDA under an Emergency Use Authorization (EUA). This EUA will remain  in effect (meaning this test can be used) for the duration of the COVID-19 declaration under Section 564(b)(1) of the Act, 21 U.S.C.section 360bbb-3(b)(1), unless the  authorization is terminated  or revoked sooner.       Influenza A by PCR NEGATIVE NEGATIVE Final   Influenza B by PCR NEGATIVE NEGATIVE Final    Comment: (NOTE) The Xpert Xpress SARS-CoV-2/FLU/RSV plus assay is intended as an aid in the diagnosis of influenza from Nasopharyngeal swab specimens and should not be used as a sole basis for treatment. Nasal washings and aspirates are unacceptable for Xpert Xpress SARS-CoV-2/FLU/RSV testing.  Fact Sheet for Patients: EntrepreneurPulse.com.au  Fact Sheet for Healthcare Providers: IncredibleEmployment.be  This test is not yet approved or cleared by the [place] FDA and has been authorized for detection and/or diagnosis of SARS-CoV-2 by FDA under an Emergency Use Authorization (EUA). This EUA will remain in effect (meaning this test can be used) for the duration of the COVID-19 declaration under Section 564(b)(1) of the Act, 21 U.S.C. section 360bbb-3(b)(1), unless the authorization is terminated or revoked.  Performed at [facility], [place] [address] [place], [place] [address]   Resp Panel by RT-PCR (Flu A&B, Covid) Nasopharyngeal Swab     Status: None   Collection Time: 12/06/20  3:03 PM   Specimen: Nasopharyngeal Swab; Nasopharyngeal(NP) swabs in vial transport medium  Result Value Ref Range Status   SARS Coronavirus 2 by RT PCR NEGATIVE NEGATIVE Final    Comment: (NOTE) SARS-CoV-2 target nucleic acids are NOT DETECTED.  The SARS-CoV-2 RNA is generally detectable in upper respiratory specimens during the acute phase of infection. The lowest concentration of SARS-CoV-2 viral copies this assay can detect is 138 copies/mL. A negative result does not preclude SARS-Cov-2 infection and should not be used as the sole basis for treatment or other patient management decisions. A negative result may occur with  improper specimen collection/handling, submission of specimen  other than nasopharyngeal swab, presence of viral mutation(s) within the areas targeted by this assay, and inadequate number of viral copies(<138 copies/mL). A negative result must be combined with clinical observations, patient history, and epidemiological information. The expected result is Negative.  Fact Sheet for Patients:  EntrepreneurPulse.com.au  Fact Sheet for Healthcare Providers:  IncredibleEmployment.be  This test is no t yet approved or cleared by the [place] FDA and  has been authorized for detection and/or diagnosis of SARS-CoV-2 by FDA under an Emergency Use Authorization (EUA). This EUA will remain  in effect (meaning this test can be used) for the duration of the COVID-19 declaration under Section 564(b)(1) of the Act, 21 U.S.C.section 360bbb-3(b)(1), unless the authorization is terminated  or revoked sooner.       Influenza A by PCR NEGATIVE NEGATIVE Final   Influenza B by PCR NEGATIVE NEGATIVE Final    Comment: (NOTE) The Xpert Xpress SARS-CoV-2/FLU/RSV plus assay is intended as an aid in the diagnosis of influenza from Nasopharyngeal swab specimens and should not be used as a sole basis for treatment. Nasal washings and aspirates are unacceptable for  Xpert Xpress SARS-CoV-2/FLU/RSV testing.  Fact Sheet for Patients: EntrepreneurPulse.com.au  Fact Sheet for Healthcare Providers: IncredibleEmployment.be  This test is not yet approved or cleared by the [place] FDA and has been authorized for detection and/or diagnosis of SARS-CoV-2 by FDA under an Emergency Use Authorization (EUA). This EUA will remain in effect (meaning this test can be used) for the duration of the COVID-19 declaration under Section 564(b)(1) of the Act, 21 U.S.C. section 360bbb-3(b)(1), unless the authorization is terminated or revoked.  Performed at [facility], [place] [address], [place],  [place] [address]      Time coordinating discharge: Over 30 minutes  SIGNED:   Charlynne Cousins, MD  [facility] 12/10/2020, 8:57 AM Pager   If 7PM-7AM, please contact night-coverage [email or web address] Password [email or web address]

## 2020-12-10 NOTE — TOC Transition Note (Signed)
Transition of Care [facility]) - CM/SW Discharge Note   Patient Details  Name: [name] MRN: [id number] Date of Birth: 15-Oct-1940  Transition of Care [facility]) CM/SW Contact:  Pollie Friar, RN Phone Number: 12/10/2020, 10:22 AM   Clinical Narrative:    Patient is discharging to [facility] once paperwork is signed with the family. Pt will transport via [facility]. D/c paperwork is at the desk and bedside RN was updated.   Number for report: [phone]   Final next level of care: [place] Barriers to Discharge: No Barriers Identified   Patient Goals and CMS Choice        Discharge Placement                       Discharge Plan and Services                                     Social Determinants of Health (SDOH) Interventions     Readmission Risk Interventions Readmission Risk Prevention Plan 09/11/2020  Transportation Screening Complete  PCP or Specialist Appt within 3-5 Days Complete  HRI or [place] Complete  Social Work Consult for [place] Planning/Counseling Complete  Palliative Care Screening Not Applicable  Medication Review [employer] photographer) Complete  Some recent data might be hidden

## 2020-12-10 NOTE — Progress Notes (Addendum)
Husband stayed with her tonight and she seems to be calmer.  This nurse peeked in and they were asleep.  Her husband said to please leave her alone if she is asleep and he will let us know if she needs our assistance.  No signs of distress.  They both are sleeping at this time.

## 2020-12-11 ENCOUNTER — Telehealth: Payer: Self-pay | Admitting: Licensed Clinical Social Worker

## 2020-12-12 NOTE — Telephone Encounter (Signed)
Patients husband(Frank) left a message on the HIM line requesting a call back.  Routing to [facility] for follow up.

## 2020-12-13 DIAGNOSIS — R188 Other ascites: Secondary | ICD-10-CM | POA: Diagnosis not present

## 2020-12-13 DIAGNOSIS — C561 Malignant neoplasm of right ovary: Secondary | ICD-10-CM | POA: Diagnosis not present

## 2020-12-16 ENCOUNTER — Encounter: Payer: Self-pay | Admitting: Hematology and Oncology

## 2020-12-16 NOTE — Progress Notes (Signed)
Update 12/16/2020: [facility] HRD reported out as inconclusive on 12/09/2020 due to insufficient sample. BRCA1/BRCA2 tumor status was performed, however, and was negative.

## 2020-12-16 NOTE — Telephone Encounter (Signed)
Spoke with patient's husband [name] about HRD results. The HRD result came back inconclusive due to insufficient sample, but BRCA1/BRCA2 testing was performed and was normal.

## 2020-12-27 DEATH — deceased

## 2021-01-02 ENCOUNTER — Encounter (HOSPITAL_COMMUNITY): Payer: Self-pay

## 2021-01-03 ENCOUNTER — Encounter: Payer: Self-pay | Admitting: Hematology and Oncology

## 2021-04-14 ENCOUNTER — Encounter: Payer: Self-pay | Admitting: Hematology and Oncology

## 2021-04-21 ENCOUNTER — Ambulatory Visit: Payer: Medicare Other | Admitting: Cardiology

## 2021-04-21 NOTE — Progress Notes (Deleted)
Patient comfort care

## 2021-12-20 IMAGING — CR DG ABDOMEN ACUTE W/ 1V CHEST
4 series · 4 of 4 positions shown · non-contrast
Comparison: None.

CLINICAL DATA: Emesis

EXAM:
DG ABDOMEN ACUTE WITH 1 VIEW CHEST

[x chest ap]
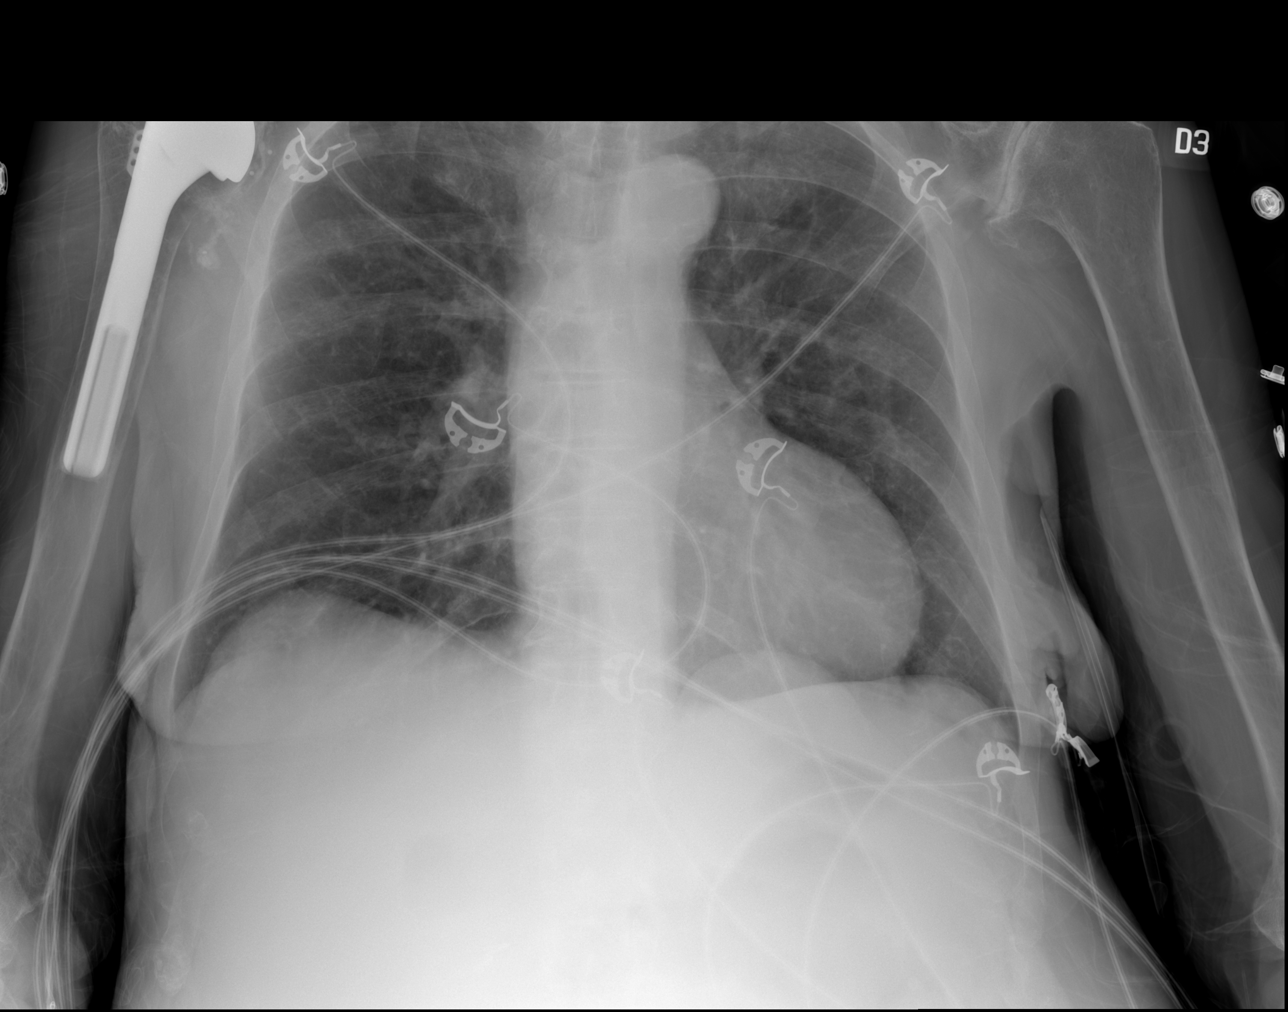

[x abdomen erect]
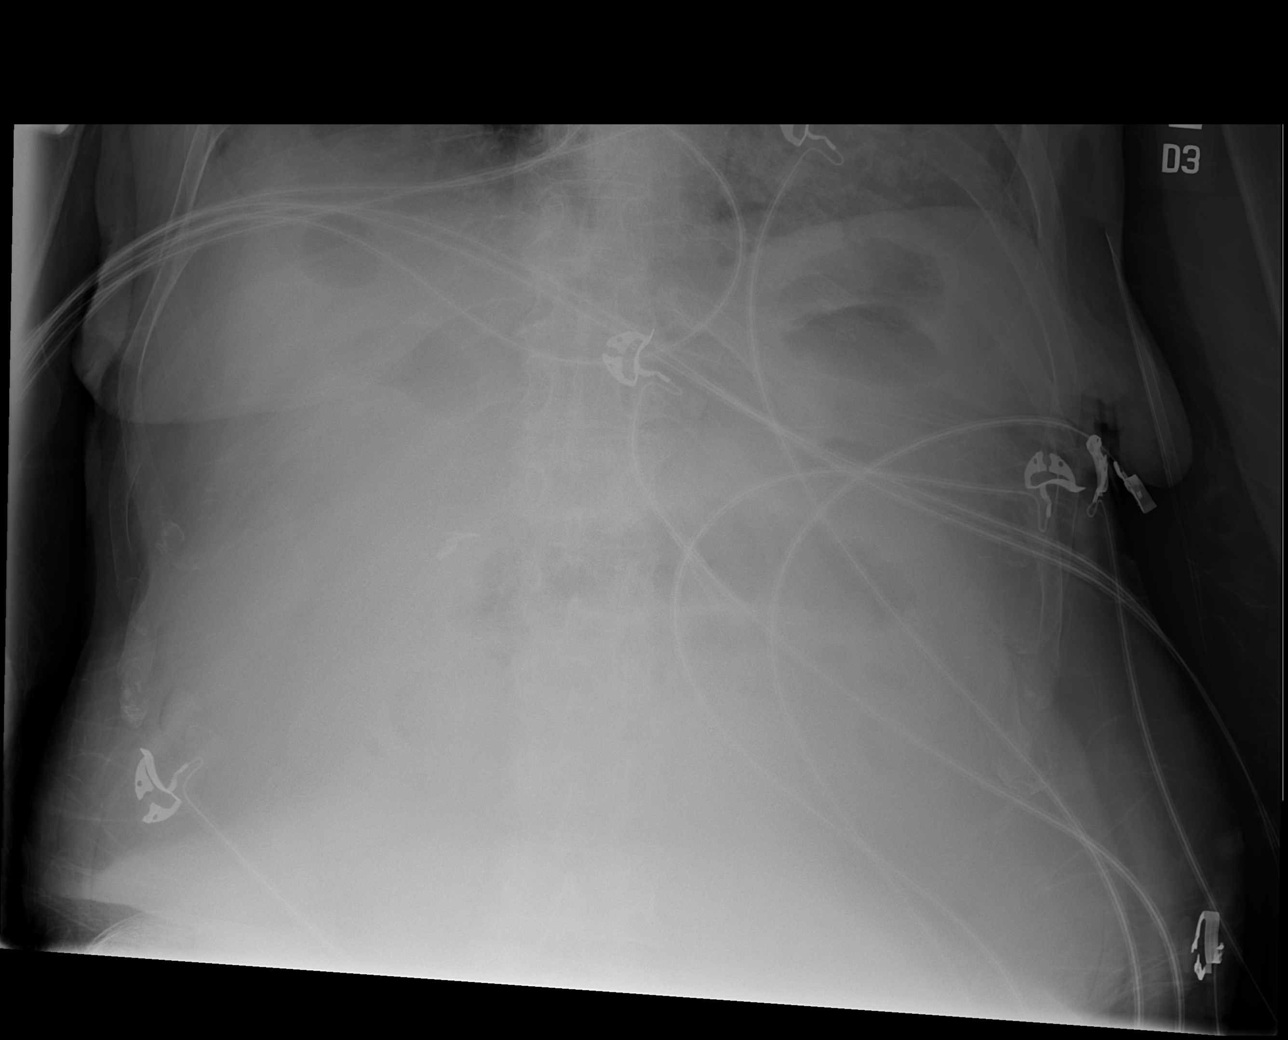

[x abdomen supine (1 of 2)]
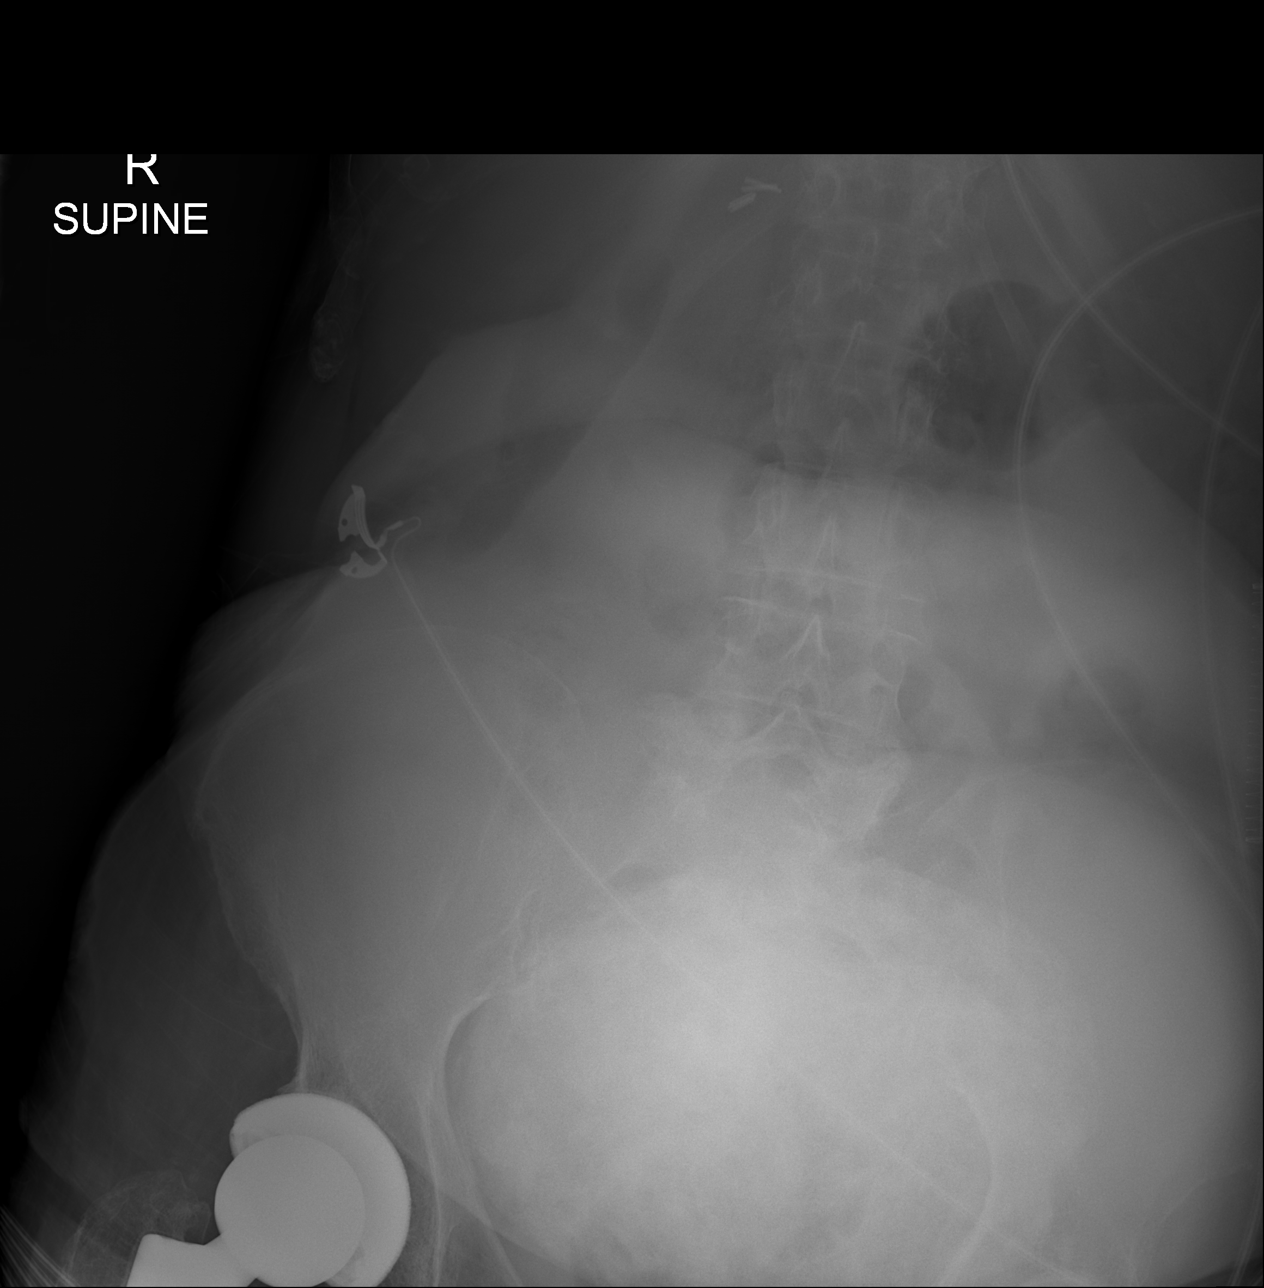

[x abdomen supine (2 of 2)]
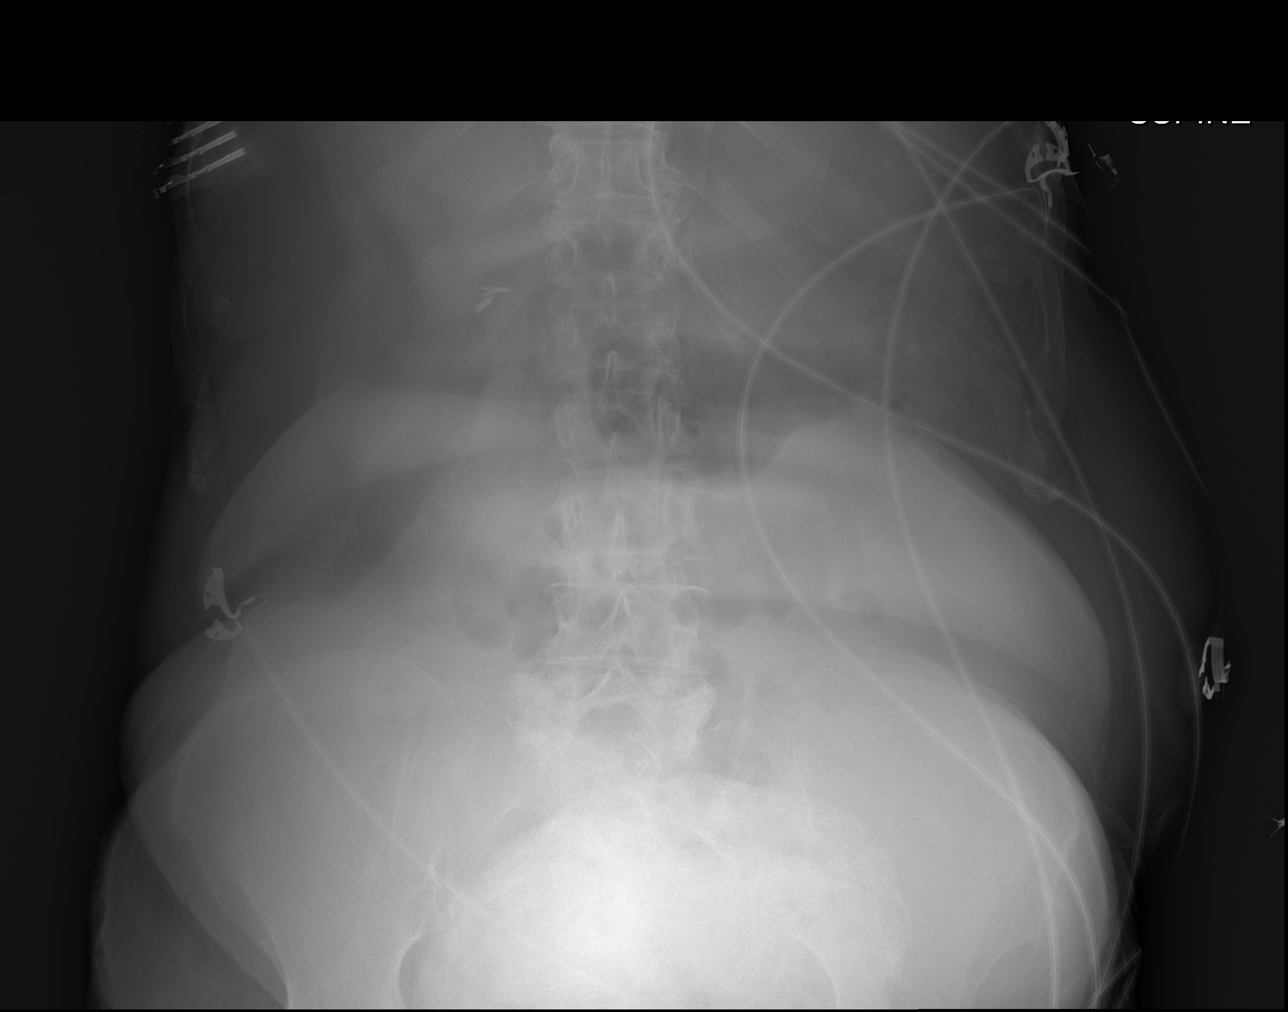

[4 of 4 positions shown; findings below may reference images not displayed]

FINDINGS: Suboptimal visualization due to body habitus. There are no
significantly dilated loops identified. No free air. No air-fluid
levels. Cholecystectomy clips. No consolidation or edema. No pleural
effusion. Heart size is normal for technique. Right shoulder
arthroplasty. Advanced left glenohumeral osteoarthritis. Right hip
arthroplasty.
IMPRESSION: Nonobstructive bowel gas pattern.

## 2021-12-21 IMAGING — US US PARACENTESIS
1 series · 3 of 3 positions shown · non-contrast
Comparison: none

INDICATION: Patient with a history of ovarian cancer and recurrent malignant
ascites. Interventional radiology asked to perform a therapeutic
paracentesis.

[Series 1: us paracentesis · 3 of 3 slices shown]
[im 1/3]
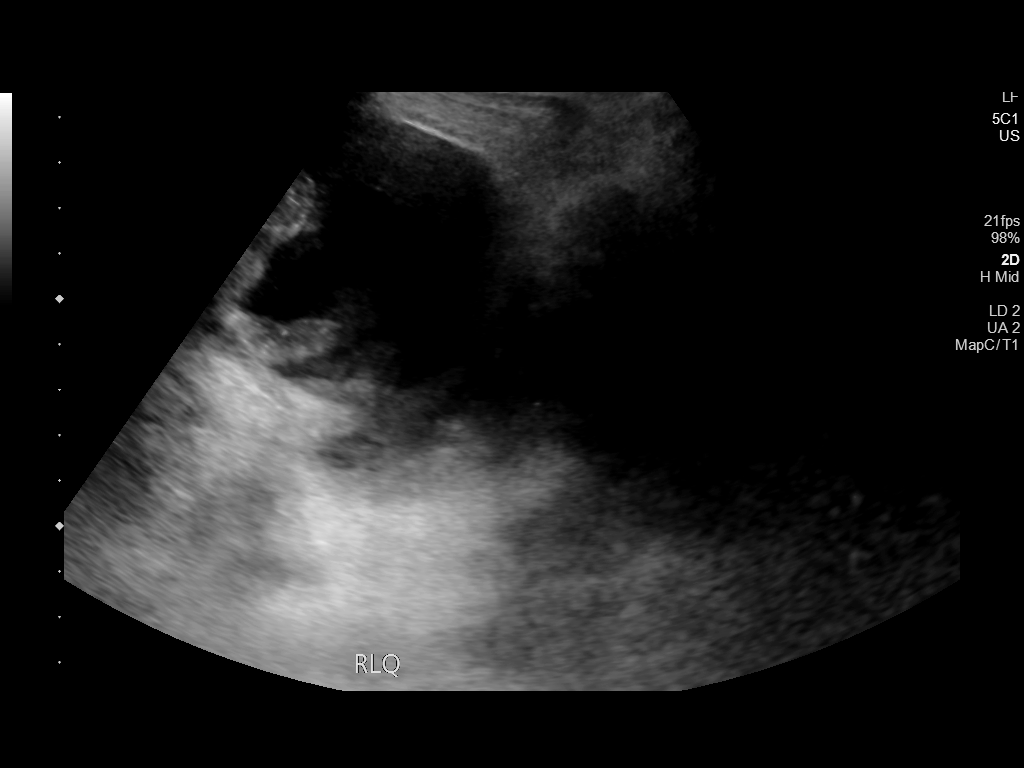
[im 2/3]
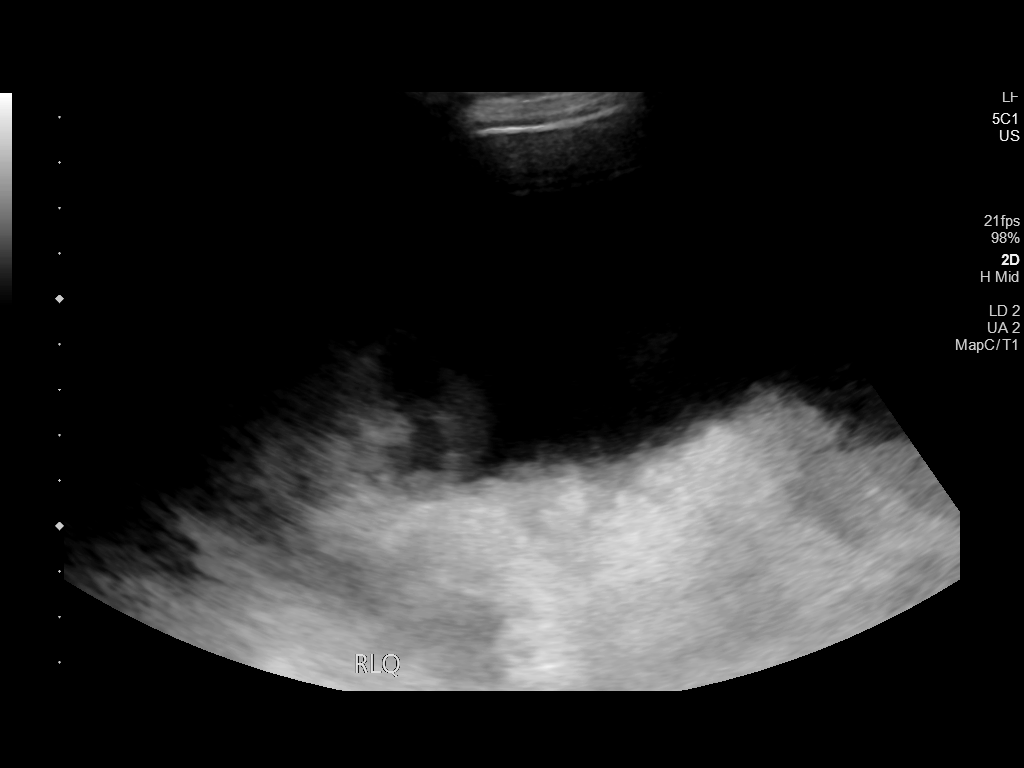
[im 3/3]
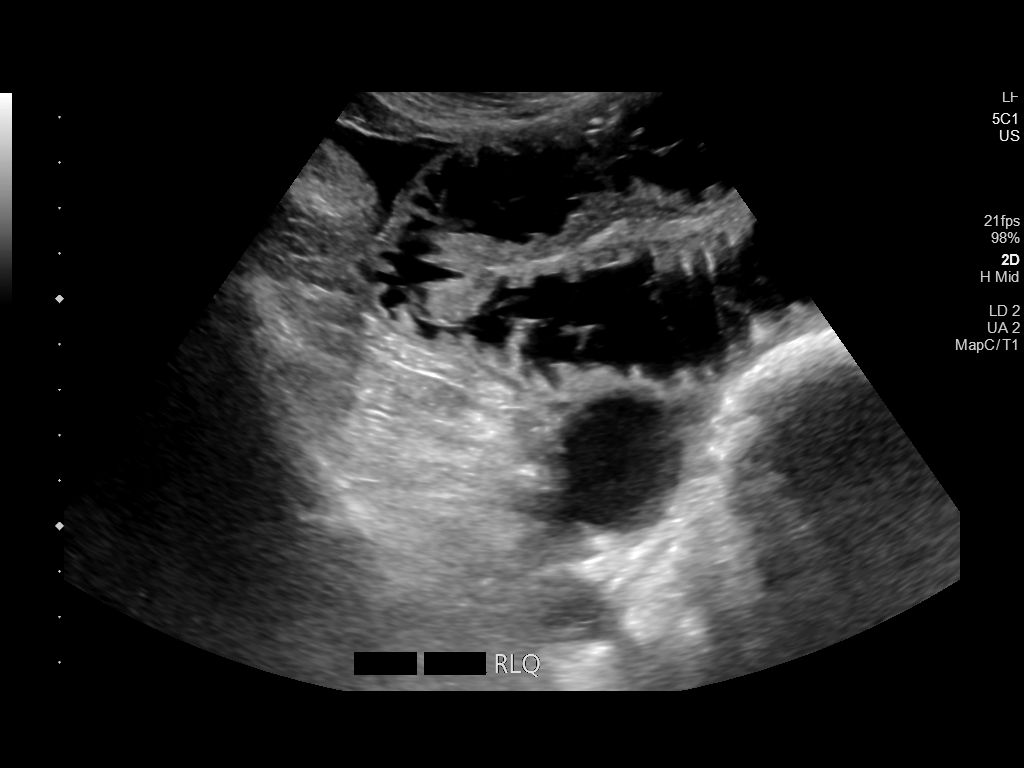

[3 of 3 positions shown; findings below may reference images not displayed]

EXAM:
ULTRASOUND GUIDED PARACENTESIS

MEDICATIONS:
1% lidocaine 10 mL

COMPLICATIONS:
None immediate.

PROCEDURE:
Informed written consent was obtained from the patient after a
discussion of the risks, benefits and alternatives to treatment. A
timeout was performed prior to the initiation of the procedure.

Initial ultrasound scanning demonstrates a large amount of ascites
within the right lower abdominal quadrant. The right lower abdomen
was prepped and draped in the usual sterile fashion. 1% lidocaine
was used for local anesthesia.

Following this, a 19 gauge, 7-cm, Yueh catheter was introduced. An
ultrasound image was saved for documentation purposes. The
paracentesis was performed. The catheter was removed and a dressing
was applied. The patient tolerated the procedure well without
immediate post procedural complication.
FINDINGS: A total of approximately 2.9 L of clear yellow fluid was removed.
IMPRESSION: Successful ultrasound-guided paracentesis yielding 2.9 liters of
peritoneal fluid. Read by: Amirsaleh Arledge, NP
